# Patient Record
Sex: Female | Born: 1950 | Race: White | Hispanic: No | Smoking: Current some day smoker
Health system: Southern US, Community
[De-identification: ages and names within clinical notes are randomized; demographics above are authoritative.]

## PROBLEM LIST (undated history)

## (undated) DIAGNOSIS — M810 Age-related osteoporosis without current pathological fracture: Secondary | ICD-10-CM

## (undated) DIAGNOSIS — M949 Disorder of cartilage, unspecified: Secondary | ICD-10-CM

## (undated) DIAGNOSIS — R05 Cough: Secondary | ICD-10-CM

## (undated) DIAGNOSIS — K219 Gastro-esophageal reflux disease without esophagitis: Secondary | ICD-10-CM

## (undated) DIAGNOSIS — K589 Irritable bowel syndrome without diarrhea: Secondary | ICD-10-CM

## (undated) DIAGNOSIS — R7302 Impaired glucose tolerance (oral): Secondary | ICD-10-CM

## (undated) DIAGNOSIS — J4489 Other specified chronic obstructive pulmonary disease: Secondary | ICD-10-CM

## (undated) DIAGNOSIS — R059 Cough, unspecified: Secondary | ICD-10-CM

## (undated) DIAGNOSIS — M899 Disorder of bone, unspecified: Secondary | ICD-10-CM

## (undated) DIAGNOSIS — J40 Bronchitis, not specified as acute or chronic: Secondary | ICD-10-CM

## (undated) DIAGNOSIS — M1990 Unspecified osteoarthritis, unspecified site: Secondary | ICD-10-CM

## (undated) DIAGNOSIS — G4733 Obstructive sleep apnea (adult) (pediatric): Secondary | ICD-10-CM

## (undated) DIAGNOSIS — Z860100 Personal history of colon polyps, unspecified: Secondary | ICD-10-CM

## (undated) DIAGNOSIS — F329 Major depressive disorder, single episode, unspecified: Secondary | ICD-10-CM

## (undated) DIAGNOSIS — J189 Pneumonia, unspecified organism: Secondary | ICD-10-CM

## (undated) DIAGNOSIS — R609 Edema, unspecified: Secondary | ICD-10-CM

## (undated) DIAGNOSIS — J309 Allergic rhinitis, unspecified: Secondary | ICD-10-CM

## (undated) DIAGNOSIS — E739 Lactose intolerance, unspecified: Secondary | ICD-10-CM

## (undated) DIAGNOSIS — E785 Hyperlipidemia, unspecified: Secondary | ICD-10-CM

## (undated) DIAGNOSIS — J45909 Unspecified asthma, uncomplicated: Secondary | ICD-10-CM

## (undated) DIAGNOSIS — F3289 Other specified depressive episodes: Secondary | ICD-10-CM

## (undated) DIAGNOSIS — M8580 Other specified disorders of bone density and structure, unspecified site: Secondary | ICD-10-CM

## (undated) DIAGNOSIS — T7840XA Allergy, unspecified, initial encounter: Secondary | ICD-10-CM

## (undated) DIAGNOSIS — F17200 Nicotine dependence, unspecified, uncomplicated: Secondary | ICD-10-CM

## (undated) DIAGNOSIS — H269 Unspecified cataract: Secondary | ICD-10-CM

## (undated) DIAGNOSIS — T8859XA Other complications of anesthesia, initial encounter: Secondary | ICD-10-CM

## (undated) DIAGNOSIS — G4730 Sleep apnea, unspecified: Secondary | ICD-10-CM

## (undated) DIAGNOSIS — K760 Fatty (change of) liver, not elsewhere classified: Secondary | ICD-10-CM

## (undated) DIAGNOSIS — I10 Essential (primary) hypertension: Secondary | ICD-10-CM

## (undated) DIAGNOSIS — R0600 Dyspnea, unspecified: Secondary | ICD-10-CM

## (undated) DIAGNOSIS — F411 Generalized anxiety disorder: Secondary | ICD-10-CM

## (undated) DIAGNOSIS — J449 Chronic obstructive pulmonary disease, unspecified: Secondary | ICD-10-CM

## (undated) HISTORY — DX: Disorder of bone, unspecified: M89.9

## (undated) HISTORY — DX: Essential (primary) hypertension: I10

## (undated) HISTORY — DX: Unspecified asthma, uncomplicated: J45.909

## (undated) HISTORY — DX: Allergic rhinitis, unspecified: J30.9

## (undated) HISTORY — DX: Allergy, unspecified, initial encounter: T78.40XA

## (undated) HISTORY — DX: Impaired glucose tolerance (oral): R73.02

## (undated) HISTORY — PX: OTHER SURGICAL HISTORY: SHX169

## (undated) HISTORY — DX: Hyperlipidemia, unspecified: E78.5

## (undated) HISTORY — DX: Edema, unspecified: R60.9

## (undated) HISTORY — DX: Major depressive disorder, single episode, unspecified: F32.9

## (undated) HISTORY — DX: Other specified chronic obstructive pulmonary disease: J44.89

## (undated) HISTORY — DX: Cough, unspecified: R05.9

## (undated) HISTORY — DX: Gastro-esophageal reflux disease without esophagitis: K21.9

## (undated) HISTORY — DX: Lactose intolerance, unspecified: E73.9

## (undated) HISTORY — DX: Other specified disorders of bone density and structure, unspecified site: M85.80

## (undated) HISTORY — DX: Disorder of bone, unspecified: M94.9

## (undated) HISTORY — DX: Pneumonia, unspecified organism: J18.9

## (undated) HISTORY — DX: Fatty (change of) liver, not elsewhere classified: K76.0

## (undated) HISTORY — DX: Nicotine dependence, unspecified, uncomplicated: F17.200

## (undated) HISTORY — DX: Generalized anxiety disorder: F41.1

## (undated) HISTORY — PX: CHOLECYSTECTOMY: SHX55

## (undated) HISTORY — DX: Obstructive sleep apnea (adult) (pediatric): G47.33

## (undated) HISTORY — DX: Unspecified osteoarthritis, unspecified site: M19.90

## (undated) HISTORY — DX: Bronchitis, not specified as acute or chronic: J40

## (undated) HISTORY — DX: Other specified depressive episodes: F32.89

## (undated) HISTORY — DX: Cough: R05

## (undated) HISTORY — PX: COLONOSCOPY: SHX174

## (undated) HISTORY — DX: Chronic obstructive pulmonary disease, unspecified: J44.9

## (undated) HISTORY — PX: WISDOM TOOTH EXTRACTION: SHX21

## (undated) HISTORY — DX: Irritable bowel syndrome, unspecified: K58.9

## (undated) HISTORY — DX: Personal history of colon polyps, unspecified: Z86.0100

## (undated) HISTORY — DX: Unspecified cataract: H26.9

## (undated) HISTORY — DX: Personal history of colonic polyps: Z86.010

## (undated) HISTORY — DX: Age-related osteoporosis without current pathological fracture: M81.0

## (undated) HISTORY — DX: Sleep apnea, unspecified: G47.30

---

## 1999-04-04 ENCOUNTER — Other Ambulatory Visit: Admission: RE | Admit: 1999-04-04 | Discharge: 1999-04-04 | Payer: Self-pay | Admitting: *Deleted

## 2000-04-10 ENCOUNTER — Other Ambulatory Visit: Admission: RE | Admit: 2000-04-10 | Discharge: 2000-04-10 | Payer: Self-pay | Admitting: *Deleted

## 2004-12-10 ENCOUNTER — Ambulatory Visit: Payer: Self-pay | Admitting: Internal Medicine

## 2005-03-20 ENCOUNTER — Ambulatory Visit: Payer: Self-pay | Admitting: Internal Medicine

## 2005-04-04 ENCOUNTER — Ambulatory Visit: Payer: Self-pay | Admitting: Internal Medicine

## 2005-04-08 ENCOUNTER — Ambulatory Visit (HOSPITAL_COMMUNITY): Admission: RE | Admit: 2005-04-08 | Discharge: 2005-04-08 | Payer: Self-pay | Admitting: Internal Medicine

## 2005-04-10 ENCOUNTER — Ambulatory Visit: Payer: Self-pay | Admitting: Internal Medicine

## 2005-11-06 ENCOUNTER — Ambulatory Visit: Payer: Self-pay | Admitting: Internal Medicine

## 2007-07-23 LAB — HM COLONOSCOPY

## 2007-08-05 ENCOUNTER — Encounter: Admission: RE | Admit: 2007-08-05 | Discharge: 2007-08-05 | Payer: Self-pay | Admitting: Obstetrics and Gynecology

## 2007-08-06 ENCOUNTER — Ambulatory Visit: Payer: Self-pay | Admitting: Internal Medicine

## 2007-08-19 ENCOUNTER — Ambulatory Visit: Payer: Self-pay | Admitting: Internal Medicine

## 2007-08-19 ENCOUNTER — Encounter: Payer: Self-pay | Admitting: Internal Medicine

## 2007-08-25 ENCOUNTER — Encounter: Admission: RE | Admit: 2007-08-25 | Discharge: 2007-08-25 | Payer: Self-pay | Admitting: Obstetrics and Gynecology

## 2007-10-06 ENCOUNTER — Encounter: Payer: Self-pay | Admitting: Internal Medicine

## 2007-12-13 ENCOUNTER — Encounter: Payer: Self-pay | Admitting: Internal Medicine

## 2007-12-13 ENCOUNTER — Ambulatory Visit: Payer: Self-pay | Admitting: Internal Medicine

## 2007-12-13 DIAGNOSIS — Z87891 Personal history of nicotine dependence: Secondary | ICD-10-CM | POA: Insufficient documentation

## 2007-12-13 DIAGNOSIS — J189 Pneumonia, unspecified organism: Secondary | ICD-10-CM | POA: Insufficient documentation

## 2007-12-13 DIAGNOSIS — R059 Cough, unspecified: Secondary | ICD-10-CM | POA: Insufficient documentation

## 2007-12-13 DIAGNOSIS — R05 Cough: Secondary | ICD-10-CM

## 2007-12-13 DIAGNOSIS — J449 Chronic obstructive pulmonary disease, unspecified: Secondary | ICD-10-CM | POA: Insufficient documentation

## 2007-12-13 DIAGNOSIS — F17200 Nicotine dependence, unspecified, uncomplicated: Secondary | ICD-10-CM | POA: Insufficient documentation

## 2007-12-21 ENCOUNTER — Telehealth (INDEPENDENT_AMBULATORY_CARE_PROVIDER_SITE_OTHER): Payer: Self-pay | Admitting: *Deleted

## 2008-05-30 ENCOUNTER — Telehealth: Payer: Self-pay | Admitting: Internal Medicine

## 2008-06-16 ENCOUNTER — Ambulatory Visit: Payer: Self-pay | Admitting: Internal Medicine

## 2008-06-16 ENCOUNTER — Telehealth: Payer: Self-pay | Admitting: Internal Medicine

## 2009-11-22 ENCOUNTER — Encounter: Payer: Self-pay | Admitting: Internal Medicine

## 2009-12-12 ENCOUNTER — Ambulatory Visit: Payer: Self-pay | Admitting: Internal Medicine

## 2009-12-12 LAB — CONVERTED CEMR LAB
AST: 28 units/L (ref 0–37)
BUN: 16 mg/dL (ref 6–23)
Basophils Relative: 0.7 % (ref 0.0–3.0)
Calcium: 9.4 mg/dL (ref 8.4–10.5)
Chloride: 102 meq/L (ref 96–112)
Cholesterol: 204 mg/dL — ABNORMAL HIGH (ref 0–200)
Direct LDL: 88.8 mg/dL
Glucose, Bld: 94 mg/dL (ref 70–99)
HDL: 106.8 mg/dL (ref >39.00)
Leukocytes, UA: NEGATIVE
Lymphocytes Relative: 29.6 % (ref 12.0–46.0)
Lymphs Abs: 2 10*3/uL (ref 0.7–4.0)
MCHC: 33.8 g/dL (ref 30.0–36.0)
Neutro Abs: 3.8 10*3/uL (ref 1.4–7.7)
Neutrophils Relative %: 56.2 % (ref 43.0–77.0)
Nitrite: NEGATIVE
Sodium: 142 meq/L (ref 135–145)
TSH: 0.92 microintl units/mL (ref 0.35–5.50)
Total CHOL/HDL Ratio: 2
Total Protein, Urine: NEGATIVE mg/dL
VLDL: 7.2 mg/dL (ref 0.0–40.0)
WBC: 6.7 10*3/uL (ref 4.5–10.5)

## 2009-12-20 ENCOUNTER — Ambulatory Visit: Payer: Self-pay | Admitting: Internal Medicine

## 2009-12-20 DIAGNOSIS — I10 Essential (primary) hypertension: Secondary | ICD-10-CM | POA: Insufficient documentation

## 2009-12-20 DIAGNOSIS — M899 Disorder of bone, unspecified: Secondary | ICD-10-CM | POA: Insufficient documentation

## 2009-12-20 DIAGNOSIS — F329 Major depressive disorder, single episode, unspecified: Secondary | ICD-10-CM

## 2009-12-20 DIAGNOSIS — F32A Depression, unspecified: Secondary | ICD-10-CM | POA: Insufficient documentation

## 2009-12-20 DIAGNOSIS — R609 Edema, unspecified: Secondary | ICD-10-CM | POA: Insufficient documentation

## 2009-12-20 DIAGNOSIS — J3089 Other allergic rhinitis: Secondary | ICD-10-CM | POA: Insufficient documentation

## 2009-12-20 DIAGNOSIS — M949 Disorder of cartilage, unspecified: Secondary | ICD-10-CM

## 2009-12-20 DIAGNOSIS — K589 Irritable bowel syndrome without diarrhea: Secondary | ICD-10-CM | POA: Insufficient documentation

## 2009-12-20 DIAGNOSIS — Z860100 Personal history of colon polyps, unspecified: Secondary | ICD-10-CM | POA: Insufficient documentation

## 2009-12-20 DIAGNOSIS — K219 Gastro-esophageal reflux disease without esophagitis: Secondary | ICD-10-CM | POA: Insufficient documentation

## 2009-12-20 DIAGNOSIS — E739 Lactose intolerance, unspecified: Secondary | ICD-10-CM | POA: Insufficient documentation

## 2009-12-20 DIAGNOSIS — F411 Generalized anxiety disorder: Secondary | ICD-10-CM | POA: Insufficient documentation

## 2010-11-21 NOTE — Assessment & Plan Note (Signed)
Summary: CPX / NWS  #   Vital Signs:  Patient profile:   60 year old [name] Height:      64 inches Weight:      204.50 pounds BMI:     35.23 O2 Sat:      98 % on Room air Temp:     97.7 degrees F oral Pulse rate:   101 / minute BP sitting:   148 / 90  (left arm) Cuff size:   large  Vitals Entered ByShirlean Mylar Ewing (December 20, 2009 8:49 AM)  O2 Flow:  Room air  Preventive Care Screening  Colonoscopy:    Date:  07/23/2007    Next Due:  07/2012    Results:  Adenomatous Polyp   Pap Smear:    Date:  12/19/2009    Results:  normal   Mammogram:    Date:  11/20/2009    Results:  normal   Last Tetanus Booster:    Date:  12/21/2006    Results:  Historical   Bone Density:    Date:  04/16/2005    Results:  abnormal std dev     decliens flu shot  CC: Adult physical/RE   CC:  Adult physical/RE.  History of Present Illness: Bp has been eleveted recently , 140/98 at GYN yesterday;  c/o marked increased anxiety over work stress and ? losing job ;  denies significant depressive symptoms or suicidal ideation, has near panic but no full blown attack yet;  symptomatic most days of the wk and goes into work on sundays to prepare for the wk;  has problems with constant worry, and some sleep dysfuntion mild as well;  had OSA testing approx 5 yrs ago - neg overall;  has gained some wt  overall seen last about 2 yrs per dr plotnikov dx and tx with pna;  the very same day she found her hsband home deceased from heart disease at home;  wants to quit smoking but hard with anxiety at this point , but willing to consider chantix or other;  Pt denies CP, sob, doe, wheezing, orthopnea, pnd, worsening LE edema, palps, dizziness or syncope   Pt denies new neuro symptoms such as headache, facial or extremity weakness.  Wt up from 189 to 204 since feb 2009, mostlyk due to diet change, now eating different since husband died.     Preventive Screening-Counseling & Management  Alcohol-Tobacco     Smoking  Status: current      Drug Use:  no.    Problems Prior to Update: 1)  Osteopenia  (ICD-733.90) 2)  Glucose Intolerance  (ICD-271.3) 3)  Allergic Rhinitis  (ICD-477.9) 4)  Colonic Polyps, Hx of  (ICD-V12.72) 5)  Depression  (ICD-311) 6)  Ibs  (ICD-564.1) 7)  Gerd  (ICD-530.81) 8)  Peripheral Edema  (ICD-782.3) 9)  Preventive Health Care  (ICD-V70.0) 10)  Hypertension  (ICD-401.9) 11)  Anxiety  (ICD-300.00) 12)  Tobacco Use Disorder/smoker-smoking Cessation Discussed  (ICD-305.1) 13)  COPD  (ICD-496) 14)  Pneumonia, Organism Unspecified  (ICD-486) 15)  Cough  (ICD-786.2)  Medications Prior to Update: 1)  Promethazine-Codeine 6.25-10 Mg/57m  Syrp (Promethazine-Codeine) .... 5-10 Ml Qid As Needed Cough 2)  Albuterol Sulfate 4 Mg  Tb12 (Albuterol Sulfate) ..Marland Kitchen. 1 By Mouth Two Times A Day For Wheezing 3)  Alprazolam 0.5 Mg  Tabs (Alprazolam) ..Marland Kitchen. 1 -2 By Mouth At Bedtime Prn  Current Medications (verified): 1)  Alprazolam 0.5 Mg  Tabs (Alprazolam) ..Marland Kitchen. 1 -2 By  Mouth At Bedtime As Needed 2)  Calcium Carbonate 1500 Mg Tabs (Calcium Carbonate) .Marland Kitchen.. 1 By Mouth Once Daily 3)  Vitamin D 1000 Unit Tabs (Cholecalciferol) .Marland Kitchen.. 1 By Mouth Once Daily 4)  Glucosamine 500 Mg Caps (Glucosamine Sulfate) .Marland Kitchen.. 1 By Mouth Once Daily 5)  Aspir-Low 81 Mg Tbec (Aspirin) .Marland Kitchen.. 1 By Mouth Once Daily 6)  Sertraline Hcl 100 Mg Tabs (Sertraline Hcl) .Marland Kitchen.. 1po Once Daily 7)  Chantix Starting Month Pak 0.5 Mg X 11 & 1 Mg X 42 Tabs (Varenicline Tartrate) .... Use Asd 1 By Mouth Once Daily 8)  Chantix Continuing Month Pak 1 Mg Tabs (Varenicline Tartrate) .... Use Asd 1 By Mouth Once Daily 9)  Hydrochlorothiazide 12.5 Mg Caps (Hydrochlorothiazide) .Marland Kitchen.. 1po Once Daily  Allergies (verified): 1)  ! 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 Family History of Anxiety 2 sister with breast cancer 4 family with hypothyroidism Elevated cholesterol HTN DM  Social History: Reviewed history from 12/13/2007 and no changes required. 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         all otherwise negative per pt -  Physical Exam  General:  alert and overweight-appearing.   Head:  normocephalic and atraumatic.   Eyes:  vision grossly intact, pupils equal, and pupils round.   Ears:  R ear normal and L ear normal.   Nose:  no external deformity and no nasal discharge.   Mouth:  no gingival abnormalities and pharynx pink and moist.   Neck:  supple and no masses.   Lungs:  normal respiratory effort and normal breath sounds.   Heart:  normal rate and regular rhythm.   Abdomen:  soft, non-tender, and normal bowel sounds.   Msk:  no joint tenderness and no joint  swelling.   Extremities:  trace edema bilat, no erythema Neurologic:  cranial nerves II-XII intact and strength normal in all extremities.   Skin:  color normal and no rashes.   Psych:  not depressed appearing and moderately anxious.     Impression & Recommendations:  Problem # 1:  Preventive Health Care (ICD-V70.0)  Overall doing well, age appropriate education and counseling updated and referral for appropriate preventive services done unless declined, immunizations up to date or declined, diet counseling done if overweight, urged to quit smoking if smokes , most recent labs reviewed and current ordered if appropriate, ecg reviewed or declined (interpretation per ECG scanned in the EMR if done); information regarding Medicare Prevention requirements given if appropriate   Orders: EKG w/ Interpretation (93000)  Problem #  2:  ANXIETY (ICD-300.00)  Her updated medication list for this problem includes:    Alprazolam 0.5 Mg Tabs (Alprazolam) .Marland Kitchen... 1 -2 by mouth at bedtime as needed    Sertraline Hcl 100 Mg Tabs (Sertraline hcl) .Marland Kitchen... 1po once daily treat as above, f/u any worsening signs or symptoms   Problem # 3:  TOBACCO USE DISORDER/SMOKER-SMOKING CESSATION DISCUSSED (ICD-305.1)  Her updated medication list for this problem includes:    Chantix Starting Month Pak 0.5 Mg X 11 & 1 Mg X 42 Tabs (Varenicline tartrate) ..... Use asd 1 by mouth once daily    Chantix Continuing Month Pak 1 Mg Tabs (Varenicline tartrate) ..... Use asd 1 by mouth once daily treat as above, f/u any worsening signs or symptoms   Problem # 4:  HYPERTENSION (ICD-401.9)  Her updated medication list for this problem includes:    Hydrochlorothiazide 12.5 Mg Caps (Hydrochlorothiazide) .Marland Kitchen... 1po once daily  BP today: 148/90 Prior BP: 125/75 (12/13/2007)  Labs Reviewed: K+: 4.3 (12/12/2009) Creat: : 0.5 (12/12/2009)   Chol: 204 (12/12/2009)   HDL: 106.80 (12/12/2009)   TG: 36.0 (12/12/2009) treat as above, f/u  any worsening signs or symptoms   Problem # 5:  PERIPHERAL EDEMA (ICD-782.3)  Her updated medication list for this problem includes:    Hydrochlorothiazide 12.5 Mg Caps (Hydrochlorothiazide) .Marland Kitchen... 1po once daily trace today, ongoing for months to years - suspect due to venous insuff - treat as above, f/u any worsening signs or symptoms   Complete Medication List: 1)  Alprazolam 0.5 Mg Tabs (Alprazolam) .Marland Kitchen.. 1 -2 by mouth at bedtime as needed 2)  Calcium Carbonate 1500 Mg Tabs (Calcium carbonate) .Marland Kitchen.. 1 by mouth once daily 3)  Vitamin D 1000 Unit Tabs (Cholecalciferol) .Marland Kitchen.. 1 by mouth once daily 4)  Glucosamine 500 Mg Caps (Glucosamine sulfate) .Marland Kitchen.. 1 by mouth once daily 5)  Aspir-low 81 Mg Tbec (Aspirin) .Marland Kitchen.. 1 by mouth once daily 6)  Sertraline Hcl 100 Mg Tabs (Sertraline hcl) .Marland Kitchen.. 1po once daily 7)  Chantix Starting Month Pak 0.5 Mg X 11 & 1 Mg X 42 Tabs (Varenicline tartrate) .... Use asd 1 by mouth once daily 8)  Chantix Continuing Month Pak 1 Mg Tabs (Varenicline tartrate) .... Use asd 1 by mouth once daily 9)  Hydrochlorothiazide 12.5 Mg Caps (Hydrochlorothiazide) .Marland Kitchen.. 1po once daily  Patient Instructions: 1)  Please take all new medications as prescribed - start the sertraline at HALF pill for 3 days, then whole pill per day after that 2)  Continue all previous medications as before this visit  3)  I checked your chart - the next colonoscopy is due at 5 yrs (2013) 4)  Please schedule a follow-up appointment in 1 year or sooner if needed Prescriptions: HYDROCHLOROTHIAZIDE 12.5 MG CAPS (HYDROCHLOROTHIAZIDE) 1po once daily  #90 x 3   Entered and Authorized by:   Biagio Borg MD   Signed by:   Biagio Borg MD on 12/20/2009   Method used:   Electronically to        [facility]  [phone]* (retail)       [place], [place]  [address]       Ph: [phone] or [phone]       Fax: [phone]   RxID:   [id number] CHANTIX CONTINUING MONTH PAK 1 MG TABS  (VARENICLINE TARTRATE) use asd 1 by mouth once daily  #1pk x 1   Entered and Authorized by:  Biagio Borg MD   Signed by:   Biagio Borg MD on 12/20/2009   Method used:   Print then Give to Patient   RxID:   [id number] [place] PAK 0.5 MG X 11 & 1 MG X 42 TABS (VARENICLINE TARTRATE) use asd 1 by mouth once daily  #1pk x 0   Entered and Authorized by:   Biagio Borg MD   Signed by:   Biagio Borg MD on 12/20/2009   Method used:   Print then Give to Patient   RxID:   [id number] ALPRAZOLAM 0.5 MG  TABS (ALPRAZOLAM) 1 -2 by mouth at bedtime as needed  #60 x 2   Entered and Authorized by:   Biagio Borg MD   Signed by:   Biagio Borg MD on 12/20/2009   Method used:   Print then Give to Patient   RxID:   [id number] SERTRALINE HCL 100 MG TABS (SERTRALINE HCL) 1po once daily  #30 x 11   Entered and Authorized by:   Biagio Borg MD   Signed by:   Biagio Borg MD on 12/20/2009   Method used:   Electronically to        [facility]  [phone]* (retail)       [place], [place]  [address]       Ph: [phone] or [phone]       Fax: [phone]   RxID:   [id number]    Immunization History:  Tetanus/Td Immunization History:    Tetanus/Td:  historical (12/21/2006)

## 2010-12-26 ENCOUNTER — Other Ambulatory Visit: Payer: Self-pay | Admitting: Obstetrics and Gynecology

## 2010-12-30 ENCOUNTER — Telehealth: Payer: Self-pay | Admitting: Internal Medicine

## 2011-01-07 NOTE — Progress Notes (Signed)
Summary: elevated BP  Phone Note Call from Patient Call back at [facility] Phone [phone]   Summary of Call: Pt called stating her BP has been elevated lately 130/85, 167/102, 122/90. Pt had similar elevated reading at GYN and was advised to discuss with [facility] the possiblity of changing or adding to HCTZ. Please advise. Initial call taken by: Crissie Sickles, CMA,  December 30, 2010 1:50 PM  Follow-up for Phone Call        ok to change to lisinopril HCT  10/12.5 - 1 per day - done per emr  to return for LAB only in 2 wks:  bmet  :401.1 Follow-up by: Biagio Borg MD,  December 30, 2010 5:09 PM  Additional Follow-up for Phone Call Additional follow up Details #1::        called pt. left msg. to call back Additional Follow-up by: Robin Ewing CMA Deborra Medina),  December 31, 2010 8:39 AM    Additional Follow-up for Phone Call Additional follow up Details #2::    called pt informed of above information and scheduled pt. for labs 01/14/2011 Follow-up by: Robin Ewing CMA Deborra Medina),  December 31, 2010 8:48 AM  New/Updated Medications: LISINOPRIL-HYDROCHLOROTHIAZIDE 10-12.5 MG TABS (LISINOPRIL-HYDROCHLOROTHIAZIDE) 1 by mouth once daily Prescriptions: LISINOPRIL-HYDROCHLOROTHIAZIDE 10-12.5 MG TABS (LISINOPRIL-HYDROCHLOROTHIAZIDE) 1 by mouth once daily  #90 x 3   Entered and Authorized by:   Biagio Borg MD   Signed by:   Biagio Borg MD on 12/30/2010   Method used:   Electronically to        [facility]  [phone]* (retail)       [place], [place]  [address]       Ph: [phone] or [phone]       Fax: [phone]   RxID:   [id number] LISINOPRIL-HYDROCHLOROTHIAZIDE 10-12.5 MG TABS (LISINOPRIL-HYDROCHLOROTHIAZIDE) 1 by mouth once daily  #90 x 3   Entered and Authorized by:   Biagio Borg MD   Signed by:   Biagio Borg MD on 12/30/2010   Method used:   Print then Give to Patient   RxID:   [id number]

## 2011-01-14 ENCOUNTER — Other Ambulatory Visit (INDEPENDENT_AMBULATORY_CARE_PROVIDER_SITE_OTHER): Payer: Self-pay

## 2011-01-14 DIAGNOSIS — I10 Essential (primary) hypertension: Secondary | ICD-10-CM

## 2011-01-14 LAB — BASIC METABOLIC PANEL
CO2: 30 mEq/L (ref 19–32)
Calcium: 9.5 mg/dL (ref 8.4–10.5)
Chloride: 101 mEq/L (ref 96–112)
Potassium: 4.8 mEq/L (ref 3.5–5.1)

## 2011-03-27 ENCOUNTER — Other Ambulatory Visit (INDEPENDENT_AMBULATORY_CARE_PROVIDER_SITE_OTHER): Payer: PRIVATE HEALTH INSURANCE

## 2011-03-27 ENCOUNTER — Encounter: Payer: Self-pay | Admitting: Internal Medicine

## 2011-03-27 ENCOUNTER — Ambulatory Visit (INDEPENDENT_AMBULATORY_CARE_PROVIDER_SITE_OTHER): Payer: PRIVATE HEALTH INSURANCE | Admitting: Internal Medicine

## 2011-03-27 VITALS — BP 142/98 | HR 100 | Temp 100.0°F | Ht 64.0 in | Wt 210.0 lb

## 2011-03-27 DIAGNOSIS — Z0000 Encounter for general adult medical examination without abnormal findings: Secondary | ICD-10-CM

## 2011-03-27 DIAGNOSIS — Z0001 Encounter for general adult medical examination with abnormal findings: Secondary | ICD-10-CM | POA: Insufficient documentation

## 2011-03-27 DIAGNOSIS — R7309 Other abnormal glucose: Secondary | ICD-10-CM

## 2011-03-27 DIAGNOSIS — R7302 Impaired glucose tolerance (oral): Secondary | ICD-10-CM | POA: Insufficient documentation

## 2011-03-27 DIAGNOSIS — I10 Essential (primary) hypertension: Secondary | ICD-10-CM

## 2011-03-27 LAB — HEPATIC FUNCTION PANEL
ALT: 53 U/L — ABNORMAL HIGH (ref 0–35)
AST: 42 U/L — ABNORMAL HIGH (ref 0–37)
Albumin: 4.3 g/dL (ref 3.5–5.2)
Total Protein: 7.4 g/dL (ref 6.0–8.3)

## 2011-03-27 LAB — CBC WITH DIFFERENTIAL/PLATELET
Basophils Relative: 1 % (ref 0.0–3.0)
Eosinophils Relative: 2.2 % (ref 0.0–5.0)
HCT: 46.2 % — ABNORMAL HIGH (ref 36.0–46.0)
Hemoglobin: 16.1 g/dL — ABNORMAL HIGH (ref 12.0–15.0)
Lymphs Abs: 3 10*3/uL (ref 0.7–4.0)
MCV: 99.7 fl (ref 78.0–100.0)
Monocytes Absolute: 1 10*3/uL (ref 0.1–1.0)
Monocytes Relative: 10.8 % (ref 3.0–12.0)
Neutro Abs: 4.9 10*3/uL (ref 1.4–7.7)
Platelets: 280 10*3/uL (ref 150.0–400.0)
WBC: 9.1 10*3/uL (ref 4.5–10.5)

## 2011-03-27 LAB — BASIC METABOLIC PANEL
Calcium: 9.4 mg/dL (ref 8.4–10.5)
GFR: 119.46 mL/min (ref >60.00)
Glucose, Bld: 119 mg/dL — ABNORMAL HIGH (ref 70–99)
Potassium: 4.3 mEq/L (ref 3.5–5.1)
Sodium: 139 mEq/L (ref 135–145)

## 2011-03-27 LAB — URINALYSIS, ROUTINE W REFLEX MICROSCOPIC
Bilirubin Urine: NEGATIVE
Ketones, ur: NEGATIVE
Leukocytes, UA: NEGATIVE
Total Protein, Urine: NEGATIVE
Urobilinogen, UA: 0.2 (ref 0.0–1.0)
pH: 5.5 (ref 5.0–8.0)

## 2011-03-27 LAB — LIPID PANEL
Cholesterol: 199 mg/dL (ref 0–200)
HDL: 94.2 mg/dL (ref >39.00)
VLDL: 13 mg/dL (ref 0.0–40.0)

## 2011-03-27 LAB — HEMOGLOBIN A1C: Hgb A1c MFr Bld: 5.3 % (ref 4.6–6.5)

## 2011-03-27 MED ORDER — ALPRAZOLAM 0.5 MG PO TABS: ORAL_TABLET | ORAL | Status: DC

## 2011-03-27 MED ORDER — LOSARTAN POTASSIUM-HCTZ 100-25 MG PO TABS: 1.0000 | ORAL_TABLET | Freq: Every day | ORAL | Status: DC

## 2011-03-27 NOTE — Patient Instructions (Addendum)
Stop the HCTZ 12.5 mg per day Start the Losartan-HCT 100/25 mg - 1 per day Continue all other medications as before Please go to LAB in the Basement for the blood and/or urine tests to be done today Please call the phone number [phone] (the [place]) for results of testing in 2-3 days;  When calling, simply dial the number, and when prompted enter the MRN number above (the Medical Record Number) and the # key, then the message should start. Please return in 1 year for your yearly visit, or sooner if needed, with Lab testing done 3-5 days before

## 2011-03-27 NOTE — Progress Notes (Signed)
Subjective:    Patient ID: Crystal [name], female    DOB: 12/31/1950, 60 y.o.   MRN: [id number]  HPI Here for wellness and f/u;  Overall doing ok;  Pt denies CP, worsening SOB, DOE, wheezing, orthopnea, PND, worsening LE edema, palpitations, dizziness or syncope.  Pt denies neurological change such as new Headache, facial or extremity weakness.  Pt denies polydipsia, polyuria, or low sugar symptoms. Pt states overall good compliance with treatment and medications, good tolerability, and trying to follow lower cholesterol diet.  Pt denies worsening depressive symptoms, suicidal ideation or panic. No fever, wt loss, night sweats, loss of appetite, or other constitutional symptoms.  Pt states good ability with ADL's, low fall risk, home safety reviewed and adequate, no significant changes in hearing or vision, and occasionally active with exercise.  Has seen urology, has plan for bladder tack next mo,  And BP has been mostly on the high side, after starting then stopping the lis-hct , and going bacck to hctz 12.5 alone. Does have a BP cuff at home.  Past Medical History  Diagnosis Date  . ALLERGIC RHINITIS 12/20/2009  . ANXIETY 12/20/2009  . COLONIC POLYPS, HX OF 12/20/2009  . COPD 12/13/2007  . Cough 12/13/2007  . DEPRESSION 12/20/2009  . GERD 12/20/2009  . GLUCOSE INTOLERANCE 12/20/2009  . HYPERTENSION 12/20/2009  . IBS 12/20/2009  . OSTEOPENIA 12/20/2009  . PERIPHERAL EDEMA 12/20/2009  . Pneumonia, organism unspecified 12/13/2007  . TOBACCO USE DISORDER/SMOKER-SMOKING CESSATION DISCUSSED 12/13/2007   Past Surgical History  Procedure Date  . Cesarean section   . Cholecystectomy     reports that she has been smoking.  She does not have any smokeless tobacco history on file. She reports that she drinks alcohol. She reports that she does not use illicit drugs. family history includes Anxiety disorder in her other; Cancer in her sister; Diabetes in her other; Hyperlipidemia in her other; and Hypothyroidism in her  other. Allergies  Allergen Reactions  . Ace Inhibitors Cough  . Penicillins    Current Outpatient Prescriptions on File Prior to Visit  Medication Sig Dispense Refill  . aspirin 81 MG tablet Take 81 mg by mouth daily.        . Calcium Carbonate ([employer] CALCIUM) 1500 MG TABS Take by mouth daily.        . Cholecalciferol (VITAMIN D) 1000 UNITS capsule Take 1,000 Units by mouth daily.        . Glucosamine 500 MG CAPS Take by mouth daily.        Marland Kitchen DISCONTD: ALPRAZolam (XANAX) 0.5 MG tablet 1-2 by mouth at bedtime as needed       . DISCONTD: lisinopril-hydrochlorothiazide (PRINZIDE,ZESTORETIC) 10-12.5 MG per tablet Take 1 tablet by mouth daily.        Marland Kitchen DISCONTD: sertraline (ZOLOFT) 100 MG tablet Take 100 mg by mouth daily.        Marland Kitchen DISCONTD: varenicline (CHANTIX CONTINUING MONTH PAK) 1 MG tablet Take 1 mg by mouth daily.        Marland Kitchen DISCONTD: varenicline (CHANTIX STARTING MONTH PAK) 0.5 MG X 11 & 1 MG X 42 tablet Take by mouth daily. Take one 0.26m tablet by mouth once daily for 3 days, then increase to one 0.532mtablet twice daily for 3 days, then increase to one 38m33mablet twice daily.        Review of Systems Review of Systems  Constitutional: Negative for diaphoresis, activity change, appetite change and unexpected weight change.  HENT: Negative  for hearing loss, ear pain, facial swelling, mouth sores and neck stiffness.   Eyes: Negative for pain, redness and visual disturbance.  Respiratory: Negative for shortness of breath and wheezing.   Cardiovascular: Negative for chest pain and palpitations.  Gastrointestinal: Negative for diarrhea, blood in stool, abdominal distention and rectal pain.  Genitourinary: Negative for hematuria, flank pain and decreased urine volume.  Musculoskeletal: Negative for myalgias and joint swelling.  Skin: Negative for color change and wound.  Neurological: Negative for syncope and numbness.  Hematological: Negative for adenopathy.  Psychiatric/Behavioral:  Negative for hallucinations, self-injury, decreased concentration and agitation.       Objective:   Physical Exam BP 142/98  Pulse 100  Temp(Src) 100 F (37.8 C) (Oral)  Ht 5' 4"  (1.626 m)  Wt 210 lb (95.255 kg)  BMI 36.05 kg/m2  SpO2 96% Physical Exam  VS noted Constitutional: Pt is oriented to person, place, and time. Appears well-developed and well-nourished.  HENT:  Head: Normocephalic and atraumatic.  Right Ear: External ear normal.  Left Ear: External ear normal.  Nose: Nose normal.  Mouth/Throat: Oropharynx is clear and moist.  Eyes: Conjunctivae and EOM are normal. Pupils are equal, round, and reactive to light.  Neck: Normal range of motion. Neck supple. No JVD present. No tracheal deviation present.  Cardiovascular: Normal rate, regular rhythm, normal heart sounds and intact distal pulses.   Pulmonary/Chest: Effort normal and breath sounds normal.  Abdominal: Soft. Bowel sounds are normal. There is no tenderness.  Musculoskeletal: Normal range of motion. Exhibits trace -1+ edema bilat ankle Lymphadenopathy:  Has no cervical adenopathy.  Neurological: Pt is alert and oriented to person, place, and time. Pt has normal reflexes. No cranial nerve deficit.  Skin: Skin is warm and dry. No rash noted.  Psychiatric:  Has  normal mood and affect. Behavior is normal.         Assessment & Plan:

## 2011-03-27 NOTE — Assessment & Plan Note (Signed)
Change the hctz 12.5 to losartan hct 100/25 - 1 per day; f/u bp at home and next visit  BP Readings from Last 3 Encounters:  03/27/11 142/98  12/20/09 148/90  12/13/07 125/75

## 2011-03-27 NOTE — Assessment & Plan Note (Signed)
stable overall by hx and exam, most recent data reviewed with pt, and pt to continue medical treatment as before  Lab Results  Component Value Date   HGBA1C 5.3 03/27/2011

## 2011-03-27 NOTE — Assessment & Plan Note (Signed)

## 2011-04-24 ENCOUNTER — Ambulatory Visit (HOSPITAL_BASED_OUTPATIENT_CLINIC_OR_DEPARTMENT_OTHER)
Admission: RE | Admit: 2011-04-24 | Discharge: 2011-04-24 | Disposition: A | Discharge status: Home / Self Care | Payer: PRIVATE HEALTH INSURANCE | Source: Ambulatory Visit | Attending: Urology | Admitting: Urology

## 2011-04-24 DIAGNOSIS — N393 Stress incontinence (female) (male): Secondary | ICD-10-CM | POA: Insufficient documentation

## 2011-04-24 DIAGNOSIS — F17200 Nicotine dependence, unspecified, uncomplicated: Secondary | ICD-10-CM | POA: Insufficient documentation

## 2011-04-24 DIAGNOSIS — Z01810 Encounter for preprocedural cardiovascular examination: Secondary | ICD-10-CM | POA: Insufficient documentation

## 2011-04-24 DIAGNOSIS — Z79899 Other long term (current) drug therapy: Secondary | ICD-10-CM | POA: Insufficient documentation

## 2011-04-24 DIAGNOSIS — Z01812 Encounter for preprocedural laboratory examination: Secondary | ICD-10-CM | POA: Insufficient documentation

## 2011-04-24 LAB — BASIC METABOLIC PANEL
GFR calc Af Amer: >60 mL/min (ref >60)
GFR calc non Af Amer: >60 mL/min (ref >60)
Potassium: 3.4 mEq/L — ABNORMAL LOW (ref 3.5–5.1)
Sodium: 136 mEq/L (ref 135–145)

## 2011-04-24 LAB — CBC
HCT: 44.9 % (ref 36.0–46.0)
Hemoglobin: 15.6 g/dL — ABNORMAL HIGH (ref 12.0–15.0)
MCHC: 34.7 g/dL (ref 30.0–36.0)
MCV: 95.7 fL (ref 78.0–100.0)
RDW: 12.7 % (ref 11.5–15.5)

## 2011-04-24 LAB — TYPE AND SCREEN: Antibody Screen: NEGATIVE

## 2011-04-24 LAB — PROTIME-INR: INR: 0.94 (ref 0.00–1.49)

## 2011-04-24 LAB — APTT: aPTT: 30 seconds (ref 24–37)

## 2011-05-06 NOTE — Op Note (Signed)
  NAMEKELBIE, MORO             ACCOUNT NO.:  [id number]  MEDICAL RECORD NO.:  [id number]  LOCATION:                                 FACILITY:  PHYSICIAN:  Reece Packer, MD DATE OF BIRTH:  11-16-1950  DATE OF PROCEDURE:  04/24/2011 DATE OF DISCHARGE:                              OPERATIVE REPORT   PREOPERATIVE DIAGNOSIS:  Stress incontinence.  POSTOPERATIVE DIAGNOSIS:  Stress incontinence.  PROCEDURE:  Sling cystourethropexy [facility]) plus cystoscopy.  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 She failed medical therapy.  She consented to the above procedure. Preoperative laboratory tests were normal.  Preoperative antibiotics were given.  Extra care was taken with leg positioning to minimize risk compartment syndrome, neuropathy and DVT.  Two 1 cm incisions were made 1 fingerbreadth above the symphysis pubis, 1.5 cm lateral to the midline.  A 2 cm suburethral incision was made appropriate depth after instilling 5 mL of lidocaine-epinephrine mixture.  I dissected sharply to urethrovesical angle bilaterally.  When the bladder emptied, I passed a SPARC needle on top along the back of symphysis pubis staying lateral and turning inward on to the pulp of my index finger.  This was done bilaterally.  My box technique was utilized.  I cystoscoped the patient.  The bladder and urethra were completely normal.  There are excellent blue jets bilaterally.  There was no wiggling of bladder with wiggling of trocar.  With the bladder emptied, I attached the [place] sling and brought up through the retropubic space and tensioned over the fat part of a moderate-sized Kelly clamp.  I cut below the blue dots, irrigated the sheath, and removed the sheath.  I was very happy with the tension. There was no spring back effect.  It was in appropriate position.  All incisions were irrigated.  I closed the anterior vaginal wall with running 2-0 Vicryl followed  by 2 interrupted sutures.  She had a little bit of a narrow vagina, making visibility a little bit more difficult. The sling was cut below the skin and I closed each abdominal incision with interrupted 4-0 Vicryl followed by Dermabond.  Vaginal pack was applied.  Blood loss was less than 50 mL.  Hopefully, this operation will reach Ms. Maple Hudson treatment goal.          ______________________________ Reece Packer, MD     SAM/MEDQ  D:  04/24/2011  T:  04/24/2011  Job:  [id number]  Electronically Signed by Bjorn Loser MD on 05/06/2011 08:14:25 AM

## 2011-09-18 ENCOUNTER — Encounter: Payer: Self-pay | Admitting: Internal Medicine

## 2011-09-18 ENCOUNTER — Ambulatory Visit (INDEPENDENT_AMBULATORY_CARE_PROVIDER_SITE_OTHER): Payer: PRIVATE HEALTH INSURANCE | Admitting: Internal Medicine

## 2011-09-18 VITALS — BP 120/80 | HR 96 | Temp 98.3°F | Ht 64.0 in | Wt 206.8 lb

## 2011-09-18 DIAGNOSIS — R7309 Other abnormal glucose: Secondary | ICD-10-CM

## 2011-09-18 DIAGNOSIS — R7302 Impaired glucose tolerance (oral): Secondary | ICD-10-CM

## 2011-09-18 DIAGNOSIS — J441 Chronic obstructive pulmonary disease with (acute) exacerbation: Secondary | ICD-10-CM | POA: Insufficient documentation

## 2011-09-18 DIAGNOSIS — I10 Essential (primary) hypertension: Secondary | ICD-10-CM

## 2011-09-18 MED ORDER — LOSARTAN POTASSIUM-HCTZ 100-25 MG PO TABS: 1.0000 | ORAL_TABLET | Freq: Every day | ORAL | Status: DC

## 2011-09-18 MED ORDER — ALPRAZOLAM 0.5 MG PO TABS: ORAL_TABLET | ORAL | Status: DC

## 2011-09-18 MED ORDER — HYDROCODONE-HOMATROPINE 5-1.5 MG/5ML PO SYRP: 5.0000 mL | ORAL_SOLUTION | Freq: Four times a day (QID) | ORAL | Status: AC | PRN

## 2011-09-18 MED ORDER — LEVOFLOXACIN 250 MG PO TABS: 250.0000 mg | ORAL_TABLET | Freq: Every day | ORAL | Status: AC

## 2011-09-18 MED ORDER — PREDNISONE 10 MG PO TABS: 10.0000 mg | ORAL_TABLET | Freq: Every day | ORAL | Status: AC

## 2011-09-18 MED ORDER — METHYLPREDNISOLONE ACETATE PF 80 MG/ML IJ SUSP
120.0000 mg | Freq: Once | INTRAMUSCULAR | Status: AC
  Administered 2011-09-18: 120 mg via INTRAMUSCULAR

## 2011-09-18 NOTE — Patient Instructions (Addendum)
You had the steroid shot today Take all new medications as prescribed Continue all other medications as before Please return in June 2013 with Lab testing done 3-5 days before, or sooner if needed

## 2011-09-20 ENCOUNTER — Encounter: Payer: Self-pay | Admitting: Internal Medicine

## 2011-09-20 NOTE — Assessment & Plan Note (Signed)
stable overall by hx and exam, most recent data reviewed with pt, and pt to continue medical treatment as before  Lab Results  Component Value Date   HGBA1C 5.3 03/27/2011   To follow onset polys or cbg> 200

## 2011-09-20 NOTE — Progress Notes (Signed)
Subjective:    Patient ID: Crystal [name], female    DOB: 1951/02/26, 60 y.o.   MRN: [id number]  HPI  Here with acute onset mild to mod 2-3 days general weakness and malaise, with nonprod cough and mild to mod wheezing/sob, but Pt denies chest pain, orthopnea, PND, increased LE swelling, palpitations, dizziness or syncope.Pt denies new neurological symptoms such as new headache, or facial or extremity weakness or numbness   Pt denies polydipsia, polyuria,  Pt states overall good compliance with meds, trying to follow lower cholesterol diet, wt overall stable but little exercise however.    Pt denies fever, wt loss, night sweats, loss of appetite, or other constitutional symptoms Past Medical History  Diagnosis Date  . ALLERGIC RHINITIS 12/20/2009  . ANXIETY 12/20/2009  . COLONIC POLYPS, HX OF 12/20/2009  . COPD 12/13/2007  . Cough 12/13/2007  . DEPRESSION 12/20/2009  . GERD 12/20/2009  . GLUCOSE INTOLERANCE 12/20/2009  . HYPERTENSION 12/20/2009  . IBS 12/20/2009  . OSTEOPENIA 12/20/2009  . PERIPHERAL EDEMA 12/20/2009  . Pneumonia, organism unspecified 12/13/2007  . TOBACCO USE DISORDER/SMOKER-SMOKING CESSATION DISCUSSED 12/13/2007  . Impaired glucose tolerance 03/27/2011   Past Surgical History  Procedure Date  . Cesarean section   . Cholecystectomy     reports that she has been smoking.  She does not have any smokeless tobacco history on file. She reports that she drinks alcohol. She reports that she does not use illicit drugs. family history includes Anxiety disorder in her other; Cancer in her sister; Diabetes in her other; Hyperlipidemia in her other; and Hypothyroidism in her other. Allergies  Allergen Reactions  . Ace Inhibitors Cough  . Penicillins    Current Outpatient Prescriptions on File Prior to Visit  Medication Sig Dispense Refill  . aspirin 81 MG tablet Take 81 mg by mouth daily.        . Calcium Carbonate ([employer] CALCIUM) 1500 MG TABS Take by mouth daily.        . Cholecalciferol (VITAMIN  D) 1000 UNITS capsule Take 1,000 Units by mouth daily.        . folic acid (FOLVITE) 1 MG tablet Take 1 mg by mouth daily.        . Glucosamine 500 MG CAPS Take by mouth daily.         Review of Systems Review of Systems  Constitutional: Negative for diaphoresis and unexpected weight change.  HENT: Negative for drooling and tinnitus.   Eyes: Negative for photophobia and visual disturbance.  Respiratory: Negative for choking and stridor.   Gastrointestinal: Negative for vomiting and blood in stool.  Genitourinary: Negative for hematuria and decreased urine volume.     Objective:   Physical Exam BP 120/80  Pulse 96  Temp(Src) 98.3 F (36.8 C) (Oral)  Ht 5' 4"  (1.626 m)  Wt 206 lb 12 oz (93.781 kg)  BMI 35.49 kg/m2  SpO2 97% Physical Exam  VS noted, uncomfortable but not ill appearing Constitutional: Pt appears well-developed and well-nourished.  HENT: Head: Normocephalic.  Right Ear: External ear normal.  Left Ear: External ear normal.  Bilat tm's mild erythema.  Sinus nontender.  Pharynx mild erythema Eyes: Conjunctivae and EOM are normal. Pupils are equal, round, and reactive to light.  Neck: Normal range of motion. Neck supple.  Cardiovascular: Normal rate and regular rhythm.   Pulmonary/Chest: Effort normal and breath sounds decresed bilat with midl wheeze bilat, no rales.  Neurological: Pt is alert. No cranial nerve deficit.  Skin: Skin is  warm. No erythema.  Psychiatric: Pt behavior is normal. Thought content normal. 1+ nervous    Assessment & Plan:

## 2011-09-20 NOTE — Assessment & Plan Note (Signed)
stable overall by hx and exam, most recent data reviewed with pt, and pt to continue medical treatment as before  BP Readings from Last 3 Encounters:  09/18/11 120/80  03/27/11 142/98  12/20/09 148/90

## 2011-09-20 NOTE — Assessment & Plan Note (Signed)
Mild to mod, for depomedrol and predpack asd,  to f/u any worsening symptoms or concerns

## 2011-12-02 ENCOUNTER — Encounter: Payer: Self-pay | Admitting: Internal Medicine

## 2012-01-01 ENCOUNTER — Encounter: Payer: Self-pay | Admitting: Internal Medicine

## 2012-01-01 ENCOUNTER — Ambulatory Visit (INDEPENDENT_AMBULATORY_CARE_PROVIDER_SITE_OTHER): Payer: PRIVATE HEALTH INSURANCE | Admitting: Internal Medicine

## 2012-01-01 VITALS — BP 110/60 | HR 94 | Temp 98.1°F | Ht 63.0 in | Wt 198.0 lb

## 2012-01-01 DIAGNOSIS — J019 Acute sinusitis, unspecified: Secondary | ICD-10-CM

## 2012-01-01 DIAGNOSIS — J449 Chronic obstructive pulmonary disease, unspecified: Secondary | ICD-10-CM

## 2012-01-01 DIAGNOSIS — I10 Essential (primary) hypertension: Secondary | ICD-10-CM

## 2012-01-01 DIAGNOSIS — J4489 Other specified chronic obstructive pulmonary disease: Secondary | ICD-10-CM

## 2012-01-01 DIAGNOSIS — F411 Generalized anxiety disorder: Secondary | ICD-10-CM

## 2012-01-01 MED ORDER — HYDROCODONE-HOMATROPINE 5-1.5 MG/5ML PO SYRP: 5.0000 mL | ORAL_SOLUTION | Freq: Four times a day (QID) | ORAL | Status: AC | PRN

## 2012-01-01 MED ORDER — LEVOFLOXACIN 250 MG PO TABS: 250.0000 mg | ORAL_TABLET | Freq: Every day | ORAL | Status: AC

## 2012-01-01 NOTE — Patient Instructions (Signed)
Take all new medications as prescribed Continue all other medications as before  

## 2012-01-04 ENCOUNTER — Encounter: Payer: Self-pay | Admitting: Internal Medicine

## 2012-01-04 NOTE — Assessment & Plan Note (Signed)
stable overall by hx and exam, most recent data reviewed with pt, and pt to continue medical treatment as before  Lab Results  Component Value Date   WBC 8.8 04/24/2011   HGB 15.6* 04/24/2011   HCT 44.9 04/24/2011   PLT 264 04/24/2011   GLUCOSE 98 04/24/2011   CHOL 199 03/27/2011   TRIG 65.0 03/27/2011   HDL 94.20 03/27/2011   LDLDIRECT 88.8 12/12/2009   LDLCALC 92 03/27/2011   ALT 53* 03/27/2011   AST 42* 03/27/2011   NA 136 04/24/2011   K 3.4* 04/24/2011   CL 99 04/24/2011   CREATININE 0.54 04/24/2011   BUN 18 04/24/2011   CO2 27 04/24/2011   TSH 1.08 03/27/2011   INR 0.94 04/24/2011   HGBA1C 5.3 03/27/2011

## 2012-01-04 NOTE — Assessment & Plan Note (Signed)
Mild to mod, for antibx course,  to f/u any worsening symptoms or concerns 

## 2012-01-04 NOTE — Assessment & Plan Note (Signed)
stable overall by hx and exam, most recent data reviewed with pt, and pt to continue medical treatment as before  BP Readings from Last 3 Encounters:  01/01/12 110/60  09/18/11 120/80  03/27/11 142/98

## 2012-01-04 NOTE — Assessment & Plan Note (Signed)
stable overall by hx and exam, most recent data reviewed with pt, and pt to continue medical treatment as before SpO2 Readings from Last 3 Encounters:  01/01/12 98%  09/18/11 97%  03/27/11 96%

## 2012-01-04 NOTE — Progress Notes (Signed)
Subjective:    Patient ID: Crystal [name], female    DOB: 08-11-1951, 61 y.o.   MRN: [id number]  HPI  Here with acute onset mild to mod 2-3 days ST, HA, general weakness and malaise, with prod cough greenish sputum, but Pt denies chest pain, increased sob or doe, wheezing, orthopnea, PND, increased LE swelling, palpitations, dizziness or syncope.  Pt denies new neurological symptoms such as new headache, or facial or extremity weakness or numbness   Pt denies polydipsia, polyuria, Pt states overall good compliance with meds, trying to follow lower cholesterol diet, wt overall stable.  Denies worsening depressive symptoms, suicidal ideation, or panic, though has ongoing anxiety, not increased recently.  Past Medical History  Diagnosis Date  . ALLERGIC RHINITIS 12/20/2009  . ANXIETY 12/20/2009  . COLONIC POLYPS, HX OF 12/20/2009  . COPD 12/13/2007  . Cough 12/13/2007  . DEPRESSION 12/20/2009  . GERD 12/20/2009  . GLUCOSE INTOLERANCE 12/20/2009  . HYPERTENSION 12/20/2009  . IBS 12/20/2009  . OSTEOPENIA 12/20/2009  . PERIPHERAL EDEMA 12/20/2009  . Pneumonia, organism unspecified 12/13/2007  . TOBACCO USE DISORDER/SMOKER-SMOKING CESSATION DISCUSSED 12/13/2007  . Impaired glucose tolerance 03/27/2011   Past Surgical History  Procedure Date  . Cesarean section   . Cholecystectomy     reports that she has been smoking.  She does not have any smokeless tobacco history on file. She reports that she drinks alcohol. She reports that she does not use illicit drugs. family history includes Anxiety disorder in her other; Cancer in her sister; Diabetes in her other; Hyperlipidemia in her other; and Hypothyroidism in her other. Allergies  Allergen Reactions  . Ace Inhibitors Cough  . Penicillins    Current Outpatient Prescriptions on File Prior to Visit  Medication Sig Dispense Refill  . ALPRAZolam (XANAX) 0.5 MG tablet 1-2 by mouth at bedtime as needed  60 tablet  2  . aspirin 81 MG tablet Take 81 mg by mouth daily.         . Calcium Carbonate ([employer]S CALCIUM) 1500 MG TABS Take by mouth daily.        . Cholecalciferol (VITAMIN D) 1000 UNITS capsule Take 1,000 Units by mouth daily.        . folic acid (FOLVITE) 1 MG tablet Take 1 mg by mouth daily.        . Glucosamine 500 MG CAPS Take by mouth daily.        Marland Kitchen losartan-hydrochlorothiazide (HYZAAR) 100-25 MG per tablet Take 1 tablet by mouth daily.  90 tablet  3   Review of Systems Review of Systems  Constitutional: Negative for diaphoresis and unexpected weight change.  HENT: Negative for drooling and tinnitus.   Eyes: Negative for photophobia and visual disturbance.  Respiratory: Negative for choking and stridor.   Gastrointestinal: Negative for vomiting and blood in stool.  Genitourinary: Negative for hematuria and decreased urine volume.  Musculoskeletal: Negative for gait problem.        Objective:   Physical Exam BP 110/60  Pulse 94  Temp(Src) 98.1 F (36.7 C) (Oral)  Ht 5' 3"  (1.6 m)  Wt 198 lb (89.812 kg)  BMI 35.07 kg/m2  SpO2 98% Physical Exam  VS noted, mild ill Constitutional: Pt appears well-developed and well-nourished.  HENT: Head: Normocephalic.  Right Ear: External ear normal.  Left Ear: External ear normal.  Bilat tm's mild erythema.  Sinus tender bilat.  Pharynx mild erythema Eyes: Conjunctivae and EOM are normal. Pupils are equal, round, and reactive to light.  Neck: Normal range of motion. Neck supple.  Cardiovascular: Normal rate and regular rhythm.   Pulmonary/Chest: Effort normal and breath sounds normal.  - no rales or wheezing Neurological: Pt is alert. No cranial nerve deficit.  Skin: Skin is warm. No erythema.  Psychiatric: Pt behavior is normal. Thought content normal.     Assessment & Plan:

## 2012-04-07 ENCOUNTER — Telehealth: Payer: Self-pay

## 2012-04-07 DIAGNOSIS — Z0000 Encounter for general adult medical examination without abnormal findings: Secondary | ICD-10-CM

## 2012-04-07 NOTE — Telephone Encounter (Signed)
Put order in for physical labs. 

## 2012-04-19 ENCOUNTER — Other Ambulatory Visit: Payer: Self-pay | Admitting: Internal Medicine

## 2012-05-19 ENCOUNTER — Telehealth: Payer: Self-pay | Admitting: *Deleted

## 2012-05-19 NOTE — Telephone Encounter (Signed)
Confirmed 07/15/12 genetic appt w/ pt.  Laroy Apple at referring to make aware.  Took paperwork to Santiago Glad.

## 2012-05-27 ENCOUNTER — Other Ambulatory Visit (INDEPENDENT_AMBULATORY_CARE_PROVIDER_SITE_OTHER): Payer: PRIVATE HEALTH INSURANCE

## 2012-05-27 DIAGNOSIS — Z0000 Encounter for general adult medical examination without abnormal findings: Secondary | ICD-10-CM

## 2012-05-27 LAB — URINALYSIS, ROUTINE W REFLEX MICROSCOPIC
Bilirubin Urine: NEGATIVE
Hgb urine dipstick: NEGATIVE
Ketones, ur: NEGATIVE
Nitrite: NEGATIVE
Total Protein, Urine: NEGATIVE
Urine Glucose: NEGATIVE

## 2012-05-27 LAB — CBC WITH DIFFERENTIAL/PLATELET
Basophils Absolute: 0.1 10*3/uL (ref 0.0–0.1)
Basophils Relative: 0.7 % (ref 0.0–3.0)
Hemoglobin: 16.6 g/dL — ABNORMAL HIGH (ref 12.0–15.0)
Lymphocytes Relative: 36.6 % (ref 12.0–46.0)
Monocytes Relative: 10 % (ref 3.0–12.0)
Neutro Abs: 3.8 10*3/uL (ref 1.4–7.7)
RBC: 4.89 Mil/uL (ref 3.87–5.11)
WBC: 7.5 10*3/uL (ref 4.5–10.5)

## 2012-05-27 LAB — BASIC METABOLIC PANEL
CO2: 31 mEq/L (ref 19–32)
Chloride: 100 mEq/L (ref 96–112)
Creatinine, Ser: 0.6 mg/dL (ref 0.4–1.2)

## 2012-05-27 LAB — LIPID PANEL
HDL: 102.5 mg/dL (ref >39.00)
Total CHOL/HDL Ratio: 2
VLDL: 7.6 mg/dL (ref 0.0–40.0)

## 2012-05-27 LAB — LDL CHOLESTEROL, DIRECT: Direct LDL: 85.6 mg/dL

## 2012-05-27 LAB — HEPATIC FUNCTION PANEL
ALT: 29 U/L (ref 0–35)
Bilirubin, Direct: 0.1 mg/dL (ref 0.0–0.3)
Total Protein: 7.3 g/dL (ref 6.0–8.3)

## 2012-06-03 ENCOUNTER — Encounter: Payer: Self-pay | Admitting: Internal Medicine

## 2012-06-03 ENCOUNTER — Ambulatory Visit (INDEPENDENT_AMBULATORY_CARE_PROVIDER_SITE_OTHER): Payer: PRIVATE HEALTH INSURANCE | Admitting: Internal Medicine

## 2012-06-03 VITALS — BP 118/78 | HR 103 | Temp 98.5°F | Ht 63.5 in | Wt 179.8 lb

## 2012-06-03 DIAGNOSIS — Z0000 Encounter for general adult medical examination without abnormal findings: Secondary | ICD-10-CM

## 2012-06-03 DIAGNOSIS — M25561 Pain in right knee: Secondary | ICD-10-CM

## 2012-06-03 DIAGNOSIS — M25569 Pain in unspecified knee: Secondary | ICD-10-CM

## 2012-06-03 DIAGNOSIS — Z860100 Personal history of colon polyps, unspecified: Secondary | ICD-10-CM

## 2012-06-03 DIAGNOSIS — Z800 Family history of malignant neoplasm of digestive organs: Secondary | ICD-10-CM

## 2012-06-03 DIAGNOSIS — M25562 Pain in left knee: Secondary | ICD-10-CM | POA: Insufficient documentation

## 2012-06-03 MED ORDER — LOSARTAN POTASSIUM-HCTZ 100-25 MG PO TABS: 1.0000 | ORAL_TABLET | Freq: Every day | ORAL | Status: DC

## 2012-06-03 MED ORDER — ALPRAZOLAM 0.5 MG PO TABS: ORAL_TABLET | ORAL | Status: DC

## 2012-06-03 NOTE — Patient Instructions (Addendum)
Please consider the shingles shot You will be contacted regarding the referral for: colonoscopy You will be contacted regarding the referral for: orthopedic Thanks for the Bone D[facility] information Your medications were refilled today Please stop smoking Please return in 1 year for your yearly visit, or sooner if needed, with Lab testing done 3-5 days before

## 2012-06-03 NOTE — Assessment & Plan Note (Signed)

## 2012-06-03 NOTE — Assessment & Plan Note (Signed)
Mild to mod, for ortho referrals,  to f/u any worsening symptoms or concerns

## 2012-06-03 NOTE — Assessment & Plan Note (Signed)
Per pt, for colonoscopy

## 2012-06-03 NOTE — Progress Notes (Signed)
Subjective:    Patient ID: Crystal [name], female    DOB: 1951-02-24, 61 y.o.   MRN: [id number]  HPI  Here for wellness and f/u;  Overall doing ok;  Pt denies CP, worsening SOB, DOE, wheezing, orthopnea, PND, worsening LE edema, palpitations, dizziness or syncope.  Pt denies neurological change such as new Headache, facial or extremity weakness.  Pt denies polydipsia, polyuria, or low sugar symptoms. Pt states overall good compliance with treatment and medications, good tolerability, and trying to follow lower cholesterol diet.  Pt denies worsening depressive symptoms, suicidal ideation or panic. No fever, night sweats, loss of appetite, or other constitutional symptoms.  Pt states good ability with ADL's, low fall risk, home safety reviewed and adequate, no significant changes in hearing or vision, and occasionally active with exercise.  Recently found that father died with colon cancer, as well as bladder cancer.  Denies worsening depressive symptoms, suicidal ideation, or panic, though has ongoing anxiety.  Needs med refills.  Has lost wt from 202 to current 279 intentionally.  Still smoking, hard to quit.  Recent DXA per GYN with osteopenia - t-score   -2.0,  Has hx of vit d deficiency as well.  Also with recent several months bilat anterior knee pain, intermittent , without swelling, falls or giveaways, asks for ortho referral.  Past Medical History  Diagnosis Date  . ALLERGIC RHINITIS 12/20/2009  . ANXIETY 12/20/2009  . COLONIC POLYPS, HX OF 12/20/2009  . COPD 12/13/2007  . Cough 12/13/2007  . DEPRESSION 12/20/2009  . GERD 12/20/2009  . GLUCOSE INTOLERANCE 12/20/2009  . HYPERTENSION 12/20/2009  . IBS 12/20/2009  . OSTEOPENIA 12/20/2009  . PERIPHERAL EDEMA 12/20/2009  . Pneumonia, organism unspecified 12/13/2007  . TOBACCO USE DISORDER/SMOKER-SMOKING CESSATION DISCUSSED 12/13/2007  . Impaired glucose tolerance 03/27/2011   Past Surgical History  Procedure Date  . Cesarean section   . Cholecystectomy     reports that she has been smoking.  She does not have any smokeless tobacco history on file. She reports that she drinks alcohol. She reports that she does not use illicit drugs. family history includes Anxiety disorder in her other; Cancer in her sister; Diabetes in her other; Hyperlipidemia in her other; and Hypothyroidism in her other. Allergies  Allergen Reactions  . Ace Inhibitors Cough  . Penicillins    Current Outpatient Prescriptions on File Prior to Visit  Medication Sig Dispense Refill  . aspirin 81 MG tablet Take 81 mg by mouth daily.        . Calcium Carbonate (CVS CALCIUM) 1500 MG TABS Take by mouth daily.        . Cholecalciferol (VITAMIN D) 1000 UNITS capsule Take 1,000 Units by mouth daily.        . folic acid (FOLVITE) 1 MG tablet Take 1 mg by mouth daily.        . Glucosamine 500 MG CAPS Take by mouth daily.        Marland Kitchen DISCONTD: losartan-hydrochlorothiazide (HYZAAR) 100-25 MG per tablet Take 1 tablet by mouth daily.  90 tablet  3  . DISCONTD: losartan-hydrochlorothiazide (HYZAAR) 100-25 MG per tablet TAKE 1 TABLET BY MOUTH EVERY DAY  90 tablet  3   Review of Systems Review of Systems  Constitutional: Negative for diaphoresis, activity change, appetite change and unexpected weight change.  HENT: Negative for hearing loss, ear pain, facial swelling, mouth sores and neck stiffness.   Eyes: Negative for pain, redness and visual disturbance.  Respiratory: Negative for shortness of breath  and wheezing.   Cardiovascular: Negative for chest pain and palpitations.  Gastrointestinal: Negative for diarrhea, blood in stool, abdominal distention and rectal pain.  Genitourinary: Negative for hematuria, flank pain and decreased urine volume.  Musculoskeletal: Negative for myalgias and joint swelling.  Skin: Negative for color change and wound.  Neurological: Negative for syncope and numbness.  Hematological: Negative for adenopathy.  Psychiatric/Behavioral: Negative for hallucinations,  self-injury, decreased concentration and agitation.      Objective:   Physical Exam BP 118/78  Pulse 103  Temp 98.5 F (36.9 C) (Oral)  Ht 5' 3.5" (1.613 m)  Wt 179 lb 12 oz (81.534 kg)  BMI 31.34 kg/m2  SpO2 97% Physical Exam  VS noted Constitutional: Pt is oriented to person, place, and time. Appears well-developed and well-nourished.  HENT:  Head: Normocephalic and atraumatic.  Right Ear: External ear normal.  Left Ear: External ear normal.  Nose: Nose normal.  Mouth/Throat: Oropharynx is clear and moist.  Eyes: Conjunctivae and EOM are normal. Pupils are equal, round, and reactive to light.  Neck: Normal range of motion. Neck supple. No JVD present. No tracheal deviation present.  Cardiovascular: Normal rate, regular rhythm, normal heart sounds and intact distal pulses.   Pulmonary/Chest: Effort normal and breath sounds normal.  Abdominal: Soft. Bowel sounds are normal. There is no tenderness.  Musculoskeletal: Normal range of motion. Exhibits no edema.  Lymphadenopathy:  Has no cervical adenopathy.  Bilat knee without effusion, NT, FROM Neurological: Pt is alert and oriented to person, place, and time. Pt has normal reflexes. No cranial nerve deficit.  Skin: Skin is warm and dry. No rash noted.  Psychiatric:  Has  normal mood and affect. Behavior is normal. 1+ nervous    Assessment & Plan:

## 2012-07-12 ENCOUNTER — Telehealth: Payer: Self-pay | Admitting: *Deleted

## 2012-07-12 NOTE — Telephone Encounter (Signed)
Patient called stating that she was not able to make her appt 9/26 and requested for me to cancel and she would call back when she got [place] in the country.  Cancelled 07/15/12 genetic appt as requested.

## 2012-07-13 ENCOUNTER — Telehealth: Payer: Self-pay

## 2012-07-13 NOTE — Telephone Encounter (Signed)
Ok to continue for now if she is not having significant dizziness/lightheaded, weakness or tendency to fall; as this helps with BP

## 2012-07-13 NOTE — Telephone Encounter (Signed)
Patient informed. 

## 2012-07-13 NOTE — Telephone Encounter (Signed)
Pt called stating that she was advised at CPE that her Hgb was elevated and one of the possible causes was dehydration. Pt's current BP medication contains a diuretic and pt is unsure if this could be the cause of any dehydration. Pt is requesting MD advisement - should she use BP medication without diuretic and then have labs to determine if this is possible?

## 2012-07-15 ENCOUNTER — Encounter: Payer: PRIVATE HEALTH INSURANCE | Admitting: Genetic Counselor

## 2012-07-15 ENCOUNTER — Other Ambulatory Visit: Payer: PRIVATE HEALTH INSURANCE | Admitting: Lab

## 2012-07-19 ENCOUNTER — Telehealth: Payer: Self-pay | Admitting: Internal Medicine

## 2012-07-19 NOTE — Telephone Encounter (Signed)
Left a message for patient to call me. 

## 2012-07-20 NOTE — Telephone Encounter (Signed)
Left a message for patient to call me. 

## 2012-07-21 NOTE — Telephone Encounter (Signed)
Left a message for patient to call me. 

## 2012-07-23 NOTE — Telephone Encounter (Signed)
Unable to reach patient and she has not returned my calls.

## 2012-08-09 ENCOUNTER — Encounter: Payer: Self-pay | Admitting: *Deleted

## 2012-08-18 ENCOUNTER — Encounter: Payer: Self-pay | Admitting: Internal Medicine

## 2012-09-10 ENCOUNTER — Encounter: Payer: Self-pay | Admitting: Internal Medicine

## 2012-10-14 ENCOUNTER — Other Ambulatory Visit: Payer: Self-pay | Admitting: Internal Medicine

## 2012-10-14 NOTE — Telephone Encounter (Signed)
Ok to Rf in PCP absence?

## 2012-10-14 NOTE — Telephone Encounter (Signed)
[facility] to refill Crystal [name]

## 2012-10-29 ENCOUNTER — Ambulatory Visit (AMBULATORY_SURGERY_CENTER): Payer: PRIVATE HEALTH INSURANCE | Admitting: *Deleted

## 2012-10-29 VITALS — Ht 63.0 in | Wt 178.6 lb

## 2012-10-29 DIAGNOSIS — Z860100 Personal history of colon polyps, unspecified: Secondary | ICD-10-CM

## 2012-10-29 DIAGNOSIS — Z800 Family history of malignant neoplasm of digestive organs: Secondary | ICD-10-CM

## 2012-10-29 DIAGNOSIS — Z1211 Encounter for screening for malignant neoplasm of colon: Secondary | ICD-10-CM

## 2012-10-29 MED ORDER — PEG-KCL-NACL-NASULF-NA ASC-C 100 G PO SOLR: ORAL | Status: DC

## 2012-10-29 NOTE — Progress Notes (Signed)
No allergy to eggs or soy products

## 2012-11-12 ENCOUNTER — Encounter: Payer: PRIVATE HEALTH INSURANCE | Admitting: Internal Medicine

## 2012-12-22 ENCOUNTER — Encounter: Payer: Self-pay | Admitting: Internal Medicine

## 2012-12-22 ENCOUNTER — Ambulatory Visit (AMBULATORY_SURGERY_CENTER): Payer: BC Managed Care – PPO | Admitting: Internal Medicine

## 2012-12-22 VITALS — BP 143/84 | HR 86 | Temp 99.0°F | Resp 21 | Ht 63.0 in | Wt 178.0 lb

## 2012-12-22 DIAGNOSIS — D126 Benign neoplasm of colon, unspecified: Secondary | ICD-10-CM

## 2012-12-22 DIAGNOSIS — Z86010 Personal history of colonic polyps: Secondary | ICD-10-CM

## 2012-12-22 DIAGNOSIS — Z800 Family history of malignant neoplasm of digestive organs: Secondary | ICD-10-CM

## 2012-12-22 DIAGNOSIS — Z1211 Encounter for screening for malignant neoplasm of colon: Secondary | ICD-10-CM

## 2012-12-22 MED ORDER — SODIUM CHLORIDE 0.9 % IV SOLN: 500.0000 mL | INTRAVENOUS | Status: DC

## 2012-12-22 NOTE — Progress Notes (Signed)
PACU RN given report, vss, bbs=clear

## 2012-12-22 NOTE — Op Note (Signed)
[place]  [address] [place], [address]   COLONOSCOPY PROCEDURE REPORT  PATIENT: Crystal, [name]  MR#: [id number] BIRTHDATE: [birth year] , 63  yrs. old GENDER: Female ENDOSCOPIST: Lafayette Dragon, MD REFERRED BY:  recall colon PROCEDURE DATE:  12/22/2012 PROCEDURE:   Colonoscopy with cold biopsy polypectomy and Colonoscopy with snare polypectomy ASA CLASS:   Class I INDICATIONS:High risk patient with personal history of colonic polyps, Patient's immediate family history of colon cancer, and hyperpl polyp 1995, no polyps 2003, adenom.  polyp 2008, new F hx of colon cancer father. MEDICATIONS: MAC sedation, administered by CRNA and Propofol (Diprivan) 580 mg IV  DESCRIPTION OF PROCEDURE:   After the risks and benefits and of the procedure were explained, informed consent was obtained.  A digital rectal exam revealed no abnormalities of the rectum.    The LB [id number]  endoscope was introduced through the anus and advanced to the cecum, which was identified by both the appendix and ileocecal valve .  The quality of the prep was good, using MoviPrep .  The instrument was then slowly withdrawn as the colon was fully examined.     COLON FINDINGS: Four smooth sessile polyps ranging between 3-55m in size were found throughout the entire examined colon.  A polypectomy was performed with cold forceps, with a cold snare and using snare cautery.  The resection was complete and the polyp tissue was completely retrieved.   There was mild diverticulosis noted in the sigmoid colon with associated tortuosity, muscular hypertrophy and angulation.   Small internal hemorrhoids were found.     Retroflexed views revealed no abnormalities.     The scope was then withdrawn from the patient and the procedure completed.  COMPLICATIONS: There were no complications. ENDOSCOPIC IMPRESSION: 1.   Four sessile polyps ranging between 3-712min size were found throughout  the entire examined colon; polypectomy was performed with cold forceps, with a cold snare and using snare cautery 2.   There was mild diverticulosis noted in the sigmoid colon 3.   Small internal hemorrhoids  RECOMMENDATIONS: Await pathology results   REPEAT EXAM: In 3 year(s)  for Colonoscopy.  cc:  _______________________________ eSigned: Lafayette DragonMD 12/22/2012 9:10 AM     PATIENT NAME:  Crystal [name], NicoleR#: [id number]

## 2012-12-22 NOTE — Progress Notes (Signed)
Patient did not experience any of the following events: a burn prior to discharge; a fall within the facility; wrong site/side/patient/procedure/implant event; or a hospital transfer or hospital admission upon discharge from the facility. (G8907) Patient did not have preoperative order for IV antibiotic SSI prophylaxis. (G8918)  

## 2012-12-22 NOTE — Progress Notes (Signed)
Called to room to assist during endoscopic procedure.  Patient ID and intended procedure confirmed with present staff. Received instructions for my participation in the procedure from the performing physician. ewm 

## 2012-12-22 NOTE — Patient Instructions (Addendum)
YOU HAD AN ENDOSCOPIC PROCEDURE TODAY AT [facility]: Refer to the procedure report that was given to you for any specific questions about what was found during the examination.  If the procedure report does not answer your questions, please call your gastroenterologist to clarify.  If you requested that your care partner not be given the details of your procedure findings, then the procedure report has been included in a sealed envelope for you to review at your convenience later.  YOU SHOULD EXPECT: Some feelings of bloating in the abdomen. Passage of more gas than usual.  Walking can help get rid of the air that was put into your GI tract during the procedure and reduce the bloating. If you had a lower endoscopy (such as a colonoscopy or flexible sigmoidoscopy) you may notice spotting of blood in your stool or on the toilet paper. If you underwent a bowel prep for your procedure, then you may not have a normal bowel movement for a few days.  DIET: Your first meal following the procedure should be a light meal and then it is ok to progress to your normal diet.  A half-sandwich or bowl of soup is an example of a good first meal.  Heavy or fried foods are harder to digest and may make you feel nauseous or bloated.  Likewise meals heavy in dairy and vegetables can cause extra gas to form and this can also increase the bloating.  Drink plenty of fluids but you should avoid alcoholic beverages for 24 hours.  ACTIVITY: Your care partner should take you home directly after the procedure.  You should plan to take it easy, moving slowly for the rest of the day.  You can resume normal activity the day after the procedure however you should NOT DRIVE or use heavy machinery for 24 hours (because of the sedation medicines used during the test).    SYMPTOMS TO REPORT IMMEDIATELY: A gastroenterologist can be reached at any hour.  During normal business hours, 8:30 AM to 5:00 PM Monday through Friday,  call [phone].  After hours and on weekends, please call the GI answering service at [phone] who will take a message and have the physician on call contact you.   Following lower endoscopy (colonoscopy or flexible sigmoidoscopy):  Excessive amounts of blood in the stool  Significant tenderness or worsening of abdominal pains  Swelling of the abdomen that is new, acute  Fever of 100F or higher  FOLLOW UP: If any biopsies were taken you will be contacted by phone or by letter within the next 1-3 weeks.  Call your gastroenterologist if you have not heard about the biopsies in 3 weeks.  Our staff will call the home number listed on your records the next business day following your procedure to check on you and address any questions or concerns that you may have at that time regarding the information given to you following your procedure. This is a courtesy call and so if there is no answer at the home number and we have not heard from you through the emergency physician on call, we will assume that you have returned to your regular daily activities without incident.  SIGNATURES/CONFIDENTIALITY: You and/or your care partner have signed paperwork which will be entered into your electronic medical record.  These signatures attest to the fact that that the information above on your After Visit Summary has been reviewed and is understood.  Full responsibility of the confidentiality of this  discharge information lies with you and/or your care-partner.  Diverticulosis-handout given  Hemorrhoids-handout given  Polyps-handout given  High Fiber Diet-handout given  Wait pathology  Repeat colonoscopy in 3 years

## 2012-12-23 ENCOUNTER — Telehealth: Payer: Self-pay | Admitting: *Deleted

## 2012-12-23 NOTE — Telephone Encounter (Signed)
Name identifier, left message, follow-up

## 2012-12-27 ENCOUNTER — Telehealth: Payer: Self-pay | Admitting: Internal Medicine

## 2012-12-27 ENCOUNTER — Encounter: Payer: Self-pay | Admitting: Internal Medicine

## 2012-12-27 MED ORDER — LOSARTAN POTASSIUM-HCTZ 50-12.5 MG PO TABS: 1.0000 | ORAL_TABLET | Freq: Every day | ORAL | Status: DC

## 2012-12-27 NOTE — Telephone Encounter (Signed)
Patient informed of MD instructions and will call back at convenience once aware of her class schedule.

## 2012-12-27 NOTE — Telephone Encounter (Signed)
Patient Information:  Caller Name: Crystal [name]  Phone: [phone]  Patient: Crystal [name], Crystal [name]  Gender: Female  DOB: 11-05-1950  Age: 62 Years  PCP: Cathlean Cower (Adults only)  Office Follow Up:  Does the office need to follow up with this patient?: No  Instructions For The Office: N/A  RN Note:  RN research outcome of initial triage done on patient this morning noting that Dr. Jenny Reichmann had responded --reducing the Losartan-HCTZ and [facility] [address].  He also gave instructions to continue to monitor BP at home and to schedule an OV for 2-3 weeks.  This information was given to patient who demonstrated her understand.  She will call back for appt. when RN offered to transfer her to scheduler.  Caller demonstrated understanding of information give.  Symptoms  Reason For Call & Symptoms: Had spoken to a nurse this morning who was going to try to work her in via message through Epic to office since no appointments were available.  Patient has had some space open on her schedule so she is trying to see if she can come in,  Reviewed Health History In EMR: N/A  Reviewed Medications In EMR: N/A  Reviewed Allergies In EMR: N/A  Reviewed Surgeries / Procedures: N/A  Date of Onset of Symptoms: Unknown  Guideline(s) Used:  No Protocol Available - Information Only  Disposition Per Guideline:   Home Care  Reason For Disposition Reached:   Information only question and nurse able to answer  Advice Given:  Call Back If:  You become worse.

## 2012-12-27 NOTE — Telephone Encounter (Signed)
Patient Information:  Caller Name: [place]  Phone: [phone]  Patient: Crystal [name], Crystal [name]  Gender: Female  DOB: 1950-11-15  Age: 62 Years  PCP: Cathlean Cower (Adults only)  Office Follow Up:  Does the office need to follow up with this patient?: Yes  Instructions For The Office: Next available appontment at 13:15; caller refused due to hair appointment this afternoon.  She states she would be able to come after 14:15- she will be approx 5 min away at that time.  Please follow up with her regarding this.  Thank you.   Symptoms  Reason For Call & Symptoms: Wt. loss 45 Lbs over 2  years with lifestyle changes.  Has been on BP meds and advised to report any dizzy spells.  Asks if Hyzaar dose can be reduced/ cut in half and she monitor BP.  BP this am @ home   134/81 BP reported, Pulse 77.  Relates she did not take Hyzaar on 12/26/12 due to dizziness on3/8/14.  Relates heart racing when she feels dizzy; denies current symptoms.  Reviewed Health History In EMR: Yes  Reviewed Medications In EMR: Yes  Reviewed Allergies In EMR: Yes  Reviewed Surgeries / Procedures: Yes  Date of Onset of Symptoms: 12/08/2012  Guideline(s) Used:  Dizziness  Disposition Per Guideline:   Go to ED Now (or to Office with PCP Approval)  Reason For Disposition Reached:   Extra heart beats OR irregular heart beating (i.e., "palpitations")  Advice Given:  N/A

## 2012-12-27 NOTE — Telephone Encounter (Signed)
Ok to reduce the hyzaar - done erx  Should have OV in 2-3 wks, cont to monitor BP at home and any symptoms

## 2013-01-18 ENCOUNTER — Ambulatory Visit: Payer: BC Managed Care – PPO | Admitting: Internal Medicine

## 2013-01-19 ENCOUNTER — Ambulatory Visit (INDEPENDENT_AMBULATORY_CARE_PROVIDER_SITE_OTHER): Payer: BC Managed Care – PPO | Admitting: Internal Medicine

## 2013-01-19 ENCOUNTER — Other Ambulatory Visit (INDEPENDENT_AMBULATORY_CARE_PROVIDER_SITE_OTHER): Payer: BC Managed Care – PPO

## 2013-01-19 ENCOUNTER — Ambulatory Visit: Payer: BC Managed Care – PPO | Admitting: Internal Medicine

## 2013-01-19 ENCOUNTER — Encounter: Payer: Self-pay | Admitting: Internal Medicine

## 2013-01-19 VITALS — BP 128/80 | HR 99 | Temp 97.6°F | Ht 63.5 in | Wt 173.0 lb

## 2013-01-19 DIAGNOSIS — I10 Essential (primary) hypertension: Secondary | ICD-10-CM

## 2013-01-19 DIAGNOSIS — R7309 Other abnormal glucose: Secondary | ICD-10-CM

## 2013-01-19 DIAGNOSIS — R7302 Impaired glucose tolerance (oral): Secondary | ICD-10-CM

## 2013-01-19 DIAGNOSIS — IMO0001 Reserved for inherently not codable concepts without codable children: Secondary | ICD-10-CM

## 2013-01-19 DIAGNOSIS — J309 Allergic rhinitis, unspecified: Secondary | ICD-10-CM

## 2013-01-19 LAB — LIPID PANEL
HDL: 107.6 mg/dL (ref >39.00)
Total CHOL/HDL Ratio: 2
Triglycerides: 47 mg/dL (ref 0.0–149.0)

## 2013-01-19 LAB — CBC WITH DIFFERENTIAL/PLATELET
Basophils Relative: 0.6 % (ref 0.0–3.0)
HCT: 47.5 % — ABNORMAL HIGH (ref 36.0–46.0)
Hemoglobin: 16.1 g/dL — ABNORMAL HIGH (ref 12.0–15.0)
Lymphocytes Relative: 31.5 % (ref 12.0–46.0)
Monocytes Relative: 10.1 % (ref 3.0–12.0)
Neutro Abs: 4.2 10*3/uL (ref 1.4–7.7)
RBC: 4.83 Mil/uL (ref 3.87–5.11)

## 2013-01-19 LAB — BASIC METABOLIC PANEL
CO2: 31 mEq/L (ref 19–32)
Calcium: 9.7 mg/dL (ref 8.4–10.5)
GFR: 88.44 mL/min (ref >60.00)
Potassium: 4.8 mEq/L (ref 3.5–5.1)
Sodium: 137 mEq/L (ref 135–145)

## 2013-01-19 NOTE — Patient Instructions (Addendum)
Please continue all other medications as before, and refills have been done if requested. Please go to the LAB in the Basement (turn left off the elevator) for the tests to be done today You will be contacted by phone if any changes need to be made immediately.  Otherwise, you will receive a letter about your results with an explanation, but please check with MyChart first. Thank you for enrolling in [place]. Please follow the instructions below to securely access your online medical record. MyChart allows you to send messages to your doctor, view your test results, renew your prescriptions, schedule appointments, and more. To Log into My Chart online, please go by [employer] or [employer] to [email or web address], or download the MyChart App from the [employer].  Your Username is: [email or web address] (pass  [place]) Please send a practice Message on Mychart later today. Please return in 6 months, or sooner if needed, with Lab testing done 3-5 days before

## 2013-01-19 NOTE — Assessment & Plan Note (Signed)
stable overall by history and exam, recent data reviewed with pt, and pt to continue medical treatment as before,  to f/u any worsening symptoms or concerns BP Readings from Last 3 Encounters:  01/19/13 128/80  12/22/12 143/84  06/03/12 118/78

## 2013-01-19 NOTE — Assessment & Plan Note (Signed)
stable overall by history and exam, recent data reviewed with pt, and pt to continue medical treatment as before,  to f/u any worsening symptoms or concerns Lab Results  Component Value Date   HGBA1C 5.3 03/27/2011

## 2013-01-19 NOTE — Progress Notes (Signed)
Subjective:    Patient ID: Crystal [name], female    DOB: 08/16/1951, 62 y.o.   MRN: [id number]  HPI  Here to f/u; overall doing ok,  Pt denies chest pain, increased sob or doe, wheezing, orthopnea, PND, increased LE swelling, palpitations, dizziness or syncope.  Pt denies polydipsia, polyuria, or low sugar symptoms such as weakness or confusion improved with po intake.  Pt denies new neurological symptoms such as new headache, or facial or extremity weakness or numbness.   Pt states overall good compliance with meds, has been trying to follow lower cholesterol, diabetic diet, with wt overall stable,  but little exercise however.  Does have mild bilat ear pressure right > left with worsening allergy symptoms in the past week, wihtout hearing loss or vertigo.  No fever, cough, ST or signficant sinus symptoms.  Using claritin, does not want nasal spray Past Medical History  Diagnosis Date  . ALLERGIC RHINITIS   . ANXIETY   . COLONIC POLYPS, HX OF   . COPD   . Cough   . DEPRESSION   . GERD   . GLUCOSE INTOLERANCE   . HYPERTENSION   . IBS   . OSTEOPENIA   . PERIPHERAL EDEMA   . Pneumonia, organism unspecified   . TOBACCO USE DISORDER/SMOKER-SMOKING CESSATION DISCUSSED   . Impaired glucose tolerance   . Arthritis   . Cataract   . Osteopenia   . Bronchitis    Past Surgical History  Procedure Laterality Date  . Cesarean section    . Cholecystectomy    . Birthmark removed      from back as a child  . Bladder mesh      2012  . Wisdom tooth extraction      reports that she has been smoking.  She has never used smokeless tobacco. She reports that she drinks about 7.0 ounces of alcohol per week. She reports that she does not use illicit drugs. family history includes Anxiety disorder in an unspecified family member; Breast cancer in her sister; Colon cancer in her father and paternal grandfather; Diabetes in an unspecified family member; Hyperlipidemia in an unspecified family member; and  Hypothyroidism in an unspecified family member.  There is no history of Esophageal cancer, and Stomach cancer, and Rectal cancer, . Allergies  Allergen Reactions  . Ace Inhibitors Cough  . Penicillins Rash   Current Outpatient Prescriptions on File Prior to Visit  Medication Sig Dispense Refill  . ALPRAZolam (XANAX) 0.5 MG tablet TAKE 1 TO 2 TABLETS BY MOUTH AT BEDTIME AS NEEDED  60 tablet  2  . aspirin 81 MG tablet Take 81 mg by mouth daily.        . Calcium Carbonate ([facility] CALCIUM) 1500 MG TABS Take by mouth daily.        . Cholecalciferol (VITAMIN D) 1000 UNITS capsule Take 1,000 Units by mouth daily.        . folic acid (FOLVITE) 1 MG tablet Take 1 mg by mouth daily.        Marland Kitchen loratadine (CLARITIN) 10 MG tablet Take 10 mg by mouth daily.      Marland Kitchen losartan-hydrochlorothiazide (HYZAAR) 50-12.5 MG per tablet Take 1 tablet by mouth daily.  90 tablet  3   No current facility-administered medications on file prior to visit.   Review of Systems  Constitutional: Negative for unexpected weight change, or unusual diaphoresis  HENT: Negative for tinnitus.   Eyes: Negative for photophobia and visual disturbance.  Respiratory: Negative  for choking and stridor.   Gastrointestinal: Negative for vomiting and blood in stool.  Genitourinary: Negative for hematuria and decreased urine volume.  Musculoskeletal: Negative for acute joint swelling Skin: Negative for color change and wound.  Neurological: Negative for tremors and numbness other than noted  Psychiatric/Behavioral: Negative for decreased concentration or  hyperactivity.  '    Objective:   Physical Exam BP 128/80  Pulse 99  Temp(Src) 97.6 F (36.4 C) (Oral)  Ht 5' 3.5" (1.613 m)  Wt 173 lb (78.472 kg)  BMI 30.16 kg/m2  SpO2 97% VS noted,  Constitutional: Pt appears well-developed and well-nourished.  HENT: Head: NCAT.  Right Ear: External ear normal.  Left Ear: External ear normal.  Eyes: Conjunctivae and EOM are normal. Pupils are  equal, round, and reactive to light.  Neck: Normal range of motion. Neck supple.  Bilat tm's with mild erythema.  Max sinus areas non tender.  Pharynx with mild erythema, no exudate Cardiovascular: Normal rate and regular rhythm.   Pulmonary/Chest: Effort normal and breath sounds normal.  Abd:  Soft, NT, non-distended, + BS Neurological: Pt is alert. Not confused  Skin: Skin is warm. No erythema.  Psychiatric: Pt behavior is normal. Thought content normal.     Assessment & Plan:

## 2013-01-19 NOTE — Assessment & Plan Note (Signed)
Ok to cont the claritin, declines flonase, ok for mucinex otc prn as well

## 2013-01-24 ENCOUNTER — Other Ambulatory Visit: Payer: Self-pay

## 2013-01-24 MED ORDER — LOSARTAN POTASSIUM-HCTZ 50-12.5 MG PO TABS: 1.0000 | ORAL_TABLET | Freq: Every day | ORAL | Status: DC

## 2013-04-13 ENCOUNTER — Other Ambulatory Visit: Payer: Self-pay | Admitting: Internal Medicine

## 2013-04-13 NOTE — Telephone Encounter (Signed)
Rx faxed to [facility].

## 2013-04-25 ENCOUNTER — Encounter: Payer: Self-pay | Admitting: Internal Medicine

## 2013-04-25 ENCOUNTER — Ambulatory Visit (INDEPENDENT_AMBULATORY_CARE_PROVIDER_SITE_OTHER): Payer: BC Managed Care – PPO | Admitting: Internal Medicine

## 2013-04-25 VITALS — BP 130/82 | HR 86 | Temp 98.4°F | Ht 63.5 in | Wt 180.0 lb

## 2013-04-25 DIAGNOSIS — H6690 Otitis media, unspecified, unspecified ear: Secondary | ICD-10-CM

## 2013-04-25 DIAGNOSIS — J309 Allergic rhinitis, unspecified: Secondary | ICD-10-CM

## 2013-04-25 DIAGNOSIS — I10 Essential (primary) hypertension: Secondary | ICD-10-CM

## 2013-04-25 DIAGNOSIS — H6692 Otitis media, unspecified, left ear: Secondary | ICD-10-CM

## 2013-04-25 MED ORDER — LEVOFLOXACIN 250 MG PO TABS: 250.0000 mg | ORAL_TABLET | Freq: Every day | ORAL | Status: DC

## 2013-04-25 MED ORDER — FLUTICASONE PROPIONATE 50 MCG/ACT NA SUSP: 2.0000 | Freq: Every day | NASAL | Status: DC

## 2013-04-25 NOTE — Assessment & Plan Note (Signed)
Mild to mod, for antibx course,  to f/u any worsening symptoms or concerns 

## 2013-04-25 NOTE — Assessment & Plan Note (Signed)
stable overall by history and exam, recent data reviewed with pt, and pt to continue medical treatment as before,  to f/u any worsening symptoms or concerns BP Readings from Last 3 Encounters:  04/25/13 130/82  01/19/13 128/80  12/22/12 143/84

## 2013-04-25 NOTE — Patient Instructions (Signed)
Please take all new medication as prescribed - the antibiotic, and the flonase nasal spary Please continue all other medications as before, and refills have been done if requested. You can also take Mucinex (or it's generic off brand) for congestion, and tylenol as needed for pain.  Please remember to sign up for My Chart if you have not done so, as this will be important to you in the future with finding out test results, communicating by private email, and scheduling acute appointments online when needed.

## 2013-04-25 NOTE — Assessment & Plan Note (Signed)
Mild to mod, for flonase asd course,  to f/u any worsening symptoms or concerns

## 2013-04-25 NOTE — Progress Notes (Signed)
Subjective:    Patient ID: Crystal [name], female    DOB: [birth year], 62 y.o.   MRN: [id number]  HPI   Here with 2-3 days acute onset fever, left ear pain, pressure, headache, general weakness and malaise, with mild ST and cough, but pt denies chest pain, wheezing, increased sob or doe, orthopnea, PND, increased LE swelling, palpitations, dizziness or syncope. Pt denies new neurological symptoms such as new headache, or facial or extremity weakness or numbness, no vertigo, tinnitus or hearing loss.   Pt denies polydipsia, polyuria  Does have several wks ongoing nasal allergy symptoms with clearish congestion, itch and sneezing, without fever, pain, ST, cough, swelling or wheezing Past Medical History  Diagnosis Date  . ALLERGIC RHINITIS   . ANXIETY   . COLONIC POLYPS, HX OF   . COPD   . Cough   . DEPRESSION   . GERD   . GLUCOSE INTOLERANCE   . HYPERTENSION   . IBS   . OSTEOPENIA   . PERIPHERAL EDEMA   . Pneumonia, organism unspecified   . TOBACCO USE DISORDER/SMOKER-SMOKING CESSATION DISCUSSED   . Impaired glucose tolerance   . Arthritis   . Cataract   . Osteopenia   . Bronchitis    Past Surgical History  Procedure Laterality Date  . Cesarean section    . Cholecystectomy    . Birthmark removed      from back as a child  . Bladder mesh      2012  . Wisdom tooth extraction      reports that she has been smoking.  She has never used smokeless tobacco. She reports that she drinks about 7.0 ounces of alcohol per week. She reports that she does not use illicit drugs. family history includes Anxiety disorder in an unspecified family member; Breast cancer in her sister; Colon cancer in her father and paternal grandfather; Diabetes in an unspecified family member; Hyperlipidemia in an unspecified family member; and Hypothyroidism in an unspecified family member.  There is no history of Esophageal cancer, and Stomach cancer, and Rectal cancer, . Allergies  Allergen Reactions  . Ace  Inhibitors Cough  . Penicillins Rash   . Current Outpatient Prescriptions on File Prior to Visit  Medication Sig Dispense Refill  . ALPRAZolam (XANAX) 0.5 MG tablet TAKE 1 TO TABLETS BY MOUTH AT BEDTIME AS NEEDED  60 tablet  1  . aspirin 81 MG tablet Take 81 mg by mouth daily.        . Calcium Carbonate ([facility] CALCIUM) 1500 MG TABS Take by mouth daily.        . Cholecalciferol (VITAMIN D) 1000 UNITS capsule Take 1,000 Units by mouth daily.        . folic acid (FOLVITE) 1 MG tablet Take 1 mg by mouth daily.        Marland Kitchen loratadine (CLARITIN) 10 MG tablet Take 10 mg by mouth daily.      Marland Kitchen losartan-hydrochlorothiazide (HYZAAR) 50-12.5 MG per tablet Take 1 tablet by mouth daily.  90 tablet  3   No current facility-administered medications on file prior to visit.    Review of Systems  Constitutional: Negative for unexpected weight change, or unusual diaphoresis  HENT: Negative for tinnitus.   Eyes: Negative for photophobia and visual disturbance.  Respiratory: Negative for choking and stridor.   Gastrointestinal: Negative for vomiting and blood in stool.  Genitourinary: Negative for hematuria and decreased urine volume.  Musculoskeletal: Negative for acute joint swelling Skin: Negative for color  change and wound.  Neurological: Negative for tremors and numbness other than noted  Psychiatric/Behavioral: Negative for decreased concentration or  hyperactivity.       Objective:   Physical Exam BP 130/82  Pulse 86  Temp(Src) 98.4 F (36.9 C) (Oral)  Ht 5' 3.5" (1.613 m)  Wt 180 lb (81.647 kg)  BMI 31.38 kg/m2  SpO2 97% VS noted,  Constitutional: Pt appears well-developed and well-nourished.  HENT: Head: NCAT.  Right Ear: External ear normal.  Left Ear: External ear normal.  Left tm's with severe erythema, right mild only.  Max sinus areas non tender.  Pharynx with mild erythema, no exudate Eyes: Conjunctivae and EOM are normal. Pupils are equal, round, and reactive to light.  Neck:  Normal range of motion. Neck supple.  Cardiovascular: Normal rate and regular rhythm.   Pulmonary/Chest: Effort normal and breath sounds normal.  - no rales or wheezing Neurological: Pt is alert. Not confused  Skin: Skin is warm. No erythema.  Psychiatric: Pt behavior is normal. Thought content normal.     Assessment & Plan:

## 2013-05-04 ENCOUNTER — Telehealth: Payer: Self-pay | Admitting: *Deleted

## 2013-05-04 MED ORDER — AZITHROMYCIN 250 MG PO TABS: ORAL_TABLET | ORAL | Status: DC

## 2013-05-04 NOTE — Telephone Encounter (Signed)
The patient has been taking mucinex the entire time she has been sick.  She states this happened about 20 yrs ago and she had to take a second round of antibiotics to completely clear it up

## 2013-05-04 NOTE — Telephone Encounter (Signed)
Ok done erx 

## 2013-05-04 NOTE — Telephone Encounter (Signed)
If no fever, I would try Mucinex OTC and/or low dose sudafed if tolerable, as this can help clear up the remaining congestion

## 2013-05-04 NOTE — Addendum Note (Signed)
Addended by: Biagio Borg on: 05/04/2013 05:15 PM   Modules accepted: Orders, Medications

## 2013-05-04 NOTE — Telephone Encounter (Signed)
Called left msg. To call back

## 2013-05-04 NOTE — Telephone Encounter (Signed)
Pt called states she has completed her regiment of Antibiotics but her ear infection hasn't completely cleared up.  Does she need more antibiotics.  Please advise.

## 2013-05-05 NOTE — Telephone Encounter (Signed)
Called the patient informed rx sent in.

## 2013-07-01 ENCOUNTER — Other Ambulatory Visit (INDEPENDENT_AMBULATORY_CARE_PROVIDER_SITE_OTHER): Payer: BC Managed Care – PPO

## 2013-07-01 DIAGNOSIS — IMO0001 Reserved for inherently not codable concepts without codable children: Secondary | ICD-10-CM

## 2013-07-01 LAB — URINALYSIS, ROUTINE W REFLEX MICROSCOPIC
Bilirubin Urine: NEGATIVE
Ketones, ur: NEGATIVE
Nitrite: NEGATIVE
Total Protein, Urine: NEGATIVE
pH: 6 (ref 5.0–8.0)

## 2013-07-01 LAB — BASIC METABOLIC PANEL
BUN: 13 mg/dL (ref 6–23)
Creatinine, Ser: 0.6 mg/dL (ref 0.4–1.2)
GFR: 103.27 mL/min (ref >60.00)
Potassium: 4.8 mEq/L (ref 3.5–5.1)

## 2013-07-01 LAB — LIPID PANEL
Cholesterol: 212 mg/dL — ABNORMAL HIGH (ref 0–200)
HDL: 112.4 mg/dL (ref >39.00)
Total CHOL/HDL Ratio: 2
VLDL: 7.4 mg/dL (ref 0.0–40.0)

## 2013-07-01 LAB — CBC WITH DIFFERENTIAL/PLATELET
Basophils Relative: 0.8 % (ref 0.0–3.0)
Eosinophils Relative: 3.2 % (ref 0.0–5.0)
HCT: 46.7 % — ABNORMAL HIGH (ref 36.0–46.0)
Lymphs Abs: 2.8 10*3/uL (ref 0.7–4.0)
MCV: 98.2 fl (ref 78.0–100.0)
Monocytes Absolute: 0.9 10*3/uL (ref 0.1–1.0)
Neutro Abs: 3.7 10*3/uL (ref 1.4–7.7)
RBC: 4.76 Mil/uL (ref 3.87–5.11)
WBC: 7.8 10*3/uL (ref 4.5–10.5)

## 2013-07-01 LAB — HEPATIC FUNCTION PANEL: Total Bilirubin: 0.8 mg/dL (ref 0.3–1.2)

## 2013-07-07 ENCOUNTER — Ambulatory Visit (INDEPENDENT_AMBULATORY_CARE_PROVIDER_SITE_OTHER): Payer: BC Managed Care – PPO | Admitting: Internal Medicine

## 2013-07-07 ENCOUNTER — Encounter: Payer: Self-pay | Admitting: Internal Medicine

## 2013-07-07 VITALS — BP 110/80 | HR 109 | Temp 97.0°F | Ht 63.0 in | Wt 175.8 lb

## 2013-07-07 DIAGNOSIS — F17200 Nicotine dependence, unspecified, uncomplicated: Secondary | ICD-10-CM

## 2013-07-07 DIAGNOSIS — Z0000 Encounter for general adult medical examination without abnormal findings: Secondary | ICD-10-CM

## 2013-07-07 MED ORDER — ALPRAZOLAM 0.5 MG PO TABS: ORAL_TABLET | ORAL | Status: DC

## 2013-07-07 MED ORDER — CYCLOBENZAPRINE HCL 5 MG PO TABS: 5.0000 mg | ORAL_TABLET | Freq: Three times a day (TID) | ORAL | Status: DC | PRN

## 2013-07-07 NOTE — Progress Notes (Signed)
Subjective:    Patient ID: Crystal [name], female    DOB: 06/06/1951, 62 y.o.   MRN: [id number]  HPI    /Here for wellness and f/u;  Overall doing ok;  Pt denies CP, worsening SOB, DOE, wheezing, orthopnea, PND, worsening LE edema, palpitations, dizziness or syncope.  Pt denies neurological change such as new headache, facial or extremity weakness.  Pt denies polydipsia, polyuria, or low sugar symptoms. Pt states overall good compliance with treatment and medications, good tolerability, and has been trying to follow lower cholesterol diet.  Pt denies worsening depressive symptoms, suicidal ideation or panic. No fever, night sweats, wt loss, loss of appetite, or other constitutional symptoms.  Pt states good ability with ADL's, has low fall risk, home safety reviewed and adequate, no other significant changes in hearing or vision, and only occasionally active with exercise.  Has seen Dr Leigh Aurora for knees but not recently, has severe right patellar cartilage loss, ongoing pain, hard to be more active.  Widowed, works at home.  Lost overall about 50 lbs in past 3 yrs. Stoill smoking, but sleep apnea testing last about 7 yrs ago neg.  Started new job, does not want eval now.  Past Medical History  Diagnosis Date  . ALLERGIC RHINITIS   . ANXIETY   . COLONIC POLYPS, HX OF   . COPD   . Cough   . DEPRESSION   . GERD   . GLUCOSE INTOLERANCE   . HYPERTENSION   . IBS   . OSTEOPENIA   . PERIPHERAL EDEMA   . Pneumonia, organism unspecified   . TOBACCO USE DISORDER/SMOKER-SMOKING CESSATION DISCUSSED   . Impaired glucose tolerance   . Arthritis   . Cataract   . Osteopenia   . Bronchitis    Past Surgical History  Procedure Laterality Date  . Cesarean section    . Cholecystectomy    . Birthmark removed      from back as a child  . Bladder mesh      2012  . Wisdom tooth extraction      reports that she has been smoking.  She has never used smokeless tobacco. She reports that she drinks  about 7.0 ounces of alcohol per week. She reports that she does not use illicit drugs. family history includes Anxiety disorder in an other family member; Breast cancer in her sister; Colon cancer in her father and paternal grandfather; Diabetes in an other family member; Hyperlipidemia in an other family member; Hypothyroidism in an other family member. There is no history of Esophageal cancer, Stomach cancer, or Rectal cancer. Allergies  Allergen Reactions  . Ace Inhibitors Cough  . Penicillins Rash   Current Outpatient Prescriptions on File Prior to Visit  Medication Sig Dispense Refill  . ALPRAZolam (XANAX) 0.5 MG tablet TAKE 1 TO TABLETS BY MOUTH AT BEDTIME AS NEEDED  60 tablet  1  . aspirin 81 MG tablet Take 81 mg by mouth daily.        . Calcium Carbonate ([facility] CALCIUM) 1500 MG TABS Take by mouth daily.        . Cholecalciferol (VITAMIN D) 1000 UNITS capsule Take 1,000 Units by mouth daily.        . fluticasone (FLONASE) 50 MCG/ACT nasal spray Place 2 sprays into the nose daily.  16 g  2  . folic acid (FOLVITE) 1 MG tablet Take 1 mg by mouth daily.        Marland Kitchen loratadine (CLARITIN) 10 MG tablet Take  10 mg by mouth daily.      Marland Kitchen losartan-hydrochlorothiazide (HYZAAR) 50-12.5 MG per tablet Take 1 tablet by mouth daily.  90 tablet  3   No current facility-administered medications on file prior to visit.    Review of Systems Constitutional: Negative for diaphoresis, activity change, appetite change or unexpected weight change.  HENT: Negative for hearing loss, ear pain, facial swelling, mouth sores and neck stiffness.   Eyes: Negative for pain, redness and visual disturbance.  Respiratory: Negative for shortness of breath and wheezing.   Cardiovascular: Negative for chest pain and palpitations.  Gastrointestinal: Negative for diarrhea, blood in stool, abdominal distention or other pain Genitourinary: Negative for hematuria, flank pain or change in urine volume.  Musculoskeletal: Negative  for myalgias and joint swelling.  Skin: Negative for color change and wound.  Neurological: Negative for syncope and numbness. other than noted Hematological: Negative for adenopathy.  Psychiatric/Behavioral: Negative for hallucinations, self-injury, decreased concentration and agitation.      Objective:   Physical Exam BP 110/80  Pulse 109  Temp(Src) 97 F (36.1 C) (Oral)  Ht 5' 3"  (1.6 m)  Wt 175 lb 12 oz (79.72 kg)  BMI 31.14 kg/m2  SpO2 94% VS noted,  Constitutional: Pt is oriented to person, place, and time. Appears well-developed and well-nourished.  Head: Normocephalic and atraumatic.  Right Ear: External ear normal.  Left Ear: External ear normal.  Nose: Nose normal.  Mouth/Throat: Oropharynx is clear and moist.  Eyes: Conjunctivae and EOM are normal. Pupils are equal, round, and reactive to light.  Neck: Normal range of motion. Neck supple. No JVD present. No tracheal deviation present.  Cardiovascular: Normal rate, regular rhythm, normal heart sounds and intact distal pulses.   Pulmonary/Chest: Effort normal and breath sounds normal.  Abdominal: Soft. Bowel sounds are normal. There is no tenderness. No HSM  Musculoskeletal: Normal range of motion. Exhibits no edema.  Lymphadenopathy:  Has no cervical adenopathy.  Neurological: Pt is alert and oriented to person, place, and time. Pt has normal reflexes. No cranial nerve deficit.  Skin: Skin is warm and dry. No rash noted.  Psychiatric:  Has  normal mood and affect. Behavior is normal.     Assessment & Plan:

## 2013-07-07 NOTE — Assessment & Plan Note (Signed)
Done today.

## 2013-07-07 NOTE — Patient Instructions (Signed)
No new treatment needed today Please continue all other medications as before, and refills have been done if requested - the flexeril and the xanax Please have the pharmacy call with any other refills you may need. Please return if you change your mind about the flu and shingles shots Please stop smoking Please continue your efforts at being more active, low cholesterol diet, and weight control. You are otherwise up to date with prevention measures today. Your EKG was OK today You are given the lab results today  Please remember to sign up for My Chart if you have not done so, as this will be important to you in the future with finding out test results, communicating by private email, and scheduling acute appointments online when needed.  Please return in 1 year for your yearly visit, or sooner if needed, with Lab testing done 3-5 days before

## 2013-07-07 NOTE — Assessment & Plan Note (Signed)

## 2013-08-25 ENCOUNTER — Other Ambulatory Visit: Payer: Self-pay

## 2013-09-22 ENCOUNTER — Ambulatory Visit (INDEPENDENT_AMBULATORY_CARE_PROVIDER_SITE_OTHER): Payer: BC Managed Care – PPO | Admitting: Nurse Practitioner

## 2013-09-22 ENCOUNTER — Encounter: Payer: Self-pay | Admitting: Nurse Practitioner

## 2013-09-22 VITALS — BP 124/70 | HR 92 | Temp 98.2°F | Ht 63.5 in | Wt 173.0 lb

## 2013-09-22 DIAGNOSIS — R059 Cough, unspecified: Secondary | ICD-10-CM

## 2013-09-22 DIAGNOSIS — R05 Cough: Secondary | ICD-10-CM

## 2013-09-22 MED ORDER — BENZONATATE 100 MG PO CAPS: ORAL_CAPSULE | ORAL | Status: DC

## 2013-09-22 MED ORDER — GUAIFENESIN-CODEINE 300-10 MG/5ML PO LIQD: ORAL | Status: DC

## 2013-09-22 NOTE — Progress Notes (Signed)
   Subjective:    Patient ID: Crystal [name], female    DOB: [birth year], 62 y.o.   MRN: [id number]  Cough This is a new problem. The current episode started yesterday. The problem has been gradually worsening. The cough is productive of sputum. Associated symptoms include headaches and nasal congestion. Pertinent negatives include no chest pain, chills, ear congestion, ear pain, eye redness, fever, sore throat, shortness of breath, sweats or wheezing. Exacerbated by: deep inspiration, talking. Treatments tried: mucinex. Her past medical history is significant for COPD, environmental allergies and pneumonia.      Review of Systems  Constitutional: Negative for fever, chills, activity change, appetite change and fatigue.  HENT: Positive for congestion and voice change (hoarse). Negative for ear pain and sore throat.   Eyes: Negative for redness.  Respiratory: Positive for cough. Negative for shortness of breath and wheezing.   Cardiovascular: Negative for chest pain.  Gastrointestinal: Positive for nausea, vomiting and diarrhea. Negative for abdominal pain.       All GI symptoms occurred for 24 hrs 5 da. All symptoms resolved.  Musculoskeletal: Negative for back pain.  Skin: Negative for color change.  Allergic/Immunologic: Positive for environmental allergies.  Neurological: Positive for headaches.  Hematological: Negative for adenopathy.       Objective:   Physical Exam  Vitals reviewed. Constitutional: She is oriented to person, place, and time. She appears well-developed and well-nourished. No distress.  HENT:  Head: Normocephalic and atraumatic.  Mouth/Throat: Oropharynx is clear and moist. No oropharyngeal exudate.  Eyes: Conjunctivae are normal. Right eye exhibits no discharge. Left eye exhibits no discharge.  Neck: No thyromegaly present.  Cardiovascular: Normal rate and regular rhythm.   No murmur heard. Pulmonary/Chest: Effort normal and breath sounds normal. No  respiratory distress. She has no wheezes.  Frequent coughing during exam.  Lymphadenopathy:    She has no cervical adenopathy.  Neurological: She is alert and oriented to person, place, and time.  Skin: Skin is warm and dry.  Psychiatric: She has a normal mood and affect. Her behavior is normal. Thought content normal.          Assessment & Plan:  1. Cough Likely viral bronchitis. Hx COPD, not using inhalers.  - benzonatate (TESSALON) 100 MG capsule; Take 1-2 capsules po tid prn cough.  Dispense: 60 capsule; Refill: 0 - Guaifenesin-Codeine 300-10 MG/5ML LIQD; Take 1 tsp po qhs prn cough  Dispense: 1 Bottle; Refill: 0 See pt instructions.

## 2013-09-22 NOTE — Progress Notes (Signed)
Pre-visit discussion using our clinic review tool. No additional management support is needed unless otherwise documented below in the visit note.  

## 2013-09-22 NOTE — Patient Instructions (Signed)
You likely have a viral bronchitis. The treatment is symptom management: use cough syrup at night for rest, capsules during day to decrease coughing. Warm tea & honey are helpful for cough. Rest. Sip fluids. If you develop fever, fatigue, or chest pain with inspiration please call us for re-evaluation. Feel better!  Bronchitis Bronchitis is the body's way of reacting to injury and/or infection (inflammation) of the bronchi. Bronchi are the air tubes that extend from the windpipe into the lungs. If the inflammation becomes severe, it may cause shortness of breath. CAUSES  Inflammation may be caused by:  A virus.  Germs (bacteria).  Dust.  Allergens.  Pollutants and many other irritants. The cells lining the bronchial tree are covered with tiny hairs (cilia). These constantly beat upward, away from the lungs, toward the mouth. This keeps the lungs free of pollutants. When these cells become too irritated and are unable to do their job, mucus begins to develop. This causes the characteristic cough of bronchitis. The cough clears the lungs when the cilia are unable to do their job. Without either of these protective mechanisms, the mucus would settle in the lungs. Then you would develop pneumonia. Smoking is a common cause of bronchitis and can contribute to pneumonia. Stopping this habit is the single most important thing you can do to help yourself. TREATMENT   Your caregiver may prescribe an antibiotic if the cough is caused by bacteria. Also, medicines that open up your airways make it easier to breathe. Your caregiver may also recommend or prescribe an expectorant. It will loosen the mucus to be coughed up. Only take over-the-counter or prescription medicines for pain, discomfort, or fever as directed by your caregiver.  Removing whatever causes the problem (smoking, for example) is critical to preventing the problem from getting worse.  Cough suppressants may be prescribed for relief of  cough symptoms.  Inhaled medicines may be prescribed to help with symptoms now and to help prevent problems from returning.  For those with recurrent (chronic) bronchitis, there may be a need for steroid medicines. SEEK IMMEDIATE MEDICAL CARE IF:   During treatment, you develop more pus-like mucus (purulent sputum).  You have a fever.  You become progressively more ill.  You have increased difficulty breathing, wheezing, or shortness of breath. It is necessary to seek immediate medical care if you are elderly or sick from any other disease. MAKE SURE YOU:   Understand these instructions.  Will watch your condition.  Will get help right away if you are not doing well or get worse. Document Released: 10/06/2005 Document Revised: 06/08/2013 Document Reviewed: 05/31/2013 [facility] Patient Information 2014 [place].

## 2013-11-17 ENCOUNTER — Emergency Department (HOSPITAL_COMMUNITY)
Admission: EM | Admit: 2013-11-17 | Discharge: 2013-11-17 | Disposition: A | Discharge status: Home / Self Care | Payer: BC Managed Care – PPO | Attending: Emergency Medicine | Admitting: Emergency Medicine

## 2013-11-17 ENCOUNTER — Encounter (HOSPITAL_COMMUNITY): Payer: Self-pay | Admitting: Emergency Medicine

## 2013-11-17 DIAGNOSIS — Z79899 Other long term (current) drug therapy: Secondary | ICD-10-CM | POA: Insufficient documentation

## 2013-11-17 DIAGNOSIS — F411 Generalized anxiety disorder: Secondary | ICD-10-CM | POA: Insufficient documentation

## 2013-11-17 DIAGNOSIS — M129 Arthropathy, unspecified: Secondary | ICD-10-CM | POA: Insufficient documentation

## 2013-11-17 DIAGNOSIS — Z8669 Personal history of other diseases of the nervous system and sense organs: Secondary | ICD-10-CM | POA: Insufficient documentation

## 2013-11-17 DIAGNOSIS — M949 Disorder of cartilage, unspecified: Secondary | ICD-10-CM

## 2013-11-17 DIAGNOSIS — F3289 Other specified depressive episodes: Secondary | ICD-10-CM | POA: Insufficient documentation

## 2013-11-17 DIAGNOSIS — Z880 Allergy status to penicillin: Secondary | ICD-10-CM | POA: Insufficient documentation

## 2013-11-17 DIAGNOSIS — Z860100 Personal history of colon polyps, unspecified: Secondary | ICD-10-CM | POA: Insufficient documentation

## 2013-11-17 DIAGNOSIS — Z8701 Personal history of pneumonia (recurrent): Secondary | ICD-10-CM | POA: Insufficient documentation

## 2013-11-17 DIAGNOSIS — J449 Chronic obstructive pulmonary disease, unspecified: Secondary | ICD-10-CM | POA: Insufficient documentation

## 2013-11-17 DIAGNOSIS — J4489 Other specified chronic obstructive pulmonary disease: Secondary | ICD-10-CM | POA: Insufficient documentation

## 2013-11-17 DIAGNOSIS — F329 Major depressive disorder, single episode, unspecified: Secondary | ICD-10-CM | POA: Insufficient documentation

## 2013-11-17 DIAGNOSIS — R040 Epistaxis: Secondary | ICD-10-CM

## 2013-11-17 DIAGNOSIS — M899 Disorder of bone, unspecified: Secondary | ICD-10-CM | POA: Insufficient documentation

## 2013-11-17 DIAGNOSIS — K219 Gastro-esophageal reflux disease without esophagitis: Secondary | ICD-10-CM | POA: Insufficient documentation

## 2013-11-17 DIAGNOSIS — F17200 Nicotine dependence, unspecified, uncomplicated: Secondary | ICD-10-CM | POA: Insufficient documentation

## 2013-11-17 DIAGNOSIS — I10 Essential (primary) hypertension: Secondary | ICD-10-CM | POA: Insufficient documentation

## 2013-11-17 NOTE — ED Notes (Signed)
Pt states she went out on the porch to have a cigarette and her nose started bleeding  Pt states this was around 2100  Bleeding had eased but restarted in triage

## 2013-11-17 NOTE — Discharge Instructions (Signed)
Please read and follow all provided instructions.  Your diagnoses today include:  1. Epistaxis     Tests performed today include:  Vital signs. See below for your results today.   Medications prescribed:   None  Home care instructions:  Read the educational materials provided and follow any instructions contained in this packet.  If your nosebleed happens again: Pinch your nose and hold for 15 minutes without letting go.  If this does not stop the bleeding, pinch and hold for another 15 minutes.  If it continues to bleed after this, please come to the Emergency Department or see your family doctor.   Follow-up instructions: Please follow-up with your primary care provider in the next 3 days for further evaluation of your symptoms. If you do not have a primary care doctor -- see below for referral information.   Return instructions:   Please return to the Emergency Department if you experience worsening symptoms.   Please return if you have any other emergent concerns.  Additional Information:  Your vital signs today were: BP 137/71   Pulse 91   Temp(Src) 97.5 F (36.4 C) (Oral)   Resp 18   Ht 5' 3"  (1.6 m)   Wt 173 lb (78.472 kg)   BMI 30.65 kg/m2   SpO2 97% If your blood pressure (BP) was elevated above 135/85 this visit, please have this repeated by your doctor within one month. --------------

## 2013-11-17 NOTE — ED Notes (Signed)
Pt is c/o headache and some dizziness

## 2013-11-17 NOTE — ED Provider Notes (Signed)
CSN: [id number]     Arrival date & time 11/17/13  2126 History   First MD Initiated Contact with Patient 11/17/13 2128     Chief Complaint  Patient presents with  . Epistaxis  . Hypertension   (Consider location/radiation/quality/duration/timing/severity/associated sxs/prior Treatment) HPI Comments: Patient with history of hypertension presents with complaint of nose bleed. Patient states that her nose began bleeding out of the left nostril very briskly at approximately 9 PM while she was smoking a cigarette. Patient applied pressure at home but the bleeding did not stop. Patient's son arrived and patient was transported to the hospital. Upon arrival the bleeding slowed and stopped. No other treatments prior to arrival. Patient does not have a history of nosebleeds. She is on aspirin as needed (none today) but no other blood thinners. She denies blood in her stool, blood in urine, easy bruising, other symptoms of bleeding. No recent upper respiratory tract infection symptoms. Patient states that she checked her blood pressure at home and was elevated. The onset of this condition was acute. The course is resolved. Aggravating factors: none. Alleviating factors: none.    The history is provided by the patient.    Past Medical History  Diagnosis Date  . ALLERGIC RHINITIS   . ANXIETY   . COLONIC POLYPS, HX OF   . COPD   . Cough   . DEPRESSION   . GERD   . GLUCOSE INTOLERANCE   . HYPERTENSION   . IBS   . OSTEOPENIA   . PERIPHERAL EDEMA   . Pneumonia, organism unspecified   . TOBACCO USE DISORDER/SMOKER-SMOKING CESSATION DISCUSSED   . Impaired glucose tolerance   . Arthritis   . Cataract   . Osteopenia   . Bronchitis    Past Surgical History  Procedure Laterality Date  . Cesarean section    . Cholecystectomy    . Birthmark removed      from back as a child  . Bladder mesh      2012  . Wisdom tooth extraction     Family History  Problem Relation Age of Onset  . Breast cancer  Sister     x 2  . Anxiety disorder    . Hypothyroidism    . Hyperlipidemia    . Diabetes    . Colon cancer Father   . Colon cancer Paternal Grandfather   . Esophageal cancer Neg Hx   . Stomach cancer Neg Hx   . Rectal cancer Neg Hx    History  Substance Use Topics  . Smoking status: Current Every Day Smoker -- 0.50 packs/day for .5 years  . Smokeless tobacco: Never Used  . Alcohol Use: 7.0 oz/week    14 drink(s) per week     Comment: couple drinks every night   OB History   Grav Para Term Preterm Abortions TAB SAB Ect Mult Living                 Review of Systems  Constitutional: Negative for fever and fatigue.  HENT: Positive for nosebleeds. Negative for congestion, ear pain, rhinorrhea, sinus pressure and sore throat.   Respiratory: Negative for cough.   Neurological: Negative for headaches.    Allergies  Ace inhibitors and Penicillins  Home Medications   Current Outpatient Rx  Name  Route  Sig  Dispense  Refill  . ALPRAZolam (XANAX) 0.5 MG tablet      TAKE 1 TO TABLETS BY MOUTH AT BEDTIME AS NEEDED   60 tablet  2   . aspirin 325 MG tablet   Oral   Take 325 mg by mouth every 4 (four) hours as needed for mild pain.         . Calcium Carbonate ([employer]S CALCIUM) 1500 MG TABS   Oral   Take by mouth daily.           . Cholecalciferol (VITAMIN D) 1000 UNITS capsule   Oral   Take 1,000 Units by mouth daily.           . cyclobenzaprine (FLEXERIL) 5 MG tablet   Oral   Take 1 tablet (5 mg total) by mouth 3 (three) times daily as needed for muscle spasms.   30 tablet   2   . folic acid (FOLVITE) 1 MG tablet   Oral   Take 1 mg by mouth daily.           Marland Kitchen loratadine (CLARITIN) 10 MG tablet   Oral   Take 10 mg by mouth daily as needed.          Marland Kitchen losartan-hydrochlorothiazide (HYZAAR) 50-12.5 MG per tablet   Oral   Take 1 tablet by mouth daily.   90 tablet   3    BP 137/71  Pulse 91  Temp(Src) 97.5 F (36.4 C) (Oral)  Resp 18  Ht 5' 3"  (1.6  m)  Wt 173 lb (78.472 kg)  BMI 30.65 kg/m2  SpO2 97% Physical Exam  Nursing note and vitals reviewed. Constitutional: She appears well-developed and well-nourished.  HENT:  Head: Normocephalic and atraumatic.  Nose: Epistaxis (No active bleeding, clots noted bilateral nares, source of bleeding appears to be left septum) is observed.  Eyes: Conjunctivae are normal.  Neck: Normal range of motion. Neck supple.  Pulmonary/Chest: No respiratory distress.  Neurological: She is alert.  Skin: Skin is warm and dry.  Psychiatric: She has a normal mood and affect.    ED Course  Procedures (including critical care time) Labs Review Labs Reviewed - No data to display Imaging Review No results found.  EKG Interpretation   None      Patient seen and examined.   Vital signs reviewed and are as follows: Filed Vitals:   11/17/13 2232  BP: 137/71  Pulse: 91  Temp: 97.5 F (36.4 C)  Resp: 18   Patient counseled on nosebleeds and measures to take if bleeding returns. Patient verbalizes understanding and agrees with plan.    MDM   1. Epistaxis    Nosebleed, resolved. No other symptoms on bleeding. DO not suspect significant blood loss.    Carlisle Cater, PA-C 11/17/13 2307

## 2013-11-18 NOTE — ED Provider Notes (Signed)
Medical screening examination/treatment/procedure(s) were performed by non-physician practitioner and as supervising physician I was immediately available for consultation/collaboration.  EKG Interpretation   None         Orpah Greek, MD 11/18/13 [phone]

## 2013-11-25 ENCOUNTER — Other Ambulatory Visit: Payer: Self-pay | Admitting: Internal Medicine

## 2013-11-25 NOTE — Telephone Encounter (Signed)
Faxed hardcopy to [place]

## 2013-11-25 NOTE — Telephone Encounter (Signed)
Done hardcopy to [facility]  

## 2013-12-19 LAB — HM MAMMOGRAPHY

## 2014-03-03 ENCOUNTER — Other Ambulatory Visit: Payer: Self-pay

## 2014-03-03 MED ORDER — LOSARTAN POTASSIUM-HCTZ 50-12.5 MG PO TABS: 1.0000 | ORAL_TABLET | Freq: Every day | ORAL | Status: DC

## 2014-04-25 ENCOUNTER — Encounter: Payer: Self-pay | Admitting: Internal Medicine

## 2014-04-25 ENCOUNTER — Telehealth: Payer: Self-pay | Admitting: Internal Medicine

## 2014-04-25 ENCOUNTER — Ambulatory Visit (INDEPENDENT_AMBULATORY_CARE_PROVIDER_SITE_OTHER): Payer: Managed Care, Other (non HMO) | Admitting: Internal Medicine

## 2014-04-25 VITALS — BP 118/90 | HR 94 | Temp 98.4°F | Wt 170.8 lb

## 2014-04-25 DIAGNOSIS — H6982 Other specified disorders of Eustachian tube, left ear: Secondary | ICD-10-CM

## 2014-04-25 DIAGNOSIS — H6980 Other specified disorders of Eustachian tube, unspecified ear: Secondary | ICD-10-CM

## 2014-04-25 DIAGNOSIS — H6590 Unspecified nonsuppurative otitis media, unspecified ear: Secondary | ICD-10-CM

## 2014-04-25 MED ORDER — NEOMYCIN-POLYMYXIN-HC 3.5-10000-1 OT SOLN: 3.0000 [drp] | Freq: Four times a day (QID) | OTIC | Status: DC

## 2014-04-25 NOTE — Progress Notes (Signed)
Pre visit review using our clinic review tool, if applicable. No additional management support is needed unless otherwise documented below in the visit note. 

## 2014-04-25 NOTE — Progress Notes (Signed)
   Subjective:    Patient ID: Crystal [name], female    DOB: December 03, 1950, 64 y.o.   MRN: [id number]  HPI   Her symptoms began over a week ago as pressure in the left ear. There was no specific trigger such as flying, swimming or scuba diving .  She's had some associated sore throat as well as minor itchy, watery eyes and sneezing.  She has pain in the left maxillary teeth.  This has been associated decreased hearing with ear pressure but no tinnitus.  She describes a dry cough related to smoking a pack per day. She's been using Flonase and Claritin with only partial response.  She describes recurrent eustachian tube dysfunction since college.    Review of Systems  She specifically denies frontal headache, facial pain, nasal purulence, otic discharge, purulent sputum, shortness of breath, wheezing  She also has no fever, chills, or sweats     Objective:   Physical Exam  Positive  findings include ptosis of the right eye.  Left tympanic membrane is dull with thin layer of wax on it  surface.  Breath sounds are decreased generally.  General appearance:adequately nourished; no acute distress or increased work of breathing is present.  No  lymphadenopathy about the head, neck, or axilla noted.   Eyes: No conjunctival inflammation or lid edema is present. There is no scleral icterus.  Ears:  External ear exam shows no significant lesions or deformities. Tympanic membranes are intact bilaterally without bulging, retraction, inflammation or discharge.  Nose:  External nasal examination shows no deformity or inflammation. Nasal mucosa are pink and moist without lesions or exudates. No septal dislocation or deviation.No obstruction to airflow.   Oral exam: Dental hygiene is good; lips and gums are healthy appearing.There is no oropharyngeal erythema or exudate noted.   Neck:  No deformities, thyromegaly, masses, or tenderness noted.   Supple with full range of motion without pain.    Heart:  Normal rate and regular rhythm. S1 and S2 normal without gallop, murmur, click, rub or other extra sounds.   Lungs:Chest clear to auscultation; no wheezes, rhonchi,rales ,or rubs present.No increased work of breathing.    Extremities:  No cyanosis, edema, or clubbing  noted    Skin: Warm & dry        Assessment & Plan:  #1 Eustachian tube dysfunction #2 otitis media, non suppurative See orders & AVS

## 2014-04-25 NOTE — Telephone Encounter (Signed)
Relevant patient education assigned to patient using Emmi. ° °

## 2014-04-25 NOTE — Patient Instructions (Addendum)
Go to [facility] for eustachian tube dysfunction. Drink thin  fluids liberally through the day and chew sugarless gum . Do the Valsalva maneuver several times a day to "pop" ears open. Flonase 1 spray in each nostril twice a day as needed. Use the "crossover" technique as discussed. Use a Neti pot daily- 2x /day  as needed for sinus congestion   Plain Mucinex (NOT D) for thick secretions ;force NON dairy fluids .   Nasal cleansing in the shower as discussed with lather of mild shampoo.After 10 seconds wash off lather while  exhaling through nostrils. Make sure that all residual soap is removed to prevent irritation. Plain Allegra (NOT D )  160 daily , Loratidine 10 mg , OR Zyrtec 10 mg @ bedtime  as needed for itchy eyes & sneezing.   Fill the  prescription for antibiotic drops  it there is not dramatic improvement in the next 72 hours.

## 2014-04-27 ENCOUNTER — Other Ambulatory Visit: Payer: Self-pay | Admitting: Internal Medicine

## 2014-04-27 NOTE — Telephone Encounter (Signed)
Done hardcopy to [facility]  

## 2014-04-27 NOTE — Telephone Encounter (Signed)
Faxed hardcopy to [place]

## 2014-07-03 ENCOUNTER — Telehealth: Payer: Self-pay | Admitting: Internal Medicine

## 2014-07-03 DIAGNOSIS — Z0000 Encounter for general adult medical examination without abnormal findings: Secondary | ICD-10-CM

## 2014-07-03 NOTE — Telephone Encounter (Signed)
Called pt to verify insurance pt has [employer] inform pt will enter cpx labs...Crystal [name]

## 2014-07-03 NOTE — Telephone Encounter (Signed)
Patient scheduled CPE for 10/10/14. She would like to have her labs done prior to her visit. Can you place orders please?

## 2014-09-04 ENCOUNTER — Other Ambulatory Visit: Payer: Self-pay | Admitting: Internal Medicine

## 2014-10-04 ENCOUNTER — Other Ambulatory Visit (INDEPENDENT_AMBULATORY_CARE_PROVIDER_SITE_OTHER): Payer: BC Managed Care – PPO

## 2014-10-04 DIAGNOSIS — Z0000 Encounter for general adult medical examination without abnormal findings: Secondary | ICD-10-CM

## 2014-10-04 LAB — CBC WITH DIFFERENTIAL/PLATELET
BASOS PCT: 0.6 % (ref 0.0–3.0)
Basophils Absolute: 0 10*3/uL (ref 0.0–0.1)
EOS PCT: 2.4 % (ref 0.0–5.0)
Eosinophils Absolute: 0.2 10*3/uL (ref 0.0–0.7)
HEMATOCRIT: 45.7 % (ref 36.0–46.0)
HEMOGLOBIN: 15.1 g/dL — AB (ref 12.0–15.0)
Lymphocytes Relative: 41.4 % (ref 12.0–46.0)
Lymphs Abs: 3.2 10*3/uL (ref 0.7–4.0)
MCHC: 33 g/dL (ref 30.0–36.0)
MCV: 98.9 fl (ref 78.0–100.0)
Monocytes Absolute: 0.9 10*3/uL (ref 0.1–1.0)
Monocytes Relative: 11.5 % (ref 3.0–12.0)
NEUTROS PCT: 44.1 % (ref 43.0–77.0)
Neutro Abs: 3.4 10*3/uL (ref 1.4–7.7)
Platelets: 325 10*3/uL (ref 150.0–400.0)
RBC: 4.62 Mil/uL (ref 3.87–5.11)
RDW: 13.6 % (ref 11.5–15.5)
WBC: 7.7 10*3/uL (ref 4.0–10.5)

## 2014-10-04 LAB — BASIC METABOLIC PANEL
BUN: 17 mg/dL (ref 6–23)
CHLORIDE: 105 meq/L (ref 96–112)
CO2: 27 meq/L (ref 19–32)
Calcium: 9.2 mg/dL (ref 8.4–10.5)
Creatinine, Ser: 0.7 mg/dL (ref 0.4–1.2)
GFR: 87.96 mL/min (ref >60.00)
Glucose, Bld: 89 mg/dL (ref 70–99)
POTASSIUM: 4.4 meq/L (ref 3.5–5.1)
SODIUM: 138 meq/L (ref 135–145)

## 2014-10-04 LAB — URINALYSIS, ROUTINE W REFLEX MICROSCOPIC
Bilirubin Urine: NEGATIVE
HGB URINE DIPSTICK: NEGATIVE
KETONES UR: NEGATIVE
Leukocytes, UA: NEGATIVE
Nitrite: NEGATIVE
Specific Gravity, Urine: 1.015 (ref 1.000–1.030)
Total Protein, Urine: NEGATIVE
UROBILINOGEN UA: 0.2 (ref 0.0–1.0)
Urine Glucose: NEGATIVE
pH: 7.5 (ref 5.0–8.0)

## 2014-10-04 LAB — LIPID PANEL
CHOL/HDL RATIO: 2
Cholesterol: 207 mg/dL — ABNORMAL HIGH (ref 0–200)
HDL: 83.1 mg/dL (ref >39.00)
LDL Cholesterol: 116 mg/dL — ABNORMAL HIGH (ref 0–99)
NONHDL: 123.9
Triglycerides: 40 mg/dL (ref 0.0–149.0)
VLDL: 8 mg/dL (ref 0.0–40.0)

## 2014-10-04 LAB — HEPATIC FUNCTION PANEL
ALK PHOS: 69 U/L (ref 39–117)
ALT: 38 U/L — AB (ref 0–35)
AST: 38 U/L — ABNORMAL HIGH (ref 0–37)
Albumin: 3.9 g/dL (ref 3.5–5.2)
BILIRUBIN DIRECT: 0.1 mg/dL (ref 0.0–0.3)
TOTAL PROTEIN: 6.8 g/dL (ref 6.0–8.3)
Total Bilirubin: 0.7 mg/dL (ref 0.2–1.2)

## 2014-10-04 LAB — TSH: TSH: 1.28 u[IU]/mL (ref 0.35–4.50)

## 2014-10-10 ENCOUNTER — Encounter: Payer: Self-pay | Admitting: Internal Medicine

## 2014-10-10 ENCOUNTER — Ambulatory Visit (INDEPENDENT_AMBULATORY_CARE_PROVIDER_SITE_OTHER): Payer: BC Managed Care – PPO | Admitting: Internal Medicine

## 2014-10-10 VITALS — BP 130/80 | HR 98 | Temp 98.5°F | Ht 63.0 in | Wt 175.0 lb

## 2014-10-10 DIAGNOSIS — F411 Generalized anxiety disorder: Secondary | ICD-10-CM

## 2014-10-10 DIAGNOSIS — Z0000 Encounter for general adult medical examination without abnormal findings: Secondary | ICD-10-CM

## 2014-10-10 MED ORDER — ALPRAZOLAM 0.5 MG PO TABS: ORAL_TABLET | ORAL | Status: DC

## 2014-10-10 MED ORDER — CYCLOBENZAPRINE HCL 5 MG PO TABS: 5.0000 mg | ORAL_TABLET | Freq: Three times a day (TID) | ORAL | Status: DC

## 2014-10-10 NOTE — Addendum Note (Signed)
Addended by: Biagio Borg on: 10/10/2014 10:36 AM   Modules accepted: Miquel Dunn

## 2014-10-10 NOTE — Progress Notes (Signed)
Pre visit review using our clinic review tool, if applicable. No additional management support is needed unless otherwise documented below in the visit note. 

## 2014-10-10 NOTE — Assessment & Plan Note (Signed)
stable overall by history and exam, and pt to continue medical treatment as before,  to f/u any worsening symptoms or concerns 

## 2014-10-10 NOTE — Assessment & Plan Note (Signed)

## 2014-10-10 NOTE — Progress Notes (Signed)
Subjective:    Patient ID: Crystal [name], female    DOB: 1951-10-08, 63 y.o.   MRN: [id number]  HPI  Here for wellness and f/u;  Overall doing ok;  Pt denies CP, worsening SOB, DOE, wheezing, orthopnea, PND, worsening LE edema, palpitations, dizziness or syncope.  Pt denies neurological change such as new headache, facial or extremity weakness.  Pt denies polydipsia, polyuria, or low sugar symptoms. Pt states overall good compliance with treatment and medications, good tolerability, and has been trying to follow lower cholesterol diet.  Pt denies worsening depressive symptoms, suicidal ideation or panic. No fever, night sweats, wt loss, loss of appetite, or other constitutional symptoms.  Pt states good ability with ADL's, has low fall risk, home safety reviewed and adequate, no other significant changes in hearing or vision, and only occasionally active with exercise.  Decliens flu shot.  Still smoking. Now total 60 pk yr hx tobacco - declines LDCT scrrening this yr Past Medical History  Diagnosis Date  . ALLERGIC RHINITIS   . ANXIETY   . COLONIC POLYPS, HX OF   . COPD   . Cough   . DEPRESSION   . GERD   . GLUCOSE INTOLERANCE   . HYPERTENSION   . IBS   . OSTEOPENIA   . PERIPHERAL EDEMA   . Pneumonia, organism unspecified   . TOBACCO USE DISORDER/SMOKER-SMOKING CESSATION DISCUSSED   . Impaired glucose tolerance   . Arthritis   . Cataract   . Osteopenia   . Bronchitis    Past Surgical History  Procedure Laterality Date  . Cesarean section    . Cholecystectomy    . Birthmark removed      from back as a child  . Bladder mesh      2012  . Wisdom tooth extraction      reports that she has been smoking.  She has never used smokeless tobacco. She reports that she drinks about 7.0 oz of alcohol per week. She reports that she does not use illicit drugs. family history includes Anxiety disorder in an other family member; Breast cancer in her sister; Colon cancer in her father and  paternal grandfather; Diabetes in an other family member; Hyperlipidemia in an other family member; Hypothyroidism in an other family member. There is no history of Esophageal cancer, Stomach cancer, or Rectal cancer. Allergies  Allergen Reactions  . Ace Inhibitors Cough  . Penicillins Rash   Current Outpatient Prescriptions on File Prior to Visit  Medication Sig Dispense Refill  . ALPRAZolam (XANAX) 0.5 MG tablet TAKE 1 OR 2 TABLETS BY MOUTH AT BEDTIME AS NEEDED 60 tablet 2  . aspirin 325 MG tablet Take 325 mg by mouth every 4 (four) hours as needed for mild pain.    . Calcium Carbonate (CVS CALCIUM) 1500 MG TABS Take by mouth daily.      . Cholecalciferol (VITAMIN D) 1000 UNITS capsule Take 1,000 Units by mouth daily.      . folic acid (FOLVITE) 1 MG tablet Take 1 mg by mouth daily.      Marland Kitchen loratadine (CLARITIN) 10 MG tablet Take 10 mg by mouth daily as needed.     Marland Kitchen losartan-hydrochlorothiazide (HYZAAR) 50-12.5 MG per tablet Take 1 tablet by mouth daily. 90 tablet 3   No current facility-administered medications on file prior to visit.    Review of Systems Constitutional: Negative for increased diaphoresis, other activity, appetite or other siginficant weight change  HENT: Negative for worsening hearing loss,  ear pain, facial swelling, mouth sores and neck stiffness.   Eyes: Negative for other worsening pain, redness or visual disturbance.  Respiratory: Negative for shortness of breath and wheezing.   Cardiovascular: Negative for chest pain and palpitations.  Gastrointestinal: Negative for diarrhea, blood in stool, abdominal distention or other pain Genitourinary: Negative for hematuria, flank pain or change in urine volume.  Musculoskeletal: Negative for myalgias or other joint complaints.  Skin: Negative for color change and wound.  Neurological: Negative for syncope and numbness. other than noted Hematological: Negative for adenopathy. or other swelling Psychiatric/Behavioral:  Negative for hallucinations, self-injury, decreased concentration or other worsening agitation.      Objective:   Physical Exam Blood pressure 130/80, pulse 98, temperature 98.5 F (36.9 C), temperature source Oral, height 5' 3"  (1.6 m), weight 175 lb (79.379 kg), SpO2 98 %. VS noted,  Constitutional: Pt is oriented to person, place, and time. Appears well-developed and well-nourished.  Head: Normocephalic and atraumatic.  Right Ear: External ear normal.  Left Ear: External ear normal.  Nose: Nose normal.  Mouth/Throat: Oropharynx is clear and moist.  Eyes: Conjunctivae and EOM are normal. Pupils are equal, round, and reactive to light.  Neck: Normal range of motion. Neck supple. No JVD present. No tracheal deviation present.  Cardiovascular: Normal rate, regular rhythm, normal heart sounds and intact distal pulses.   Pulmonary/Chest: Effort normal and breath sounds without rales or wheezing  Abdominal: Soft. Bowel sounds are normal. NT. No HSM  Musculoskeletal: Normal range of motion. Exhibits no edema.  Lymphadenopathy:  Has no cervical adenopathy.  Neurological: Pt is alert and oriented to person, place, and time. Pt has normal reflexes. No cranial nerve deficit. Motor grossly intact Skin: Skin is warm and dry. No rash noted.  Psychiatric:  Has mild nervous mood and affect. Behavior is normal.     Assessment & Plan:

## 2014-10-10 NOTE — Patient Instructions (Addendum)
Please continue all other medications as before, and refills have been done if requested - the xanax  Please have the pharmacy call with any other refills you may need.  Please continue your efforts at being more active, low cholesterol diet, and weight control.  You are otherwise up to date with prevention measures today.  Please keep your appointments with your specialists as you may have planned  Please remind [place] next year as you said for the screening CT scan  Please return in 1 year for your yearly visit, or sooner if needed

## 2014-12-26 ENCOUNTER — Telehealth: Payer: Self-pay | Admitting: Internal Medicine

## 2014-12-26 DIAGNOSIS — M810 Age-related osteoporosis without current pathological fracture: Secondary | ICD-10-CM

## 2014-12-26 NOTE — Telephone Encounter (Signed)
Patient would like a referral for a Bone Density test.

## 2014-12-26 NOTE — Telephone Encounter (Signed)
Ok ,this has been ordered  Schedulers to assist with appt

## 2014-12-27 NOTE — Telephone Encounter (Signed)
Gave msg to schedulers will contact pt to set up bone density...Crystal [name]

## 2014-12-28 ENCOUNTER — Telehealth: Payer: Self-pay | Admitting: Internal Medicine

## 2014-12-28 NOTE — Telephone Encounter (Signed)
Patient needs referral for DEXA to be sent to [facility].  Patient has an appointment 01/09/15 at 10:30.

## 2014-12-28 NOTE — Telephone Encounter (Signed)
Left patient voicemail to call back to schedule bone density per Dr. Gwynn Burly request.

## 2014-12-29 NOTE — Telephone Encounter (Signed)
Called patient back to ask where would she like for me to send the referral to. Only information I have is [facility]?

## 2014-12-29 NOTE — Telephone Encounter (Signed)
Done hardcopy to [facility]  

## 2015-01-04 ENCOUNTER — Telehealth: Payer: Self-pay

## 2015-01-04 NOTE — Telephone Encounter (Signed)
Sent mychart message

## 2015-03-22 ENCOUNTER — Other Ambulatory Visit: Payer: Self-pay | Admitting: Internal Medicine

## 2015-03-22 NOTE — Telephone Encounter (Signed)
MD out of office pls advise on refill.../lmb 

## 2015-03-22 NOTE — Telephone Encounter (Signed)
OK X1 

## 2015-03-22 NOTE — Telephone Encounter (Signed)
Rx faxed back to [facility].../lmb

## 2015-04-01 ENCOUNTER — Other Ambulatory Visit: Payer: Self-pay | Admitting: Internal Medicine

## 2015-04-30 ENCOUNTER — Telehealth: Payer: Self-pay | Admitting: Internal Medicine

## 2015-04-30 MED ORDER — LOSARTAN POTASSIUM-HCTZ 50-12.5 MG PO TABS: 1.0000 | ORAL_TABLET | Freq: Every day | ORAL | Status: DC

## 2015-04-30 NOTE — Telephone Encounter (Signed)
Pt came by to update her new insurance coverage to [employer].  Pt now wants all her refills to be sent to [facility] and is requesting 90 day refills on them.  Currently  Pt is in need of refills on her Losartan & Xanax.

## 2015-04-30 NOTE — Telephone Encounter (Signed)
OK to fill this prescription with additional refills x0 OV w/Dr Jenny Reichmann q 6 mo Thank you!

## 2015-04-30 NOTE — Telephone Encounter (Signed)
Please advise on Xanax refill in PCP's absence, thanks

## 2015-05-01 MED ORDER — ALPRAZOLAM 0.5 MG PO TABS: 0.5000 mg | ORAL_TABLET | Freq: Every evening | ORAL | Status: DC | PRN

## 2015-05-01 NOTE — Telephone Encounter (Signed)
Rx faxed to pharmacy  

## 2015-06-28 ENCOUNTER — Other Ambulatory Visit: Payer: Self-pay

## 2015-06-28 MED ORDER — ALPRAZOLAM 0.5 MG PO TABS: 0.5000 mg | ORAL_TABLET | Freq: Every evening | ORAL | Status: DC | PRN

## 2015-06-28 NOTE — Telephone Encounter (Signed)
Done hardcopy to Dahlia  

## 2015-06-28 NOTE — Telephone Encounter (Signed)
Rx faxed to pharmacy  

## 2015-07-12 ENCOUNTER — Telehealth: Payer: Self-pay | Admitting: Internal Medicine

## 2015-07-12 NOTE — Telephone Encounter (Signed)
Pt has an appt on 10/4 with Dr Tamala Julian , she has [employer].  She will need a ins referral

## 2015-07-19 NOTE — Telephone Encounter (Signed)
Mcarthur Rossetti Josem Kaufmann #[id number] valid 07/24/2015 - 01/20/2016 for 6 visits

## 2015-07-24 ENCOUNTER — Other Ambulatory Visit (INDEPENDENT_AMBULATORY_CARE_PROVIDER_SITE_OTHER): Payer: Commercial Managed Care - HMO

## 2015-07-24 ENCOUNTER — Ambulatory Visit (INDEPENDENT_AMBULATORY_CARE_PROVIDER_SITE_OTHER): Payer: Commercial Managed Care - HMO | Admitting: Family Medicine

## 2015-07-24 ENCOUNTER — Encounter: Payer: Self-pay | Admitting: Family Medicine

## 2015-07-24 ENCOUNTER — Telehealth: Payer: Self-pay | Admitting: Internal Medicine

## 2015-07-24 VITALS — BP 128/84 | HR 93 | Ht 63.0 in | Wt 185.0 lb

## 2015-07-24 DIAGNOSIS — M13162 Monoarthritis, not elsewhere classified, left knee: Secondary | ICD-10-CM

## 2015-07-24 DIAGNOSIS — M25562 Pain in left knee: Secondary | ICD-10-CM

## 2015-07-24 DIAGNOSIS — M1712 Unilateral primary osteoarthritis, left knee: Secondary | ICD-10-CM | POA: Insufficient documentation

## 2015-07-24 DIAGNOSIS — Z0000 Encounter for general adult medical examination without abnormal findings: Secondary | ICD-10-CM

## 2015-07-24 NOTE — Assessment & Plan Note (Addendum)
Patient does have severe patellofemoral narrowing noted with mild to moderate osteophytic changes of the other compartments. We discussed icing, topical anti-inflammatory's, home exercises and patient did work with Product/process development scientist. Patient will try the conservative therapy and see me again in 4 weeks. If worsening symptoms we'll consider steroid injection. Patient may also be a candidate for custom bracing. We will discuss at follow-up. Uric acid can also be within the differential and may need further labs

## 2015-07-24 NOTE — Patient Instructions (Signed)
Good to see you.  Ice 20 minutes 2 times daily. Usually after activity and before bed. Exercises 3 times a week.  Take tylenol 650 mg three times a day is the best evidence based medicine we have for arthritis.  Glucosamine sulfate 1519m a day is a supplement that has been shown to help moderate to severe arthritis. Vitamin D 2000 IU daily Fish oil 2 grams daily.  Tumeric 5092mtwice daily.  pennsaid pinkie amount topically 2 times daily as needed.  Cortisone injections are an option if these interventions do not seem to make a difference or need more relief.  If cortisone injections do not help, there are different types of shots that may help but they take longer to take effect.  We can discuss this at follow up.  It's important that you continue to stay active. Controlling your weight is important.  Consider physical therapy to strengthen muscles around the joint that hurts to take pressure off of the joint itself. Shoe inserts with good arch support may be helpful.  [facility] orthotics at [facility] could help.  Water aerobics and cycling with low resistance are the best two types of exercise for arthritis. Come back and see me in 4 weeks.

## 2015-07-24 NOTE — Progress Notes (Signed)
Pre visit review using our clinic review tool, if applicable. No additional management support is needed unless otherwise documented below in the visit note. 

## 2015-07-24 NOTE — Progress Notes (Signed)
[name] [address], [place] [address] Phone: [phone] Subjective:    I'm seeing this patient by the request  of:  Cathlean Cower, MD   CC: left knee pain  [id number] Crystal [name] is a 64 y.o. female coming in with complaint of left knee pain.  Patient is had this pain for proximal a 6 month. Started Ranger injury. Seem to hurt more on the anterior aspect of the knee and then seemed to localize more to the posterior aspect of the knee. Worse after doing significant amount of activity. Patient has changed her daily activity secondary to pain. Mild radiation down the posterior calf. Rates the severity of pain a 7 out of 10. States over the course last week it is significantly improved and today she is only at 2 out of 10 pain. Denies any locking but sometimes feel like she had some mild instability.  Past Medical History  Diagnosis Date  . ALLERGIC RHINITIS   . ANXIETY   . COLONIC POLYPS, HX OF   . COPD   . Cough   . DEPRESSION   . GERD   . GLUCOSE INTOLERANCE   . HYPERTENSION   . IBS   . OSTEOPENIA   . PERIPHERAL EDEMA   . Pneumonia, organism unspecified   . TOBACCO USE DISORDER/SMOKER-SMOKING CESSATION DISCUSSED   . Impaired glucose tolerance   . Arthritis   . Cataract   . Osteopenia   . Bronchitis    Past Surgical History  Procedure Laterality Date  . Cesarean section    . Cholecystectomy    . Birthmark removed      from back as a child  . Bladder mesh      2012  . Wisdom tooth extraction     Social History  Substance Use Topics  . Smoking status: Current Every Day Smoker -- 0.50 packs/day for .5 years  . Smokeless tobacco: Never Used  . Alcohol Use: 7.0 oz/week    14 drink(s) per week     Comment: couple drinks every night   Allergies  Allergen Reactions  . Ace Inhibitors Cough  . Penicillins Rash   Family History  Problem Relation Age of Onset  . Breast cancer Sister     x 2  . Anxiety disorder    .  Hypothyroidism    . Hyperlipidemia    . Diabetes    . Colon cancer Father   . Colon cancer Paternal Grandfather   . Esophageal cancer Neg Hx   . Stomach cancer Neg Hx   . Rectal cancer Neg Hx       Past medical history, social, surgical and family history all reviewed in electronic medical record.   Review of Systems: No headache, visual changes, nausea, vomiting, diarrhea, constipation, dizziness, abdominal pain, skin rash, fevers, chills, night sweats, weight loss, swollen lymph nodes, body aches, joint swelling, muscle aches, chest pain, shortness of breath, mood changes.   Objective Blood pressure 128/84, pulse 93, height 5' 3"  (1.6 m), weight 185 lb (83.915 kg), SpO2 99 %.  General: No apparent distress alert and oriented x3 mood and affect normal, dressed appropriately.  HEENT: Pupils equal, extraocular movements intact  Respiratory: Patient's speak in full sentences and does not appear short of breath  Cardiovascular: No lower extremity edema, non tender, no erythema  Skin: Warm dry intact with no signs of infection or rash on extremities or on axial skeleton.  Abdomen: Soft nontender  Neuro:  Cranial nerves II through XII are intact, neurovascularly intact in all extremities with 2+ DTRs and 2+ pulses.  Lymph: No lymphadenopathy of posterior or anterior cervical chain or axillae bilaterally.  Gait normal with good balance and coordination.  MSK:  Non tender with full range of motion and good stability and symmetric strength and tone of shoulders, elbows, wrist, hip, and ankles bilaterally. Moderate osteoarthritic changes of multiple joints. Knee: left Mild varus deformity of the knees bilaterally Mild tenderness to palpation over the superior lateral patellofemoral jointlateral tracking of the knee noted ROM full in flexion and extension and lower leg rotation. Ligaments with solid consistent endpoints including ACL, PCL, LCL, MCL. Negative Mcmurray's, Apley's, and Thessalonian  tests. Non painful patellar compression. Patellar glide severe crepitus Patellar and quadriceps tendons unremarkable. Hamstring and quadriceps strength is normal.  Contralateral knee also has arthritic changes noted  MSK US performed of: left knee This study was ordered, performed, and interpreted by Charlann Boxer D.O.  Knee: All structures visualized. Mild degenerative changes of the meniscus but no true acute tearsevere narrowing of the patellofemoral joint laterally Patellar Tendon unremarkable on long and transverse views without effusion. No abnormality of prepatellar bursa. LCL and MCL unremarkable on long and transverse views. No abnormality of origin of medial or lateral head of the gastrocnemius.questionable remnant of Baker's cyst on the posterior medial aspect of the knee difficult to assess.  IMPRESSION: severe patellofemoral arthritis with mild to moderate medial and lateral compartment arthritis and questionable uric acid deposits   Procedurenote 97110; 15 minutes spent for Therapeutic exercises as stated in above notes.  This included exercises focusing on stretching, strengthening, with significant focus on eccentric aspects.patient given flexion and extension exercises focusing on vastus medialis oblique exercises as well as hip abductor strengthening.   Proper technique shown and discussed handout in great detail with ATC.  All questions were discussed and answered.      Impression and Recommendations:     This case required medical decision making of moderate complexity.

## 2015-07-24 NOTE — Telephone Encounter (Signed)
Patient has cpe on 12/6.  She is requesting fasting labs to be entered so she can come a week ahead of cpe.

## 2015-07-24 NOTE — Telephone Encounter (Signed)
Labs ordered, pt advised via VM

## 2015-08-21 ENCOUNTER — Ambulatory Visit: Payer: Commercial Managed Care - HMO | Admitting: Family Medicine

## 2015-09-03 ENCOUNTER — Ambulatory Visit (INDEPENDENT_AMBULATORY_CARE_PROVIDER_SITE_OTHER): Payer: Commercial Managed Care - HMO | Admitting: Family Medicine

## 2015-09-03 ENCOUNTER — Encounter: Payer: Self-pay | Admitting: Family Medicine

## 2015-09-03 VITALS — BP 126/78 | HR 105 | Ht 63.0 in | Wt 184.0 lb

## 2015-09-03 DIAGNOSIS — M25562 Pain in left knee: Secondary | ICD-10-CM | POA: Diagnosis not present

## 2015-09-03 DIAGNOSIS — M1710 Unilateral primary osteoarthritis, unspecified knee: Secondary | ICD-10-CM | POA: Insufficient documentation

## 2015-09-03 DIAGNOSIS — M13162 Monoarthritis, not elsewhere classified, left knee: Secondary | ICD-10-CM | POA: Diagnosis not present

## 2015-09-03 DIAGNOSIS — M13169 Monoarthritis, not elsewhere classified, unspecified knee: Secondary | ICD-10-CM

## 2015-09-03 DIAGNOSIS — M25561 Pain in right knee: Secondary | ICD-10-CM | POA: Diagnosis not present

## 2015-09-03 DIAGNOSIS — M1712 Unilateral primary osteoarthritis, left knee: Secondary | ICD-10-CM

## 2015-09-03 NOTE — Patient Instructions (Signed)
Great to see you Crystal [name] is your friend.  We injected knees today.  We will get you into physical therapy they will call you  Continue the same exercises and stay active See me again after first of the year and if still in pain we will do orthovisc.  Happy holidays!

## 2015-09-03 NOTE — Progress Notes (Signed)
[name] [address] [place], [place] [address] Phone: [phone] Subjective:    I'm seeing this patient by the request  of:  Cathlean Cower, MD   CC: left knee pain follow up  [id number] Crystal [name] is a 64 y.o. female coming in with complaint of left knee pain.  Patient was found to have severe patellofemoral arthritis. Patient elected try conservative therapy including topical anti-inflammatories, icing, home exercises. Patient states  She has made approximately 30-40% improvement. Still having pain in both knees. States that it seems to be more bilateral now. Worse with going up and down steps. It is affecting some of her daily activities. Patient would like some more aggressive therapy if possible.  Past Medical History  Diagnosis Date  . ALLERGIC RHINITIS   . ANXIETY   . COLONIC POLYPS, HX OF   . COPD   . Cough   . DEPRESSION   . GERD   . GLUCOSE INTOLERANCE   . HYPERTENSION   . IBS   . OSTEOPENIA   . PERIPHERAL EDEMA   . Pneumonia, organism unspecified   . TOBACCO USE DISORDER/SMOKER-SMOKING CESSATION DISCUSSED   . Impaired glucose tolerance   . Arthritis   . Cataract   . Osteopenia   . Bronchitis    Past Surgical History  Procedure Laterality Date  . Cesarean section    . Cholecystectomy    . Birthmark removed      from back as a child  . Bladder mesh      2012  . Wisdom tooth extraction     Social History  Substance Use Topics  . Smoking status: Current Every Day Smoker -- 0.50 packs/day for .5 years  . Smokeless tobacco: Never Used  . Alcohol Use: 7.0 oz/week    14 drink(s) per week     Comment: couple drinks every night   Allergies  Allergen Reactions  . Ace Inhibitors Cough  . Penicillins Rash   Family History  Problem Relation Age of Onset  . Breast cancer Sister     x 2  . Anxiety disorder    . Hypothyroidism    . Hyperlipidemia    . Diabetes    . Colon cancer Father   . Colon cancer Paternal  Grandfather   . Esophageal cancer Neg Hx   . Stomach cancer Neg Hx   . Rectal cancer Neg Hx       Past medical history, social, surgical and family history all reviewed in electronic medical record.   Review of Systems: No headache, visual changes, nausea, vomiting, diarrhea, constipation, dizziness, abdominal pain, skin rash, fevers, chills, night sweats, weight loss, swollen lymph nodes, body aches, joint swelling, muscle aches, chest pain, shortness of breath, mood changes.   Objective Blood pressure 126/78, pulse 105, height 5' 3"  (1.6 m), weight 184 lb (83.462 kg), SpO2 97 %.  General: No apparent distress alert and oriented x3 mood and affect normal, dressed appropriately.  HEENT: Pupils equal, extraocular movements intact  Respiratory: Patient's speak in full sentences and does not appear short of breath  Cardiovascular: No lower extremity edema, non tender, no erythema  Skin: Warm dry intact with no signs of infection or rash on extremities or on axial skeleton.  Abdomen: Soft nontender  Neuro: Cranial nerves II through XII are intact, neurovascularly intact in all extremities with 2+ DTRs and 2+ pulses.  Lymph: No lymphadenopathy of posterior or anterior cervical chain or axillae bilaterally.  Gait  normal with good balance and coordination.  MSK:  Non tender with full range of motion and good stability and symmetric strength and tone of shoulders, elbows, wrist, hip, and ankles bilaterally. Moderate osteoarthritic changes of multiple joints. Knee: left Mild varus deformity of the knees bilaterally Mild tenderness to palpation over the superior lateral patellofemoral jointlateral tracking of the knee noted ROM full in flexion and extension and lower leg rotation. Ligaments with solid consistent endpoints including ACL, PCL, LCL, MCL. Negative Mcmurray's, Apley's, and Thessalonian tests. Non painful patellar compression. Patellar glide severe crepitus Patellar and quadriceps  tendons unremarkable. Hamstring and quadriceps strength is normal.  Contralateral knee also has arthritic changes noted    After informed written and verbal consent, patient was seated on exam table. Right knee was prepped with alcohol swab and utilizing anterolateral approach, patient's right knee space was injected with 4:1  marcaine 0.5%: Kenalog 91m/dL. Patient tolerated the procedure well without immediate complications.  After informed written and verbal consent, patient was seated on exam table. Left knee was prepped with alcohol swab and utilizing anterolateral approach, patient's left knee space was injected with 4:1  marcaine 0.5%: Kenalog 475mdL. Patient tolerated the procedure well without immediate complications.     Impression and Recommendations:     This case required medical decision making of moderate complexity.

## 2015-09-03 NOTE — Assessment & Plan Note (Signed)
Patient given injections today. Tolerated the procedure well. We discussed icing regimen and home exercises. Patient will start with formal physical therapy. Patient would consider viscous supplementation but would like to wait till the first of a year. Patient will continue with the other conservative therapy measure. We'll see patient back again in 6 weeks.

## 2015-09-03 NOTE — Progress Notes (Signed)
Pre visit review using our clinic review tool, if applicable. No additional management support is needed unless otherwise documented below in the visit note. 

## 2015-09-24 DIAGNOSIS — F419 Anxiety disorder, unspecified: Secondary | ICD-10-CM | POA: Diagnosis not present

## 2015-09-24 DIAGNOSIS — I10 Essential (primary) hypertension: Secondary | ICD-10-CM | POA: Diagnosis not present

## 2015-09-24 DIAGNOSIS — Z6832 Body mass index (BMI) 32.0-32.9, adult: Secondary | ICD-10-CM | POA: Diagnosis not present

## 2015-09-24 DIAGNOSIS — E669 Obesity, unspecified: Secondary | ICD-10-CM | POA: Diagnosis not present

## 2015-09-24 DIAGNOSIS — Z0000 Encounter for general adult medical examination without abnormal findings: Secondary | ICD-10-CM | POA: Diagnosis not present

## 2015-09-25 ENCOUNTER — Encounter: Payer: Commercial Managed Care - HMO | Admitting: Internal Medicine

## 2015-10-03 ENCOUNTER — Encounter: Payer: BC Managed Care – PPO | Admitting: Internal Medicine

## 2015-10-05 DIAGNOSIS — Z01419 Encounter for gynecological examination (general) (routine) without abnormal findings: Secondary | ICD-10-CM | POA: Diagnosis not present

## 2015-10-11 ENCOUNTER — Other Ambulatory Visit (INDEPENDENT_AMBULATORY_CARE_PROVIDER_SITE_OTHER): Payer: Commercial Managed Care - HMO

## 2015-10-11 DIAGNOSIS — Z0000 Encounter for general adult medical examination without abnormal findings: Secondary | ICD-10-CM

## 2015-10-11 LAB — URINALYSIS, ROUTINE W REFLEX MICROSCOPIC
Ketones, ur: NEGATIVE
Nitrite: NEGATIVE
Specific Gravity, Urine: 1.02 (ref 1.000–1.030)
Urine Glucose: NEGATIVE
Urobilinogen, UA: 0.2 (ref 0.0–1.0)
pH: 6 (ref 5.0–8.0)

## 2015-10-11 LAB — BASIC METABOLIC PANEL
BUN: 17 mg/dL (ref 6–23)
CALCIUM: 9.9 mg/dL (ref 8.4–10.5)
CO2: 31 meq/L (ref 19–32)
Chloride: 98 mEq/L (ref 96–112)
Creatinine, Ser: 0.69 mg/dL (ref 0.40–1.20)
GFR: 90.62 mL/min (ref >60.00)
GLUCOSE: 104 mg/dL — AB (ref 70–99)
POTASSIUM: 4.7 meq/L (ref 3.5–5.1)
Sodium: 138 mEq/L (ref 135–145)

## 2015-10-11 LAB — CBC WITH DIFFERENTIAL/PLATELET
Basophils Absolute: 0.1 10*3/uL (ref 0.0–0.1)
Basophils Relative: 0.6 % (ref 0.0–3.0)
Eosinophils Absolute: 0.1 10*3/uL (ref 0.0–0.7)
Eosinophils Relative: 1.4 % (ref 0.0–5.0)
HCT: 46.4 % — ABNORMAL HIGH (ref 36.0–46.0)
Hemoglobin: 15.6 g/dL — ABNORMAL HIGH (ref 12.0–15.0)
Lymphocytes Relative: 34.7 % (ref 12.0–46.0)
Lymphs Abs: 2.7 10*3/uL (ref 0.7–4.0)
MCHC: 33.7 g/dL (ref 30.0–36.0)
MCV: 98.9 fl (ref 78.0–100.0)
Monocytes Absolute: 0.8 10*3/uL (ref 0.1–1.0)
Monocytes Relative: 10.8 % (ref 3.0–12.0)
Neutro Abs: 4.1 10*3/uL (ref 1.4–7.7)
Neutrophils Relative %: 52.5 % (ref 43.0–77.0)
Platelets: 298 10*3/uL (ref 150.0–400.0)
RBC: 4.69 Mil/uL (ref 3.87–5.11)
RDW: 13.9 % (ref 11.5–15.5)
WBC: 7.7 10*3/uL (ref 4.0–10.5)

## 2015-10-11 LAB — HEPATIC FUNCTION PANEL
ALT: 44 U/L — AB (ref 0–35)
AST: 36 U/L (ref 0–37)
Albumin: 4 g/dL (ref 3.5–5.2)
Alkaline Phosphatase: 86 U/L (ref 39–117)
BILIRUBIN DIRECT: 0.1 mg/dL (ref 0.0–0.3)
BILIRUBIN TOTAL: 0.7 mg/dL (ref 0.2–1.2)
Total Protein: 7 g/dL (ref 6.0–8.3)

## 2015-10-11 LAB — LIPID PANEL
Cholesterol: 208 mg/dL — ABNORMAL HIGH (ref 0–200)
HDL: 105.3 mg/dL (ref >39.00)
LDL Cholesterol: 94 mg/dL (ref 0–99)
NonHDL: 102.53
Total CHOL/HDL Ratio: 2
Triglycerides: 43 mg/dL (ref 0.0–149.0)
VLDL: 8.6 mg/dL (ref 0.0–40.0)

## 2015-10-11 LAB — TSH: TSH: 1.42 u[IU]/mL (ref 0.35–4.50)

## 2015-10-18 ENCOUNTER — Other Ambulatory Visit: Payer: Commercial Managed Care - HMO

## 2015-10-18 ENCOUNTER — Encounter: Payer: Self-pay | Admitting: Internal Medicine

## 2015-10-18 ENCOUNTER — Ambulatory Visit (INDEPENDENT_AMBULATORY_CARE_PROVIDER_SITE_OTHER): Payer: Commercial Managed Care - HMO | Admitting: Internal Medicine

## 2015-10-18 VITALS — BP 124/80 | HR 94 | Temp 98.1°F | Ht 63.0 in | Wt 183.0 lb

## 2015-10-18 DIAGNOSIS — Z0000 Encounter for general adult medical examination without abnormal findings: Secondary | ICD-10-CM

## 2015-10-18 DIAGNOSIS — H538 Other visual disturbances: Secondary | ICD-10-CM

## 2015-10-18 DIAGNOSIS — I10 Essential (primary) hypertension: Secondary | ICD-10-CM

## 2015-10-18 DIAGNOSIS — L989 Disorder of the skin and subcutaneous tissue, unspecified: Secondary | ICD-10-CM

## 2015-10-18 DIAGNOSIS — M25561 Pain in right knee: Secondary | ICD-10-CM | POA: Diagnosis not present

## 2015-10-18 DIAGNOSIS — F411 Generalized anxiety disorder: Secondary | ICD-10-CM

## 2015-10-18 DIAGNOSIS — M25562 Pain in left knee: Secondary | ICD-10-CM

## 2015-10-18 DIAGNOSIS — Z86010 Personal history of colonic polyps: Secondary | ICD-10-CM

## 2015-10-18 MED ORDER — LOSARTAN POTASSIUM-HCTZ 50-12.5 MG PO TABS: 1.0000 | ORAL_TABLET | Freq: Every day | ORAL | Status: DC

## 2015-10-18 MED ORDER — ALPRAZOLAM 0.5 MG PO TABS: 0.5000 mg | ORAL_TABLET | Freq: Every evening | ORAL | Status: DC | PRN

## 2015-10-18 NOTE — Assessment & Plan Note (Signed)
stable overall by history and exam, recent data reviewed with pt, and pt to continue medical treatment as before,  to f/u any worsening symptoms or concerns, for med refill

## 2015-10-18 NOTE — Assessment & Plan Note (Signed)
[facility] for PT eval - for referral

## 2015-10-18 NOTE — Assessment & Plan Note (Signed)
For f/u colonsocpy mar 2017 with GI -

## 2015-10-18 NOTE — Patient Instructions (Addendum)
Your EKG was OK today  Please continue all other medications as before, and refills have been done if requested.  Please have the pharmacy call with any other refills you may need.  Please continue your efforts at being more active, low cholesterol diet, and weight control.  You are otherwise up to date with prevention measures today.  Please keep your appointments with your specialists as you may have planned  You will be contacted regarding the referral for: colonoscopy, dermatology, Gastroenterology, and outpatient Physical therapy  Please go to the LAB in the Basement (turn left off the elevator) for the tests to be done today - the Hep C testing only  You will be contacted by phone if any changes need to be made immediately.  Otherwise, you will receive a letter about your results with an explanation, but please check with MyChart first.  Please remember to sign up for MyChart if you have not done so, as this will be important to you in the future with finding out test results, communicating by private email, and scheduling acute appointments online when needed.  Please return in 6 months, or sooner if needed

## 2015-10-18 NOTE — Assessment & Plan Note (Signed)
stable overall by history and exam, recent data reviewed with pt, and pt to continue medical treatment as before,  to f/u any worsening symptoms or concerns BP Readings from Last 3 Encounters:  10/18/15 124/80  09/03/15 126/78  07/24/15 128/84

## 2015-10-18 NOTE — Progress Notes (Signed)
Pre visit review using our clinic review tool, if applicable. No additional management support is needed unless otherwise documented below in the visit note. 

## 2015-10-18 NOTE — Addendum Note (Signed)
Addended by: Lyman Bishop on: 10/18/2015 11:52 AM   Modules accepted: Orders

## 2015-10-18 NOTE — Assessment & Plan Note (Signed)

## 2015-10-18 NOTE — Progress Notes (Signed)
Subjective:    Patient ID: Crystal [name], female    DOB: 12-Sep-1951, 64 y.o.   MRN: [id number]  HPI  Here for wellness and f/u;  Overall doing ok;  Pt denies Chest pain, worsening SOB, DOE, wheezing, orthopnea, PND, worsening LE edema, palpitations, dizziness or syncope.  Pt denies neurological change such as new headache, facial or extremity weakness.  Pt denies polydipsia, polyuria, or low sugar symptoms. Pt states overall good compliance with treatment and medications, good tolerability, and has been trying to follow appropriate diet.  Pt denies worsening depressive symptoms, suicidal ideation or panic. No fever, night sweats, wt loss, loss of appetite, or other constitutional symptoms.  Pt states good ability with ADL's, has low fall risk, home safety reviewed and adequate, no other significant changes in hearing or vision, and only occasionally active with exercise.  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 Not working, retired.  To f/u with GI/Dr Armbruster for colonsocpy 2017.  Has had bilat cortisone per Dr Tamala Julian, plan is for orthovisc shots but needs to see PT at least once prior - referral not done however.  Needs referral to derm for mole check. Past Medical History  Diagnosis Date  . ALLERGIC RHINITIS   . ANXIETY   . COLONIC POLYPS, HX OF   . COPD   . Cough   . DEPRESSION   . GERD   . GLUCOSE INTOLERANCE   . HYPERTENSION   . IBS   . OSTEOPENIA   . PERIPHERAL EDEMA   . Pneumonia, organism unspecified   . TOBACCO USE DISORDER/SMOKER-SMOKING CESSATION DISCUSSED   . Impaired glucose tolerance   . Arthritis   . Cataract   . Osteopenia   . Bronchitis    Past Surgical History  Procedure Laterality Date  . Cesarean section    . Cholecystectomy    . Birthmark removed      from back as a child  . Bladder mesh      2012  . Wisdom tooth extraction      reports that she has been smoking.  She has never used smokeless tobacco. She reports that she drinks about 7.0 oz of alcohol per week. She reports that she does not use illicit drugs. family history includes Breast cancer in her sister; Colon cancer in her father and paternal grandfather. There is no history of Esophageal cancer, Stomach cancer, or Rectal cancer. Allergies  Allergen Reactions  . Ace Inhibitors Cough  . Penicillins Rash   Current Outpatient Prescriptions on File Prior to Visit  Medication Sig Dispense Refill  . ALPRAZolam (XANAX) 0.5 MG tablet Take 1-2 tablets (0.5-1 mg total) by mouth at bedtime as needed. 60 tablet 2  . aspirin 325 MG tablet Take 325 mg by mouth every 4 (four) hours as needed for mild pain.    . Calcium Carbonate ([facility] CALCIUM) 1500 MG TABS Take by mouth daily.      . Cholecalciferol (VITAMIN D) 1000 UNITS capsule Take 1,000 Units by mouth daily.      . cyclobenzaprine (FLEXERIL) 5 MG tablet Take 1 tablet (5 mg total) by mouth 3 (three) times daily. 30 tablet 11  . folic acid (FOLVITE) 1 MG tablet Take 1 mg by mouth daily.      Marland Kitchen loratadine (CLARITIN) 10 MG tablet Take 10 mg by mouth daily as needed.  No current facility-administered medications on file prior to visit.   Review of Systems Constitutional: Negative for increased diaphoresis, other activity, appetite or siginficant weight change other than noted HENT: Negative for worsening hearing loss, ear pain, facial swelling, mouth sores and neck stiffness.   Eyes: Negative for other worsening pain, redness or visual disturbance.  Respiratory: Negative for shortness of breath and wheezing  Cardiovascular: Negative for chest pain and palpitations.  Gastrointestinal: Negative for diarrhea, blood in stool, abdominal distention or other pain Genitourinary: Negative for hematuria, flank pain or change in urine volume.  Musculoskeletal: Negative for myalgias or other joint complaints.  Skin: Negative for  color change and wound or drainage.  Neurological: Negative for syncope and numbness. other than noted Hematological: Negative for adenopathy. or other swelling Psychiatric/Behavioral: Negative for hallucinations, SI, self-injury, decreased concentration or other worsening agitation.      Objective:   Physical Exam BP 124/80 mmHg  Pulse 94  Temp(Src) 98.1 F (36.7 C) (Oral)  Ht 5' 3"  (1.6 m)  Wt 183 lb (83.008 kg)  BMI 32.43 kg/m2  SpO2 98% VS noted,  Constitutional: Pt is oriented to person, place, and time. Appears well-developed and well-nourished, in no significant distress Head: Normocephalic and atraumatic.  Right Ear: External ear normal.  Left Ear: External ear normal.  Nose: Nose normal.  Mouth/Throat: Oropharynx is clear and moist.  Eyes: Conjunctivae and EOM are normal. Pupils are equal, round, and reactive to light.  Neck: Normal range of motion. Neck supple. No JVD present. No tracheal deviation present or significant neck LA or mass Cardiovascular: Normal rate, regular rhythm, normal heart sounds and intact distal pulses.   Pulmonary/Chest: Effort normal and breath sounds without rales or wheezing  Abdominal: Soft. Bowel sounds are normal. NT. No HSM  Musculoskeletal: Normal range of motion. Exhibits no edema.  Lymphadenopathy:  Has no cervical adenopathy.  Neurological: Pt is alert and oriented to person, place, and time. Pt has normal reflexes. No cranial nerve deficit. Motor grossly intact Skin: Skin is warm and dry. No rash noted.  Psychiatric:  Has normal mood and affect. Behavior is normal.     Assessment & Plan:

## 2015-10-19 ENCOUNTER — Encounter: Payer: Self-pay | Admitting: Gastroenterology

## 2015-10-19 LAB — HEPATITIS C ANTIBODY: HCV AB: NEGATIVE

## 2015-10-31 DIAGNOSIS — H02834 Dermatochalasis of left upper eyelid: Secondary | ICD-10-CM | POA: Diagnosis not present

## 2015-10-31 DIAGNOSIS — H02831 Dermatochalasis of right upper eyelid: Secondary | ICD-10-CM | POA: Diagnosis not present

## 2015-10-31 DIAGNOSIS — H2513 Age-related nuclear cataract, bilateral: Secondary | ICD-10-CM | POA: Diagnosis not present

## 2015-10-31 DIAGNOSIS — H02832 Dermatochalasis of right lower eyelid: Secondary | ICD-10-CM | POA: Diagnosis not present

## 2015-10-31 DIAGNOSIS — H524 Presbyopia: Secondary | ICD-10-CM | POA: Diagnosis not present

## 2015-10-31 DIAGNOSIS — H02835 Dermatochalasis of left lower eyelid: Secondary | ICD-10-CM | POA: Diagnosis not present

## 2015-11-05 ENCOUNTER — Ambulatory Visit: Payer: Commercial Managed Care - HMO

## 2015-11-22 ENCOUNTER — Ambulatory Visit: Payer: Commercial Managed Care - HMO | Attending: Internal Medicine | Admitting: Physical Therapy

## 2015-11-22 DIAGNOSIS — M25561 Pain in right knee: Secondary | ICD-10-CM | POA: Diagnosis not present

## 2015-11-22 DIAGNOSIS — M25562 Pain in left knee: Secondary | ICD-10-CM | POA: Insufficient documentation

## 2015-11-22 DIAGNOSIS — R29898 Other symptoms and signs involving the musculoskeletal system: Secondary | ICD-10-CM | POA: Diagnosis not present

## 2015-11-22 DIAGNOSIS — R262 Difficulty in walking, not elsewhere classified: Secondary | ICD-10-CM

## 2015-11-22 DIAGNOSIS — R6889 Other general symptoms and signs: Secondary | ICD-10-CM | POA: Diagnosis not present

## 2015-11-22 NOTE — Patient Instructions (Addendum)
   Copyright  VHI. All rights reserved.  HIP: Flexion / KNEE: Extension, Straight Leg Raise   Raise leg, keeping knee straight. Perform slowly. _10__ reps per set, _3__ sets per day, _7__ days per week may add 2 pounds if complete with no fatigue of pain  Copyright  VHI. All rights reserved.  Heel Slide .  http://gt2.exer.us/372   Copyright  VHI. All rights reserved.     Raise leg until knee is straight. _10__ reps per set, _3_ sets per day, __7_ days per week May add theraband for increased tension or a weight up to 15 pounds as you progress Copyright  VHI. All rights reserved.  Short [employer] a large can or rolled towel under leg. Straighten knee and leg. Hold __3-5__ seconds. Repeat with other leg. Repeat _10 x3___ times. Do ____ sessions per day.  http://gt2.exer.us/365   Copyright  VHI. All rights reserved.  Quad Set   Slowly tighten muscles on thigh of straight leg while counting out loud to _5___.  Add a towel under knee.  Repeat with other leg. Repeat _15 repetions x 3set___ times. Do _1___ sessions per day.  http://gt2.exer.us/361   Copyright  VHI. All rights reserved.   Voncille Lo, PT 11/22/2015 10:53 AM Phone: [phone] Fax: [phone]

## 2015-11-22 NOTE — Therapy (Signed)
[place], [place], [address] Phone: [phone]   Fax:  [phone]  Physical Therapy Evaluation  Patient Details  Name: Crystal [name] MRN: [id number] Date of Birth: 07-14-1951 Referring Provider: Cathlean Cower MD  Encounter Date: 11/22/2015      PT End of Session - 11/22/15 1417    Visit Number 1   Number of Visits 16   Date for PT Re-Evaluation 01/17/16   Authorization Type [employer]   Authorization Time Period 01/17/16   PT Start Time 1017   PT Stop Time 1100   PT Time Calculation (min) 43 min   Activity Tolerance Patient tolerated treatment well   Behavior During Therapy [facility] for tasks assessed/performed      Past Medical History  Diagnosis Date  . ALLERGIC RHINITIS   . ANXIETY   . COLONIC POLYPS, HX OF   . COPD   . Cough   . DEPRESSION   . GERD   . GLUCOSE INTOLERANCE   . HYPERTENSION   . IBS   . OSTEOPENIA   . PERIPHERAL EDEMA   . Pneumonia, organism unspecified   . TOBACCO USE DISORDER/SMOKER-SMOKING CESSATION DISCUSSED   . Impaired glucose tolerance   . Arthritis   . Cataract   . Osteopenia   . Bronchitis     Past Surgical History  Procedure Laterality Date  . Cesarean section    . Cholecystectomy    . Birthmark removed      from back as a child  . Bladder mesh      2012  . Wisdom tooth extraction      There were no vitals filed for this visit.  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  . Dr. Tamala Julian gave me in November cortisone injection in bil knee.    Pertinent History no knee surgery    How long can you sit comfortably? unlimited but if I sit for too long my hip locks  up   How long can you stand comfortably? 20 min   How long can you walk comfortably? 45 min   Diagnostic tests x rays   Currently in Pain? Yes   Pain Score 10-Worst pain ever  at rest 1/10   Pain Location Knee   Pain Orientation Left   Pain Descriptors / Indicators Spasm;Throbbing   Pain Type Chronic pain   Pain Onset More than a month ago   Pain Frequency Intermittent   Aggravating Factors  getting in and out of car, chair, from floor, steps   Pain Relieving Factors medication aspirin and flexeril heat / ice   Multiple Pain Sites Yes   Pain Score 5  1/10 at rest   Pain Location Knee   Pain Orientation Right   Pain Descriptors / Indicators Aching;Burning;Tightness   Pain Onset More than a month ago   Pain Frequency Intermittent   Aggravating Factors  steps and getting on floor            [facility] PT Assessment - 11/22/15 1026    Assessment   Medical Diagnosis Bil Patellofemoral knee pain   Referring Provider Cathlean Cower MD  Onset Date/Surgical Date 05/08/15   Hand Dominance Right   Prior Therapy None   Precautions   Precautions None   Restrictions   Weight Bearing Restrictions No   Balance Screen   Has the patient fallen in the past 6 months No   Has the patient had a decrease in activity level because of a fear of falling?  No   Is the patient reluctant to leave their home because of a fear of falling?  No   Home Ecologist residence   [place] to enter   Entrance Stairs-Number of Steps 2   Entrance Stairs-Rails None   Home Layout One level   Prior Function   Level of Independence Independent   Cognition   Overall Cognitive Status Within Functional Limits for tasks assessed   Observation/Other Assessments   Focus on Therapeutic Outcomes (FOTO)  Intake 42%, liimitation 58% predicted 48%   Posture/Postural Control   Posture/Postural Control Postural limitations   Postural Limitations Rounded  Shoulders;Forward head;Anterior pelvic tilt   Posture Comments wt shift to right and on balls of feet bil   AROM   Right Hip Flexion 125   Left Hip Flexion 122   Right Knee Extension 0   Right Knee Flexion 135   Left Knee Extension 0   Left Knee Flexion 142   Strength   Right Hip Flexion 4+/5   Right Hip ABduction 4/5   Left Hip Flexion 4/5   Left Hip ABduction 4-/5   Right Knee Flexion 4/5   Right Knee Extension 4/5   Left Knee Flexion 4/5   Left Knee Extension 4-/5   Palpation   Patella mobility bilateral knee crepitus   Ambulation/Gait   Ambulation/Gait Yes   Ambulation/Gait Assistance 7: Independent   Ambulation Distance (Feet) 100 Feet   Gait Pattern Decreased stance time - left         There EX   Level 1  Knee exericises SLR 2 x 10 bil, LAQ 1 x 10 bil with red t band Quad set with towel roll, bil 20 x  SAQ 1 x 10 with towel built up under knees bil right and left                  PT Education - 11/22/15 1400    Education provided Yes   Education Details POC Explanation of findings  Level 1 knee exericisies   Person(s) Educated Patient   Methods Explanation;Demonstration;Tactile cues;Verbal cues;Handout   Comprehension Returned demonstration;Verbalized understanding          PT Short Term Goals - 11/22/15 1411    PT SHORT TERM GOAL #1   Title "Independent with initial HEP   Time 4   Period Weeks   Status New   PT SHORT TERM GOAL #2   Title "Report pain decrease at rest from   10/10 to 5  /10.   Time 4   Period Weeks   Status New   PT SHORT TERM GOAL #3   Title Pt will be able to sit to stand pain 4/10 or less to get in and out of car   Time 4   Period Weeks   Status New           PT Long Term Goals - 11/22/15 1413    PT LONG TERM GOAL #1   Title "Pt will be independent with advanced HEP.    Time 8  Period Weeks   Status New   PT LONG TERM GOAL #2   Title "Pain will decrease to 1/10 with all functional activities  including negotiating steps   Time 8   Period Weeks   Status New   PT LONG TERM GOAL #3   Title "Pt will tolerate standing and walking for 2 hours without increased pain in order to return to shopping/walking    Time 8   Period Weeks   Status New   PT LONG TERM GOAL #4   Title "FOTO will improve from 58%    to   40%  indicating improved functional mobility .    Time 8   Period Weeks   Status New   PT LONG TERM GOAL #5   Title "Pt will demonstrate functional squat with symmetrical movement and no exacerbation of pain   Baseline unable to squat ,but wt shift to right in stance   Time 8   Period Weeks   Status New               Plan - 2015/11/27 1048    Clinical Impression Statement Pt is a  65 yo female who c/o if increased bilateral  knee pain for years with audible crepitus in bil knees.   Pt received bil cortisone injections form Dr. Charlann Boxer in november 2016.  pt comes to PT today to maximize strength and functional mobility to avoid surgery for knees. Pt has deficits in neuro muscular control and strength that affect gait, transfers and stairs.    Pt is greatly limited by pain in left knee 10/10 at worst and right 5/10 at worst.   . Pt would benefit from skilled PT for 2 times a week for 8 weeks ( may be decreased to 1 times a week after HEP independent to address above impariments and functional limitations and return to pain-free PLOF   Pt will benefit from skilled therapeutic intervention in order to improve on the following deficits Abnormal gait;Pain;Postural dysfunction;Improper body mechanics;Decreased strength;Difficulty walking;Decreased mobility;Decreased activity tolerance   Rehab Potential Good   PT Frequency 2x / week  may need to do one time a week due to care issue for mother   PT Duration 8 weeks   PT Treatment/Interventions Taping;Dry needling;Cryotherapy;Electrical Stimulation;Iontophoresis 82m/ml Dexamethasone;Moist Heat;ADLs/Self Care Home  Management;Neuromuscular re-education;Patient/family education;Manual techniques;Passive range of motion   PT Next Visit Plan 4 way SLR and core/ clams , wall slide, Pt preparing for Orthovisc injections and needs to maximize knee and core strength, Please add toHEP, Pt care for mother in home and needs to do strengthening at home and is concerned about finanaces   PT Home Exercise Plan LEvel 1 knee with red t band   Consulted and Agree with Plan of Care Patient          G-Codes - [id number]    Functional Assessment Tool Used FOTO   Functional Limitation Mobility: Walking and moving around   Mobility: Walking and Moving Around Current Status [phone] At least 40 percent but less than 60 percent impaired, limited or restricted   Mobility: Walking and Moving Around Goal Status [phone] At least 20 percent but less than 40 percent impaired, limited or restricted       Problem List Patient Active Problem List   Diagnosis Date Noted  . Patellofemoral arthritis 09/03/2015  . Patellofemoral arthritis of left knee 07/24/2015  . Bilateral anterior knee pain 06/03/2012  . Family history of colon cancer 06/03/2012  .  Impaired glucose tolerance 03/27/2011  . Preventative health care 03/27/2011  . Anxiety state 12/20/2009  . DEPRESSION 12/20/2009  . Essential hypertension 12/20/2009  . ALLERGIC RHINITIS 12/20/2009  . GERD 12/20/2009  . IBS 12/20/2009  . OSTEOPENIA 12/20/2009  . PERIPHERAL EDEMA 12/20/2009  . COLONIC POLYPS, HX OF 12/20/2009  . TOBACCO USE DISORDER/SMOKER-SMOKING CESSATION DISCUSSED 12/13/2007  . COPD 12/13/2007   Voncille Lo, PT 11/22/2015 2:28 PM Phone: [phone] Fax: [phone]  By signing I understand that I am ordering/authorizing the use of Iontophoresis using 4 mg/mL of dexamethasone as a component of this plan of care. [place], [place], [address] Phone: [phone]   Fax:   [phone]  Name: Crystal [name] MRN: [id number] Date of Birth: October 14, 1951

## 2015-11-27 ENCOUNTER — Ambulatory Visit: Payer: Commercial Managed Care - HMO | Admitting: Physical Therapy

## 2015-11-27 DIAGNOSIS — M25562 Pain in left knee: Secondary | ICD-10-CM | POA: Diagnosis not present

## 2015-11-27 DIAGNOSIS — R262 Difficulty in walking, not elsewhere classified: Secondary | ICD-10-CM

## 2015-11-27 DIAGNOSIS — R29898 Other symptoms and signs involving the musculoskeletal system: Secondary | ICD-10-CM

## 2015-11-27 DIAGNOSIS — R6889 Other general symptoms and signs: Secondary | ICD-10-CM | POA: Diagnosis not present

## 2015-11-27 DIAGNOSIS — M25561 Pain in right knee: Secondary | ICD-10-CM

## 2015-11-27 NOTE — Therapy (Signed)
[place], [place], [address] Phone: [phone]   Fax:  [phone]  Physical Therapy Treatment  Patient Details  Name: Crystal [name] MRN: [id number] Date of Birth: 1951-08-19 Referring Provider: Cathlean Cower MD  Encounter Date: 11/27/2015      PT End of Session - 11/27/15 0949    Visit Number 2   Number of Visits 16   Date for PT Re-Evaluation --   PT Start Time 2585   PT Stop Time 0940   PT Time Calculation (min) 53 min   Activity Tolerance Patient tolerated treatment well   Behavior During Therapy [facility] for tasks assessed/performed      Past Medical History  Diagnosis Date  . ALLERGIC RHINITIS   . ANXIETY   . COLONIC POLYPS, HX OF   . COPD   . Cough   . DEPRESSION   . GERD   . GLUCOSE INTOLERANCE   . HYPERTENSION   . IBS   . OSTEOPENIA   . PERIPHERAL EDEMA   . Pneumonia, organism unspecified   . TOBACCO USE DISORDER/SMOKER-SMOKING CESSATION DISCUSSED   . Impaired glucose tolerance   . Arthritis   . Cataract   . Osteopenia   . Bronchitis     Past Surgical History  Procedure Laterality Date  . Cesarean section    . Cholecystectomy    . Birthmark removed      from back as a child  . Bladder mesh      2012  . Wisdom tooth extraction      There were no vitals filed for this visit.  Visit Diagnosis:  Bilateral knee pain  Decreased strength involving knee joint  Activity intolerance  Difficulty walking down step      Subjective Assessment - 11/27/15 0855    Subjective Charlie horse in ankle on her fooT ,,  Somrtimrd tskres turns.     Currently in Pain? Yes   Pain Score --  NOT bad.  Did not give a clear answer after sever al attempts.     Pain Location Knee   Pain Orientation Left;Right   Pain Descriptors / Indicators Aching;Spasm;Throbbing   Aggravating Factors   1st step going up steps.  Getting off floor   Pain Relieving Factors cortizone                          [facility] Adult PT Treatment/Exercise - 11/27/15 0001    Ambulation/Gait   Ambulation/Gait Yes   Gait Comments Stop walking barefoot,  stop pounding feet with walking,  tried "Yoga" walking  but it started LT hip pain with IT band insertion, better with stretching    Knee/Hip Exercises: Stretches   Passive Hamstring Stretch 3 reps;30 seconds  strap each   ITB Stretch Left;1 rep;20 seconds  to ease a cramp   Gastroc Stretch 3 reps;30 seconds   Soleus Stretch 3 reps;30 seconds  on step.   Knee/Hip Exercises: Supine   Quad Sets 10 reps;Both   Straight Leg Raises 10 reps;1 set  breathing, cues                  PT Short Term Goals - 11/27/15 0955    PT SHORT TERM GOAL #1   Title "Independent with initial HEP   Baseline minor cues   Time 4   Period Weeks   Status On-going   PT SHORT TERM GOAL #2   Title "Report pain  decrease at rest from   10/10 to 5  /10.   Time 4   Period Weeks   Status On-going   PT SHORT TERM GOAL #3   Title Pt will be able to sit to stand pain 4/10 or less to get in and out of car   Time 4   Period Weeks   Status On-going           PT Long Term Goals - 11/22/15 1413    PT LONG TERM GOAL #1   Title "Pt will be independent with advanced HEP.    Time 8   Period Weeks   Status New   PT LONG TERM GOAL #2   Title "Pain will decrease to 1/10 with all functional activities including negotiating steps   Time 8   Period Weeks   Status New   PT LONG TERM GOAL #3   Title "Pt will tolerate standing and walking for 2 hours without increased pain in order to return to shopping/walking    Time 8   Period Weeks   Status New   PT LONG TERM GOAL #4   Title "FOTO will improve from 58%    to   40%  indicating improved functional mobility .    Time 8   Period Weeks   Status New   PT LONG TERM GOAL #5   Title "Pt will demonstrate functional squat with symmetrical movement and no exacerbation of pain   Baseline  unable to squat ,but wt shift to right in stance   Time 8   Period Weeks   Status New               Plan - 11/27/15 0950    Clinical Impression Statement Cramping intermittantly with exercises.  No increased pain post session,  I feel tired.  Patient did her home exercises 3 X sonce last visit.   PT Next Visit Plan 4 way SLR and core/ clams , wall slide, Pt preparing for Orthovisc injections and needs to maximize knee and core strength, Please add toHEP, Pt care for mother in home and needs to do strengthening at home and is concerned about finanaces   Issue handouts for stretches too.     PT Home Exercise Plan Gastroc. soleus, IT band stretch.     Consulted and Agree with Plan of Care Patient        Problem List Patient Active Problem List   Diagnosis Date Noted  . Patellofemoral arthritis 09/03/2015  . Patellofemoral arthritis of left knee 07/24/2015  . Bilateral anterior knee pain 06/03/2012  . Family history of colon cancer 06/03/2012  . Impaired glucose tolerance 03/27/2011  . Preventative health care 03/27/2011  . Anxiety state 12/20/2009  . DEPRESSION 12/20/2009  . Essential hypertension 12/20/2009  . ALLERGIC RHINITIS 12/20/2009  . GERD 12/20/2009  . IBS 12/20/2009  . OSTEOPENIA 12/20/2009  . PERIPHERAL EDEMA 12/20/2009  . COLONIC POLYPS, HX OF 12/20/2009  . TOBACCO USE DISORDER/SMOKER-SMOKING CESSATION DISCUSSED 12/13/2007  . COPD 12/13/2007    [facility] 11/27/2015, 9:58 AM  [facility] [address] [place], [place], [address] Phone: [phone]   Fax:  [phone]  Name: CALLAHAN PEDDIE MRN: [id number] Date of Birth: 1951/03/18    Melvenia Needles, PTA 11/27/2015 9:58 AM Phone: [phone] Fax: [phone]

## 2015-11-27 NOTE — Patient Instructions (Signed)
Walk softly Avoid walking barefoot.

## 2015-12-04 ENCOUNTER — Ambulatory Visit: Payer: Commercial Managed Care - HMO | Admitting: Physical Therapy

## 2015-12-11 ENCOUNTER — Ambulatory Visit: Payer: Commercial Managed Care - HMO | Admitting: Physical Therapy

## 2015-12-11 DIAGNOSIS — R6889 Other general symptoms and signs: Secondary | ICD-10-CM

## 2015-12-11 DIAGNOSIS — R29898 Other symptoms and signs involving the musculoskeletal system: Secondary | ICD-10-CM | POA: Diagnosis not present

## 2015-12-11 DIAGNOSIS — R262 Difficulty in walking, not elsewhere classified: Secondary | ICD-10-CM

## 2015-12-11 DIAGNOSIS — M25562 Pain in left knee: Principal | ICD-10-CM

## 2015-12-11 DIAGNOSIS — M25561 Pain in right knee: Secondary | ICD-10-CM | POA: Diagnosis not present

## 2015-12-11 NOTE — Therapy (Addendum)
[place], [place], [address] Phone: [phone]   Fax:  [phone]  Physical Therapy Treatment/Discharge Note  Patient Details  Name: NISHITA ISAACKS MRN: [id number] Date of Birth: 11-17-1950 Referring Provider: Cathlean Cower MD  Encounter Date: 12/11/2015      PT End of Session - 12/11/15 1828    Visit Number 3   Number of Visits 16   Date for PT Re-Evaluation 01/17/16   PT Start Time 6948   PT Stop Time 1100   PT Time Calculation (min) 45 min   Activity Tolerance Patient tolerated treatment well   Behavior During Therapy [facility] for tasks assessed/performed      Past Medical History  Diagnosis Date  . ALLERGIC RHINITIS   . ANXIETY   . COLONIC POLYPS, HX OF   . COPD   . Cough   . DEPRESSION   . GERD   . GLUCOSE INTOLERANCE   . HYPERTENSION   . IBS   . OSTEOPENIA   . PERIPHERAL EDEMA   . Pneumonia, organism unspecified   . TOBACCO USE DISORDER/SMOKER-SMOKING CESSATION DISCUSSED   . Impaired glucose tolerance   . Arthritis   . Cataract   . Osteopenia   . Bronchitis     Past Surgical History  Procedure Laterality Date  . Cesarean section    . Cholecystectomy    . Birthmark removed      from back as a child  . Bladder mesh      2012  . Wisdom tooth extraction      There were no vitals filed for this visit.  Visit Diagnosis:  Bilateral knee pain  Decreased strength involving knee joint  Activity intolerance  Difficulty walking down step      Subjective Assessment - 12/11/15 1022    Currently in Pain? No/denies   Multiple Pain Sites No  Back pain 2 to 6/10.  Stiff better                          [facility] Adult PT Treatment/Exercise - 12/11/15 1029    Transfers   Comments able to demo good floor transfer   Self-Care   Self-Care --  ADL handout reviewed, lift practice, demo modifications many   Knee/Hip Exercises: Stretches   ITB Stretch Limitations  handout issued for home   Gastroc Stretch --  handout, patient did not need review   Soleus Stretch --  handout, patient did not need review   Knee/Hip Exercises: Standing   Heel Raises --  handout issued did not need to review   Side Lunges Limitations 3 sets 2 steps, minisquat side step. avoid pain cues given   Forward Step Up Both;1 set;5 reps;Hand Hold: 2;Step Height: 4"   Wall Squat Limitations added to home ball may be helpful   Other Standing Knee Exercises mini squat avoid grinding and pain    Knee/Hip Exercises: Supine   Bridges Limitations 10 X both   Other Supine Knee/Hip Exercises strengthening exercises other than above   Knee/Hip Exercises: Sidelying   Clams 5  red band   Knee/Hip Exercises: Prone   Straight Leg Raises Limitations 5X from knees and elbows                PT Education - 12/11/15 1828    Education provided Yes   Education Details ADL, hip, knee stretches and strengthening   Person(s) Educated Patient   Methods Explanation;Demonstration;Tactile  cues;Verbal cues;Handout   Comprehension Verbalized understanding;Returned demonstration          PT Short Term Goals - 12/11/15 1832    PT SHORT TERM GOAL #1   Title "Independent with initial HEP   Baseline able to do   Time 4   Period Weeks   Status Achieved   PT SHORT TERM GOAL #2   Title "Report pain decrease at rest from   10/10 to 5  /10.   Baseline Minor pain today   Time 4   Period Weeks   Status Partially Met   PT SHORT TERM GOAL #3   Time 4   Period Weeks   Status On-going           PT Long Term Goals - 11/22/15 1413    PT LONG TERM GOAL #1   Title "Pt will be independent with advanced HEP.    Time 8   Period Weeks   Status New   PT LONG TERM GOAL #2   Title "Pain will decrease to 1/10 with all functional activities including negotiating steps   Time 8   Period Weeks   Status New   PT LONG TERM GOAL #3   Title "Pt will tolerate standing and walking for 2 hours  without increased pain in order to return to shopping/walking    Time 8   Period Weeks   Status New   PT LONG TERM GOAL #4   Title "FOTO will improve from 58%    to   40%  indicating improved functional mobility .    Time 8   Period Weeks   Status New   PT LONG TERM GOAL #5   Title "Pt will demonstrate functional squat with symmetrical movement and no exacerbation of pain   Baseline unable to squat ,but wt shift to right in stance   Time 8   Period Weeks   Status New               Plan - 12/11/15 1829    Clinical Impression Statement Education today for ADL and home exercises  for knees and hip.  Pain intermittantly flared during session but eased with change of technique.  A lot of info given today.  She may well have a few questions next visit.  Progress toward home exercise goals.     PT Next Visit Plan review ADL info and new exercises.   PT Home Exercise Plan Hip, knee strength and stretch   Consulted and Agree with Plan of Care Patient        Problem List Patient Active Problem List   Diagnosis Date Noted  . Patellofemoral arthritis 09/03/2015  . Patellofemoral arthritis of left knee 07/24/2015  . Bilateral anterior knee pain 06/03/2012  . Family history of colon cancer 06/03/2012  . Impaired glucose tolerance 03/27/2011  . Preventative health care 03/27/2011  . Anxiety state 12/20/2009  . DEPRESSION 12/20/2009  . Essential hypertension 12/20/2009  . ALLERGIC RHINITIS 12/20/2009  . GERD 12/20/2009  . IBS 12/20/2009  . OSTEOPENIA 12/20/2009  . PERIPHERAL EDEMA 12/20/2009  . COLONIC POLYPS, HX OF 12/20/2009  . TOBACCO USE DISORDER/SMOKER-SMOKING CESSATION DISCUSSED 12/13/2007  . COPD 12/13/2007    [facility] 12/11/2015, 6:33 PM  [facility] [address] [place], [place], [address] Phone: [phone]   Fax:  [phone]  Name: SHARMANE DAME MRN: [id number] Date of Birth: 01/14/1951    Melvenia Needles, PTA 12/11/2015 6:33 PM Phone:  [phone] Fax: [phone]  PHYSICAL THERAPY DISCHARGE SUMMARY  Visits from Start of Care: 3  Current functional level related to goals / functional outcomes: unknown   Remaining deficits: unknown   Education / Equipment: Initial HEP Plan:                                                    Patient goals were not met. Patient is being discharged due to not returning since the last visit.  ?????      Voncille Lo, PT [facility] Adult  10/02/16 4:51 PM Phone: [phone] Fax: [phone]

## 2015-12-11 NOTE — Patient Instructions (Addendum)
Sleeping on Back  Place pillow under knees. A pillow with cervical support and a roll around waist are also helpful. Copyright  VHI. All rights reserved.  Sleeping on Side Place pillow between knees. Use cervical support under neck and a roll around waist as needed. Copyright  VHI. All rights reserved.   Sleeping on Stomach   If this is the only desirable sleeping position, place pillow under lower legs, and under stomach or chest as needed.  Posture - Sitting   Sit upright, head facing forward. Try using a roll to support lower back. Keep shoulders relaxed, and avoid rounded back. Keep hips level with knees. Avoid crossing legs for long periods. Stand to Sit / Sit to Stand   To sit: Bend knees to lower self onto front edge of chair, then scoot back on seat. To stand: Reverse sequence by placing one foot forward, and scoot to front of seat. Use rocking motion to stand up.   Work Height and Reach  Ideal work height is no more than 2 to 4 inches below elbow level when standing, and at elbow level when sitting. Reaching should be limited to arm's length, with elbows slightly bent.  Bending  Bend at hips and knees, not back. Keep feet shoulder-width apart.    Posture - Standing   Good posture is important. Avoid slouching and forward head thrust. Maintain curve in low back and align ears over shoul- ders, hips over ankles.  Alternating Positions   Alternate tasks and change positions frequently to reduce fatigue and muscle tension. Take rest breaks. Computer Work   Position work to Programmer, multimedia. Use proper work and seat height. Keep shoulders back and down, wrists straight, and elbows at right angles. Use chair that provides full back support. Add footrest and lumbar roll as needed.  Getting Into / Out of Car  Lower self onto seat, scoot back, then bring in one leg at a time. Reverse sequence to get out.  Dressing  Lie on back to pull socks or slacks over feet, or sit  and bend leg while keeping back straight.    Housework - Sink  Place one foot on ledge of cabinet under sink when standing at sink for prolonged periods.   Pushing / Pulling  Pushing is preferable to pulling. Keep back in proper alignment, and use leg muscles to do the work.  Deep Squat   Squat and lift with both arms held against upper trunk. Tighten stomach muscles without holding breath. Use smooth movements to avoid jerking.  Avoid Twisting   Avoid twisting or bending back. Pivot around using foot movements, and bend at knees if needed when reaching for articles.  Carrying Luggage   Distribute weight evenly on both sides. Use a cart whenever possible. Do not twist trunk. Move body as a unit.   Lifting Principles .Maintain proper posture and head alignment. .Slide object as close as possible before lifting. .Move obstacles out of the way. .Test before lifting; ask for help if too heavy. .Tighten stomach muscles without holding breath. .Use smooth movements; do not jerk. .Use legs to do the work, and pivot with feet. .Distribute the work load symmetrically and close to the center of trunk. .Push instead of pull whenever possible.   Ask For Help   Ask for help and delegate to others when possible. Coordinate your movements when lifting together, and maintain the low back curve.  Log Roll   Lying on back, bend left knee and place left  arm across chest. Roll all in one movement to the right. Reverse to roll to the left. Always move as one unit. Housework - Sweeping  Use long-handled equipment to avoid stooping.   Housework - Wiping  Position yourself as close as possible to reach work surface. Avoid straining your back.  Laundry - Unloading Wash   To unload small items at bottom of washer, lift leg opposite to arm being used to reach.  [place] close to area to be raked. Use arm movements to do the work. Keep back straight and avoid  twisting.     Cart  When reaching into cart with one arm, lift opposite leg to keep back straight.   Getting Into / Out of Bed  Lower self to lie down on one side by raising legs and lowering head at the same time. Use arms to assist moving without twisting. Bend both knees to roll onto back if desired. To sit up, start from lying on side, and use same move-ments in reverse. Housework - Vacuuming  Hold the vacuum with arm held at side. Step back and forth to move it, keeping head up. Avoid twisting.   Laundry - IT consultant so that bending and twisting can be avoided.   Laundry - Unloading Dryer  Squat down to reach into clothes dryer or use a reacher.  Gardening - Weeding / Probation officer or Kneel. Knee pads may be helpful.                     From exercise drawer:IT, Hamstring, gastroc, soleus, stretching  3 X 25-30 seconds daily Knee strength standing3 to 10 X , 3 - 4 X a week JOSPT hip strengthening 5 to 10 X 3-4 X a week

## 2015-12-18 ENCOUNTER — Ambulatory Visit (AMBULATORY_SURGERY_CENTER): Payer: Self-pay | Admitting: *Deleted

## 2015-12-18 VITALS — Ht 63.5 in | Wt 186.0 lb

## 2015-12-18 DIAGNOSIS — Z800 Family history of malignant neoplasm of digestive organs: Secondary | ICD-10-CM

## 2015-12-18 DIAGNOSIS — Z86010 Personal history of colonic polyps: Secondary | ICD-10-CM

## 2015-12-18 MED ORDER — NA SULFATE-K SULFATE-MG SULF 17.5-3.13-1.6 GM/177ML PO SOLN: ORAL | Status: DC

## 2015-12-18 NOTE — Progress Notes (Signed)
No egg or soy allergy  No anesthesia or intubation problems per pt  No diet medications taken

## 2016-01-01 ENCOUNTER — Ambulatory Visit (AMBULATORY_SURGERY_CENTER): Payer: Commercial Managed Care - HMO | Admitting: Gastroenterology

## 2016-01-01 ENCOUNTER — Encounter: Payer: Self-pay | Admitting: Gastroenterology

## 2016-01-01 VITALS — BP 119/68 | HR 67 | Temp 99.5°F | Resp 9 | Ht 63.5 in | Wt 186.0 lb

## 2016-01-01 DIAGNOSIS — D123 Benign neoplasm of transverse colon: Secondary | ICD-10-CM | POA: Diagnosis not present

## 2016-01-01 DIAGNOSIS — Z86010 Personal history of colonic polyps: Secondary | ICD-10-CM | POA: Diagnosis present

## 2016-01-01 DIAGNOSIS — Z800 Family history of malignant neoplasm of digestive organs: Secondary | ICD-10-CM | POA: Diagnosis not present

## 2016-01-01 DIAGNOSIS — K635 Polyp of colon: Secondary | ICD-10-CM | POA: Diagnosis not present

## 2016-01-01 DIAGNOSIS — D122 Benign neoplasm of ascending colon: Secondary | ICD-10-CM | POA: Diagnosis not present

## 2016-01-01 DIAGNOSIS — D124 Benign neoplasm of descending colon: Secondary | ICD-10-CM

## 2016-01-01 DIAGNOSIS — I10 Essential (primary) hypertension: Secondary | ICD-10-CM | POA: Diagnosis not present

## 2016-01-01 DIAGNOSIS — D125 Benign neoplasm of sigmoid colon: Secondary | ICD-10-CM

## 2016-01-01 MED ORDER — SODIUM CHLORIDE 0.9 % IV SOLN: 500.0000 mL | INTRAVENOUS | Status: DC

## 2016-01-01 NOTE — Patient Instructions (Signed)
Discharge instructions given. Handouts on polyps,diverticulosis and hemorrhoids. Hold aspirin and all NSAIDS for 2 weeks. Resume previous medications. YOU HAD AN ENDOSCOPIC PROCEDURE TODAY AT [facility]:   Refer to the procedure report that was given to you for any specific questions about what was found during the examination.  If the procedure report does not answer your questions, please call your gastroenterologist to clarify.  If you requested that your care partner not be given the details of your procedure findings, then the procedure report has been included in a sealed envelope for you to review at your convenience later.  YOU SHOULD EXPECT: Some feelings of bloating in the abdomen. Passage of more gas than usual.  Walking can help get rid of the air that was put into your GI tract during the procedure and reduce the bloating. If you had a lower endoscopy (such as a colonoscopy or flexible sigmoidoscopy) you may notice spotting of blood in your stool or on the toilet paper. If you underwent a bowel prep for your procedure, you may not have a normal bowel movement for a few days.  Please Note:  You might notice some irritation and congestion in your nose or some drainage.  This is from the oxygen used during your procedure.  There is no need for concern and it should clear up in a day or so.  SYMPTOMS TO REPORT IMMEDIATELY:   Following lower endoscopy (colonoscopy or flexible sigmoidoscopy):  Excessive amounts of blood in the stool  Significant tenderness or worsening of abdominal pains  Swelling of the abdomen that is new, acute  Fever of 100F or higher   For urgent or emergent issues, a gastroenterologist can be reached at any hour by calling [phone].   DIET: Your first meal following the procedure should be a small meal and then it is ok to progress to your normal diet. Heavy or fried foods are harder to digest and may make you feel nauseous or bloated.   Likewise, meals heavy in dairy and vegetables can increase bloating.  Drink plenty of fluids but you should avoid alcoholic beverages for 24 hours.  ACTIVITY:  You should plan to take it easy for the rest of today and you should NOT DRIVE or use heavy machinery until tomorrow (because of the sedation medicines used during the test).    FOLLOW UP: Our staff will call the number listed on your records the next business day following your procedure to check on you and address any questions or concerns that you may have regarding the information given to you following your procedure. If we do not reach you, we will leave a message.  However, if you are feeling well and you are not experiencing any problems, there is no need to return our call.  We will assume that you have returned to your regular daily activities without incident.  If any biopsies were taken you will be contacted by phone or by letter within the next 1-3 weeks.  Please call us at [phone] if you have not heard about the biopsies in 3 weeks.    SIGNATURES/CONFIDENTIALITY: You and/or your care partner have signed paperwork which will be entered into your electronic medical record.  These signatures attest to the fact that that the information above on your After Visit Summary has been reviewed and is understood.  Full responsibility of the confidentiality of this discharge information lies with you and/or your care-partner.

## 2016-01-01 NOTE — Op Note (Signed)
[facility] Patient Name: Crystal [name] Procedure Date: 01/01/2016 9:23 AM MRN: [id number] Endoscopist: Remo Lipps P. Havery Moros , MD Age: 65 Referring MD:  Date of Birth: 25-Jul-1951 Gender: Female Procedure:                Colonoscopy Indications:              Surveillance: Personal history of adenomatous                            polyps on last colonoscopy 3 years ago Medicines:                Monitored Anesthesia Care Procedure:                Pre-Anesthesia Assessment:                           - Prior to the procedure, a History and Physical                            was performed, and patient medications and                            allergies were reviewed. The patient's tolerance of                            previous anesthesia was also reviewed. The risks                            and benefits of the procedure and the sedation                            options and risks were discussed with the patient.                            All questions were answered, and informed consent                            was obtained. Prior Anticoagulants: The patient has                            taken aspirin, last dose was 3 days prior to                            procedure. ASA Grade Assessment: II - A patient                            with mild systemic disease. After reviewing the                            risks and benefits, the patient was deemed in                            satisfactory condition to undergo the procedure.  After obtaining informed consent, the colonoscope                            was passed under direct vision. Throughout the                            procedure, the patient's blood pressure, pulse, and                            oxygen saturations were monitored continuously. The                            Model PCF-H190L [id number]) scope was introduced                            through the anus and advanced to the the  cecum,                            identified by appendiceal orifice and ileocecal                            valve. The quality of the bowel preparation was                            adequate. The ileocecal valve, appendiceal orifice,                            and rectum were photographed. Scope In: 9:26:22 AM Scope Out: 10:03:38 AM Scope Withdrawal Time: 0 hours 21 minutes 32 seconds  Total Procedure Duration: 0 hours 37 minutes 16 seconds  Findings:      The perianal and digital rectal examinations were normal.      A single medium-sized angiodysplastic lesion was found in the ascending       colon.      Two sessile polyps were found in the ascending colon. The polyps were 4       to 6 mm in size. These polyps were removed with a cold snare. Resection       and retrieval were complete.      Two sessile polyps were found in the hepatic flexure. The polyps were 5       to 10 mm in size. These polyps were removed with a cold snare. Resection       and retrieval were complete.      A 10 mm polyp was found in the transverse colon. The polyp was sessile.       The polyp was removed with a hot snare. Resection and retrieval were       complete.      Four sessile polyps were found in the transverse colon. The polyps were       4 to 12 mm in size. These polyps were removed with a cold snare.       Resection and retrieval were complete.      A 4 mm polyp was found in the descending colon. The polyp was sessile.       The polyp was removed with a cold snare. Resection and retrieval were  complete.      A 5 mm polyp was found in the sigmoid colon. The polyp was sessile. The       polyp was removed with a cold snare. Resection and retrieval were       complete.      A 10 mm polyp was found in the sigmoid colon. The polyp was       semi-pedunculated. The polyp was removed with a hot snare. Resection and       retrieval were complete.      Multiple medium-mouthed diverticula were found in  the sigmoid colon and       descending colon.      Non-bleeding internal hemorrhoids were found during retroflexion. The       hemorrhoids were small.      The exam was otherwise without abnormality. Complications:            No immediate complications. Estimated blood loss:                            Minimal. Estimated Blood Loss:     Estimated blood loss was minimal. Impression:               - A single colonic angiodysplastic lesion.                           - Two 4 to 6 mm polyps in the ascending colon,                            removed with a cold snare. Resected and retrieved.                           - Two 5 to 10 mm polyps at the hepatic flexure,                            removed with a cold snare. Resected and retrieved.                           - One 10 mm polyp in the transverse colon, removed                            with a hot snare. Resected and retrieved.                           - Four 4 to 12 mm polyps in the transverse colon,                            removed with a cold snare. Resected and retrieved.                           - One 4 mm polyp in the descending colon, removed                            with a cold snare. Resected and retrieved.                           -  One 5 mm polyp in the sigmoid colon, removed with                            a cold snare. Resected and retrieved.                           - One 10 mm polyp in the sigmoid colon, removed                            with a hot snare. Resected and retrieved.                           - Diverticulosis in the sigmoid colon and in the                            descending colon.                           - Non-bleeding internal hemorrhoids.                           - The examination was otherwise normal. Recommendation:           - Patient has a contact number available for                            emergencies. The signs and symptoms of potential                            delayed complications  were discussed with the                            patient. Return to normal activities tomorrow.                            Written discharge instructions were provided to the                            patient.                           - Resume previous diet.                           - Continue present medications.                           - No aspirin, ibuprofen, naproxen, or other                            non-steroidal anti-inflammatory drugs for 2 weeks                            after polyp removal.                           -  Await pathology results.                           - Repeat colonoscopy for surveillance based on                            pathology results. Procedure Code(s):        --- Professional ---                           [phone], Colonoscopy, flexible; with removal of                            tumor(s), polyp(s), or other lesion(s) by snare                            technique CPT copyright 2016 American Medical Association. All rights reserved. Remo Lipps P. Havery Moros, MD Crystal [name]. Crystal Civello, MD 01/01/2016 10:10:59 AM This report has been signed electronically. Number of Addenda: 0

## 2016-01-01 NOTE — Progress Notes (Signed)
Called to room to assist during endoscopic procedure.  Patient ID and intended procedure confirmed with present staff. Received instructions for my participation in the procedure from the performing physician.  

## 2016-01-01 NOTE — Progress Notes (Signed)
Report to PACU, RN, vss, BBS= Clear.  

## 2016-01-02 ENCOUNTER — Telehealth: Payer: Self-pay | Admitting: Emergency Medicine

## 2016-01-02 NOTE — Telephone Encounter (Signed)
  Follow up Call-  Call back number 01/01/2016  Post procedure Call Back phone  # [phone]  Permission to leave phone message Yes     Patient questions:  Do you have a fever, pain , or abdominal swelling? No. Pain Score  0 *  Have you tolerated food without any problems? Yes.    Have you been able to return to your normal activities? Yes.    Do you have any questions about your discharge instructions: Diet   No. Medications  No. Follow up visit  No.  Do you have questions or concerns about your Care? No.  Actions: * If pain score is 4 or above: No action needed, pain <4.

## 2016-01-07 ENCOUNTER — Telehealth: Payer: Self-pay | Admitting: Family Medicine

## 2016-01-07 DIAGNOSIS — M25561 Pain in right knee: Secondary | ICD-10-CM

## 2016-01-07 NOTE — Telephone Encounter (Signed)
Patient states she has fallen at her mothers dentist office this morning really hard on right knee.  She is requesting to be worked in.  She is also requesting x ray.  Patient will also need [facility] referral before she is seen.

## 2016-01-08 ENCOUNTER — Telehealth: Payer: Self-pay | Admitting: Internal Medicine

## 2016-01-08 ENCOUNTER — Other Ambulatory Visit: Payer: Self-pay | Admitting: Internal Medicine

## 2016-01-08 DIAGNOSIS — Z86010 Personal history of colonic polyps: Secondary | ICD-10-CM

## 2016-01-08 NOTE — Telephone Encounter (Signed)
Spoke to pt, entered order for her to come in & have x-ray done. Entered referral for insurance & scheduled pt w/ Dr. Tamala Julian on 3.23.17.

## 2016-01-08 NOTE — Telephone Encounter (Signed)
Pt called in and would like a referral to the cancer center for genetic concealing

## 2016-01-08 NOTE — Telephone Encounter (Signed)
Needs genetic testing referral to be changed from pediatric to [facility].

## 2016-01-08 NOTE — Telephone Encounter (Signed)
Please advise, can we do this

## 2016-01-08 NOTE — Telephone Encounter (Signed)
This has been ordered, thanks

## 2016-01-08 NOTE — Telephone Encounter (Signed)
Done, thanks

## 2016-01-09 ENCOUNTER — Ambulatory Visit (INDEPENDENT_AMBULATORY_CARE_PROVIDER_SITE_OTHER)
Admission: RE | Admit: 2016-01-09 | Discharge: 2016-01-09 | Disposition: A | Discharge status: Home / Self Care | Payer: Commercial Managed Care - HMO | Source: Ambulatory Visit | Attending: Family Medicine | Admitting: Family Medicine

## 2016-01-09 DIAGNOSIS — M25561 Pain in right knee: Secondary | ICD-10-CM

## 2016-01-09 DIAGNOSIS — S8991XA Unspecified injury of right lower leg, initial encounter: Secondary | ICD-10-CM | POA: Diagnosis not present

## 2016-01-10 ENCOUNTER — Ambulatory Visit: Payer: Commercial Managed Care - HMO

## 2016-01-10 ENCOUNTER — Encounter: Payer: Self-pay | Admitting: Family Medicine

## 2016-01-10 ENCOUNTER — Ambulatory Visit (INDEPENDENT_AMBULATORY_CARE_PROVIDER_SITE_OTHER): Payer: Commercial Managed Care - HMO | Admitting: Family Medicine

## 2016-01-10 VITALS — BP 124/84 | HR 91 | Ht 63.5 in | Wt 186.0 lb

## 2016-01-10 DIAGNOSIS — M13169 Monoarthritis, not elsewhere classified, unspecified knee: Secondary | ICD-10-CM | POA: Diagnosis not present

## 2016-01-10 NOTE — Progress Notes (Signed)
Pre visit review using our clinic review tool, if applicable. No additional management support is needed unless otherwise documented below in the visit note. 

## 2016-01-10 NOTE — Progress Notes (Signed)
[name] [address], [place] [address] Phone: [phone] Subjective:    I'm seeing this patient by the request  of:  Cathlean Cower, MD   CC: right knee pain SEG:[id number] Crystal [name] is a 65 y.o. female coming in with complaint of right knee pain. Patient unfortunately fell on Monday at a dentist office. Had significant bruising and swelling immediately. Had difficulty ambulating for 24 hours. Patient states over the course of the last 72 hours she has been able to start walking more regularly. States that the swelling is improving. Bruising is dissipating. Has known osteoarthritic changes of patellofemoral arthritis. Patient has done some over-the-counter medications with good resolution of pain. States that she is walking fairly normal at this time. Initially did have 7 out of 10 pain. Patient did have x-rays that were in apparently visualized by me that shows severe patellofemoral arthritis but no significant acute bony normality.     Past Medical History  Diagnosis Date  . ALLERGIC RHINITIS   . COLONIC POLYPS, HX OF   . Cough   . DEPRESSION   . GERD   . GLUCOSE INTOLERANCE   . HYPERTENSION   . IBS   . OSTEOPENIA   . PERIPHERAL EDEMA   . Pneumonia, organism unspecified   . TOBACCO USE DISORDER/SMOKER-SMOKING CESSATION DISCUSSED   . Impaired glucose tolerance   . Arthritis   . Osteopenia   . Bronchitis   . Allergy   . ANXIETY     no medations needed  . Cataract     no surgery  . COPD     pt unsure of this dx   Past Surgical History  Procedure Laterality Date  . Cesarean section      x2  . Cholecystectomy    . Birthmark removed      from back as a child  . Bladder mesh      2012  . Wisdom tooth extraction    . Colonoscopy     Social History   Social History  . Marital Status: Widowed    Spouse Name: N/A  . Number of Children: N/A  . Years of Education: N/A   Occupational History  . 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 Smoking status: Former Smoker -- 0.50 packs/day for .5 years  . Smokeless tobacco: Never Used     Comment: stopped 12-11-15  . Alcohol Use: 7.0 oz/week    14 Standard drinks or equivalent per week     Comment: couple drinks every night  . Drug Use: No  . Sexual Activity: Not Asked   Other Topics Concern  . None   Social History Narrative   Allergies  Allergen Reactions  . Ace Inhibitors Cough  . Penicillins Rash   Family History  Problem Relation Age of Onset  . Breast cancer Sister     x 2  . Anxiety disorder    . Hypothyroidism    . Hyperlipidemia    . Diabetes    . Colon cancer Father   . Colon cancer Paternal Grandfather   . Esophageal cancer Neg Hx   . Stomach cancer Neg Hx   . Rectal cancer Neg Hx     Past medical history, social, surgical and family history all reviewed in electronic medical record.  No pertanent information unless stated regarding to the chief complaint.   Review of Systems: No headache, visual  changes, nausea, vomiting, diarrhea, constipation, dizziness, abdominal pain, skin rash, fevers, chills, night sweats, weight loss, swollen lymph nodes, body aches, joint swelling, muscle aches, chest pain, shortness of breath, mood changes.   Objective Blood pressure 124/84, pulse 91, height 5' 3.5" (1.613 m), weight 186 lb (84.369 kg), SpO2 94 %.  General: No apparent distress alert and oriented x3 mood and affect normal, dressed appropriately.  HEENT: Pupils equal, extraocular movements intact  Respiratory: Patient's speak in full sentences and does not appear short of breath  Cardiovascular: No lower extremity edema, non tender, no erythema  Skin: Warm dry intact with no signs of infection or rash on extremities or on axial skeleton.  Abdomen: Soft nontender  Neuro: Cranial nerves II through XII are intact, neurovascularly intact in all extremities with 2+ DTRs and 2+ pulses.    Lymph: No lymphadenopathy of posterior or anterior cervical chain or axillae bilaterally.  Gait normal with good balance and coordination.  MSK:  Non tender with full range of motion and good stability and symmetric strength and tone of shoulders, elbows, wrist, hip, and ankles bilaterally. Arthritic changes of most joints Knee:right Effusion noted with resolving bruising Tender over the patellofemoral joint with mild crepitus noted ROM full in flexion and extension and lower leg rotation. Ligaments with solid consistent endpoints including ACL, PCL, LCL, MCL. Negative Mcmurray's, Apley's, and Thessalonian tests. painful patellar compression. Patellar and quadriceps tendons unremarkable. Hamstring and quadriceps strength is normal.  Contralateral knee has crepitus of the patellofemoral joint as well.     Impression and Recommendations:     This case required medical decision making of moderate complexity.      Note: This dictation was prepared with Dragon dictation along with smaller phrase technology. Any transcriptional errors that result from this process are unintentional.

## 2016-01-10 NOTE — Assessment & Plan Note (Signed)
Patient is a patellofemoral arthritis. We discussed with patient about different treatment options. Patient did have an effusion today after this fall and I do think that this is an exacerbation. Patient did not want aspiration done today. I would like to follow-up again in 10 days to make sure she is healing appropriately. We discussed also viscous supplementation with the amount of arthritis that she has. Patient will consider this in 10 days. Patient and will come back and see me again  in 10 days as well as discussed. Spent  25 minutes with patient face-to-face and had greater than 50% of counseling including as described above in assessment and plan.

## 2016-01-10 NOTE — Patient Instructions (Signed)
Good to see you  Ice is your friend Compression sleeve as well Arnica lotion Lets see you again in 2 weeks to make sure fluid resolves.  At that time we can start orthovisc if you want.

## 2016-01-11 DIAGNOSIS — Z1231 Encounter for screening mammogram for malignant neoplasm of breast: Secondary | ICD-10-CM | POA: Diagnosis not present

## 2016-01-11 DIAGNOSIS — Z803 Family history of malignant neoplasm of breast: Secondary | ICD-10-CM | POA: Diagnosis not present

## 2016-01-15 DIAGNOSIS — N6001 Solitary cyst of right breast: Secondary | ICD-10-CM | POA: Diagnosis not present

## 2016-01-15 DIAGNOSIS — Z803 Family history of malignant neoplasm of breast: Secondary | ICD-10-CM | POA: Diagnosis not present

## 2016-01-15 DIAGNOSIS — N6489 Other specified disorders of breast: Secondary | ICD-10-CM | POA: Diagnosis not present

## 2016-01-17 ENCOUNTER — Encounter: Payer: Self-pay | Admitting: Genetic Counselor

## 2016-01-17 ENCOUNTER — Telehealth: Payer: Self-pay | Admitting: Genetic Counselor

## 2016-01-17 NOTE — Telephone Encounter (Signed)
verifed address and insurance, requested auth from PCP, in basket the referral office regarding date, time, and need for autho., mailed out new pt packet, completed intake with pt.

## 2016-01-23 DIAGNOSIS — Z1283 Encounter for screening for malignant neoplasm of skin: Secondary | ICD-10-CM | POA: Diagnosis not present

## 2016-01-23 DIAGNOSIS — B351 Tinea unguium: Secondary | ICD-10-CM | POA: Diagnosis not present

## 2016-01-23 DIAGNOSIS — D225 Melanocytic nevi of trunk: Secondary | ICD-10-CM | POA: Diagnosis not present

## 2016-01-24 ENCOUNTER — Ambulatory Visit (INDEPENDENT_AMBULATORY_CARE_PROVIDER_SITE_OTHER): Payer: Commercial Managed Care - HMO | Admitting: Family Medicine

## 2016-01-24 ENCOUNTER — Encounter: Payer: Self-pay | Admitting: Family Medicine

## 2016-01-24 VITALS — BP 132/82 | HR 91 | Ht 63.5 in | Wt 187.0 lb

## 2016-01-24 DIAGNOSIS — M949 Disorder of cartilage, unspecified: Secondary | ICD-10-CM | POA: Diagnosis not present

## 2016-01-24 DIAGNOSIS — M899 Disorder of bone, unspecified: Secondary | ICD-10-CM

## 2016-01-24 DIAGNOSIS — M13169 Monoarthritis, not elsewhere classified, unspecified knee: Secondary | ICD-10-CM | POA: Diagnosis not present

## 2016-01-24 NOTE — Progress Notes (Signed)
Pre visit review using our clinic review tool, if applicable. No additional management support is needed unless otherwise documented below in the visit note. 

## 2016-01-24 NOTE — Assessment & Plan Note (Signed)
Patient given bilateral injections today. Patient did have some mild effusion again noted today. Patient did respond well to the injection so. We discussed icing regimen and home exercises. Discussed which activities to do an which was potentially avoid. Patient wants to avoid any type of bracing. Does have topical anti-inflammatories. Patient will come back and see me again in 3 weeks. At that time if worsening symptoms she could be a candidate for viscous supple mentation.

## 2016-01-24 NOTE — Assessment & Plan Note (Signed)
Discussed with patient about continuing vitamin D supplementation.

## 2016-01-24 NOTE — Progress Notes (Signed)
[name] [place], [place] [address] Phone: [phone] Subjective:    I'm seeing this patient by the request  of:  Cathlean Cower, MD   CC: right knee pain f/u  [id number] Crystal [name] is a 65 y.o. female coming in with complaint of right knee pain.patient did have a fall previously and did have a contusion of the knee. Patient has been doing conservative therapy and states that she is near back to her baseline. Unfortunately both of her knees are starting to hurt again. Did have steroid injections back in October. Did respond fairly well for quite some time. Patient is wondering if she should do viscous supplementation. 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 Patient denies any significant instability at this time. States though that there is significant grinding when she flexes the knees especially going up stairs.     Past Medical History  Diagnosis Date  . ALLERGIC RHINITIS   . COLONIC POLYPS, HX OF   . Cough   . DEPRESSION   . GERD   . GLUCOSE INTOLERANCE   . HYPERTENSION   . IBS   . OSTEOPENIA   . PERIPHERAL EDEMA   . Pneumonia, organism unspecified   . TOBACCO USE DISORDER/SMOKER-SMOKING CESSATION DISCUSSED   . Impaired glucose tolerance   . Arthritis   . Osteopenia   . Bronchitis   . Allergy   . ANXIETY     no medations needed  . Cataract     no surgery  . COPD     pt unsure of this dx   Past Surgical History  Procedure Laterality Date  . Cesarean section      x2  . Cholecystectomy    . Birthmark removed      from back as a child  . Bladder mesh      2012  . Wisdom tooth extraction    . Colonoscopy     Social History   Social History  . Marital Status: Widowed    Spouse Name: N/A  . Number of Children: N/A  . Years of Education: N/A   Occupational History  . 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 Smoking status:  Former Smoker -- 0.50 packs/day for .5 years  . Smokeless tobacco: Never Used     Comment: stopped 12-11-15  . Alcohol Use: 7.0 oz/week    14 Standard drinks or equivalent per week     Comment: couple drinks every night  . Drug Use: No  . Sexual Activity: Not Asked   Other Topics Concern  . None   Social History Narrative   Allergies  Allergen Reactions  . Ace Inhibitors Cough  . Penicillins Rash   Family History  Problem Relation Age of Onset  . Breast cancer Sister     x 2  . Anxiety disorder    . Hypothyroidism    . Hyperlipidemia    . Diabetes    . Colon cancer Father   . Colon cancer Paternal Grandfather   . Esophageal cancer Neg Hx   . Stomach cancer Neg Hx   . Rectal cancer Neg Hx     Past medical history, social, surgical and family history all reviewed in electronic medical record.  No pertanent information unless stated regarding to the chief complaint.   Review of Systems: No headache, visual changes, nausea, vomiting, diarrhea, constipation, dizziness, abdominal pain, skin  rash, fevers, chills, night sweats, weight loss, swollen lymph nodes, body aches, joint swelling, muscle aches, chest pain, shortness of breath, mood changes.   Objective Blood pressure 132/82, pulse 91, height 5' 3.5" (1.613 m), weight 187 lb (84.823 kg), SpO2 98 %.  General: No apparent distress alert and oriented x3 mood and affect normal, dressed appropriately.  HEENT: Pupils equal, extraocular movements intact  Respiratory: Patient's speak in full sentences and does not appear short of breath  Cardiovascular: No lower extremity edema, non tender, no erythema  Skin: Warm dry intact with no signs of infection or rash on extremities or on axial skeleton.  Abdomen: Soft nontender  Neuro: Cranial nerves II through XII are intact, neurovascularly intact in all extremities with 2+ DTRs and 2+ pulses.  Lymph: No lymphadenopathy of posterior or anterior cervical chain or axillae bilaterally.    Gait normal with good balance and coordination.  MSK:  Non tender with full range of motion and good stability and symmetric strength and tone of shoulders, elbows, wrist, hip, and ankles bilaterally. Arthritic changes of most joints Knee:bilateral mild effusion notedbut no bruising Tender over the patellofemoral joint with mild crepitus noted ROM full in flexion and extension and lower leg rotation. Ligaments with solid consistent endpoints including ACL, PCL, LCL, MCL. Negative Mcmurray's, Apley's, and Thessalonian tests. painful patellar compression. Patellar and quadriceps tendons unremarkable. Hamstring and quadriceps strength is normal.   After informed written and verbal consent, patient was seated on exam table. Right knee was prepped with alcohol swab and utilizing anterolateral approach, patient's right knee space was injected with 4:1  marcaine 0.5%: Kenalog 1m/dL. Patient tolerated the procedure well without immediate complications.  After informed written and verbal consent, patient was seated on exam table. Left knee was prepped with alcohol swab and utilizing anterolateral approach, patient's left knee space was injected with 4:1  marcaine 0.5%: Kenalog 431mdL. Patient tolerated the procedure well without immediate complications.     Impression and Recommendations:     This case required medical decision making of moderate complexity.      Note: This dictation was prepared with Dragon dictation along with smaller phrase technology. Any transcriptional errors that result from this process are unintentional.

## 2016-01-24 NOTE — Patient Instructions (Signed)
Good to see you Ice is your friend We injected both knees again today  We can start the synvisc as well if we need to within the next 3 months.  Keep doing everything else you are doing.  Make an appointment at least in 3 weeks and then if doing well move it back another 3 weeks and etc.

## 2016-02-15 ENCOUNTER — Ambulatory Visit (INDEPENDENT_AMBULATORY_CARE_PROVIDER_SITE_OTHER): Payer: Commercial Managed Care - HMO | Admitting: Family Medicine

## 2016-02-15 ENCOUNTER — Encounter: Payer: Self-pay | Admitting: Family Medicine

## 2016-02-15 VITALS — BP 122/78 | HR 92 | Ht 63.5 in | Wt 188.0 lb

## 2016-02-15 DIAGNOSIS — M13169 Monoarthritis, not elsewhere classified, unspecified knee: Secondary | ICD-10-CM

## 2016-02-15 NOTE — Patient Instructions (Signed)
Good to see you  Ice still is good Consider the brace First injections will help but will be slow.  Keep up with the exercises [employer] orthotics "total support" online would be great Try [employer].  See you soon!

## 2016-02-15 NOTE — Progress Notes (Signed)
Pre visit review using our clinic review tool, if applicable. No additional management support is needed unless otherwise documented below in the visit note. 

## 2016-02-16 ENCOUNTER — Encounter: Payer: Self-pay | Admitting: Family Medicine

## 2016-02-16 NOTE — Assessment & Plan Note (Signed)
Patient failed all other conservative therapy and we did start patient on viscous supplementation. First injection in a series of 3's given today. Patient will come back next week. Continue conservative therapy. We discussed possible side effects. Discussed potential of bracing which patient declined. Declined repeating formal physical therapy at this time. Follow-up in one week for second in a series of 4 injections.  Spent  25 minutes with patient face-to-face and had greater than 50% of counseling including as described above in assessment and plan.

## 2016-02-16 NOTE — Progress Notes (Signed)
[name] [place], [place] [address] Phone: [phone] Subjective:    I'm seeing this patient by the request  of:  Cathlean Cower, MD   CC: right knee pain f/u  [id number] Crystal [name] is a 65 y.o. female coming in with complaint of right knee pain.patient did have a fall previously and did have a contusion of the knee. Found to have significant arthritic changes. Patient did have injections with corticosteroids last month. States that she is feeling 25% better. Still not great. Still gives her difficulty with daily activities.     Past Medical History  Diagnosis Date  . ALLERGIC RHINITIS   . COLONIC POLYPS, HX OF   . Cough   . DEPRESSION   . GERD   . GLUCOSE INTOLERANCE   . HYPERTENSION   . IBS   . OSTEOPENIA   . PERIPHERAL EDEMA   . Pneumonia, organism unspecified   . TOBACCO USE DISORDER/SMOKER-SMOKING CESSATION DISCUSSED   . Impaired glucose tolerance   . Arthritis   . Osteopenia   . Bronchitis   . Allergy   . ANXIETY     no medations needed  . Cataract     no surgery  . COPD     pt unsure of this dx   Past Surgical History  Procedure Laterality Date  . Cesarean section      x2  . Cholecystectomy    . Birthmark removed      from back as a child  . Bladder mesh      2012  . Wisdom tooth extraction    . Colonoscopy     Social History   Social History  . Marital Status: Widowed    Spouse Name: N/A  . Number of Children: N/A  . Years of Education: N/A   Occupational History  . 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 Smoking status: Former Smoker -- 0.50 packs/day for .5 years  . Smokeless tobacco: Never Used     Comment: stopped 12-11-15  . Alcohol Use: 7.0 oz/week    14 Standard drinks or equivalent per week     Comment: couple drinks every night  . Drug Use: No  . Sexual Activity: Not Asked   Other Topics Concern  . None   Social  History Narrative   Allergies  Allergen Reactions  . Ace Inhibitors Cough  . Penicillins Rash   Family History  Problem Relation Age of Onset  . Breast cancer Sister     x 2  . Anxiety disorder    . Hypothyroidism    . Hyperlipidemia    . Diabetes    . Colon cancer Father   . Colon cancer Paternal Grandfather   . Esophageal cancer Neg Hx   . Stomach cancer Neg Hx   . Rectal cancer Neg Hx     Past medical history, social, surgical and family history all reviewed in electronic medical record.  No pertanent information unless stated regarding to the chief complaint.   Review of Systems: No headache, visual changes, nausea, vomiting, diarrhea, constipation, dizziness, abdominal pain, skin rash, fevers, chills, night sweats, weight loss, swollen lymph nodes, body aches, joint swelling, muscle aches, chest pain, shortness of breath, mood changes.   Objective Blood pressure 122/78, pulse 92, height 5' 3.5" (1.613 m), weight 188 lb (85.276 kg), SpO2 97 %.  General: No apparent distress alert and oriented  x3 mood and affect normal, dressed appropriately.  HEENT: Pupils equal, extraocular movements intact  Respiratory: Patient's speak in full sentences and does not appear short of breath  Cardiovascular: No lower extremity edema, non tender, no erythema  Skin: Warm dry intact with no signs of infection or rash on extremities or on axial skeleton.  Abdomen: Soft nontender  Neuro: Cranial nerves II through XII are intact, neurovascularly intact in all extremities with 2+ DTRs and 2+ pulses.  Lymph: No lymphadenopathy of posterior or anterior cervical chain or axillae bilaterally.  Gait normal with good balance and coordination.  MSK:  Non tender with full range of motion and good stability and symmetric strength and tone of shoulders, elbows, wrist, hip, and ankles bilaterally. Arthritic changes of most joints Knee:bilateral mild effusion notedbut no bruising Tender over the  patellofemoral joint with mild crepitus noted ROM full in flexion and extension and lower leg rotation. Ligaments with solid consistent endpoints including ACL, PCL, LCL, MCL. Negative Mcmurray's, Apley's, and Thessalonian tests. painful patellar compression. Patellar and quadriceps tendons unremarkable. Hamstring and quadriceps strength is normal.  Minimal change from previous exam  After informed written and verbal consent, patient was seated on exam table. Right knee was prepped with alcohol swab and utilizing anterolateral approach, patient's right knee space was injected with 16 mg/2.5 mL of Synvisc (sodium hyaluronate) in a prefilled syringe was injected easily into the knee through a 22-gauge needle.. Patient tolerated the procedure well without immediate complications.  After informed written and verbal consent, patient was seated on exam table. Left knee was prepped with alcohol swab and utilizing anterolateral approach, patient's left knee space was injected with16 mg/2.5 mL of Synvisc (sodium hyaluronate) in a prefilled syringe was injected easily into the knee through a 22-gauge needle.. Patient tolerated the procedure well without immediate complications.     Impression and Recommendations:     This case required medical decision making of moderate complexity.      Note: This dictation was prepared with Dragon dictation along with smaller phrase technology. Any transcriptional errors that result from this process are unintentional.

## 2016-02-18 ENCOUNTER — Encounter: Payer: Self-pay | Admitting: Internal Medicine

## 2016-02-21 ENCOUNTER — Ambulatory Visit (HOSPITAL_BASED_OUTPATIENT_CLINIC_OR_DEPARTMENT_OTHER): Payer: Commercial Managed Care - HMO | Admitting: Genetic Counselor

## 2016-02-21 ENCOUNTER — Other Ambulatory Visit: Payer: Commercial Managed Care - HMO

## 2016-02-21 DIAGNOSIS — Z808 Family history of malignant neoplasm of other organs or systems: Secondary | ICD-10-CM

## 2016-02-21 DIAGNOSIS — D126 Benign neoplasm of colon, unspecified: Secondary | ICD-10-CM | POA: Diagnosis not present

## 2016-02-21 DIAGNOSIS — Z803 Family history of malignant neoplasm of breast: Secondary | ICD-10-CM | POA: Diagnosis not present

## 2016-02-21 DIAGNOSIS — Z86010 Personal history of colonic polyps: Secondary | ICD-10-CM

## 2016-02-21 DIAGNOSIS — Z800 Family history of malignant neoplasm of digestive organs: Secondary | ICD-10-CM

## 2016-02-21 DIAGNOSIS — Z840 Family history of diseases of the skin and subcutaneous tissue: Secondary | ICD-10-CM

## 2016-02-21 DIAGNOSIS — Z8052 Family history of malignant neoplasm of bladder: Secondary | ICD-10-CM

## 2016-02-22 ENCOUNTER — Encounter: Payer: Self-pay | Admitting: Family Medicine

## 2016-02-22 ENCOUNTER — Encounter: Payer: Self-pay | Admitting: Genetic Counselor

## 2016-02-22 ENCOUNTER — Ambulatory Visit (INDEPENDENT_AMBULATORY_CARE_PROVIDER_SITE_OTHER): Payer: Commercial Managed Care - HMO | Admitting: Family Medicine

## 2016-02-22 VITALS — BP 126/80 | HR 74 | Wt 189.0 lb

## 2016-02-22 DIAGNOSIS — M13169 Monoarthritis, not elsewhere classified, unspecified knee: Secondary | ICD-10-CM

## 2016-02-22 DIAGNOSIS — M170 Bilateral primary osteoarthritis of knee: Secondary | ICD-10-CM | POA: Diagnosis not present

## 2016-02-22 DIAGNOSIS — Z803 Family history of malignant neoplasm of breast: Secondary | ICD-10-CM | POA: Insufficient documentation

## 2016-02-22 DIAGNOSIS — D126 Benign neoplasm of colon, unspecified: Secondary | ICD-10-CM | POA: Insufficient documentation

## 2016-02-22 DIAGNOSIS — M1712 Unilateral primary osteoarthritis, left knee: Secondary | ICD-10-CM

## 2016-02-22 NOTE — Progress Notes (Signed)
REFERRING PROVIDER: Biagio Borg, MD [place], [place] [address]  PRIMARY PROVIDER:  Cathlean Cower, MD  PRIMARY REASON FOR VISIT:  1. COLONIC POLYPS, HX OF   2. Family history of colon cancer   3. Colon adenomas   4. Family history of breast cancer in sister   39. Family history of bladder cancer   6. Family history of melanoma   7. Family history of nonmelanoma skin cancer   8. Family history of thyroid cancer      HISTORY OF PRESENT ILLNESS:   [name], a 65 y.o. female, was seen for a [facility] cancer genetics consultation at the request of Dr. Jenny Reichmann due to a personal history 14+ multiple adenomatous colon polyps and family history of colon, breast, bladder, and other cancers.  Crystal [name] presents to clinic today to discuss the possibility of a hereditary predisposition to cancer, genetic testing, and to further clarify her future cancer risks, as well as potential cancer risks for family members.   In March 2017, at the age of 70, Crystal [name] was diagnosed with 12 polyps of the sigmoid, transverse, ascending, hepatic flexure, or descending areas of the colon. Of the tissue fragments obtained, greater than 85% was adenomatous.  At least three adenomatous polyps were found on colonoscopies prior to 2017.  Crystal [name] is a 65 y.o. female with no personal history of cancer.    HORMONAL RISK FACTORS:  Menarche was at age 62.  First live birth at age 20.  OCP use for approximately- history of birth control pills and IUD; has not used since 1979 Ovaries intact: yes.  Hysterectomy: no.  Menopausal status: postmenopausal.  HRT use: 0 years. Colonoscopy: yes; history of multiple adenomas and hyperplastic polyps. Mammogram within the last year: yes. Number of breast biopsies: 0. Up to date with pelvic exams:  yes. Any excessive radiation exposure in the past:  no  Past Medical History  Diagnosis Date  . ALLERGIC RHINITIS   . COLONIC POLYPS, HX OF   . Cough   .  DEPRESSION   . GERD   . GLUCOSE INTOLERANCE   . HYPERTENSION   . IBS   . OSTEOPENIA   . PERIPHERAL EDEMA   . Pneumonia, organism unspecified   . TOBACCO USE DISORDER/SMOKER-SMOKING CESSATION DISCUSSED   . Impaired glucose tolerance   . Arthritis   . Osteopenia   . Bronchitis   . Allergy   . ANXIETY     no medations needed  . Cataract     no surgery  . COPD     pt unsure of this dx    Past Surgical History  Procedure Laterality Date  . Cesarean section      x2  . Cholecystectomy    . Birthmark removed      from back as a child  . Bladder mesh      2012  . Wisdom tooth extraction    . Colonoscopy      Social History   Social History  . Marital Status: Widowed    Spouse Name: N/A  . Number of Children: N/A  . Years of Education: N/A   Occupational History  . 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 Smoking status: Former Smoker -- 1.50 packs/day for 35 years    Types: Cigarettes    Quit date: 12/13/2015  . Smokeless tobacco: Never Used     Comment:  stopped 12-13-15  . Alcohol Use: 7.0 oz/week    14 Standard drinks or equivalent per week     Comment: couple drinks every night  . Drug Use: No  . Sexual Activity: Not Asked   Other Topics Concern  . None   Social History Narrative     FAMILY HISTORY:  We obtained a detailed, 4-generation family history.  Significant diagnoses are listed below: Family History  Problem Relation Age of Onset  . Breast cancer Sister 40    +lump and tamoxifen; eventually had bilateral mastectomies  . Anxiety disorder    . Hypothyroidism    . Hyperlipidemia    . Diabetes    . Colon cancer Father     dx. >38  . Bladder Cancer Father     dx. 59-79; not a smoker  . Skin cancer Father     basal cell carcinoma  . Colon cancer Paternal Grandfather     unspecified age  . Melanoma Paternal Grandfather     unspecified age  . Esophageal cancer Neg Hx   . Stomach cancer  Neg Hx   . Rectal cancer Neg Hx   . Skin cancer Mother     NOS; dx. 62s  . Other Mother     hx of hysterectomy in her early 29s for fibroid cysts  . Heart attack Maternal Uncle 67  . Heart attack Maternal Grandmother 62  . Thyroid cancer Paternal Grandmother 88  . Breast cancer Sister     dx. early 6s; s/p BL mastectomies  . Cervical cancer Sister     dx. late 54s  . Skin cancer Brother     NOS    Crystal [name] has one son who is 58 and one daughter who is 43.  Her son has one 42-year old son and another son on the way.  Crystal [name] has two full sisters (currently 31 and 39), both of whom have had breast cancer--one at 51 and the other in her early 8s and both have since had bilateral mastectomies.  Her latter sister also has a history of cervical cancer in her late 29s.  Crystal [name] also has two full brothers, ages 38 and 11.  Her oldest brother has a history of an unspecified type of skin cancer.  Crystal [name] mother is currently 22 and has a history of skin cancer, diagnosed in her 70s.  Her mother also has a history of a hysterectomy in her early 80s for fibroid cysts.  Crystal [name] father passed away at 78.  He had a history of bladder cancer at 78-79 and colon cancer diagnosed afterward.  He also had a history of basal cell carcinoma skin cancer.    Crystal [name] father had one full brother who died shortly after birth of an unspecified cause.  His mother died of thyroid cancer at 18.  His father died when Crystal [name] was [age].  He had a history of melanoma and colon cancers diagnosed at unspecified ages.  Crystal [name] reports no known cancer history for more distant paternal relatives.    Crystal [name] mother had one full sister and two full brothers.  Her sister died of pneumonia in infancy.  Her father died of a heart attack at 16.  She has no information for her other uncle or for her maternal first cousins.  Her maternal grandmother died of a heart attack at 74.  Her grandfather died  of "natural causes" in his sleep; she reports that he worked  at a [employer].  She has no further information for any of her maternal great aunts/uncles or great grandparents.  She reports no known previous family history of genetic testing for hereditary cancer.  Patient's maternal ancestors are of Caucasian/[place] descent, and paternal ancestors are of Caucasian, possibly [place] descent. There is no reported Ashkenazi Jewish ancestry. There is no known consanguinity.  GENETIC COUNSELING ASSESSMENT: JARELY JUNCAJ is a 65 y.o. female with a personal history of multiple adenomatous colon polyps and family history of colon and breast cancers which is somewhat suggestive of a hereditary polyposis/colon cancer syndrome and/or hereditary breast cancer syndrome and predisposition to cancer. We, therefore, discussed and recommended the following at today's visit.   DISCUSSION: We reviewed the characteristics, features and inheritance patterns of hereditary cancer syndromes, particularly those caused by mutations within the APC, MUTYH, Lynch syndrome, and BRCA1/2, agenes. We also discussed genetic testing, including the appropriate family members to test, the process of testing, insurance coverage and turn-around-time for results. We discussed the implications of a negative, positive and/or variant of uncertain significant result. We recommended Ms. Uballe pursue genetic testing for the 32-gene Comprehensive Cancer Panel with MSH2 Exons 1-7 Inversion Analysis through [facility].  The Comprehensive Cancer Panel offered by [facility] includes sequencing and/or deletion duplication testing of the following 32 genes: APC, ATM, AXIN2, BARD1, BMPR1A, BRCA1, BRCA2, BRIP1, CDH1, CDK4, CDKN2A, CHEK2, EPCAM, FANCC, MLH1, MSH2, MSH6, MUTYH, NBN, PALB2, PMS2, POLD1, POLE, PTEN, RAD51C, RAD51D, SCG5/GREM1, SMAD4, STK11, TP53, VHL, and XRCC2.    Based on Ms. Ziehm's personal and family history of cancer, she meets  medical criteria for genetic testing. Despite that she meets criteria, she may still have an out of pocket cost. We discussed that if her out of pocket cost for testing is over $100, the laboratory will call and confirm whether she wants to proceed with testing.  If the out of pocket cost of testing is less than $100 she will be billed by the genetic testing laboratory.   Based on the patient's personal and family history, a statistical model (IBIS/Tyrer-Cuzick)  and literature data were used to estimate her risk of developing breast cancer. These estimate her lifetime risk of developing breast cancer to be approximately 19.1%. This estimation does not take into account any genetic testing results.  The patient's lifetime breast cancer risk is a preliminary estimate based on available information using one of several models endorsed by the [place] (ACS). The ACS recommends consideration of breast MRI screening as an adjunct to mammography for patients at high risk (defined as 20% or greater lifetime risk). A more detailed breast cancer risk assessment can be considered, if clinically indicated.   PLAN: After considering the risks, benefits, and limitations, Ms. Villegas  provided informed consent to pursue genetic testing and the blood sample was sent to [facility] for analysis of the 32-gene Comprehensive Cancer Panel with MSH2 Exons 1-7 Inversion Analysis. Results should be available within approximately 2-3 weeks' time, at which point they will be disclosed by telephone to Ms. Debenedetto, as will any additional recommendations warranted by these results. Ms. Devins will receive a summary of her genetic counseling visit and a copy of her results once available. This information will also be available in Epic. We encouraged Ms. Gafford to remain in contact with cancer genetics annually so that we can continuously update the family history and inform her of any changes in cancer genetics  and testing that may be of benefit for  her family. Ms. Ramires questions were answered to her satisfaction today. Our contact information was provided should additional questions or concerns arise.  Thank you for the referral and allowing [place] to share in the care of your patient.   Jeanine Luz, MS, [facility] Certified Genetic Counselor [email or web address] Phone: [phone]  The patient was seen for a total of 60 minutes in face-to-face genetic counseling.  This patient was discussed with Drs. Magrinat, Lindi Adie and/or Burr Medico who agrees with the above.    _______________________________________________________________________ For Office Staff:  Number of people involved in session: 1 Was an Intern/ student involved with case: no

## 2016-02-22 NOTE — Assessment & Plan Note (Signed)
Patient given bilateral injections again. Patient will return in one week for third in a series of 3 injections.

## 2016-02-22 NOTE — Patient Instructions (Signed)
Verbal instructions given

## 2016-02-22 NOTE — Progress Notes (Signed)
[name] [address] [place], [place] [address] Phone: [phone] Subjective:    I'm seeing this patient by the request  of:  Cathlean Cower, MD   CC: right knee pain f/u  [id number] NYX KEADY is a 65 y.o. female coming in with complaint of right knee pain.patient did have a fall previously and did have a contusion of the knee. Found to have significant arthritic changes. Patient started on viscous supplementation. Here for second in a series of 3 injections. Overall is feeling relatively well. States that 50% improvement. States that it feels that her knees move more easily.     Past Medical History  Diagnosis Date  . ALLERGIC RHINITIS   . COLONIC POLYPS, HX OF   . Cough   . DEPRESSION   . GERD   . GLUCOSE INTOLERANCE   . HYPERTENSION   . IBS   . OSTEOPENIA   . PERIPHERAL EDEMA   . Pneumonia, organism unspecified   . TOBACCO USE DISORDER/SMOKER-SMOKING CESSATION DISCUSSED   . Impaired glucose tolerance   . Arthritis   . Osteopenia   . Bronchitis   . Allergy   . ANXIETY     no medations needed  . Cataract     no surgery  . COPD     pt unsure of this dx   Past Surgical History  Procedure Laterality Date  . Cesarean section      x2  . Cholecystectomy    . Birthmark removed      from back as a child  . Bladder mesh      2012  . Wisdom tooth extraction    . Colonoscopy     Social History   Social History  . Marital Status: Widowed    Spouse Name: N/A  . Number of Children: N/A  . Years of Education: N/A   Occupational History  . 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 Smoking status: Former Smoker -- 0.50 packs/day for .5 years  . Smokeless tobacco: Never Used     Comment: stopped 12-11-15  . Alcohol Use: 7.0 oz/week    14 Standard drinks or equivalent per week     Comment: couple drinks every night  . Drug Use: No  . Sexual Activity: Not Asked   Other  Topics Concern  . None   Social History Narrative   Allergies  Allergen Reactions  . Ace Inhibitors Cough  . Penicillins Rash   Family History  Problem Relation Age of Onset  . Breast cancer Sister     x 2  . Anxiety disorder    . Hypothyroidism    . Hyperlipidemia    . Diabetes    . Colon cancer Father   . Colon cancer Paternal Grandfather   . Esophageal cancer Neg Hx   . Stomach cancer Neg Hx   . Rectal cancer Neg Hx     Past medical history, social, surgical and family history all reviewed in electronic medical record.  No pertanent information unless stated regarding to the chief complaint.   Review of Systems: No headache, visual changes, nausea, vomiting, diarrhea, constipation, dizziness, abdominal pain, skin rash, fevers, chills, night sweats, weight loss, swollen lymph nodes, body aches, joint swelling, muscle aches, chest pain, shortness of breath, mood changes.   Objective Blood pressure 126/80, pulse 74, weight 189 lb (85.73 kg).  General: No apparent distress alert and oriented  x3 mood and affect normal, dressed appropriately.  HEENT: Pupils equal, extraocular movements intact  Respiratory: Patient's speak in full sentences and does not appear short of breath  Cardiovascular: No lower extremity edema, non tender, no erythema  Skin: Warm dry intact with no signs of infection or rash on extremities or on axial skeleton.  Abdomen: Soft nontender  Neuro: Cranial nerves II through XII are intact, neurovascularly intact in all extremities with 2+ DTRs and 2+ pulses.  Lymph: No lymphadenopathy of posterior or anterior cervical chain or axillae bilaterally.  Gait normal with good balance and coordination.  MSK:  Non tender with full range of motion and good stability and symmetric strength and tone of shoulders, elbows, wrist, hip, and ankles bilaterally. Arthritic changes of most joints Knee:bilateral mild effusion notedbut no bruising Tender over the patellofemoral  joint with mild crepitus notedLess than previous exam ROM full in flexion and extension and lower leg rotation. Ligaments with solid consistent endpoints including ACL, PCL, LCL, MCL. Negative Mcmurray's, Apley's, and Thessalonian tests. painful patellar compression. Patellar and quadriceps tendons unremarkable. Hamstring and quadriceps strength is normal.  Mild improvement from previous exam  After informed written and verbal consent, patient was seated on exam table. Right knee was prepped with alcohol swab and utilizing anterolateral approach, patient's right knee space was injected with 16 mg/2.5 mL of Synvisc (sodium hyaluronate) in a prefilled syringe was injected easily into the knee through a 22-gauge needle.. Patient tolerated the procedure well without immediate complications.  After informed written and verbal consent, patient was seated on exam table. Left knee was prepped with alcohol swab and utilizing anterolateral approach, patient's left knee space was injected with16 mg/2.5 mL of Synvisc (sodium hyaluronate) in a prefilled syringe was injected easily into the knee through a 22-gauge needle.. Patient tolerated the procedure well without immediate complications.     Impression and Recommendations:     This case required medical decision making of moderate complexity.      Note: This dictation was prepared with Dragon dictation along with smaller phrase technology. Any transcriptional errors that result from this process are unintentional.

## 2016-02-27 DIAGNOSIS — M1712 Unilateral primary osteoarthritis, left knee: Secondary | ICD-10-CM | POA: Insufficient documentation

## 2016-02-27 NOTE — Assessment & Plan Note (Signed)
PF and medial.  As above same plan and same tx.

## 2016-02-28 ENCOUNTER — Ambulatory Visit (INDEPENDENT_AMBULATORY_CARE_PROVIDER_SITE_OTHER): Payer: Commercial Managed Care - HMO | Admitting: Family Medicine

## 2016-02-28 ENCOUNTER — Encounter: Payer: Self-pay | Admitting: Family Medicine

## 2016-02-28 VITALS — BP 126/78 | HR 74 | Wt 191.0 lb

## 2016-02-28 DIAGNOSIS — M170 Bilateral primary osteoarthritis of knee: Secondary | ICD-10-CM | POA: Diagnosis not present

## 2016-02-28 DIAGNOSIS — M13169 Monoarthritis, not elsewhere classified, unspecified knee: Secondary | ICD-10-CM

## 2016-02-28 NOTE — Progress Notes (Signed)
[name] [place], [place] [address] Phone: [phone] Subjective:    I'm seeing this patient by the request  of:  Cathlean Cower, MD   CC: right knee pain f/u  [id number] Crystal [name] is a 65 y.o. female coming in with complaint of right knee pain.patient did have a fall previously and did have a contusion of the knee. Found to have significant arthritic changesOf the knees bilaterally. Patient started on viscous supplementation. Here for Third in a series of 3 injections. Patient at the last injection was feeling approximately 50% better. Patient states today that now she is feeling     Past Medical History  Diagnosis Date  . ALLERGIC RHINITIS   . COLONIC POLYPS, HX OF   . Cough   . DEPRESSION   . GERD   . GLUCOSE INTOLERANCE   . HYPERTENSION   . IBS   . OSTEOPENIA   . PERIPHERAL EDEMA   . Pneumonia, organism unspecified   . TOBACCO USE DISORDER/SMOKER-SMOKING CESSATION DISCUSSED   . Impaired glucose tolerance   . Arthritis   . Osteopenia   . Bronchitis   . Allergy   . ANXIETY     no medations needed  . Cataract     no surgery  . COPD     pt unsure of this dx   Past Surgical History  Procedure Laterality Date  . Cesarean section      x2  . Cholecystectomy    . Birthmark removed      from back as a child  . Bladder mesh      2012  . Wisdom tooth extraction    . Colonoscopy     Social History   Social History  . Marital Status: Widowed    Spouse Name: N/A  . Number of Children: N/A  . Years of Education: N/A   Occupational History  . 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 Smoking status: Former Smoker -- 1.50 packs/day for 35 years    Types: Cigarettes    Quit date: 12/13/2015  . Smokeless tobacco: Never Used     Comment: stopped 12-13-15  . Alcohol Use: 7.0 oz/week    14 Standard drinks or equivalent per week     Comment: couple drinks  every night  . Drug Use: No  . Sexual Activity: Not on file   Other Topics Concern  . Not on file   Social History Narrative   Allergies  Allergen Reactions  . Ace Inhibitors Cough  . Penicillins Rash   Family History  Problem Relation Age of Onset  . Breast cancer Sister 40    +lump and tamoxifen; eventually had bilateral mastectomies  . Anxiety disorder    . Hypothyroidism    . Hyperlipidemia    . Diabetes    . Colon cancer Father     dx. >86  . Bladder Cancer Father     dx. 49-79; not a smoker  . Skin cancer Father     basal cell carcinoma  . Colon cancer Paternal Grandfather     unspecified age  . Melanoma Paternal Grandfather     unspecified age  . Esophageal cancer Neg Hx   . Stomach cancer Neg Hx   . Rectal cancer Neg Hx   . Skin cancer Mother     NOS; dx. 74s  . Other Mother     hx  of hysterectomy in her early 8s for fibroid cysts  . Heart attack Maternal Uncle 67  . Heart attack Maternal Grandmother 62  . Thyroid cancer Paternal Grandmother 88  . Breast cancer Sister     dx. early 29s; s/p BL mastectomies  . Cervical cancer Sister     dx. late 21s  . Skin cancer Brother     NOS    Past medical history, social, surgical and family history all reviewed in electronic medical record.  No pertanent information unless stated regarding to the chief complaint.   Review of Systems: No headache, visual changes, nausea, vomiting, diarrhea, constipation, dizziness, abdominal pain, skin rash, fevers, chills, night sweats, weight loss, swollen lymph nodes, body aches, joint swelling, muscle aches, chest pain, shortness of breath, mood changes.   Objective There were no vitals taken for this visit.  General: No apparent distress alert and oriented x3 mood and affect normal, dressed appropriately.  HEENT: Pupils equal, extraocular movements intact  Respiratory: Patient's speak in full sentences and does not appear short of breath  Cardiovascular: No lower  extremity edema, non tender, no erythema  Skin: Warm dry intact with no signs of infection or rash on extremities or on axial skeleton.  Abdomen: Soft nontender  Neuro: Cranial nerves II through XII are intact, neurovascularly intact in all extremities with 2+ DTRs and 2+ pulses.  Lymph: No lymphadenopathy of posterior or anterior cervical chain or axillae bilaterally.  Gait normal with good balance and coordination.  MSK:  Non tender with full range of motion and good stability and symmetric strength and tone of shoulders, elbows, wrist, hip, and ankles bilaterally. Arthritic changes of most joints Knee:bilateral mild effusion notedbut no bruising Tender over the patellofemoral joint with mild crepitus notedLess than previous exam ROM full in flexion and extension and lower leg rotation. Ligaments with solid consistent endpoints including ACL, PCL, LCL, MCL. Negative Mcmurray's, Apley's, and Thessalonian tests. painful patellar compression. Patellar and quadriceps tendons unremarkable. Hamstring and quadriceps strength is normal.  Integument mild improvement of the knees bilaterally  After informed written and verbal consent, patient was seated on exam table. Right knee was prepped with alcohol swab and utilizing anterolateral approach, patient's right knee space was injected with 16 mg/2.5 mL of Synvisc (sodium hyaluronate) in a prefilled syringe was injected easily into the knee through a 22-gauge needle.. Patient tolerated the procedure well without immediate complications.  After informed written and verbal consent, patient was seated on exam table. Left knee was prepped with alcohol swab and utilizing anterolateral approach, patient's left knee space was injected with16 mg/2.5 mL of Synvisc (sodium hyaluronate) in a prefilled syringe was injected easily into the knee through a 22-gauge needle.. Patient tolerated the procedure well without immediate complications.     Impression and  Recommendations:     This case required medical decision making of moderate complexity.      Note: This dictation was prepared with Dragon dictation along with smaller phrase technology. Any transcriptional errors that result from this process are unintentional.

## 2016-02-28 NOTE — Assessment & Plan Note (Signed)
Patient is finish the Synvisc series today. Patient can follow-up again in 4 weeks. Continue conservative therapy otherwise.

## 2016-02-28 NOTE — Patient Instructions (Signed)
You have made it! 3rd and final injection today  If this helps we can repeat it in 6 months but hopefully it will last a lot longer.  Ice still when you need it.  As long as you do well have a great summer and see me when you need me.

## 2016-03-14 ENCOUNTER — Telehealth: Payer: Self-pay | Admitting: Genetic Counselor

## 2016-03-14 NOTE — Telephone Encounter (Signed)
Discussed with Crystal [name] that her genetic test results were negative for pathogenic mutations within any of 32 genes on the Comprehensive Cancer Panel (w/ MSH2 exons 1-7 inversion analysis) through [facility], that would increase her genetic risks for colon polyps, colon cancer, breast cancer, and other related cancers.  Additionally, no uncertain changes were found.  Discussed that we cannot totally rule out a genetic cause, since we cannot be sure that are testing is the best that it can be right now, but also reviewed that most cancers/most colon polyps are not due to a genetic cause.  She should continue to follow her doctors recommendations for colonoscopy screening and other cancer screening.  Encouraged Crystal [name] to call and update the family history with [place] in the future and to check back about updated testing options as well, if interested.  Discussed that other family members could be tested, as there may be a genetic mutation in the family that she just did not inherit.  Her sisters have both had breast cancer, so they are likely eligible for genetic testing, especially Crystal [name] who was diagnosed at age 23.  Discussed that they can use the [email or web address] website to locate a Dietitian in their area.  Discussed that Crystal [name]'s daughter and nieces should likely be getting mammograms now, due to the early-onset breast cancer in the family.  Crystal [name] is happy to receive this news.  She would like a copy of this result emailed to her, and I am happy to do that.  She is welcome to call with any additional questions.

## 2016-03-18 ENCOUNTER — Ambulatory Visit: Payer: Self-pay | Admitting: Genetic Counselor

## 2016-03-18 DIAGNOSIS — Z800 Family history of malignant neoplasm of digestive organs: Secondary | ICD-10-CM

## 2016-03-18 DIAGNOSIS — D126 Benign neoplasm of colon, unspecified: Secondary | ICD-10-CM

## 2016-03-18 DIAGNOSIS — Z803 Family history of malignant neoplasm of breast: Secondary | ICD-10-CM

## 2016-03-18 DIAGNOSIS — Z860100 Personal history of colon polyps, unspecified: Secondary | ICD-10-CM

## 2016-03-18 DIAGNOSIS — Z1379 Encounter for other screening for genetic and chromosomal anomalies: Secondary | ICD-10-CM

## 2016-03-18 DIAGNOSIS — Z809 Family history of malignant neoplasm, unspecified: Secondary | ICD-10-CM

## 2016-03-18 NOTE — Progress Notes (Signed)
GENETIC TEST RESULT  HPI: 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 Please refer to our prior cancer genetics clinic note from Feb 21, 2016 for more information regarding [name]'s medical, social and family histories, and our assessment and recommendations, at the time. [name] recent genetic test results were disclosed to her, as were recommendations warranted by these results. These results and recommendations are discussed in more detail below.  GENETIC TEST RESULTS: At the time of [name]'s visit opn 02/21/16, we recommended she pursue genetic testing of the 32-gene Comprehensive Cancer Panel with MSH2 Exons 1-7 inversion analysis through [facility].  The Comprehensive Cancer Panel offered by [facility] includes sequencing and/or deletion duplication testing of the following 32 genes: APC, ATM, AXIN2, BARD1, BMPR1A, BRCA1, BRCA2, BRIP1, CDH1, CDK4, CDKN2A, CHEK2, EPCAM, FANCC, MLH1, MSH2, MSH6, MUTYH, NBN, PALB2, PMS2, POLD1, POLE, PTEN, RAD51C, RAD51D, SCG5/GREM1, SMAD4, STK11, TP53, VHL, and XRCC2.  Those results are now back, the report date for which is Mar 12, 2016.  Genetic testing was normal, and did not reveal a deleterious mutation in these genes.  Additionally, no variants of uncertain significance (VUSes) were found.  The test report will be scanned into EPIC and will be located under the Results Review tab in the Pathology>Molecular Pathology section.   We discussed with [name] that since the current genetic testing is not perfect, it is possible there may be a gene mutation in one of these genes that current testing cannot detect, but that chance is small. We also discussed, that it is possible that another gene that has not yet been discovered, or that  we have not yet tested, is responsible for the cancer diagnoses in the family, and it is, therefore, important to remain in touch with cancer genetics in the future so that we can continue to offer [name] the most up to date genetic testing.   CANCER SCREENING RECOMMENDATIONS: We still do not have an explanation for the personal history of colon adenomas or for the family history of cancer.  However, this normal result may be reassuring and indicate that [name] does not likely have an increased risk of cancer due to a mutation in one of these genes.  Additionally, many of the colon cancers were diagnosed at later ages in life.  We, therefore, recommended  [name] continue to follow the cancer screening guidelines provided by her primary healthcare providers.  Further testing of relatives who have had cancer would be helpful to further elucidation of [name]'s breast cancer risks.  RECOMMENDATIONS FOR FAMILY MEMBERS: Women in this family might be at some increased risk of developing cancer, over the general population risk, simply due to the family history of cancer. We recommended women in this family have a yearly mammogram beginning at age 56, or 27 years younger than the earliest onset of cancer, an an annual clinical breast exam, and perform monthly breast self-exams. Women in this family should also have a gynecological exam as recommended by their primary provider. All family members should have a colonoscopy by age 22.  Based on [name]'s family history, we recommended her sisters, who were diagnosed with breast cancer at ages 63 and early 40s, have genetic counseling and testing. [name] will let us know if we can be of any  assistance in coordinating genetic counseling and/or testing for these family members.   FOLLOW-UP: Lastly, we discussed with [name] that cancer genetics is a rapidly advancing field and it is possible that new genetic tests will be appropriate for  her and/or her family members in the future. We encouraged her to remain in contact with cancer genetics on an annual basis so we can update her personal and family histories and let her know of advances in cancer genetics that may benefit this family.   Our contact number was provided. [name] questions were answered to her satisfaction, and she knows she is welcome to call us at anytime with additional questions or concerns.   Jeanine Luz, MS, [employer] Certified Genetic Counselor [email or web address] Phone: [phone]

## 2016-03-19 ENCOUNTER — Ambulatory Visit (INDEPENDENT_AMBULATORY_CARE_PROVIDER_SITE_OTHER): Payer: Commercial Managed Care - HMO | Admitting: Internal Medicine

## 2016-03-19 ENCOUNTER — Encounter: Payer: Self-pay | Admitting: Internal Medicine

## 2016-03-19 ENCOUNTER — Other Ambulatory Visit (INDEPENDENT_AMBULATORY_CARE_PROVIDER_SITE_OTHER): Payer: Commercial Managed Care - HMO

## 2016-03-19 VITALS — BP 136/84 | HR 101 | Temp 98.2°F | Resp 20 | Wt 191.0 lb

## 2016-03-19 DIAGNOSIS — J309 Allergic rhinitis, unspecified: Secondary | ICD-10-CM

## 2016-03-19 DIAGNOSIS — R7302 Impaired glucose tolerance (oral): Secondary | ICD-10-CM | POA: Diagnosis not present

## 2016-03-19 DIAGNOSIS — F329 Major depressive disorder, single episode, unspecified: Secondary | ICD-10-CM

## 2016-03-19 DIAGNOSIS — J449 Chronic obstructive pulmonary disease, unspecified: Secondary | ICD-10-CM

## 2016-03-19 DIAGNOSIS — R6889 Other general symptoms and signs: Secondary | ICD-10-CM

## 2016-03-19 DIAGNOSIS — I10 Essential (primary) hypertension: Secondary | ICD-10-CM

## 2016-03-19 DIAGNOSIS — Z0001 Encounter for general adult medical examination with abnormal findings: Secondary | ICD-10-CM | POA: Diagnosis not present

## 2016-03-19 DIAGNOSIS — F32A Depression, unspecified: Secondary | ICD-10-CM

## 2016-03-19 LAB — HEMOGLOBIN A1C: HEMOGLOBIN A1C: 5.2 % (ref 4.6–6.5)

## 2016-03-19 LAB — URINALYSIS, ROUTINE W REFLEX MICROSCOPIC
BILIRUBIN URINE: NEGATIVE
Hgb urine dipstick: NEGATIVE
Ketones, ur: NEGATIVE
Leukocytes, UA: NEGATIVE
NITRITE: NEGATIVE
PH: 6 (ref 5.0–8.0)
RBC / HPF: NONE SEEN (ref 0–?)
SPECIFIC GRAVITY, URINE: 1.02 (ref 1.000–1.030)
Urine Glucose: NEGATIVE
Urobilinogen, UA: 0.2 (ref 0.0–1.0)

## 2016-03-19 LAB — CBC WITH DIFFERENTIAL/PLATELET
BASOS ABS: 0 10*3/uL (ref 0.0–0.1)
Basophils Relative: 0.7 % (ref 0.0–3.0)
EOS PCT: 1.5 % (ref 0.0–5.0)
Eosinophils Absolute: 0.1 10*3/uL (ref 0.0–0.7)
HCT: 44.2 % (ref 36.0–46.0)
HEMOGLOBIN: 15.2 g/dL — AB (ref 12.0–15.0)
LYMPHS ABS: 2.4 10*3/uL (ref 0.7–4.0)
Lymphocytes Relative: 33.6 % (ref 12.0–46.0)
MCHC: 34.3 g/dL (ref 30.0–36.0)
MCV: 98.1 fl (ref 78.0–100.0)
MONO ABS: 0.9 10*3/uL (ref 0.1–1.0)
MONOS PCT: 12.7 % — AB (ref 3.0–12.0)
Neutro Abs: 3.7 10*3/uL (ref 1.4–7.7)
Neutrophils Relative %: 51.5 % (ref 43.0–77.0)
Platelets: 304 10*3/uL (ref 150.0–400.0)
RBC: 4.51 Mil/uL (ref 3.87–5.11)
RDW: 14.2 % (ref 11.5–15.5)
WBC: 7.2 10*3/uL (ref 4.0–10.5)

## 2016-03-19 LAB — BASIC METABOLIC PANEL
BUN: 18 mg/dL (ref 6–23)
CALCIUM: 9.9 mg/dL (ref 8.4–10.5)
CO2: 29 meq/L (ref 19–32)
Chloride: 100 mEq/L (ref 96–112)
Creatinine, Ser: 0.73 mg/dL (ref 0.40–1.20)
GFR: 84.8 mL/min (ref >60.00)
GLUCOSE: 98 mg/dL (ref 70–99)
Potassium: 4.7 mEq/L (ref 3.5–5.1)
Sodium: 136 mEq/L (ref 135–145)

## 2016-03-19 LAB — LIPID PANEL
CHOLESTEROL: 254 mg/dL — AB (ref 0–200)
HDL: 129.6 mg/dL (ref >39.00)
LDL CALC: 113 mg/dL — AB (ref 0–99)
NonHDL: 124.09
TRIGLYCERIDES: 53 mg/dL (ref 0.0–149.0)
Total CHOL/HDL Ratio: 2
VLDL: 10.6 mg/dL (ref 0.0–40.0)

## 2016-03-19 LAB — HEPATIC FUNCTION PANEL
ALBUMIN: 4.5 g/dL (ref 3.5–5.2)
ALT: 79 U/L — ABNORMAL HIGH (ref 0–35)
AST: 68 U/L — ABNORMAL HIGH (ref 0–37)
Alkaline Phosphatase: 79 U/L (ref 39–117)
Bilirubin, Direct: 0.1 mg/dL (ref 0.0–0.3)
TOTAL PROTEIN: 7.4 g/dL (ref 6.0–8.3)
Total Bilirubin: 0.7 mg/dL (ref 0.2–1.2)

## 2016-03-19 LAB — TSH: TSH: 0.96 u[IU]/mL (ref 0.35–4.50)

## 2016-03-19 MED ORDER — CITALOPRAM HYDROBROMIDE 10 MG PO TABS: 10.0000 mg | ORAL_TABLET | Freq: Every day | ORAL | Status: DC

## 2016-03-19 MED ORDER — ALPRAZOLAM 0.5 MG PO TABS: 0.5000 mg | ORAL_TABLET | Freq: Every evening | ORAL | Status: DC | PRN

## 2016-03-19 NOTE — Progress Notes (Signed)
Pre visit review using our clinic review tool, if applicable. No additional management support is needed unless otherwise documented below in the visit note. 

## 2016-03-19 NOTE — Assessment & Plan Note (Addendum)
Mild worsening, no SI or HIl, for add celexa 10 qd,  to f/u any worsening symptoms or concerns, declines referral counseling  In addition to the time spent performing CPE, I spent an additional 25 minutes face to face,in which greater than 50% of this time was spent in counseling and coordination of care for patient's acute illness as documented.

## 2016-03-19 NOTE — Assessment & Plan Note (Signed)

## 2016-03-19 NOTE — Patient Instructions (Addendum)
You had the Prevnar 13 pneumonia shot today  Please take all new medication as prescribed - the citalopram 10 mg per day  You can also take Mucinex (or it's generic off brand) for congestion, and tylenol as needed for pain.   Please continue all other medications as before, and refills have been done if requested - the xanax  Please have the pharmacy call with any other refills you may need.  Please continue your efforts at being more active, low cholesterol diet, and weight control.  You are otherwise up to date with prevention measures today.  Please keep your appointments with your specialists as you may have planned  Please go to the LAB in the Basement (turn left off the elevator) for the tests to be done today  You will be contacted by phone if any changes need to be made immediately.  Otherwise, you will receive a letter about your results with an explanation, but please check with MyChart first.  Please remember to sign up for MyChart if you have not done so, as this will be important to you in the future with finding out test results, communicating by private email, and scheduling acute appointments online when needed.  Please return in 1 year for your yearly visit, or sooner if needed, with Lab testing done 3-5 days before

## 2016-03-19 NOTE — Progress Notes (Signed)
Subjective:    Patient ID: Crystal [name], female    DOB: 1951/08/12, 65 y.o.   MRN: [id number]  HPI  Here for wellness and f/u;  Overall doing ok;  Pt denies Chest pain, worsening SOB, DOE, wheezing, orthopnea, PND, worsening LE edema, palpitations, dizziness or syncope.  Pt denies neurological change such as new headache, facial or extremity weakness.  Pt denies polydipsia, polyuria, or low sugar symptoms. Pt states overall good compliance with treatment and medications, good tolerability, and has been trying to follow appropriate diet.  Pt is primary caretaker of 66yo monther, husbnad is manic depressive and she is stuck/trapped in the house taking care of them, excapes by watching lots of movies, but has no suicidal ideation or panic. No fever, night sweats, wt loss, loss of appetite, or other constitutional symptoms.  Pt states good ability with ADL's, has low fall risk, home safety reviewed and adequate, no other significant changes in hearing or vision, and only occasionally active with exercise.   Also c/o worsening depression, mild more nervous recently, has less energy and feeling blah since quit smoking, gained 8 lbs since last visit Throat seems dry all the time as well, with some congestion she cant move.  Marland Kitchen  Has much less cough overall, thinks has some post nasal gtt - Does have several wks ongoing nasal allergy symptoms with clearish congestion, itch and sneezing, without fever, pain, ST, cough, swelling or wheezing.  Pt denies polydipsia, polyuria, or low sugar symptoms such as weakness or confusion improved with po intake.  Pt states overall good compliance with meds, trying to follow lower cholesterol diet. Pt denies chest pain, increased sob or doe, wheezing, orthopnea, PND, increased LE swelling, palpitations, dizziness or syncope. Wt Readings from Last 3 Encounters:  03/19/16 191 lb (86.637 kg)  02/28/16 191 lb (86.637 kg)  02/22/16 189 lb (85.73 kg)   Past Medical History    Diagnosis Date  . ALLERGIC RHINITIS   . COLONIC POLYPS, HX OF   . Cough   . DEPRESSION   . GERD   . GLUCOSE INTOLERANCE   . HYPERTENSION   . IBS   . OSTEOPENIA   . PERIPHERAL EDEMA   . Pneumonia, organism unspecified   . TOBACCO USE DISORDER/SMOKER-SMOKING CESSATION DISCUSSED   . Impaired glucose tolerance   . Arthritis   . Osteopenia   . Bronchitis   . Allergy   . ANXIETY     no medations needed  . Cataract     no surgery  . COPD     pt unsure of this dx   Past Surgical History  Procedure Laterality Date  . Cesarean section      x2  . Cholecystectomy    . Birthmark removed      from back as a child  . Bladder mesh      2012  . Wisdom tooth extraction    . Colonoscopy      reports that she quit smoking about 3 months ago. Her smoking use included Cigarettes. She has a 52.5 pack-year smoking history. She has never used smokeless tobacco. She reports that she drinks about 7.0 oz of alcohol per week. She reports that she does not use illicit drugs. family history includes Bladder Cancer in her father; Breast cancer in her sister; Breast cancer (age of onset: 30) in her sister; Cervical cancer in her sister; Colon cancer in her father and paternal grandfather; Heart attack (age of onset: 54) in her maternal  grandmother; Heart attack (age of onset: 44) in her maternal uncle; Melanoma in her paternal grandfather; Other in her mother; Skin cancer in her brother, father, and mother; Thyroid cancer (age of onset: 75) in her paternal grandmother. There is no history of Esophageal cancer, Stomach cancer, or Rectal cancer. Allergies  Allergen Reactions  . Ace Inhibitors Cough  . Penicillins Rash   Current Outpatient Prescriptions on File Prior to Visit  Medication Sig Dispense Refill  . aspirin 325 MG tablet Take 325 mg by mouth every 4 (four) hours as needed for mild pain.    . Cholecalciferol (VITAMIN D) 1000 UNITS capsule Take 1,000 Units by mouth daily.      .  cyclobenzaprine (FLEXERIL) 5 MG tablet Take 1 tablet (5 mg total) by mouth 3 (three) times daily. 30 tablet 11  . Fexofenadine HCl (MUCINEX ALLERGY PO) Take 600 mg by mouth as needed.    . Flaxseed, Linseed, (FLAXSEED OIL PO) Take 1,400 mg by mouth daily.    . folic acid (FOLVITE) 1 MG tablet Take 1 mg by mouth daily.      . Glucosamine-Chondroit-Vit C-Mn (GLUCOSAMINE 1500 COMPLEX PO) Take by mouth daily.    Marland Kitchen loratadine (CLARITIN) 10 MG tablet Take 10 mg by mouth daily as needed.     Marland Kitchen losartan-hydrochlorothiazide (HYZAAR) 50-12.5 MG tablet Take 1 tablet by mouth daily. 90 tablet 3  . Turmeric 500 MG CAPS Take by mouth daily.     No current facility-administered medications on file prior to visit.   Review of Systems Constitutional: Negative for increased diaphoresis, or other activity, appetite or siginficant weight change other than noted HENT: Negative for worsening hearing loss, ear pain, facial swelling, mouth sores and neck stiffness.   Eyes: Negative for other worsening pain, redness or visual disturbance.  Respiratory: Negative for choking or stridor Cardiovascular: Negative for other chest pain and palpitations.  Gastrointestinal: Negative for worsening diarrhea, blood in stool, or abdominal distention Genitourinary: Negative for hematuria, flank pain or change in urine volume.  Musculoskeletal: Negative for myalgias or other joint complaints.  Skin: Negative for other color change and wound or drainage.  Neurological: Negative for syncope and numbness. other than noted Hematological: Negative for adenopathy. or other swelling Psychiatric/Behavioral: Negative for hallucinations, SI, self-injury, decreased concentration or other worsening agitation.      Objective:   Physical Exam BP 136/84 mmHg  Pulse 101  Temp(Src) 98.2 F (36.8 C) (Oral)  Resp 20  Wt 191 lb (86.637 kg)  SpO2 98% VS noted, not ill appaering Constitutional: Pt is oriented to person, place, and time.  Appears well-developed and well-nourished, in no significant distress Head: Normocephalic and atraumatic  Eyes: Conjunctivae and EOM are normal. Pupils are equal, round, and reactive to light Right Ear: External ear normal.  Left Ear: External ear normal Nose: Nose normal.  Mouth/Throat: Oropharynx is clear and moist  Bilat tm's with mild erythema.  Max sinus areas non tender.  Pharynx with mild erythema, no exudate Neck: Normal range of motion. Neck supple. No JVD present. No tracheal deviation present or significant neck LA or mass Cardiovascular: Normal rate, regular rhythm, normal heart sounds and intact distal pulses.   Pulmonary/Chest: Effort normal and breath sounds decresaed without rales or wheezing  Abdominal: Soft. Bowel sounds are normal. NT. No HSM  Musculoskeletal: Normal range of motion. Exhibits no edema Lymphadenopathy: Has no cervical adenopathy.  Neurological: Pt is alert and oriented to person, place, and time. Pt has normal reflexes. No cranial  nerve deficit. Motor grossly intact Skin: Skin is warm and dry. No rash noted or new ulcers Psychiatric:  Has nervous, mild depressed mood and affect. Behavior is normal.     Assessment & Plan:

## 2016-03-19 NOTE — Assessment & Plan Note (Signed)
For re-start flonase, claritin prn,  to f/u any worsening symptoms or concerns

## 2016-03-19 NOTE — Assessment & Plan Note (Signed)
stable overall by history and exam, recent data reviewed with pt, and pt to continue medical treatment as before,  to f/u any worsening symptoms or concerns BP Readings from Last 3 Encounters:  03/19/16 136/84  02/28/16 126/78  02/22/16 126/80

## 2016-03-19 NOTE — Assessment & Plan Note (Signed)
stable overall by history and exam, recent data reviewed with pt, and pt to continue medical treatment as before,  to f/u any worsening symptoms or concerns Lab Results  Component Value Date   HGBA1C 5.2 03/19/2016

## 2016-03-19 NOTE — Assessment & Plan Note (Signed)
With mild symptoms of cough and congestion chronic, for mucinex otc bid prn,  to f/u any worsening symptoms or concerns

## 2016-07-15 ENCOUNTER — Encounter: Payer: Self-pay | Admitting: Internal Medicine

## 2016-07-15 DIAGNOSIS — N63 Unspecified lump in breast: Secondary | ICD-10-CM | POA: Diagnosis not present

## 2016-09-29 ENCOUNTER — Other Ambulatory Visit: Payer: Self-pay | Admitting: Internal Medicine

## 2016-09-30 NOTE — Telephone Encounter (Signed)
Done hardcopy to Corinne  

## 2016-09-30 NOTE — Telephone Encounter (Signed)
faxed

## 2016-10-20 HISTORY — PX: COLONOSCOPY: SHX174

## 2016-11-24 ENCOUNTER — Encounter: Payer: Self-pay | Admitting: Gastroenterology

## 2016-12-05 ENCOUNTER — Telehealth: Payer: Self-pay | Admitting: Internal Medicine

## 2016-12-05 DIAGNOSIS — H11822 Conjunctivochalasis, left eye: Secondary | ICD-10-CM | POA: Diagnosis not present

## 2016-12-05 DIAGNOSIS — H31091 Other chorioretinal scars, right eye: Secondary | ICD-10-CM | POA: Diagnosis not present

## 2016-12-05 DIAGNOSIS — H52223 Regular astigmatism, bilateral: Secondary | ICD-10-CM | POA: Diagnosis not present

## 2016-12-05 DIAGNOSIS — H02401 Unspecified ptosis of right eyelid: Secondary | ICD-10-CM | POA: Diagnosis not present

## 2016-12-05 DIAGNOSIS — H2513 Age-related nuclear cataract, bilateral: Secondary | ICD-10-CM | POA: Diagnosis not present

## 2016-12-05 DIAGNOSIS — H5203 Hypermetropia, bilateral: Secondary | ICD-10-CM | POA: Diagnosis not present

## 2016-12-05 DIAGNOSIS — H524 Presbyopia: Secondary | ICD-10-CM | POA: Diagnosis not present

## 2016-12-05 NOTE — Telephone Encounter (Signed)
Made an appointment

## 2016-12-12 ENCOUNTER — Encounter: Payer: Self-pay | Admitting: Internal Medicine

## 2016-12-12 ENCOUNTER — Ambulatory Visit (INDEPENDENT_AMBULATORY_CARE_PROVIDER_SITE_OTHER): Payer: Medicare HMO | Admitting: Internal Medicine

## 2016-12-12 VITALS — BP 116/72 | HR 96 | Temp 97.6°F | Ht 63.0 in | Wt 195.0 lb

## 2016-12-12 DIAGNOSIS — N644 Mastodynia: Secondary | ICD-10-CM | POA: Insufficient documentation

## 2016-12-12 DIAGNOSIS — J449 Chronic obstructive pulmonary disease, unspecified: Secondary | ICD-10-CM

## 2016-12-12 DIAGNOSIS — D126 Benign neoplasm of colon, unspecified: Secondary | ICD-10-CM | POA: Diagnosis not present

## 2016-12-12 DIAGNOSIS — R1310 Dysphagia, unspecified: Secondary | ICD-10-CM | POA: Diagnosis not present

## 2016-12-12 NOTE — Assessment & Plan Note (Signed)
stable overall by history and exam, recent data reviewed with pt, and pt to continue medical treatment as before,  to f/u any worsening symptoms or concerns @LASTSAO2(3)@  

## 2016-12-12 NOTE — Assessment & Plan Note (Signed)
Cant r/o malignancy though hopefully recurrence of prior issue improved with dilation/egd years ago, for Gi referral, likely needs EGD with colonoscopy

## 2016-12-12 NOTE — Assessment & Plan Note (Signed)
Due for f/u colonoscopy - will refer GI as liekly needs EGD as well

## 2016-12-12 NOTE — Patient Instructions (Addendum)
Please continue all other medications as before, and refills have been done if requested.  Please have the pharmacy call with any other refills you may need.  Please keep your appointments with your specialists as you may have planned  You will be contacted regarding the referral for: mammogram, and Dr Armbruster/GI  Please return in 3 months, or sooner if needed, with Lab testing done 3-5 days before

## 2016-12-12 NOTE — Assessment & Plan Note (Signed)
With known right breast abnormality, for f/u diag mammo with tomo,  to f/u any worsening symptoms or concerns

## 2016-12-12 NOTE — Progress Notes (Signed)
Subjective:    Patient ID: Crystal [name], female    DOB: 11-02-1950, 66 y.o.   MRN: [id number]  HPI  Here to f/u, is due soon for colonoscopy f/u, but also with c/o 9 months worsening dysphagia to most solids, sometimes even chokes on liquids.  No vomiting, aspiration, lower appetite or wt loss Wt Readings from Last 3 Encounters:  12/12/16 195 lb (88.5 kg)  03/19/16 191 lb (86.6 kg)  02/28/16 191 lb (86.6 kg)  Mother under hospice care, with increased stress.  Also mentions due for f/u Diag mammogram with tomo for known right breast  Cystic mass with discomfort.  Symptoms persist, needs order. Pt denies chest pain, increased sob or doe, wheezing, orthopnea, PND, increased LE swelling, palpitations, dizziness or syncope. Denies worsening depressive symptoms, suicidal ideation, or panic Past Medical History:  Diagnosis Date  . ALLERGIC RHINITIS   . Allergy   . ANXIETY    no medations needed  . Arthritis   . Bronchitis   . Cataract    no surgery  . COLONIC POLYPS, HX OF   . COPD    pt unsure of this dx  . Cough   . DEPRESSION   . GERD   . GLUCOSE INTOLERANCE   . HYPERTENSION   . IBS   . Impaired glucose tolerance   . OSTEOPENIA   . Osteopenia   . PERIPHERAL EDEMA   . Pneumonia, organism unspecified(486)   . TOBACCO USE DISORDER/SMOKER-SMOKING CESSATION DISCUSSED    Past Surgical History:  Procedure Laterality Date  . birthmark removed     from back as a child  . bladder mesh     2012  . CESAREAN SECTION     x2  . CHOLECYSTECTOMY    . COLONOSCOPY    . WISDOM TOOTH EXTRACTION      reports that she quit smoking about a year ago. Her smoking use included Cigarettes. She has a 52.50 pack-year smoking history. She has never used smokeless tobacco. She reports that she drinks about 7.0 oz of alcohol per week . She reports that she does not use drugs. family history includes Bladder Cancer in her father; Breast cancer in her sister; Breast cancer (age of onset: 93) in her  sister; Cervical cancer in her sister; Colon cancer in her father and paternal grandfather; Heart attack (age of onset: 60) in her maternal grandmother; Heart attack (age of onset: 42) in her maternal uncle; Melanoma in her paternal grandfather; Other in her mother; Skin cancer in her brother, father, and mother; Thyroid cancer (age of onset: 67) in her paternal grandmother. Allergies  Allergen Reactions  . Ace Inhibitors Cough  . Penicillins Rash   Current Outpatient Prescriptions on File Prior to Visit  Medication Sig Dispense Refill  . ALPRAZolam (XANAX) 0.5 MG tablet TAKE 1 TO 2 TABLETS BY MOUTH AT BEDTIME AS NEEDED 60 tablet 5  . aspirin 325 MG tablet Take 325 mg by mouth every 4 (four) hours as needed for mild pain.    . Cholecalciferol (VITAMIN D) 1000 UNITS capsule Take 1,000 Units by mouth daily.      . citalopram (CELEXA) 10 MG tablet Take 1 tablet (10 mg total) by mouth daily. 90 tablet 3  . Fexofenadine HCl (MUCINEX ALLERGY PO) Take 600 mg by mouth as needed.    . Flaxseed, Linseed, (FLAXSEED OIL PO) Take 1,400 mg by mouth daily.    . folic acid (FOLVITE) 1 MG tablet Take 1 mg by mouth  daily.      . Glucosamine-Chondroit-Vit C-Mn (GLUCOSAMINE 1500 COMPLEX PO) Take by mouth daily.    Marland Kitchen loratadine (CLARITIN) 10 MG tablet Take 10 mg by mouth daily as needed.     Marland Kitchen losartan-hydrochlorothiazide (HYZAAR) 50-12.5 MG tablet Take 1 tablet by mouth daily. 90 tablet 3  . Turmeric 500 MG CAPS Take by mouth daily.     No current facility-administered medications on file prior to visit.    Review of Systems  Constitutional: Negative for unusual diaphoresis or night sweats HENT: Negative for ear swelling or discharge Eyes: Negative for worsening visual haziness  Respiratory: Negative for choking and stridor.   Gastrointestinal: Negative for distension or worsening eructation Genitourinary: Negative for retention or change in urine volume.  Musculoskeletal: Negative for other MSK pain or  swelling Skin: Negative for color change and worsening wound Neurological: Negative for tremors and numbness other than noted  Psychiatric/Behavioral: Negative for decreased concentration or agitation other than above   All other system neg per pt    Objective:   Physical Exam BP 116/72   Pulse 96   Temp 97.6 F (36.4 C)   Ht 5' 3"  (1.6 m)   Wt 195 lb (88.5 kg)   SpO2 98%   BMI 34.54 kg/m  VS noted,  Constitutional: Pt appears in no apparent distress HENT: Head: NCAT.  Right Ear: External ear normal.  Left Ear: External ear normal.  Eyes: . Pupils are equal, round, and reactive to light. Conjunctivae and EOM are normal Neck: Normal range of motion. Neck supple.  Cardiovascular: Normal rate and regular rhythm.   Pulmonary/Chest: Effort normal and breath sounds without rales or wheezing.  Abd:  Soft, NT, ND, + BS Neurological: Pt is alert. Not confused , motor grossly intact Skin: Skin is warm. No rash, no LE edema Psychiatric: Pt behavior is normal. No agitation. not depressed affect No other new exam findings    Assessment & Plan:

## 2016-12-18 ENCOUNTER — Other Ambulatory Visit: Payer: Self-pay | Admitting: Internal Medicine

## 2017-01-15 ENCOUNTER — Other Ambulatory Visit (INDEPENDENT_AMBULATORY_CARE_PROVIDER_SITE_OTHER): Payer: Medicare HMO

## 2017-01-15 ENCOUNTER — Ambulatory Visit (INDEPENDENT_AMBULATORY_CARE_PROVIDER_SITE_OTHER): Payer: Medicare HMO | Admitting: Gastroenterology

## 2017-01-15 ENCOUNTER — Encounter: Payer: Self-pay | Admitting: Gastroenterology

## 2017-01-15 VITALS — BP 116/76 | HR 104 | Ht 63.0 in | Wt 193.1 lb

## 2017-01-15 DIAGNOSIS — R945 Abnormal results of liver function studies: Secondary | ICD-10-CM

## 2017-01-15 DIAGNOSIS — D126 Benign neoplasm of colon, unspecified: Secondary | ICD-10-CM

## 2017-01-15 DIAGNOSIS — R7989 Other specified abnormal findings of blood chemistry: Secondary | ICD-10-CM | POA: Diagnosis not present

## 2017-01-15 DIAGNOSIS — R740 Nonspecific elevation of levels of transaminase and lactic acid dehydrogenase [LDH]: Secondary | ICD-10-CM

## 2017-01-15 DIAGNOSIS — Z86010 Personal history of colonic polyps: Secondary | ICD-10-CM | POA: Diagnosis not present

## 2017-01-15 DIAGNOSIS — K219 Gastro-esophageal reflux disease without esophagitis: Secondary | ICD-10-CM

## 2017-01-15 DIAGNOSIS — R1310 Dysphagia, unspecified: Secondary | ICD-10-CM | POA: Diagnosis not present

## 2017-01-15 DIAGNOSIS — R7401 Elevation of levels of liver transaminase levels: Secondary | ICD-10-CM

## 2017-01-15 LAB — HEPATITIS B SURFACE ANTIGEN: HEP B S AG: NEGATIVE

## 2017-01-15 LAB — HEPATITIS B SURFACE ANTIBODY,QUALITATIVE: HEP B S AB: NEGATIVE

## 2017-01-15 LAB — IBC PANEL
Iron: 118 ug/dL (ref 42–145)
Saturation Ratios: 27.2 % (ref 20.0–50.0)
Transferrin: 310 mg/dL (ref 212.0–360.0)

## 2017-01-15 LAB — FERRITIN: FERRITIN: 154.7 ng/mL (ref 10.0–291.0)

## 2017-01-15 LAB — HEPATITIS A ANTIBODY, TOTAL: HEP A TOTAL AB: BORDERLINE — AB

## 2017-01-15 MED ORDER — RANITIDINE HCL 150 MG PO TABS: 150.0000 mg | ORAL_TABLET | Freq: Two times a day (BID) | ORAL | 3 refills | Status: DC

## 2017-01-15 MED ORDER — NA SULFATE-K SULFATE-MG SULF 17.5-3.13-1.6 GM/177ML PO SOLN: 1.0000 | Freq: Once | ORAL | 0 refills | Status: AC

## 2017-01-15 NOTE — Patient Instructions (Signed)
If you are age 67 or older, your body mass index should be between 23-30. Your Body mass index is 34.21 kg/m. If this is out of the aforementioned range listed, please consider follow up with your Primary Care Provider.  If you are age 78 or younger, your body mass index should be between 19-25. Your Body mass index is 34.21 kg/m. If this is out of the aformentioned range listed, please consider follow up with your Primary Care Provider.   We have sent the following medications to your pharmacy for you to pick up at your convenience:  Congress physician has requested that you go to the basement for lab work before leaving today.  You have been scheduled for an abdominal ultrasound at [facility] (1st floor of hospital) on Wednesday, April 4th at 11:00am. Please arrive 15 minutes prior to your appointment for registration. Make certain not to have anything to eat or drink after midnight the night prior to your appointment. Should you need to reschedule your appointment, please contact radiology at [phone]. This test typically takes about 30 minutes to perform.  You have been scheduled for an endoscopy and colonoscopy. Please follow the written instructions given to you at your visit today. Please pick up your prep supplies at the pharmacy within the next 1-3 days. If you use inhalers (even only as needed), please bring them with you on the day of your procedure. Your physician has requested that you go to www.startemmi.com and enter the access code given to you at your visit today. This web site gives a general overview about your procedure. However, you should still follow specific instructions given to you by our office regarding your preparation for the procedure.  Thank you.

## 2017-01-15 NOTE — Progress Notes (Signed)
HPI :  66 y/o female with past medical history as outlined below, here for follow-up visit for dysphagia and colon polyps.  She denies any bowel problems currently. No blood in the stools. No abdominal pains. She reports having some swallowing difficulty recently. She has a history of heartburn. She reports dysphagia sometimes to liquids, some solids takes a long time to go down and feels sensitive when she eats. She has some occasional heartburn which is relieved with Rolaids. Not taking anything else for heartburn. No FH of esophageal cancer. No prior upper endoscopy.   Father had colon cancer and bladder cancer, colon cancer diagnosed in his late 73s.   She drinks around 1/2 gallon of vodka per week, previously higher volumes. In review of labs she's had a persistently elevated ALT for several years. She denies a family history of liver disease.  Colonoscopy 01/01/16 - 12 polyps removed, around 10 adenomas. Multiple adenomas on removed colonoscopies. Recommended genetic counseling evaluation. She was seen by genetic counselor and testing was normal.   Past Medical History:  Diagnosis Date  . ALLERGIC RHINITIS   . Allergy   . ANXIETY    no medations needed  . Arthritis   . Bronchitis   . Cataract    no surgery  . COLONIC POLYPS, HX OF   . COPD    pt unsure of this dx  . Cough   . DEPRESSION   . GERD   . GLUCOSE INTOLERANCE   . HYPERTENSION   . IBS   . Impaired glucose tolerance   . OSTEOPENIA   . Osteopenia   . PERIPHERAL EDEMA   . Pneumonia, organism unspecified(486)   . TOBACCO USE DISORDER/SMOKER-SMOKING CESSATION DISCUSSED      Past Surgical History:  Procedure Laterality Date  . birthmark removed     from back as a child  . bladder mesh     2012  . CESAREAN SECTION     x2  . CHOLECYSTECTOMY    . COLONOSCOPY    . WISDOM TOOTH EXTRACTION     Family History  Problem Relation Age of Onset  . Breast cancer Sister 40    +lump and tamoxifen; eventually had  bilateral mastectomies  . Colon cancer Father     dx. >74  . Bladder Cancer Father     dx. 61-79; not a smoker  . Skin cancer Father     basal cell carcinoma  . Colon cancer Paternal Grandfather     unspecified age  . Melanoma Paternal Grandfather     unspecified age  . Skin cancer Mother     NOS; dx. 82s  . Other Mother     hx of hysterectomy in her early 68s for fibroid cysts  . Heart attack Maternal Uncle 67  . Heart attack Maternal Grandmother 62  . Thyroid cancer Paternal Grandmother 88  . Breast cancer Sister     dx. early 56s; s/p BL mastectomies  . Cervical cancer Sister     dx. late 3s  . Skin cancer Brother     NOS  . Anxiety disorder    . Hypothyroidism    . Hyperlipidemia    . Diabetes    . Esophageal cancer Neg Hx   . Stomach cancer Neg Hx   . Rectal cancer Neg Hx    Social History  Substance Use Topics  . Smoking status: Former Smoker    Packs/day: 1.50    Years: 35.00    Types: Cigarettes  Quit date: 12/13/2015  . Smokeless tobacco: Never Used     Comment: stopped 12-13-15  . Alcohol use 7.0 oz/week    14 Standard drinks or equivalent per week     Comment: couple drinks every night   Current Outpatient Prescriptions  Medication Sig Dispense Refill  . ALPRAZolam (XANAX) 0.5 MG tablet TAKE 1 TO 2 TABLETS BY MOUTH AT BEDTIME AS NEEDED 60 tablet 5  . aspirin 325 MG tablet Take 325 mg by mouth as needed for mild pain.     . Cholecalciferol (VITAMIN D) 1000 UNITS capsule Take 1,000 Units by mouth daily.      . citalopram (CELEXA) 10 MG tablet Take 1 tablet (10 mg total) by mouth daily. 90 tablet 3  . Fexofenadine HCl (MUCINEX ALLERGY PO) Take 600 mg by mouth as needed.    . folic acid (FOLVITE) 1 MG tablet Take 1 mg by mouth daily.      Marland Kitchen loratadine (CLARITIN) 10 MG tablet Take 10 mg by mouth daily as needed.     Marland Kitchen losartan-hydrochlorothiazide (HYZAAR) 50-12.5 MG tablet Take 1 tablet by mouth daily. Yearly physical due in May must see MD for refills 90  tablet 0  . Turmeric 500 MG CAPS Take by mouth daily.     No current facility-administered medications for this visit.    Allergies  Allergen Reactions  . Ace Inhibitors Cough  . Penicillins Rash     Review of Systems: All systems reviewed and negative except where noted in HPI.   Lab Results  Component Value Date   WBC 7.2 03/19/2016   HGB 15.2 (H) 03/19/2016   HCT 44.2 03/19/2016   MCV 98.1 03/19/2016   PLT 304.0 03/19/2016    Lab Results  Component Value Date   ALT 79 (H) 03/19/2016   AST 68 (H) 03/19/2016   ALKPHOS 79 03/19/2016   BILITOT 0.7 03/19/2016    Lab Results  Component Value Date   CREATININE 0.73 03/19/2016   BUN 18 03/19/2016   NA 136 03/19/2016   K 4.7 03/19/2016   CL 100 03/19/2016   CO2 29 03/19/2016     Physical Exam: BP 116/76   Pulse (!) 104   Ht 5' 3"  (1.6 m)   Wt 193 lb 2 oz (87.6 kg)   BMI 34.21 kg/m  Constitutional: Pleasant,well-developed, female in no acute distress. HEENT: Normocephalic and atraumatic. Conjunctivae are normal. No scleral icterus. Neck supple.  Cardiovascular: Normal rate, regular rhythm.  Pulmonary/chest: Effort normal and breath sounds normal. No wheezing, rales or rhonchi. Abdominal: Soft, nondistended, nontender. . There are no masses palpable.  Extremities: no edema Lymphadenopathy: No cervical adenopathy noted. Neurological: Alert and oriented to person place and time. Skin: Skin is warm and dry. No rashes noted. Psychiatric: Normal mood and affect. Behavior is normal.   ASSESSMENT AND PLAN: 66 year old female here for reassessment of the following issues as outlined:  GERD / dysphagia - recommend an upper endoscopy to further evaluate her dysphagia and potentially treat with dilation pending findings. In the interim I discussed options to treat reflux for her to include H2 blockers and PPIs. Following this discussion she wished start Zantac 150 mg twice daily. I otherwise discussed risks and benefits  of endoscopy and anesthesia with her and she wished proceed.  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 Genetic testing negative for hereditary syndromes. Given the volume of adenomas on her last  colonoscopy she is due for repeat exam at this time, and she wished to proceed.   Elevated ALT - long-standing persistent elevation, she has significant alcohol use and suspect this could be related. I counseled her to minimize and abstain from alcohol use and possible. Otherwise recommend workup for other chronic liver diseases to include lab testing and ultrasound of the liver. I will relay the results of these tests once available.  [place] Cellar, MD [facility] Pager [phone]

## 2017-01-16 LAB — ANA W/REFLEX: ANA: NEGATIVE

## 2017-01-16 LAB — IGG: IGG (IMMUNOGLOBIN G), SERUM: 951 mg/dL (ref 694–1618)

## 2017-01-16 LAB — ANTI-SMOOTH MUSCLE ANTIBODY, IGG

## 2017-01-21 ENCOUNTER — Other Ambulatory Visit: Payer: Self-pay

## 2017-01-21 ENCOUNTER — Ambulatory Visit (HOSPITAL_COMMUNITY)
Admission: RE | Admit: 2017-01-21 | Discharge: 2017-01-21 | Disposition: A | Discharge status: Home / Self Care | Payer: Medicare HMO | Source: Ambulatory Visit | Attending: Gastroenterology | Admitting: Gastroenterology

## 2017-01-21 DIAGNOSIS — K760 Fatty (change of) liver, not elsewhere classified: Secondary | ICD-10-CM | POA: Insufficient documentation

## 2017-01-21 DIAGNOSIS — R7401 Elevation of levels of liver transaminase levels: Secondary | ICD-10-CM

## 2017-01-21 DIAGNOSIS — D126 Benign neoplasm of colon, unspecified: Secondary | ICD-10-CM | POA: Diagnosis not present

## 2017-01-21 DIAGNOSIS — Z9049 Acquired absence of other specified parts of digestive tract: Secondary | ICD-10-CM | POA: Insufficient documentation

## 2017-01-21 DIAGNOSIS — K219 Gastro-esophageal reflux disease without esophagitis: Secondary | ICD-10-CM | POA: Diagnosis not present

## 2017-01-21 DIAGNOSIS — R1310 Dysphagia, unspecified: Secondary | ICD-10-CM | POA: Diagnosis not present

## 2017-01-21 DIAGNOSIS — R740 Nonspecific elevation of levels of transaminase and lactic acid dehydrogenase [LDH]: Secondary | ICD-10-CM | POA: Diagnosis not present

## 2017-01-21 DIAGNOSIS — R945 Abnormal results of liver function studies: Principal | ICD-10-CM

## 2017-01-21 DIAGNOSIS — R7989 Other specified abnormal findings of blood chemistry: Secondary | ICD-10-CM

## 2017-01-23 ENCOUNTER — Ambulatory Visit (INDEPENDENT_AMBULATORY_CARE_PROVIDER_SITE_OTHER): Payer: Medicare HMO | Admitting: Gastroenterology

## 2017-01-23 DIAGNOSIS — Z23 Encounter for immunization: Secondary | ICD-10-CM | POA: Diagnosis not present

## 2017-01-23 DIAGNOSIS — K760 Fatty (change of) liver, not elsewhere classified: Secondary | ICD-10-CM

## 2017-02-23 ENCOUNTER — Ambulatory Visit (INDEPENDENT_AMBULATORY_CARE_PROVIDER_SITE_OTHER): Payer: Medicare HMO | Admitting: Gastroenterology

## 2017-02-23 DIAGNOSIS — Z23 Encounter for immunization: Secondary | ICD-10-CM

## 2017-03-05 ENCOUNTER — Encounter: Payer: Self-pay | Admitting: Gastroenterology

## 2017-03-09 ENCOUNTER — Other Ambulatory Visit (INDEPENDENT_AMBULATORY_CARE_PROVIDER_SITE_OTHER): Payer: Medicare HMO

## 2017-03-09 DIAGNOSIS — Z0001 Encounter for general adult medical examination with abnormal findings: Secondary | ICD-10-CM | POA: Diagnosis not present

## 2017-03-09 DIAGNOSIS — R7302 Impaired glucose tolerance (oral): Secondary | ICD-10-CM | POA: Diagnosis not present

## 2017-03-09 LAB — HEPATIC FUNCTION PANEL
ALK PHOS: 82 U/L (ref 39–117)
ALT: 19 U/L (ref 0–35)
AST: 19 U/L (ref 0–37)
Albumin: 4 g/dL (ref 3.5–5.2)
BILIRUBIN DIRECT: 0.1 mg/dL (ref 0.0–0.3)
Total Bilirubin: 0.3 mg/dL (ref 0.2–1.2)
Total Protein: 6.9 g/dL (ref 6.0–8.3)

## 2017-03-09 LAB — CBC WITH DIFFERENTIAL/PLATELET
BASOS ABS: 0.1 10*3/uL (ref 0.0–0.1)
Basophils Relative: 0.9 % (ref 0.0–3.0)
Eosinophils Absolute: 0.2 10*3/uL (ref 0.0–0.7)
Eosinophils Relative: 2.5 % (ref 0.0–5.0)
HCT: 43.9 % (ref 36.0–46.0)
Hemoglobin: 15.2 g/dL — ABNORMAL HIGH (ref 12.0–15.0)
LYMPHS ABS: 2.9 10*3/uL (ref 0.7–4.0)
Lymphocytes Relative: 37.6 % (ref 12.0–46.0)
MCHC: 34.7 g/dL (ref 30.0–36.0)
MCV: 99.1 fl (ref 78.0–100.0)
MONO ABS: 0.8 10*3/uL (ref 0.1–1.0)
MONOS PCT: 10.9 % (ref 3.0–12.0)
NEUTROS ABS: 3.7 10*3/uL (ref 1.4–7.7)
NEUTROS PCT: 48.1 % (ref 43.0–77.0)
Platelets: 302 10*3/uL (ref 150.0–400.0)
RBC: 4.43 Mil/uL (ref 3.87–5.11)
RDW: 13.1 % (ref 11.5–15.5)
WBC: 7.6 10*3/uL (ref 4.0–10.5)

## 2017-03-09 LAB — LIPID PANEL
CHOL/HDL RATIO: 3
Cholesterol: 198 mg/dL (ref 0–200)
HDL: 75.3 mg/dL (ref >39.00)
LDL Cholesterol: 112 mg/dL — ABNORMAL HIGH (ref 0–99)
NonHDL: 122.21
Triglycerides: 51 mg/dL (ref 0.0–149.0)
VLDL: 10.2 mg/dL (ref 0.0–40.0)

## 2017-03-09 LAB — BASIC METABOLIC PANEL
BUN: 20 mg/dL (ref 6–23)
CALCIUM: 9.8 mg/dL (ref 8.4–10.5)
CHLORIDE: 104 meq/L (ref 96–112)
CO2: 30 meq/L (ref 19–32)
CREATININE: 0.7 mg/dL (ref 0.40–1.20)
GFR: 88.74 mL/min (ref >60.00)
GLUCOSE: 98 mg/dL (ref 70–99)
Potassium: 4.8 mEq/L (ref 3.5–5.1)
Sodium: 140 mEq/L (ref 135–145)

## 2017-03-09 LAB — URINALYSIS, ROUTINE W REFLEX MICROSCOPIC
Bilirubin Urine: NEGATIVE
Hgb urine dipstick: NEGATIVE
Ketones, ur: NEGATIVE
Nitrite: NEGATIVE
PH: 6.5 (ref 5.0–8.0)
RBC / HPF: NONE SEEN (ref 0–?)
SPECIFIC GRAVITY, URINE: 1.01 (ref 1.000–1.030)
Total Protein, Urine: NEGATIVE
UROBILINOGEN UA: 0.2 (ref 0.0–1.0)
Urine Glucose: NEGATIVE

## 2017-03-09 LAB — HEMOGLOBIN A1C: HEMOGLOBIN A1C: 5.4 % (ref 4.6–6.5)

## 2017-03-09 LAB — TSH: TSH: 1.28 u[IU]/mL (ref 0.35–4.50)

## 2017-03-11 ENCOUNTER — Ambulatory Visit (INDEPENDENT_AMBULATORY_CARE_PROVIDER_SITE_OTHER): Payer: Medicare HMO | Admitting: Internal Medicine

## 2017-03-11 VITALS — BP 128/82 | HR 99 | Ht 63.0 in | Wt 193.0 lb

## 2017-03-11 DIAGNOSIS — E2839 Other primary ovarian failure: Secondary | ICD-10-CM

## 2017-03-11 DIAGNOSIS — Z23 Encounter for immunization: Secondary | ICD-10-CM | POA: Diagnosis not present

## 2017-03-11 DIAGNOSIS — R7302 Impaired glucose tolerance (oral): Secondary | ICD-10-CM | POA: Diagnosis not present

## 2017-03-11 DIAGNOSIS — Z0000 Encounter for general adult medical examination without abnormal findings: Secondary | ICD-10-CM

## 2017-03-11 MED ORDER — LOSARTAN POTASSIUM-HCTZ 50-12.5 MG PO TABS: 1.0000 | ORAL_TABLET | Freq: Every day | ORAL | 3 refills | Status: DC

## 2017-03-11 MED ORDER — CITALOPRAM HYDROBROMIDE 10 MG PO TABS: 10.0000 mg | ORAL_TABLET | Freq: Every day | ORAL | 3 refills | Status: DC

## 2017-03-11 MED ORDER — ALPRAZOLAM 0.5 MG PO TABS: 0.5000 mg | ORAL_TABLET | Freq: Every evening | ORAL | 5 refills | Status: DC | PRN

## 2017-03-11 NOTE — Addendum Note (Signed)
Addended by: Juliet Rude on: 03/11/2017 02:03 PM   Modules accepted: Orders

## 2017-03-11 NOTE — Assessment & Plan Note (Signed)
stable overall by history and exam, recent data reviewed with pt, and pt to continue medical treatment as before,  to f/u any worsening symptoms or concerns Lab Results  Component Value Date   HGBA1C 5.4 03/09/2017

## 2017-03-11 NOTE — Assessment & Plan Note (Signed)

## 2017-03-11 NOTE — Patient Instructions (Addendum)
You had the Pneumovax pneumonia shot today  Plesae consider the Shingrix vaccine, and call if you want this sent to your pharmacy  Please continue all other medications as before, and refills have been done if requested.  Please have the pharmacy call with any other refills you may need.  Please continue your efforts at being more active, low cholesterol diet, and weight control.  You are otherwise up to date with prevention measures today.  Please keep your appointments with your specialists as you may have planned  You will be contacted regarding the referral for: Bone Density at [facility]  Please return in 1 year for your yearly visit, or sooner if needed, with Lab testing done 3-5 days before

## 2017-03-11 NOTE — Progress Notes (Signed)
Subjective:    Patient ID: Crystal [name], female    DOB: [birth year], 66 y.o.   MRN: [id number]  HPI  Here for wellness and f/u;  Overall doing ok;  Pt denies Chest pain, worsening SOB, DOE, wheezing, orthopnea, PND, worsening LE edema, palpitations, dizziness or syncope.  Pt denies neurological change such as new headache, facial or extremity weakness.  Pt denies polydipsia, polyuria, or low sugar symptoms. Pt states overall good compliance with treatment and medications, good tolerability, and has been trying to follow appropriate diet.  Pt denies worsening depressive symptoms, suicidal ideation or panic. No fever, night sweats, wt loss, loss of appetite, or other constitutional symptoms.  Pt states good ability with ADL's, has low fall risk, home safety reviewed and adequate, no other significant changes in hearing or vision, occasionally active with exercise, really not at all, but plans to start.. Mother died at 41yo with broken hip complications, afib and DM.  Has coloonscopy and egd sched for may 31 has stopped vodka which was moderate consumption, now only occas wine. Wt Readings from Last 3 Encounters:  03/11/17 193 lb (87.5 kg)  01/15/17 193 lb 2 oz (87.6 kg)  12/12/16 195 lb (88.5 kg)   BP Readings from Last 3 Encounters:  03/11/17 128/82  01/15/17 116/76  12/12/16 116/72   Past Medical History:  Diagnosis Date  . ALLERGIC RHINITIS   . Allergy   . ANXIETY    no medations needed  . Arthritis   . Bronchitis   . Cataract    no surgery  . COLONIC POLYPS, HX OF   . COPD    pt unsure of this dx  . Cough   . DEPRESSION   . GERD   . GLUCOSE INTOLERANCE   . HYPERTENSION   . IBS   . Impaired glucose tolerance   . OSTEOPENIA   . Osteopenia   . PERIPHERAL EDEMA   . Pneumonia, organism unspecified(486)   . TOBACCO USE DISORDER/SMOKER-SMOKING CESSATION DISCUSSED    Past Surgical History:  Procedure Laterality Date  . birthmark removed     from back as a child  . bladder  mesh     2012  . CESAREAN SECTION     x2  . CHOLECYSTECTOMY    . COLONOSCOPY    . WISDOM TOOTH EXTRACTION      reports that she quit smoking about 14 months ago. Her smoking use included Cigarettes. She has a 52.50 pack-year smoking history. She has never used smokeless tobacco. She reports that she drinks about 7.0 oz of alcohol per week . She reports that she does not use drugs. family history includes Bladder Cancer in her father; Breast cancer in her sister; Breast cancer (age of onset: 39) in her sister; Cervical cancer in her sister; Colon cancer in her father and paternal grandfather; Heart attack (age of onset: 51) in her maternal grandmother; Heart attack (age of onset: 93) in her maternal uncle; Melanoma in her paternal grandfather; Other in her mother; Skin cancer in her brother, father, and mother; Thyroid cancer (age of onset: 42) in her paternal grandmother. Allergies  Allergen Reactions  . Ace Inhibitors Cough  . Penicillins Rash   Current Outpatient Prescriptions on File Prior to Visit  Medication Sig Dispense Refill  . ALPRAZolam (XANAX) 0.5 MG tablet TAKE 1 TO 2 TABLETS BY MOUTH AT BEDTIME AS NEEDED 60 tablet 5  . aspirin 325 MG tablet Take 325 mg by mouth as needed for mild pain.     Marland Kitchen  Cholecalciferol (VITAMIN D) 1000 UNITS capsule Take 1,000 Units by mouth daily.      . citalopram (CELEXA) 10 MG tablet Take 1 tablet (10 mg total) by mouth daily. 90 tablet 3  . Fexofenadine HCl (MUCINEX ALLERGY PO) Take 600 mg by mouth as needed.    . folic acid (FOLVITE) 1 MG tablet Take 1 mg by mouth daily.      Marland Kitchen loratadine (CLARITIN) 10 MG tablet Take 10 mg by mouth daily as needed.     Marland Kitchen losartan-hydrochlorothiazide (HYZAAR) 50-12.5 MG tablet Take 1 tablet by mouth daily. Yearly physical due in May must see MD for refills 90 tablet 0  . ranitidine (ZANTAC) 150 MG tablet Take 1 tablet (150 mg total) by mouth 2 (two) times daily. 60 tablet 3  . Turmeric 500 MG CAPS Take by mouth daily.      No current facility-administered medications on file prior to visit.    Review of Systems Constitutional: Negative for other unusual diaphoresis, sweats, appetite or weight changes HENT: Negative for other worsening hearing loss, ear pain, facial swelling, mouth sores or neck stiffness.   Eyes: Negative for other worsening pain, redness or other visual disturbance.  Respiratory: Negative for other stridor or swelling Cardiovascular: Negative for other palpitations or other chest pain  Gastrointestinal: Negative for worsening diarrhea or loose stools, blood in stool, distention or other pain Genitourinary: Negative for hematuria, flank pain or other change in urine volume.  Musculoskeletal: Negative for myalgias or other joint swelling.  Skin: Negative for other color change, or other wound or worsening drainage.  Neurological: Negative for other syncope or numbness. Hematological: Negative for other adenopathy or swelling Psychiatric/Behavioral: Negative for hallucinations, other worsening agitation, SI, self-injury, or new decreased concentration All other system neg per pt    Objective:   Physical Exam BP 128/82   Pulse 99   Ht 5' 3"  (1.6 m)   Wt 193 lb (87.5 kg)   SpO2 100%   BMI 34.19 kg/m  VS noted, obese  Constitutional: Pt is oriented to person, place, and time. Appears well-developed and well-nourished, in no significant distress and comfortable Head: Normocephalic and atraumatic  Eyes: Conjunctivae and EOM are normal. Pupils are equal, round, and reactive to light Right Ear: External ear normal without discharge Left Ear: External ear normal without discharge Nose: Nose without discharge or deformity Mouth/Throat: Oropharynx is without other ulcerations and moist  Neck: Normal range of motion. Neck supple. No JVD present. No tracheal deviation present or significant neck LA or mass Cardiovascular: Normal rate, regular rhythm, normal heart sounds and intact distal  pulses.   Pulmonary/Chest: WOB normal and breath sounds without rales or wheezing  Abdominal: Soft. Bowel sounds are normal. NT. No HSM  Musculoskeletal: Normal range of motion. Exhibits no edema Lymphadenopathy: Has no other cervical adenopathy.  Neurological: Pt is alert and oriented to person, place, and time. Pt has normal reflexes. No cranial nerve deficit. Motor grossly intact, Gait intact Skin: Skin is warm and dry. No rash noted or new ulcerations Psychiatric:  Has mild nervous mood and affect. Behavior is normal without agitation No other exam findings  Lab Results  Component Value Date   WBC 7.6 03/09/2017   HGB 15.2 (H) 03/09/2017   HCT 43.9 03/09/2017   PLT 302.0 03/09/2017   GLUCOSE 98 03/09/2017   CHOL 198 03/09/2017   TRIG 51.0 03/09/2017   HDL 75.30 03/09/2017   LDLDIRECT 82.2 07/01/2013   LDLCALC 112 (H) 03/09/2017  ALT 19 03/09/2017   AST 19 03/09/2017   NA 140 03/09/2017   K 4.8 03/09/2017   CL 104 03/09/2017   CREATININE 0.70 03/09/2017   BUN 20 03/09/2017   CO2 30 03/09/2017   TSH 1.28 03/09/2017   INR 0.94 04/24/2011   HGBA1C 5.4 03/09/2017       Assessment & Plan:

## 2017-03-13 ENCOUNTER — Telehealth: Payer: Self-pay | Admitting: Internal Medicine

## 2017-03-13 MED ORDER — DOXYCYCLINE HYCLATE 100 MG PO TABS: 100.0000 mg | ORAL_TABLET | Freq: Two times a day (BID) | ORAL | 0 refills | Status: DC

## 2017-03-13 NOTE — Telephone Encounter (Signed)
Unfortunately, this may represent the start of infection at the injection site  OK for doxy course - I will sent erx

## 2017-03-13 NOTE — Telephone Encounter (Signed)
Spoke with pt she states that it is not at the injection site but 3-4in. Below the site. She states that it is possibly a allergic reaction. I told pt that I would follow up with PCP since I've gather this additional information and get back with her once I receive a response.

## 2017-03-13 NOTE — Telephone Encounter (Signed)
Pt ws given a Prevnar 13 shot on Wednesday. Her upper right arm now has a red spot and is swollNE and hot to touch and now has another spot broken out on her arm. Would like to know if this is normal. She took benadryl yesterday and it did nto help.

## 2017-03-13 NOTE — Telephone Encounter (Signed)
Pt was informed and expressed understanding. She states that she will see how the afternoon and evening goes and if not any improvement she will come to the Sat Clinic.

## 2017-03-13 NOTE — Telephone Encounter (Signed)
I think I would still take the antibiotic since we are not able to see it; ok to also take benadryl 50 mg everyt 6 hrs to see if this helps   Please consider Sat clinic in AM if pt feels she needs to be seen

## 2017-03-17 ENCOUNTER — Telehealth: Payer: Self-pay | Admitting: Gastroenterology

## 2017-03-17 ENCOUNTER — Other Ambulatory Visit: Payer: Self-pay

## 2017-03-17 NOTE — Telephone Encounter (Signed)
Spoke to patient, she has since Sunday been having vomiting, diarrhea and fever. She states that about 2 weeks ago family was at the beach and the children had this virus, same symptoms, since then it has been affecting the adults. She is just now able to keep a little water down, she just took some imodium and so far that has stay in. Suggested she get some Gatorade to help with dehydration, sip it slowly. Patient is concerned that she is supposed to start her bowel prep tomorrow and will not be able to tolerate it. I told her that I will cancel her endocolon and reschedule it, advised her it will not be until July.

## 2017-03-18 NOTE — Telephone Encounter (Signed)
Sorry to hear this, thanks for rescheduling her.  Sounds like this may be viral gastroenteritis - agree with pushing fluids and immodium. If she is not feeling better or getting worse let me know, would send stool study (GI pathogen panel). Thanks

## 2017-03-19 ENCOUNTER — Encounter: Payer: Medicare HMO | Admitting: Gastroenterology

## 2017-04-16 ENCOUNTER — Encounter: Payer: Self-pay | Admitting: Gastroenterology

## 2017-04-16 ENCOUNTER — Ambulatory Visit (AMBULATORY_SURGERY_CENTER): Payer: Medicare HMO | Admitting: Gastroenterology

## 2017-04-16 VITALS — BP 118/76 | HR 78 | Temp 98.9°F | Resp 11 | Ht 63.0 in | Wt 193.0 lb

## 2017-04-16 DIAGNOSIS — Z86010 Personal history of colonic polyps: Secondary | ICD-10-CM

## 2017-04-16 DIAGNOSIS — R1310 Dysphagia, unspecified: Secondary | ICD-10-CM

## 2017-04-16 DIAGNOSIS — K3189 Other diseases of stomach and duodenum: Secondary | ICD-10-CM | POA: Diagnosis not present

## 2017-04-16 DIAGNOSIS — D125 Benign neoplasm of sigmoid colon: Secondary | ICD-10-CM

## 2017-04-16 DIAGNOSIS — D123 Benign neoplasm of transverse colon: Secondary | ICD-10-CM | POA: Diagnosis not present

## 2017-04-16 DIAGNOSIS — D120 Benign neoplasm of cecum: Secondary | ICD-10-CM | POA: Diagnosis not present

## 2017-04-16 DIAGNOSIS — K319 Disease of stomach and duodenum, unspecified: Secondary | ICD-10-CM | POA: Diagnosis not present

## 2017-04-16 DIAGNOSIS — D122 Benign neoplasm of ascending colon: Secondary | ICD-10-CM

## 2017-04-16 DIAGNOSIS — S36400A Unspecified injury of duodenum, initial encounter: Secondary | ICD-10-CM | POA: Diagnosis not present

## 2017-04-16 DIAGNOSIS — K219 Gastro-esophageal reflux disease without esophagitis: Secondary | ICD-10-CM | POA: Diagnosis not present

## 2017-04-16 DIAGNOSIS — I10 Essential (primary) hypertension: Secondary | ICD-10-CM | POA: Diagnosis not present

## 2017-04-16 DIAGNOSIS — J449 Chronic obstructive pulmonary disease, unspecified: Secondary | ICD-10-CM | POA: Diagnosis not present

## 2017-04-16 MED ORDER — SODIUM CHLORIDE 0.9 % IV SOLN: 500.0000 mL | INTRAVENOUS | Status: DC

## 2017-04-16 NOTE — Progress Notes (Signed)
Called to room to assist during endoscopic procedure.  Patient ID and intended procedure confirmed with present staff. Received instructions for my participation in the procedure from the performing physician.  

## 2017-04-16 NOTE — Progress Notes (Signed)
To recovery, report to [employer], RN, VSS

## 2017-04-16 NOTE — Patient Instructions (Signed)
Discharge instructions given. Handouts on polyps,diverticulosis and a hiatal hernia. Biopsies taken on the endoscopy. Resume previous medications. YOU HAD AN ENDOSCOPIC PROCEDURE TODAY AT [facility]:   Refer to the procedure report that was given to you for any specific questions about what was found during the examination.  If the procedure report does not answer your questions, please call your gastroenterologist to clarify.  If you requested that your care partner not be given the details of your procedure findings, then the procedure report has been included in a sealed envelope for you to review at your convenience later.  YOU SHOULD EXPECT: Some feelings of bloating in the abdomen. Passage of more gas than usual.  Walking can help get rid of the air that was put into your GI tract during the procedure and reduce the bloating. If you had a lower endoscopy (such as a colonoscopy or flexible sigmoidoscopy) you may notice spotting of blood in your stool or on the toilet paper. If you underwent a bowel prep for your procedure, you may not have a normal bowel movement for a few days.  Please Note:  You might notice some irritation and congestion in your nose or some drainage.  This is from the oxygen used during your procedure.  There is no need for concern and it should clear up in a day or so.  SYMPTOMS TO REPORT IMMEDIATELY:   Following lower endoscopy (colonoscopy or flexible sigmoidoscopy):  Excessive amounts of blood in the stool  Significant tenderness or worsening of abdominal pains  Swelling of the abdomen that is new, acute  Fever of 100F or higher   Following upper endoscopy (EGD)  Vomiting of blood or coffee ground material  New chest pain or pain under the shoulder blades  Painful or persistently difficult swallowing  New shortness of breath  Fever of 100F or higher  Black, tarry-looking stools  For urgent or emergent issues, a gastroenterologist can be  reached at any hour by calling [phone].   DIET:  We do recommend a small meal at first, but then you may proceed to your regular diet.  Drink plenty of fluids but you should avoid alcoholic beverages for 24 hours.  ACTIVITY:  You should plan to take it easy for the rest of today and you should NOT DRIVE or use heavy machinery until tomorrow (because of the sedation medicines used during the test).    FOLLOW UP: Our staff will call the number listed on your records the next business day following your procedure to check on you and address any questions or concerns that you may have regarding the information given to you following your procedure. If we do not reach you, we will leave a message.  However, if you are feeling well and you are not experiencing any problems, there is no need to return our call.  We will assume that you have returned to your regular daily activities without incident.  If any biopsies were taken you will be contacted by phone or by letter within the next 1-3 weeks.  Please call us at [phone] if you have not heard about the biopsies in 3 weeks.    SIGNATURES/CONFIDENTIALITY: You and/or your care partner have signed paperwork which will be entered into your electronic medical record.  These signatures attest to the fact that that the information above on your After Visit Summary has been reviewed and is understood.  Full responsibility of the confidentiality of this discharge information  lies with you and/or your care-partner.

## 2017-04-16 NOTE — Op Note (Signed)
[place] Patient Name: Crystal [name] Procedure Date: 04/16/2017 2:17 PM MRN: [id number] Endoscopist: Remo Lipps P. Armbruster MD, MD Age: 66 Referring MD:  Date of Birth: 1951-04-29 Gender: Female Account #: [id number] Procedure:                Upper GI endoscopy Indications:              Dysphagia, GERD - improved significantly on zantac Medicines:                Monitored Anesthesia Care Procedure:                Pre-Anesthesia Assessment:                           - Prior to the procedure, a History and Physical                            was performed, and patient medications and                            allergies were reviewed. The patient's tolerance of                            previous anesthesia was also reviewed. The risks                            and benefits of the procedure and the sedation                            options and risks were discussed with the patient.                            All questions were answered, and informed consent                            was obtained. Prior Anticoagulants: The patient has                            taken aspirin, last dose was 2 days prior to                            procedure. ASA Grade Assessment: II - A patient                            with mild systemic disease. After reviewing the                            risks and benefits, the patient was deemed in                            satisfactory condition to undergo the procedure.                           After obtaining informed consent, the endoscope was  passed under direct vision. Throughout the                            procedure, the patient's blood pressure, pulse, and                            oxygen saturations were monitored continuously. The                            Endoscope was introduced through the mouth, and                            advanced to the second part of duodenum. The upper      GI endoscopy was accomplished without difficulty.                            The patient tolerated the procedure well. Scope In: Scope Out: Findings:                 A medium-sized hiatal hernia was present.                           One moderate benign-appearing, intrinsic stenosis                            was found. This measured less than one cm (in                            length) and was traversed. Not dilated as outlined                            below.                           The exam of the esophagus was otherwise normal.                           The exam of the stomach was otherwise normal,                            although difficult to distend it and have the                            patient retain air due to coughing. Complete                            retroflexed view not obtained.                           Polypoid hypertrophied folds were found in the                            duodenal bulb and in the second portion of the  duodenum . Biopsies were taken with a cold forceps                            for histology to ensure no adenomatous change. Complications:            The patient had significant coughing during the                            procedure and anesthesia for EGD. She developed                            transient oxygen desaturation during the procedure,                            which was managed per anesthesia and resolved.                            further attempts at sedation resulted in recurrent                            transient oxygen desaturation again - at which                            point anesthesia did not think further attempts                            should be made today. The patient had significant                            secretions, coughing during the exam, thought to be                            due to bronchospasm. Complete exam and dilation of                            stricture  not performed in this light Estimated                            blood loss: Minimal. Estimated Blood Loss:     Estimated blood loss was minimal. Impression:               - Medium-sized hiatal hernia.                           - Benign-appearing esophageal stenosis.                           - Mucosal changes in the duodenum. Biopsied.                           As above, limited exam without dilation performed                            due to issues with anesthesia as outlined above,  not all pictures obtained in this light. Recommendation:           - Patient has a contact number available for                            emergencies. The signs and symptoms of potential                            delayed complications were discussed with the                            patient. Return to normal activities tomorrow.                            Written discharge instructions were provided to the                            patient.                           - Resume previous diet.                           - Continue present medications.                           - Await pathology results.                           - Will consider repeat upper endoscopy at the                            hospital for further evaluation and dilation as                            needed, will await pathology results first. Remo Lipps P. Armbruster MD, MD 04/16/2017 3:27:19 PM This report has been signed electronically.

## 2017-04-16 NOTE — Op Note (Signed)
[place] Patient Name: Crystal [name] Procedure Date: 04/16/2017 2:18 PM MRN: [id number] Endoscopist: Remo Lipps P. Armbruster MD, MD Age: 66 Referring MD:  Date of Birth: 1951/03/17 Gender: Female Account #: [id number] Procedure:                Colonoscopy Indications:              Surveillance: History of numerous (> 10) adenomas                            on last colonoscopy (1 year ago) Medicines:                Monitored Anesthesia Care Procedure:                Pre-Anesthesia Assessment:                           - Prior to the procedure, a History and Physical                            was performed, and patient medications and                            allergies were reviewed. The patient's tolerance of                            previous anesthesia was also reviewed. The risks                            and benefits of the procedure and the sedation                            options and risks were discussed with the patient.                            All questions were answered, and informed consent                            was obtained. Prior Anticoagulants: The patient has                            taken aspirin, last dose was 2 days prior to                            procedure. ASA Grade Assessment: II - A patient                            with mild systemic disease. After reviewing the                            risks and benefits, the patient was deemed in                            satisfactory condition to undergo the procedure.  After obtaining informed consent, the colonoscope                            was passed under direct vision. Throughout the                            procedure, the patient's blood pressure, pulse, and                            oxygen saturations were monitored continuously. The                            Colonoscope was introduced through the anus and                            advanced to the the  cecum, identified by                            appendiceal orifice and ileocecal valve. The                            colonoscopy was performed without difficulty. The                            patient tolerated the procedure well. The quality                            of the bowel preparation was adequate. The                            ileocecal valve, appendiceal orifice, and rectum                            were photographed. Scope In: 2:50:56 PM Scope Out: 3:12:21 PM Scope Withdrawal Time: 0 hours 15 minutes 30 seconds  Total Procedure Duration: 0 hours 21 minutes 25 seconds  Findings:                 The perianal and digital rectal examinations were                            normal.                           Many small and large-mouthed diverticula were found                            in the left colon.                           A diminutive polyp was found in the cecum. The                            polyp was sessile. The polyp was removed with a  cold snare. Resection and retrieval were complete.                           A single medium-sized angiodysplastic lesion was                            found in the cecum.                           A 4 mm polyp was found in the ascending colon. The                            polyp was sessile. The polyp was removed with a                            cold snare. Resection and retrieval were complete.                           A 4 mm polyp was found in the transverse colon. The                            polyp was sessile. The polyp was removed with a                            cold snare. Resection and retrieval were complete.                           Two sessile polyps were found in the sigmoid colon.                            The polyps were 3 to 4 mm in size. These polyps                            were removed with a cold snare. Resection and                            retrieval were complete.                            A diffuse area of melanosis was found in the entire                            colon.                           The colon was tortous with significant looping. The                            exam was otherwise without abnormality. Complications:            No immediate complications. Estimated blood loss:                            Minimal. Estimated  Blood Loss:     Estimated blood loss was minimal. Impression:               - Diverticulosis in the left colon.                           - One diminutive polyp in the cecum, removed with a                            cold snare. Resected and retrieved.                           - A single colonic angiodysplastic lesion.                           - One 4 mm polyp in the ascending colon, removed                            with a cold snare. Resected and retrieved.                           - One 4 mm polyp in the transverse colon, removed                            with a cold snare. Resected and retrieved.                           - Two 3 to 4 mm polyps in the sigmoid colon,                            removed with a cold snare. Resected and retrieved.                           - Melanosis in the colon.                           - Tortous colon                           - The examination was otherwise normal. Recommendation:           - Patient has a contact number available for                            emergencies. The signs and symptoms of potential                            delayed complications were discussed with the                            patient. Return to normal activities tomorrow.                            Written discharge instructions were provided to the  patient.                           - Resume previous diet.                           - Continue present medications.                           - Await pathology results.                           - Repeat colonoscopy is  recommended for                            surveillance. The colonoscopy date will be                            determined after pathology results from today's                            exam become available for review.                           - No ibuprofen, naproxen, or other non-steroidal                            anti-inflammatory drugs for 2 weeks after polyp                            removal. Remo Lipps P. Armbruster MD, MD 04/16/2017 3:18:35 PM This report has been signed electronically.

## 2017-04-16 NOTE — Progress Notes (Signed)
Pt coughing, desats to 60s. Scope removed. A/W supported with BVM positive pressure with O2 flush. Suctioned well with copious secretions. Sats improving. Attempt to resedate attemted with sats trending down. Egd aborted. Colonoscopy perfomed with light sedation. O2 via n/c at 6l/m. 90OPA in place

## 2017-04-17 ENCOUNTER — Telehealth: Payer: Self-pay | Admitting: *Deleted

## 2017-04-17 NOTE — Telephone Encounter (Signed)
  Follow up Call-  Call back number 04/16/2017 01/01/2016  Post procedure Call Back phone  # [phone]  Permission to leave phone message Yes Yes  Some recent data might be hidden     Patient questions:  Do you have a fever, pain , or abdominal swelling? No. Pain Score  0 *  Have you tolerated food without any problems? Yes.    Have you been able to return to your normal activities? Yes.    Do you have any questions about your discharge instructions: Diet   No. Medications  No. Follow up visit  No.  Do you have questions or concerns about your Care? No.  Actions: * If pain score is 4 or above: No action needed, pain <4.

## 2017-06-12 ENCOUNTER — Encounter: Payer: Self-pay | Admitting: Gastroenterology

## 2017-06-30 ENCOUNTER — Other Ambulatory Visit: Payer: Self-pay | Admitting: Internal Medicine

## 2017-07-07 ENCOUNTER — Other Ambulatory Visit: Payer: Self-pay

## 2017-07-07 ENCOUNTER — Telehealth: Payer: Self-pay | Admitting: Gastroenterology

## 2017-07-07 DIAGNOSIS — R7989 Other specified abnormal findings of blood chemistry: Secondary | ICD-10-CM

## 2017-07-07 DIAGNOSIS — R945 Abnormal results of liver function studies: Principal | ICD-10-CM

## 2017-07-07 NOTE — Telephone Encounter (Signed)
Patient scheduled for [place] in October, scheduled for 3rd hep B injection and follow up visit in office.

## 2017-07-17 ENCOUNTER — Encounter: Payer: Self-pay | Admitting: Internal Medicine

## 2017-07-17 DIAGNOSIS — Z1231 Encounter for screening mammogram for malignant neoplasm of breast: Secondary | ICD-10-CM | POA: Diagnosis not present

## 2017-07-17 DIAGNOSIS — M810 Age-related osteoporosis without current pathological fracture: Secondary | ICD-10-CM | POA: Diagnosis not present

## 2017-07-17 DIAGNOSIS — Z803 Family history of malignant neoplasm of breast: Secondary | ICD-10-CM | POA: Diagnosis not present

## 2017-07-17 DIAGNOSIS — M8589 Other specified disorders of bone density and structure, multiple sites: Secondary | ICD-10-CM | POA: Diagnosis not present

## 2017-07-17 LAB — HM MAMMOGRAPHY

## 2017-07-20 ENCOUNTER — Ambulatory Visit (HOSPITAL_COMMUNITY)
Admission: RE | Admit: 2017-07-20 | Discharge: 2017-07-20 | Disposition: A | Discharge status: Home / Self Care | Payer: Medicare HMO | Source: Ambulatory Visit | Attending: Gastroenterology | Admitting: Gastroenterology

## 2017-07-20 ENCOUNTER — Other Ambulatory Visit (INDEPENDENT_AMBULATORY_CARE_PROVIDER_SITE_OTHER): Payer: Medicare HMO

## 2017-07-20 DIAGNOSIS — R7989 Other specified abnormal findings of blood chemistry: Secondary | ICD-10-CM

## 2017-07-20 DIAGNOSIS — R945 Abnormal results of liver function studies: Secondary | ICD-10-CM | POA: Insufficient documentation

## 2017-07-20 DIAGNOSIS — K828 Other specified diseases of gallbladder: Secondary | ICD-10-CM | POA: Insufficient documentation

## 2017-07-20 DIAGNOSIS — Z9049 Acquired absence of other specified parts of digestive tract: Secondary | ICD-10-CM | POA: Diagnosis not present

## 2017-07-20 DIAGNOSIS — K760 Fatty (change of) liver, not elsewhere classified: Secondary | ICD-10-CM | POA: Diagnosis not present

## 2017-07-20 LAB — HEPATIC FUNCTION PANEL
ALK PHOS: 75 U/L (ref 39–117)
ALT: 10 U/L (ref 0–35)
AST: 16 U/L (ref 0–37)
Albumin: 4.1 g/dL (ref 3.5–5.2)
BILIRUBIN DIRECT: 0.1 mg/dL (ref 0.0–0.3)
BILIRUBIN TOTAL: 0.6 mg/dL (ref 0.2–1.2)
Total Protein: 7.2 g/dL (ref 6.0–8.3)

## 2017-07-24 NOTE — Progress Notes (Addendum)
Subjective:   Crystal [name] is a 66 y.o. female who presents for an Initial Medicare Annual Wellness Visit.  Review of Systems    No ROS.  Medicare Wellness Visit. Additional risk factors are reflected in the social history.     Sleep patterns: feels rested on waking, gets up 1 times nightly to void and sleeps 6-7 hours nightly.    Home Safety/Smoke Alarms: Feels safe in home. Smoke alarms in place.  Living environment; residence and Firearm Safety: 1-story house/ trailer, no firearms Lives alone, no needs for DME, good support system. Seat Belt Safety/Bike Helmet: Wears seat belt.    Objective:    There were no vitals filed for this visit. There is no height or weight on file to calculate BMI.   Current Medications (verified) Outpatient Encounter Prescriptions as of 07/27/2017  Medication Sig  . ALPRAZolam (XANAX) 0.5 MG tablet Take 1-2 tablets (0.5-1 mg total) by mouth at bedtime as needed.  Marland Kitchen aspirin 325 MG tablet Take 325 mg by mouth as needed for mild pain.   . Cholecalciferol (VITAMIN D) 1000 UNITS capsule Take 1,000 Units by mouth daily.    . citalopram (CELEXA) 10 MG tablet Take 1 tablet (10 mg total) by mouth daily.  . citalopram (CELEXA) 10 MG tablet TAKE 1 TABLET EVERY DAY  . Fexofenadine HCl (MUCINEX ALLERGY PO) Take 600 mg by mouth as needed.  . folic acid (FOLVITE) 1 MG tablet Take 1 mg by mouth daily.    Marland Kitchen loratadine (CLARITIN) 10 MG tablet Take 10 mg by mouth daily as needed.   Marland Kitchen losartan-hydrochlorothiazide (HYZAAR) 50-12.5 MG tablet Take 1 tablet by mouth daily.  . ranitidine (ZANTAC) 150 MG tablet Take 1 tablet (150 mg total) by mouth 2 (two) times daily.  . Turmeric 500 MG CAPS Take by mouth daily.   Facility-Administered Encounter Medications as of 07/27/2017  Medication  . 0.9 %  sodium chloride infusion    Allergies (verified) Ace inhibitors and Penicillins   History: Past Medical History:  Diagnosis Date  . ALLERGIC RHINITIS   . Allergy     . ANXIETY    no medations needed  . Arthritis   . Bronchitis   . Cataract    no surgery  . COLONIC POLYPS, HX OF   . COPD    pt unsure of this dx  . Cough   . DEPRESSION   . GERD   . GLUCOSE INTOLERANCE   . HYPERTENSION   . IBS   . Impaired glucose tolerance   . OSTEOPENIA   . Osteopenia   . PERIPHERAL EDEMA   . Pneumonia, organism unspecified(486)   . TOBACCO USE DISORDER/SMOKER-SMOKING CESSATION DISCUSSED    Past Surgical History:  Procedure Laterality Date  . birthmark removed     from back as a child  . bladder mesh     2012  . CESAREAN SECTION     x2  . CHOLECYSTECTOMY    . COLONOSCOPY    . WISDOM TOOTH EXTRACTION     Family History  Problem Relation Age of Onset  . Breast cancer Sister 40       +lump and tamoxifen; eventually had bilateral mastectomies  . Colon cancer Father        dx. >68  . Bladder Cancer Father        dx. 69-79; not a smoker  . Skin cancer Father        basal cell carcinoma  . Colon cancer Paternal  Grandfather        unspecified age  . Melanoma Paternal Grandfather        unspecified age  . Skin cancer Mother        NOS; dx. 38s  . Other Mother        hx of hysterectomy in her early 35s for fibroid cysts  . Heart attack Maternal Uncle 67  . Heart attack Maternal Grandmother 62  . Thyroid cancer Paternal Grandmother 88  . Breast cancer Sister        dx. early 94s; s/p BL mastectomies  . Cervical cancer Sister        dx. late 61s  . Skin cancer Brother        NOS  . Anxiety disorder Unknown   . Hypothyroidism Unknown   . Hyperlipidemia Unknown   . Diabetes Unknown   . Esophageal cancer Neg Hx   . Stomach cancer Neg Hx   . Rectal cancer Neg Hx    Social History   Occupational History  . 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 Smoking status: Former Smoker    Packs/day: 0.50    Years: 35.00    Types: Cigarettes    Quit date: 12/13/2015  . Smokeless tobacco:  Never Used     Comment: stopped 12-13-15  . Alcohol use 12.6 oz/week    14 Standard drinks or equivalent, 7 Glasses of wine per week  . Drug use: No  . Sexual activity: Not on file    Tobacco Counseling Counseling given: Not Answered   Activities of Daily Living No flowsheet data found.  Immunizations and Health Maintenance Immunization History  Administered Date(s) Administered  . Hepatitis B, adult 01/23/2017, 02/23/2017  . Pneumococcal Conjugate-13 03/05/2016  . Pneumococcal Polysaccharide-23 03/11/2017  . Td 12/21/2006  . Tdap 02/17/2013   Health Maintenance Due  Topic Date Due  . INFLUENZA VACCINE  05/20/2017    Patient Care Team: Biagio Borg, MD as PCP - General (Internal Medicine)  Indicate any recent Medical Services you may have received from other than [facility] providers in the past year (date may be approximate).     Assessment:   This is a routine wellness examination for Crystal [name].Physical assessment deferred to PCP.   Hearing/Vision screen No exam data present  Dietary issues and exercise activities discussed:   Diet (meal preparation, eat out, water intake, caffeinated beverages, dairy products, fruits and vegetables): in general, a "healthy" diet  , well balanced, eats a variety of fruits and vegetables daily, limits salt, fat/cholesterol, sugar, caffeine, drinks 6-8 glasses of water daily.    Goals    None     Depression Screen PHQ 2/9 Scores 03/11/2017 03/19/2016 10/18/2015 10/10/2014  PHQ - 2 Score 0 0 1 0  PHQ- 9 Score 1 - - -    Fall Risk Fall Risk  03/11/2017 03/19/2016 10/18/2015 10/10/2014  Falls in the past year? No No No Yes  Number falls in past yr: - - - 1  Injury with Fall? - - - Yes  Comment - - - sliipped on ice    Cognitive Function:       Ad8 score reviewed for issues:  Issues making decisions: no  Less interest in hobbies / activities: no  Repeats questions, stories (family complaining): no  Trouble using ordinary  gadgets (microwave, computer, phone):no  Forgets the month or year: no  Mismanaging finances: no  Remembering appts: no  Daily problems with thinking and/or memory: no Ad8 score is= 0  Screening Tests Health Maintenance  Topic Date Due  . INFLUENZA VACCINE  05/20/2017  . HEMOGLOBIN A1C  09/09/2017  . OPHTHALMOLOGY EXAM  10/20/2017  . FOOT EXAM  03/11/2018  . MAMMOGRAM  07/18/2019  . COLONOSCOPY  04/16/2020  . TETANUS/TDAP  02/18/2023  . DEXA SCAN  Completed  . Hepatitis C Screening  Completed  . PNA vac Low Risk Adult  Completed      Plan:    Continue doing brain stimulating activities (puzzles, reading, adult coloring books, staying active) to keep memory sharp.   Continue to eat heart healthy diet (full of fruits, vegetables, whole grains, lean protein, water--limit salt, fat, and sugar intake) and increase physical activity as tolerated.  I have personally reviewed and noted the following in the patient's chart:   . Medical and social history . Use of alcohol, tobacco or illicit drugs  . Current medications and supplements . Functional ability and status . Nutritional status . Physical activity . Advanced directives . List of other physicians . Vitals . Screenings to include cognitive, depression, and falls . Referrals and appointments  In addition, I have reviewed and discussed with patient certain preventive protocols, quality metrics, and best practice recommendations. A written personalized care plan for preventive services as well as general preventive health recommendations were provided to patient.     Michiel Cowboy, RN   07/24/2017    Medical screening examination/treatment/procedure(s) were performed by non-physician practitioner and as supervising physician I was immediately available for consultation/collaboration. I agree with above. Binnie Rail, MD

## 2017-07-24 NOTE — Progress Notes (Signed)
Pre visit review using our clinic review tool, if applicable. No additional management support is needed unless otherwise documented below in the visit note. 

## 2017-07-27 ENCOUNTER — Ambulatory Visit (INDEPENDENT_AMBULATORY_CARE_PROVIDER_SITE_OTHER): Payer: Medicare HMO | Admitting: *Deleted

## 2017-07-27 ENCOUNTER — Ambulatory Visit (INDEPENDENT_AMBULATORY_CARE_PROVIDER_SITE_OTHER): Payer: Medicare HMO | Admitting: Gastroenterology

## 2017-07-27 VITALS — BP 132/84 | HR 91 | Resp 20 | Ht 63.0 in | Wt 191.0 lb

## 2017-07-27 DIAGNOSIS — Z0000 Encounter for general adult medical examination without abnormal findings: Secondary | ICD-10-CM | POA: Diagnosis not present

## 2017-07-27 DIAGNOSIS — K760 Fatty (change of) liver, not elsewhere classified: Secondary | ICD-10-CM | POA: Diagnosis not present

## 2017-07-27 MED ORDER — HEPATITIS B VAC RECOMBINANT 5 MCG/0.5ML IJ SUSP
0.5000 mL | Freq: Once | INTRAMUSCULAR | Status: AC
  Administered 2017-07-27: 1 mL via INTRAMUSCULAR

## 2017-07-27 NOTE — Patient Instructions (Signed)
2 peds doses of Hep B 0.54m given for a total of 1 ml

## 2017-07-27 NOTE — Patient Instructions (Addendum)
Continue doing brain stimulating activities (puzzles, reading, adult coloring books, staying active) to keep memory sharp.   Continue to eat heart healthy diet (full of fruits, vegetables, whole grains, lean protein, water--limit salt, fat, and sugar intake) and increase physical activity as tolerated.   [name] , Thank you for taking time to come for your Medicare Wellness Visit. I appreciate your ongoing commitment to your health goals. Please review the following plan we discussed and let me know if I can assist you in the future.   These are the goals we discussed: Goals    None      This is a list of the screening recommended for you and due dates:  Health Maintenance  Topic Date Due  . Flu Shot  07/27/2018*  . Hemoglobin A1C  09/09/2017  . Eye exam for diabetics  10/20/2017  . Complete foot exam   03/11/2018  . Mammogram  07/18/2019  . Colon Cancer Screening  04/16/2020  . Tetanus Vaccine  02/18/2023  . DEXA scan (bone density measurement)  Completed  .  Hepatitis C: One time screening is recommended by Center for Disease Control  (CDC) for  adults born from [birth year] through 1966.   Completed  . Pneumonia vaccines  Completed  *Topic was postponed. The date shown is not the original due date.

## 2017-07-30 ENCOUNTER — Encounter: Payer: Self-pay | Admitting: Internal Medicine

## 2017-07-30 ENCOUNTER — Other Ambulatory Visit: Payer: Self-pay | Admitting: Internal Medicine

## 2017-07-30 DIAGNOSIS — M810 Age-related osteoporosis without current pathological fracture: Secondary | ICD-10-CM

## 2017-07-30 HISTORY — DX: Age-related osteoporosis without current pathological fracture: M81.0

## 2017-07-30 MED ORDER — ALENDRONATE SODIUM 70 MG PO TABS: 70.0000 mg | ORAL_TABLET | ORAL | 3 refills | Status: DC

## 2017-07-30 NOTE — Telephone Encounter (Signed)
Pt has been informed and expressed understanding.  

## 2017-07-30 NOTE — Telephone Encounter (Signed)
Received DXA report from [facility] T score is -2.8, which is c/w osteoporosis  Please start fosamax 70 mg weekly (or we could consider Prolia shots if she likes)

## 2017-08-16 ENCOUNTER — Other Ambulatory Visit: Payer: Self-pay | Admitting: Internal Medicine

## 2017-08-17 ENCOUNTER — Telehealth: Payer: Self-pay

## 2017-08-17 NOTE — Telephone Encounter (Signed)
Pt has been informed and expressed understanding.  

## 2017-08-17 NOTE — Telephone Encounter (Signed)
-----   Message from Biagio Borg, MD sent at 08/16/2017  1:18 PM EDT ----- Correction - pt is already on fosamax  Pt should continue same tx

## 2017-08-26 ENCOUNTER — Ambulatory Visit: Payer: Medicare HMO | Admitting: Gastroenterology

## 2017-09-04 ENCOUNTER — Telehealth: Payer: Self-pay | Admitting: Internal Medicine

## 2017-09-04 MED ORDER — ALENDRONATE SODIUM 70 MG PO TABS: 70.0000 mg | ORAL_TABLET | ORAL | 2 refills | Status: DC

## 2017-09-04 NOTE — Telephone Encounter (Signed)
[facility] called stating that the pt contacted them requesting a 90 day supply of alendronate (FOSAMAX) 70 MG tablet be filled through their pharmacy.

## 2017-09-04 NOTE — Telephone Encounter (Signed)
Per chart MD approved but was sent to [facility]. Resent rx to [facility]...Johny Chess

## 2017-10-06 ENCOUNTER — Telehealth: Payer: Self-pay

## 2017-10-06 NOTE — Telephone Encounter (Signed)
Yes, please , ok to start prolia, please help start

## 2017-10-06 NOTE — Telephone Encounter (Signed)
Insurance will be verified and I will call patient to discuss

## 2017-10-06 NOTE — Telephone Encounter (Signed)
Patient is not doing very well with fosamax--indigestion/reflux issues---she is requesting to start prolia---routing to dr Jenny Reichmann, are you ok with patient starting prolia?

## 2017-10-19 ENCOUNTER — Ambulatory Visit: Payer: Medicare HMO | Admitting: Gastroenterology

## 2017-10-19 ENCOUNTER — Encounter: Payer: Self-pay | Admitting: Gastroenterology

## 2017-10-19 ENCOUNTER — Other Ambulatory Visit: Payer: Self-pay | Admitting: Internal Medicine

## 2017-10-19 VITALS — BP 134/78 | HR 92 | Ht 63.0 in | Wt 190.0 lb

## 2017-10-19 DIAGNOSIS — D126 Benign neoplasm of colon, unspecified: Secondary | ICD-10-CM

## 2017-10-19 DIAGNOSIS — K219 Gastro-esophageal reflux disease without esophagitis: Secondary | ICD-10-CM | POA: Diagnosis not present

## 2017-10-19 DIAGNOSIS — K760 Fatty (change of) liver, not elsewhere classified: Secondary | ICD-10-CM

## 2017-10-19 DIAGNOSIS — R1310 Dysphagia, unspecified: Secondary | ICD-10-CM | POA: Diagnosis not present

## 2017-10-19 NOTE — Patient Instructions (Addendum)
If you are age 67 or older, your body mass index should be between 23-30. Your Body mass index is 33.66 kg/m. If this is out of the aforementioned range listed, please consider follow up with your Primary Care Provider.  If you are age 42 or younger, your body mass index should be between 19-25. Your Body mass index is 33.66 kg/m. If this is out of the aformentioned range listed, please consider follow up with your Primary Care Provider.    Thank you for entrusting me with your care and for [facility], Dr. Hopkins Cellar

## 2017-10-19 NOTE — Progress Notes (Signed)
HPI :  66 y/o female here for a follow up visit. She previously had an EGD for dysphagia and GERD done in June 2018 as below. She had a benign appearing distal esophageal stenosis and a hiatal hernia, however unfortunately due to recurrent transient oxygen desaturations, dilation was not performed and the procedure was terminated early due to this issue. She recovered without any issues. While being on Zantac and stopping routine alcohol use, her reflux was significantly improved and dysphagia not bothering her much at all. She has a history of osteoporosis and stopped her Zantac she was told to avoid antacids. She reports having mild reflux symptoms but not significantly. Her colonoscopy was otherwise remarkable for 5 small adenomas which were removed.  In regards to her elevated ALT, she has significantly cut back on her alcohol use, which normalized her liver enzymes. She previously drank 1/2 gallon of vodka per week which she stopped, now having a glass of wine periodically but not routinely. Liver ultrasound showed fatty infiltration, with common bile duct at upper limits of normal post cholecystectomy. Lab testing for other chronic liver diseases was negative.  She otherwise feels well today without any complaints. No abdominal pains or bowel habit changes.  Endoscopic history: Colonoscopy 01/01/16 - 12 polyps removed, around 10 adenomas. She was seen by genetic counselor and testing was normal.  EGD 04/16/17 - benign distal esophageal stenosis, hiatal hernia. Nodular duodenal mucosa - biopsied c/w benign burnner gland hyperplasia, patient had severe coughing and oxygen saturation with repeated attempts at anesthesia and case aborted -  Colonoscopy 04/16/17 - left sided diverticulosis, 5 small adenomas, cecal AVM - recall colonoscopy 3 years  ALT Elevation - minimze alcohol use.  [place] 07/20/17 - dilated proximal CBD to 15m - ULN in post-chole state, otherwise fatty liver Labs for chronic liver  diseases negative  Father had colon cancer and bladder cancer, colon cancer diagnosed in his late 737s   Past Medical History:  Diagnosis Date  . ALLERGIC RHINITIS   . Allergy   . ANXIETY    no medations needed  . Arthritis   . Bronchitis   . Cataract    no surgery  . COLONIC POLYPS, HX OF   . COPD    pt unsure of this dx  . Cough   . DEPRESSION   . Fatty liver   . GERD   . GLUCOSE INTOLERANCE   . HYPERTENSION   . IBS   . Impaired glucose tolerance   . OSTEOPENIA   . Osteopenia   . Osteoporosis 07/30/2017  . PERIPHERAL EDEMA   . Pneumonia, organism unspecified(486)   . TOBACCO USE DISORDER/SMOKER-SMOKING CESSATION DISCUSSED      Past Surgical History:  Procedure Laterality Date  . birthmark removed     from back as a child  . bladder mesh     2012  . CESAREAN SECTION     x2  . CHOLECYSTECTOMY    . COLONOSCOPY    . WISDOM TOOTH EXTRACTION     Family History  Problem Relation Age of Onset  . Breast cancer Sister 40       +lump and tamoxifen; eventually had bilateral mastectomies  . Colon cancer Father        dx. >>70 . Bladder Cancer Father        dx. 7106-79 not a smoker  . Skin cancer Father        basal cell carcinoma  . Colon cancer Paternal Grandfather  unspecified age  . Melanoma Paternal Grandfather        unspecified age  . Skin cancer Mother        NOS; dx. 59s  . Other Mother        hx of hysterectomy in her early 55s for fibroid cysts  . Heart attack Maternal Uncle 67  . Heart attack Maternal Grandmother 62  . Thyroid cancer Paternal Grandmother 88  . Breast cancer Sister        dx. early 99s; s/p BL mastectomies  . Cervical cancer Sister        dx. late 48s  . Skin cancer Brother        NOS  . Anxiety disorder Unknown   . Hypothyroidism Unknown   . Hyperlipidemia Unknown   . Diabetes Unknown   . Esophageal cancer Neg Hx   . Stomach cancer Neg Hx   . Rectal cancer Neg Hx    Social History   Tobacco Use  . Smoking  status: Former Smoker    Packs/day: 0.50    Years: 35.00    Pack years: 17.50    Types: Cigarettes    Last attempt to quit: 12/13/2015    Years since quitting: 1.8  . Smokeless tobacco: Never Used  . Tobacco comment: stopped 12-13-15  Substance Use Topics  . Alcohol use: Yes    Alcohol/week: 12.6 oz    Types: 7 Glasses of wine, 14 Standard drinks or equivalent per week  . Drug use: No   Current Outpatient Medications  Medication Sig Dispense Refill  . alendronate (FOSAMAX) 70 MG tablet Take 1 tablet (70 mg total) every 7 (seven) days by mouth. Take with a full glass of water on an empty stomach. 12 tablet 2  . ALPRAZolam (XANAX) 0.5 MG tablet Take 1-2 tablets (0.5-1 mg total) by mouth at bedtime as needed. 60 tablet 5  . aspirin 325 MG tablet Take 325 mg by mouth as needed for mild pain.     . Calcium Carb-Cholecalciferol (CALCIUM 600+D) 600-800 MG-UNIT TABS     . Calcium Carb-Cholecalciferol [phone] MG-UNIT CAPS Place 1 tablet onto teeth.    . Cholecalciferol (VITAMIN D PO) Take by mouth daily.    . citalopram (CELEXA) 10 MG tablet Take 1 tablet (10 mg total) by mouth daily. 90 tablet 3  . Fexofenadine HCl (MUCINEX ALLERGY PO) Take 600 mg by mouth as needed.    . folic acid (FOLVITE) 629 MCG tablet Take 400 mcg by mouth daily.    Marland Kitchen loratadine (CLARITIN) 10 MG tablet Take 10 mg by mouth daily as needed.     Marland Kitchen losartan-hydrochlorothiazide (HYZAAR) 50-12.5 MG tablet Take 1 tablet by mouth daily. 90 tablet 3  . TURMERIC PO Take 1,000 mg by mouth daily. Liquid     No current facility-administered medications for this visit.    Allergies  Allergen Reactions  . Ace Inhibitors Cough  . Penicillins Rash     Review of Systems: All systems reviewed and negative except where noted in HPI.   Lab Results  Component Value Date   WBC 7.6 03/09/2017   HGB 15.2 (H) 03/09/2017   HCT 43.9 03/09/2017   MCV 99.1 03/09/2017   PLT 302.0 03/09/2017    Lab Results  Component Value Date    CREATININE 0.70 03/09/2017   BUN 20 03/09/2017   NA 140 03/09/2017   K 4.8 03/09/2017   CL 104 03/09/2017   CO2 30 03/09/2017    Lab Results  Component Value Date   ALT 10 07/20/2017   AST 16 07/20/2017   ALKPHOS 75 07/20/2017   BILITOT 0.6 07/20/2017     Physical Exam: BP 134/78 (BP Location: Left Arm, Patient Position: Sitting, Cuff Size: Normal)   Pulse 92   Ht 5' 3"  (1.6 m) Comment: height measured without shoes  Wt 190 lb (86.2 kg)   BMI 33.66 kg/m  Constitutional: Pleasant,well-developed, female in no acute distress. HEENT: Normocephalic and atraumatic. Conjunctivae are normal. No scleral icterus. Neck supple.  Cardiovascular: Normal rate, regular rhythm.  Pulmonary/chest: Effort normal and breath sounds normal. No wheezing, rales or rhonchi. Abdominal: Soft, nondistended, nontender. . There are no masses palpable. No hepatomegaly. Extremities: no edema Lymphadenopathy: No cervical adenopathy noted. Neurological: Alert and oriented to person place and time. Skin: Skin is warm and dry. No rashes noted. Psychiatric: Normal mood and affect. Behavior is normal.   ASSESSMENT AND PLAN: 66 year old female here for reassessment of the following issues:  GERD / dysphagia - EGD findings as above, no evidence of Barrett's esophagus, she is a hiatal hernia with a benign mild distal  esophageal stenosis. Dilation not performed given recurrent transient oxygen desaturations with anesthesia. Interestingly her dysphagia has resolved since stopping alcohol and using Zantac as needed. Given her dysphagia has resolved we don't need to repeat an endoscopy at this time, she will monitor for symptoms and call me if she has an recurrence. If she does need a repeat endoscopy in the future, this should be done at the hospital with anesthesia support. Otherwise if she is having heartburn it is safe to use Zantac as needed. In regards to osteoporosis she should try to avoid PPIs, as these can  potentially increase the risk of hip fracture. She verbalized understanding and agreed with the plan. Follow-up as needed for these issues.  Fatty liver - I suspect due to alcohol use, no evidence of cirrhosis on imaging or labs, although counseled her she is at risk for this if she continues to use alcohol. She has cut back on her alcohol use significantly, recommend she continue to minimize its use. Recommend LFTs once yearly at this point.  History of colon adenomas - at least 15 adenomas on the last 2 colonoscopies, genetic testing negative for polyposis syndrome. Recall colonoscopy next due in June 2021.  Belt Cellar, MD [facility] Pager [phone]

## 2017-10-21 NOTE — Telephone Encounter (Signed)
Done erx 

## 2017-11-03 ENCOUNTER — Telehealth: Payer: Self-pay

## 2017-11-03 NOTE — Telephone Encounter (Signed)
Talked with patient about starting prolia injections, patient needs to start per dr john---I have verified insurance for 2019 and summary of benefits states that patient will have estimated $220 copay---patient is currently calling [employer] to see if she qualifies for patient assistance funding---if yes, she will bring letter verifying her program acceptance to the [place] office and get first prolia injection----when patient calls back, ok to schedule first prolia at patient's earliest convenience---can talk with Francessca Friis,RN at [place] office if any further questions

## 2017-11-23 DIAGNOSIS — Z01419 Encounter for gynecological examination (general) (routine) without abnormal findings: Secondary | ICD-10-CM | POA: Diagnosis not present

## 2017-12-11 ENCOUNTER — Telehealth: Payer: Self-pay

## 2017-12-11 ENCOUNTER — Ambulatory Visit: Payer: Medicare HMO

## 2017-12-11 MED ORDER — DENOSUMAB 60 MG/ML ~~LOC~~ SOLN: 60.0000 mg | Freq: Once | SUBCUTANEOUS | 1 refills | Status: AC

## 2017-12-16 DIAGNOSIS — H02401 Unspecified ptosis of right eyelid: Secondary | ICD-10-CM | POA: Diagnosis not present

## 2017-12-16 DIAGNOSIS — H52223 Regular astigmatism, bilateral: Secondary | ICD-10-CM | POA: Diagnosis not present

## 2017-12-16 DIAGNOSIS — H31091 Other chorioretinal scars, right eye: Secondary | ICD-10-CM | POA: Diagnosis not present

## 2017-12-16 DIAGNOSIS — H5203 Hypermetropia, bilateral: Secondary | ICD-10-CM | POA: Diagnosis not present

## 2017-12-16 DIAGNOSIS — H524 Presbyopia: Secondary | ICD-10-CM | POA: Diagnosis not present

## 2017-12-16 DIAGNOSIS — H11822 Conjunctivochalasis, left eye: Secondary | ICD-10-CM | POA: Diagnosis not present

## 2017-12-16 DIAGNOSIS — H2513 Age-related nuclear cataract, bilateral: Secondary | ICD-10-CM | POA: Diagnosis not present

## 2017-12-18 ENCOUNTER — Ambulatory Visit: Payer: Medicare HMO

## 2017-12-18 DIAGNOSIS — M810 Age-related osteoporosis without current pathological fracture: Secondary | ICD-10-CM | POA: Diagnosis not present

## 2017-12-18 MED ORDER — DENOSUMAB 60 MG/ML ~~LOC~~ SOLN
60.0000 mg | Freq: Once | SUBCUTANEOUS | Status: AC
  Administered 2017-12-18: 60 mg via SUBCUTANEOUS

## 2017-12-18 NOTE — Progress Notes (Signed)
Medical screening examination/treatment/procedure(s) were performed by non-physician practitioner and as supervising physician I was immediately available for consultation/collaboration. I agree with above. James John, MD   

## 2018-01-05 ENCOUNTER — Telehealth: Payer: Self-pay

## 2018-01-05 NOTE — Telephone Encounter (Signed)
Copied from [place]. Topic: Inquiry >> Jan 05, 2018  6:54 AM Crystal [name] wrote: Reason for CRM: Patient called stating that she has not received a referral for to see Dr. Tamala Julian in our office. Patient stated that Dr. Jenny Reichmann was to send a referral to Dr. Tamala Julian. Patient wants to schedule an appt with Dr. Tamala Julian asap. Patient also wants Jonelle Sidle to call her this morning. Patient states that she had been working on a grant with Miss Brogan England's help. Please call patient at [phone].        Thank You!!!  Lindsay/sports med advised of patient's request for dr smith's office to call her----and patient had further questions about how to get reimbursement from [employer]---I have explained---patient will be bringing EOB/Summary of benefits from insurance co when she gets it in the mail, we will complete [employer] form together here in our office and I will fax completed papers to [employer] for reimbursement of copay she paid our office on prolia administration day (12/18/17)

## 2018-01-15 DIAGNOSIS — J00 Acute nasopharyngitis [common cold]: Secondary | ICD-10-CM | POA: Diagnosis not present

## 2018-01-15 DIAGNOSIS — R05 Cough: Secondary | ICD-10-CM | POA: Diagnosis not present

## 2018-01-17 NOTE — Progress Notes (Signed)
[name] [address], [place] [address] Phone: [phone] Subjective:      CC: Bilateral knee pain  EUM:[id number]  Crystal [name] is a 67 y.o. female coming in with complaint of bilateral knee pain. Sleeps with pillow under and between knees. Does water aerobics. Back of left knee hurts with flutter kicks. History of cyist. Right foot lateral 3 toes goes numb. Left hip pain. Tightness runs up and down lateral legs. Walking for 15 mins her legs get tight (walks 30 mins a day). She states she stretches and rolls before she walk.s   Onset- Chronic Location- Medial Duration-patient states that it seems to be occurring more frequently. Character- Sore Aggravating factors- Stairs, flutter kick, jumping Reliving factors- Ice Therapies tried-  Severity-5 out of 10 and worsening   Patient did have x-rays done on January 09, 2016.  Independently visualized by me showing mild to moderate osteoarthritic changes but severe osteoarthritic changes of the patellofemoral compartment.  Past Medical History:  Diagnosis Date  . ALLERGIC RHINITIS   . Allergy   . ANXIETY    no medations needed  . Arthritis   . Bronchitis   . Cataract    no surgery  . COLONIC POLYPS, HX OF   . COPD    pt unsure of this dx  . Cough   . DEPRESSION   . Fatty liver   . GERD   . GLUCOSE INTOLERANCE   . HYPERTENSION   . IBS   . Impaired glucose tolerance   . OSTEOPENIA   . Osteopenia   . Osteoporosis 07/30/2017  . PERIPHERAL EDEMA   . Pneumonia, organism unspecified(486)   . TOBACCO USE DISORDER/SMOKER-SMOKING CESSATION DISCUSSED    Past Surgical History:  Procedure Laterality Date  . birthmark removed     from back as a child  . bladder mesh     2012  . CESAREAN SECTION     x2  . CHOLECYSTECTOMY    . COLONOSCOPY    . WISDOM TOOTH EXTRACTION     Social History   Socioeconomic History  . Marital status: Widowed    Spouse name: Not on file  . Number  of children: 2  . Years of education: Not on file  . Highest education level: Not on file  Occupational History  . Occupation: Occupational psychologist industries, Museum/gallery curator for [employer]  Social Needs  . Financial resource strain: Not on file  . Food insecurity:    Worry: Not on file    Inability: Not on file  . Transportation needs:    Medical: Not on file    Non-medical: Not on file  Tobacco Use  . Smoking status: Former Smoker    Packs/day: 0.50    Years: 35.00    Pack years: 17.50    Types: Cigarettes    Last attempt to quit: 12/13/2015    Years since quitting: 2.1  . Smokeless tobacco: Never Used  . Tobacco comment: stopped 12-13-15  Substance and Sexual Activity  . Alcohol use: Yes    Alcohol/week: 12.6 oz    Types: 7 Glasses of wine, 14 Standard drinks or equivalent per week  . Drug use: No  . Sexual activity: Not on file  Lifestyle  . Physical activity:    Days per week: Not on file    Minutes per session: Not on file  . Stress: Not on file  Relationships  . Social connections:    Talks on  phone: Not on file    Gets together: Not on file    Attends religious service: Not on file    Active member of club or organization: Not on file    Attends meetings of clubs or organizations: Not on file    Relationship status: Not on file  Other Topics Concern  . Not on file  Social History Narrative  . Not on file   Allergies  Allergen Reactions  . Ace Inhibitors Cough  . Penicillins Rash   Family History  Problem Relation Age of Onset  . Breast cancer Sister 40       +lump and tamoxifen; eventually had bilateral mastectomies  . Colon cancer Father        dx. >61  . Bladder Cancer Father        dx. 54-79; not a smoker  . Skin cancer Father        basal cell carcinoma  . Colon cancer Paternal Grandfather        unspecified age  . Melanoma Paternal Grandfather        unspecified age  . Skin cancer Mother        NOS; dx. 26s  . Other Mother        hx of  hysterectomy in her early 16s for fibroid cysts  . Heart attack Maternal Uncle 67  . Heart attack Maternal Grandmother 62  . Thyroid cancer Paternal Grandmother 88  . Breast cancer Sister        dx. early 52s; s/p BL mastectomies  . Cervical cancer Sister        dx. late 10s  . Skin cancer Brother        NOS  . Anxiety disorder Unknown   . Hypothyroidism Unknown   . Hyperlipidemia Unknown   . Diabetes Unknown   . Esophageal cancer Neg Hx   . Stomach cancer Neg Hx   . Rectal cancer Neg Hx      Past medical history, social, surgical and family history all reviewed in electronic medical record.  No pertanent information unless stated regarding to the chief complaint.   Review of Systems:Review of systems updated and as accurate as of 01/18/18  No headache, visual changes, nausea, vomiting, diarrhea, constipation, dizziness, abdominal pain, skin rash, fevers, chills, night sweats, weight loss, swollen lymph nodes, body aches, joint swelling, chest pain, shortness of breath, mood changes.  Positive muscle aches  Objective  Blood pressure 138/82, pulse 88, height 5' 3"  (1.6 m), weight 194 lb (88 kg), SpO2 97 %. Systems examined below as of 01/18/18   General: No apparent distress alert and oriented x3 mood and affect normal, dressed appropriately.  HEENT: Pupils equal, extraocular movements intact  Respiratory: Patient's speak in full sentences and does not appear short of breath  Cardiovascular: No lower extremity edema, non tender, no erythema  Skin: Warm dry intact with no signs of infection or rash on extremities or on axial skeleton.  Abdomen: Soft nontender  Neuro: Cranial nerves II through XII are intact, neurovascularly intact in all extremities with 2+ DTRs and 2+ pulses.  Lymph: No lymphadenopathy of posterior or anterior cervical chain or axillae bilaterally.  Gait normal with good balance and coordination.  MSK:  Non tender with full range of motion and good stability and  symmetric strength and tone of shoulders, elbows, wrist, hip and ankles bilaterally.  Mild arthritic changes of multiple joints Knee: Bilateral valgus deformity noted. Large thigh to calf ratio.  Tender  to palpation over medial and PF joint line.  ROM full in flexion and extension and lower leg rotation. instability with valgus force.  painful patellar compression. Patellar glide with moderate crepitus. Patellar and quadriceps tendons unremarkable. Hamstring and quadriceps strength is normal.  After informed written and verbal consent, patient was seated on exam table. Right knee was prepped with alcohol swab and utilizing anterolateral approach, patient's right knee space was injected with 4:1  marcaine 0.5%: Kenalog 31m/dL. Patient tolerated the procedure well without immediate complications.  After informed written and verbal consent, patient was seated on exam table. Left knee was prepped with alcohol swab and utilizing anterolateral approach, patient's left knee space was injected with 4:1  marcaine 0.5%: Kenalog 470mdL. Patient tolerated the procedure well without immediate complications.     Impression and Recommendations:     This case required medical decision making of moderate complexity.      Note: This dictation was prepared with Dragon dictation along with smaller phrase technology. Any transcriptional errors that result from this process are unintentional.

## 2018-01-18 ENCOUNTER — Ambulatory Visit: Payer: Medicare HMO | Admitting: Family Medicine

## 2018-01-18 ENCOUNTER — Encounter: Payer: Self-pay | Admitting: Family Medicine

## 2018-01-18 DIAGNOSIS — M170 Bilateral primary osteoarthritis of knee: Secondary | ICD-10-CM

## 2018-01-18 NOTE — Patient Instructions (Signed)
Good to see you  Crystal [name] is your friend. Ice 20 minutes 2 times daily. Usually after activity and before bed. pennsaid pinkie amount topically 2 times daily as needed.  We will try to get the other injections approved See me again in 3-4 weeks

## 2018-01-18 NOTE — Assessment & Plan Note (Signed)
Bilateral knee arthritis.  Discussed icing regimen and home exercises.  Discussed which activities of doing which wants to avoid.  Patient is to increase activity as tolerated.  Topical anti-inflammatories given.  Follow-up again in 4 weeks.  Worsening symptoms patient could be candidate for Visco supplementation

## 2018-01-21 ENCOUNTER — Encounter: Payer: Self-pay | Admitting: Internal Medicine

## 2018-01-21 ENCOUNTER — Ambulatory Visit (INDEPENDENT_AMBULATORY_CARE_PROVIDER_SITE_OTHER): Payer: Medicare HMO | Admitting: Internal Medicine

## 2018-01-21 VITALS — BP 128/84 | HR 88 | Temp 98.3°F | Ht 63.0 in | Wt 195.0 lb

## 2018-01-21 DIAGNOSIS — R32 Unspecified urinary incontinence: Secondary | ICD-10-CM | POA: Diagnosis not present

## 2018-01-21 DIAGNOSIS — I10 Essential (primary) hypertension: Secondary | ICD-10-CM | POA: Diagnosis not present

## 2018-01-21 DIAGNOSIS — J019 Acute sinusitis, unspecified: Secondary | ICD-10-CM

## 2018-01-21 MED ORDER — LOSARTAN POTASSIUM 50 MG PO TABS
50.0000 mg | ORAL_TABLET | Freq: Every day | ORAL | 3 refills | Status: DC
Start: 2018-01-21 — End: 2018-03-08

## 2018-01-21 MED ORDER — HYDROCODONE-HOMATROPINE 5-1.5 MG/5ML PO SYRP: 5.0000 mL | ORAL_SOLUTION | Freq: Four times a day (QID) | ORAL | 0 refills | Status: AC | PRN

## 2018-01-21 MED ORDER — LEVOFLOXACIN 500 MG PO TABS: 500.0000 mg | ORAL_TABLET | Freq: Every day | ORAL | 0 refills | Status: AC

## 2018-01-21 NOTE — Progress Notes (Signed)
Subjective:    Patient ID: Crystal [name], female    DOB: 12-27-1950, 67 y.o.   MRN: [id number]  HPI   Here with 7 days acute onset fever, facial pain, pressure, headache, general weakness and malaise, and greenish d/c, with mild ST and cough, but pt denies chest pain, wheezing, increased sob or doe, orthopnea, PND, increased LE swelling, palpitations, dizziness or syncope.  Was seen at [facility] tx with afrin and tessalon but symptoms only getting worse.  Pt denies new neurological symptoms such as new headache, or facial or extremity weakness or numbness   Pt denies polydipsia, polyuria No other new history or new complaint except for mild worsening incontinence and no longer able to tolerate the diuretic she takes for HTN Past Medical History:  Diagnosis Date  . ALLERGIC RHINITIS   . Allergy   . ANXIETY    no medations needed  . Arthritis   . Bronchitis   . Cataract    no surgery  . COLONIC POLYPS, HX OF   . COPD    pt unsure of this dx  . Cough   . DEPRESSION   . Fatty liver   . GERD   . GLUCOSE INTOLERANCE   . HYPERTENSION   . IBS   . Impaired glucose tolerance   . OSTEOPENIA   . Osteopenia   . Osteoporosis 07/30/2017  . PERIPHERAL EDEMA   . Pneumonia, organism unspecified(486)   . TOBACCO USE DISORDER/SMOKER-SMOKING CESSATION DISCUSSED    Past Surgical History:  Procedure Laterality Date  . birthmark removed     from back as a child  . bladder mesh     2012  . CESAREAN SECTION     x2  . CHOLECYSTECTOMY    . COLONOSCOPY    . WISDOM TOOTH EXTRACTION      reports that she quit smoking about 2 years ago. Her smoking use included cigarettes. She has a 17.50 pack-year smoking history. She has never used smokeless tobacco. She reports that she drinks about 12.6 oz of alcohol per week. She reports that she does not use drugs. family history includes Anxiety disorder in her unknown relative; Bladder Cancer in her father; Breast cancer in her sister; Breast cancer (age of  onset: 106) in her sister; Cervical cancer in her sister; Colon cancer in her father and paternal grandfather; Diabetes in her unknown relative; Heart attack (age of onset: 74) in her maternal grandmother; Heart attack (age of onset: 72) in her maternal uncle; Hyperlipidemia in her unknown relative; Hypothyroidism in her unknown relative; Melanoma in her paternal grandfather; Other in her mother; Skin cancer in her brother, father, and mother; Thyroid cancer (age of onset: 54) in her paternal grandmother. Allergies  Allergen Reactions  . Ace Inhibitors Cough  . Penicillins Rash   Current Outpatient Medications on File Prior to Visit  Medication Sig Dispense Refill  . alendronate (FOSAMAX) 70 MG tablet Take 1 tablet (70 mg total) every 7 (seven) days by mouth. Take with a full glass of water on an empty stomach. 12 tablet 2  . ALPRAZolam (XANAX) 0.5 MG tablet TAKE 1 TO 2 TABLETS BY MOUTH AT BEDTIME AS NEEDED 60 tablet 4  . aspirin 325 MG tablet Take 325 mg by mouth as needed for mild pain.     . benzonatate (TESSALON) 100 MG capsule Take 100 mg by mouth 3 (three) times daily as needed for cough.    . Calcium Carb-Cholecalciferol (CALCIUM 600+D) 600-800 MG-UNIT TABS     .  Calcium Carb-Cholecalciferol [phone] MG-UNIT CAPS Place 1 tablet onto teeth.    . Cholecalciferol (VITAMIN D PO) Take by mouth daily.    . citalopram (CELEXA) 10 MG tablet Take 1 tablet (10 mg total) by mouth daily. 90 tablet 3  . Fexofenadine HCl (MUCINEX ALLERGY PO) Take 600 mg by mouth as needed.    . folic acid (FOLVITE) 177 MCG tablet Take 400 mcg by mouth daily.    Marland Kitchen loratadine (CLARITIN) 10 MG tablet Take 10 mg by mouth daily as needed.     . TURMERIC PO Take 1,000 mg by mouth daily. Liquid     No current facility-administered medications on file prior to visit.    Review of Systems  Constitutional: Negative for other unusual diaphoresis or sweats HENT: Negative for ear discharge or swelling Eyes: Negative for other  worsening visual disturbances Respiratory: Negative for stridor or other swelling  Gastrointestinal: Negative for worsening distension or other blood Genitourinary: Negative for retention or other urinary change Musculoskeletal: Negative for other MSK pain or swelling Skin: Negative for color change or other new lesions Neurological: Negative for worsening tremors and other numbness  Psychiatric/Behavioral: Negative for worsening agitation or other fatigue All other system neg per pt    Objective:   Physical Exam BP 128/84   Pulse 88   Temp 98.3 F (36.8 C) (Oral)   Ht 5' 3"  (1.6 m)   Wt 195 lb (88.5 kg)   SpO2 97%   BMI 34.54 kg/m  VS noted,  Constitutional: Pt appears in NAD HENT: Head: NCAT.  Right Ear: External ear normal.  Left Ear: External ear normal.  Eyes: . Pupils are equal, round, and reactive to light. Conjunctivae and EOM are normal Nose: without d/c or deformity Neck: Neck supple. Gross normal ROM Cardiovascular: Normal rate and regular rhythm.   Pulmonary/Chest: Effort normal and breath sounds without rales or wheezing.  Abd:  Soft, NT, ND, + BS, no organomegaly, no flank tender Neurological: Pt is alert. At baseline orientation, motor grossly intact Skin: Skin is warm. No rashes, other new lesions, no LE edema Psychiatric: Pt behavior is normal without agitation  No other exam findings    Assessment & Plan:

## 2018-01-21 NOTE — Patient Instructions (Addendum)
Please take all new medication as prescribed - the antibiotic, and cough medicine if needed  OK to also take the claritin and mucinex as needed  OK to change the losartan HCT to losartan 50 mg per day  Please continue to monitor your BP at home on a regular basis, with the goal being to be less than 140/90  Please continue all other medications as before, and refills have been done if requested.  Please have the pharmacy call with any other refills you may need.  Please continue your efforts at being more active, low cholesterol diet, and weight control.  Please keep your appointments with your specialists as you may have planned

## 2018-01-23 DIAGNOSIS — R32 Unspecified urinary incontinence: Secondary | ICD-10-CM | POA: Insufficient documentation

## 2018-01-23 NOTE — Assessment & Plan Note (Addendum)
stable overall by history and exam, recent data reviewed with pt, and pt to continue medical treatment as before,  to f/u any worsening symptoms or concerns except for change losartan HCT to losartan to avoid exacerbation of incontinence

## 2018-01-23 NOTE — Assessment & Plan Note (Signed)
Mild to mod, for antibx course,  to f/u any worsening symptoms or concerns 

## 2018-01-23 NOTE — Assessment & Plan Note (Signed)
Exam benign, declines ua and cx for now or urology referral for now

## 2018-02-01 ENCOUNTER — Encounter: Payer: Self-pay | Admitting: Internal Medicine

## 2018-02-01 ENCOUNTER — Ambulatory Visit (INDEPENDENT_AMBULATORY_CARE_PROVIDER_SITE_OTHER): Payer: Medicare HMO | Admitting: Internal Medicine

## 2018-02-01 VITALS — BP 124/78 | HR 82 | Temp 98.2°F | Ht 63.0 in | Wt 190.0 lb

## 2018-02-01 DIAGNOSIS — J309 Allergic rhinitis, unspecified: Secondary | ICD-10-CM

## 2018-02-01 DIAGNOSIS — H6690 Otitis media, unspecified, unspecified ear: Secondary | ICD-10-CM | POA: Diagnosis not present

## 2018-02-01 DIAGNOSIS — I10 Essential (primary) hypertension: Secondary | ICD-10-CM | POA: Diagnosis not present

## 2018-02-01 MED ORDER — DOXYCYCLINE HYCLATE 100 MG PO TABS: 100.0000 mg | ORAL_TABLET | Freq: Two times a day (BID) | ORAL | 0 refills | Status: DC

## 2018-02-01 MED ORDER — TRIAMCINOLONE ACETONIDE 55 MCG/ACT NA AERO: 2.0000 | INHALATION_SPRAY | Freq: Every day | NASAL | 12 refills | Status: DC

## 2018-02-01 NOTE — Patient Instructions (Addendum)
Please take all new medication as prescribed - the antibiotic, and nasacort (also available as OTC)  Please continue all other medications as before, and refills have been done if requested.  Please have the pharmacy call with any other refills you may need.  Please keep your appointments with your specialists as you may have planned

## 2018-02-01 NOTE — Assessment & Plan Note (Signed)
Mild to mod, for antibx course,  to f/u any worsening symptoms or concerns 

## 2018-02-01 NOTE — Assessment & Plan Note (Signed)
Mild to mod, for nasacort, claritin and mucinex prn,  to f/u any worsening symptoms or concerns

## 2018-02-01 NOTE — Assessment & Plan Note (Signed)
stable overall by history and exam, recent data reviewed with pt, and pt to continue medical treatment as before,  to f/u any worsening symptoms or concerns BP Readings from Last 3 Encounters:  02/01/18 124/78  01/21/18 128/84  01/18/18 138/82

## 2018-02-01 NOTE — Progress Notes (Signed)
Subjective:    Patient ID: Crystal [name], female    DOB: [birth year], 67 y.o.   MRN: [id number]  HPI  Here after recent visit apr 4 with sinus infection,  And right ear improved, throat and sinus resolved but left ear still has inordinate pressure .  Finished antibx yesterday.  Has some hearing loss, but worse with left now, muffled, but no vertigo but maybe a bit off balance.    Pt denies fever, wt loss, night sweats, loss of appetite, or other constitutional symptoms  Has long hx of eustachian valve dysfunction in past infections.  Pt denies chest pain, increased sob or doe, wheezing, orthopnea, PND, increased LE swelling, palpitations, dizziness or syncope.   Pt denies polydipsia, polyuria Past Medical History:  Diagnosis Date  . ALLERGIC RHINITIS   . Allergy   . ANXIETY    no medations needed  . Arthritis   . Bronchitis   . Cataract    no surgery  . COLONIC POLYPS, HX OF   . COPD    pt unsure of this dx  . Cough   . DEPRESSION   . Fatty liver   . GERD   . GLUCOSE INTOLERANCE   . HYPERTENSION   . IBS   . Impaired glucose tolerance   . OSTEOPENIA   . Osteopenia   . Osteoporosis 07/30/2017  . PERIPHERAL EDEMA   . Pneumonia, organism unspecified(486)   . TOBACCO USE DISORDER/SMOKER-SMOKING CESSATION DISCUSSED    Past Surgical History:  Procedure Laterality Date  . birthmark removed     from back as a child  . bladder mesh     2012  . CESAREAN SECTION     x2  . CHOLECYSTECTOMY    . COLONOSCOPY    . WISDOM TOOTH EXTRACTION      reports that she quit smoking about 2 years ago. Her smoking use included cigarettes. She has a 17.50 pack-year smoking history. She has never used smokeless tobacco. She reports that she drinks about 12.6 oz of alcohol per week. She reports that she does not use drugs. family history includes Anxiety disorder in her unknown relative; Bladder Cancer in her father; Breast cancer in her sister; Breast cancer (age of onset: 15) in her sister;  Cervical cancer in her sister; Colon cancer in her father and paternal grandfather; Diabetes in her unknown relative; Heart attack (age of onset: 9) in her maternal grandmother; Heart attack (age of onset: 50) in her maternal uncle; Hyperlipidemia in her unknown relative; Hypothyroidism in her unknown relative; Melanoma in her paternal grandfather; Other in her mother; Skin cancer in her brother, father, and mother; Thyroid cancer (age of onset: 72) in her paternal grandmother. Allergies  Allergen Reactions  . Ace Inhibitors Cough  . Penicillins Rash   Current Outpatient Medications on File Prior to Visit  Medication Sig Dispense Refill  . alendronate (FOSAMAX) 70 MG tablet Take 1 tablet (70 mg total) every 7 (seven) days by mouth. Take with a full glass of water on an empty stomach. 12 tablet 2  . ALPRAZolam (XANAX) 0.5 MG tablet TAKE 1 TO 2 TABLETS BY MOUTH AT BEDTIME AS NEEDED 60 tablet 4  . aspirin 325 MG tablet Take 325 mg by mouth as needed for mild pain.     . benzonatate (TESSALON) 100 MG capsule Take 100 mg by mouth 3 (three) times daily as needed for cough.    . Calcium Carb-Cholecalciferol (CALCIUM 600+D) 600-800 MG-UNIT TABS     .  Calcium Carb-Cholecalciferol [phone] MG-UNIT CAPS Place 1 tablet onto teeth.    . Cholecalciferol (VITAMIN D PO) Take by mouth daily.    . citalopram (CELEXA) 10 MG tablet Take 1 tablet (10 mg total) by mouth daily. 90 tablet 3  . Fexofenadine HCl (MUCINEX ALLERGY PO) Take 600 mg by mouth as needed.    . folic acid (FOLVITE) 761 MCG tablet Take 400 mcg by mouth daily.    Marland Kitchen loratadine (CLARITIN) 10 MG tablet Take 10 mg by mouth daily as needed.     Marland Kitchen losartan (COZAAR) 50 MG tablet Take 1 tablet (50 mg total) by mouth daily. 90 tablet 3  . TURMERIC PO Take 1,000 mg by mouth daily. Liquid     No current facility-administered medications on file prior to visit.    Review of Systems  Constitutional: Negative for other unusual diaphoresis or sweats HENT:  Negative for ear discharge or swelling Eyes: Negative for other worsening visual disturbances Respiratory: Negative for stridor or other swelling  Gastrointestinal: Negative for worsening distension or other blood Genitourinary: Negative for retention or other urinary change Musculoskeletal: Negative for other MSK pain or swelling Skin: Negative for color change or other new lesions Neurological: Negative for worsening tremors and other numbness  Psychiatric/Behavioral: Negative for worsening agitation or other fatigue \All other system neg per pt    Objective:   Physical Exam BP 124/78   Pulse 82   Temp 98.2 F (36.8 C) (Oral)   Ht 5' 3"  (1.6 m)   Wt 190 lb (86.2 kg)   SpO2 99%   BMI 33.66 kg/m  VS noted, mild ill Constitutional: Pt appears in NAD HENT: Head: NCAT.  Right Ear: External ear normal.  Left Ear: External ear normal. left TM with severe erythema, effusion Eyes: . Pupils are equal, round, and reactive to light. Conjunctivae and EOM are normal Nose: without d/c or deformity Neck: Neck supple. Gross normal ROM Cardiovascular: Normal rate and regular rhythm.   Pulmonary/Chest: Effort normal and breath sounds without rales or wheezing.  Neurological: Pt is alert. At baseline orientation, motor grossly intact Skin: Skin is warm. No rashes, other new lesions, no LE edema Psychiatric: Pt behavior is normal without agitation  No other exam findings     Assessment & Plan:

## 2018-03-05 ENCOUNTER — Other Ambulatory Visit (INDEPENDENT_AMBULATORY_CARE_PROVIDER_SITE_OTHER): Payer: Medicare HMO

## 2018-03-05 DIAGNOSIS — R7302 Impaired glucose tolerance (oral): Secondary | ICD-10-CM

## 2018-03-05 DIAGNOSIS — Z0000 Encounter for general adult medical examination without abnormal findings: Secondary | ICD-10-CM

## 2018-03-05 LAB — URINALYSIS, ROUTINE W REFLEX MICROSCOPIC
Ketones, ur: NEGATIVE
Leukocytes, UA: NEGATIVE
Nitrite: NEGATIVE
TOTAL PROTEIN, URINE-UPE24: NEGATIVE
Urine Glucose: NEGATIVE
Urobilinogen, UA: 0.2 (ref 0.0–1.0)
pH: 5.5 (ref 5.0–8.0)

## 2018-03-05 LAB — LIPID PANEL
Cholesterol: 195 mg/dL (ref 0–200)
HDL: 98 mg/dL (ref >39.00)
LDL Cholesterol: 88 mg/dL (ref 0–99)
NONHDL: 97.24
TRIGLYCERIDES: 45 mg/dL (ref 0.0–149.0)
Total CHOL/HDL Ratio: 2
VLDL: 9 mg/dL (ref 0.0–40.0)

## 2018-03-05 LAB — CBC WITH DIFFERENTIAL/PLATELET
Basophils Absolute: 0.1 10*3/uL (ref 0.0–0.1)
Basophils Relative: 0.9 % (ref 0.0–3.0)
Eosinophils Absolute: 0.3 10*3/uL (ref 0.0–0.7)
Eosinophils Relative: 4.3 % (ref 0.0–5.0)
HCT: 45 % (ref 36.0–46.0)
Hemoglobin: 15.3 g/dL — ABNORMAL HIGH (ref 12.0–15.0)
LYMPHS ABS: 2.5 10*3/uL (ref 0.7–4.0)
Lymphocytes Relative: 39 % (ref 12.0–46.0)
MCHC: 34 g/dL (ref 30.0–36.0)
MCV: 96.7 fl (ref 78.0–100.0)
MONOS PCT: 9.8 % (ref 3.0–12.0)
Monocytes Absolute: 0.6 10*3/uL (ref 0.1–1.0)
NEUTROS PCT: 46 % (ref 43.0–77.0)
Neutro Abs: 3 10*3/uL (ref 1.4–7.7)
Platelets: 249 10*3/uL (ref 150.0–400.0)
RBC: 4.66 Mil/uL (ref 3.87–5.11)
RDW: 14.7 % (ref 11.5–15.5)
WBC: 6.4 10*3/uL (ref 4.0–10.5)

## 2018-03-05 LAB — BASIC METABOLIC PANEL
BUN: 12 mg/dL (ref 6–23)
CALCIUM: 8.9 mg/dL (ref 8.4–10.5)
CO2: 28 meq/L (ref 19–32)
Chloride: 106 mEq/L (ref 96–112)
Creatinine, Ser: 0.62 mg/dL (ref 0.40–1.20)
GFR: 101.78 mL/min (ref >60.00)
GLUCOSE: 91 mg/dL (ref 70–99)
Potassium: 4.5 mEq/L (ref 3.5–5.1)
Sodium: 140 mEq/L (ref 135–145)

## 2018-03-05 LAB — HEMOGLOBIN A1C: HEMOGLOBIN A1C: 5.1 % (ref 4.6–6.5)

## 2018-03-05 LAB — HEPATIC FUNCTION PANEL
ALBUMIN: 4 g/dL (ref 3.5–5.2)
ALK PHOS: 51 U/L (ref 39–117)
ALT: 10 U/L (ref 0–35)
AST: 15 U/L (ref 0–37)
Bilirubin, Direct: 0.1 mg/dL (ref 0.0–0.3)
TOTAL PROTEIN: 7.2 g/dL (ref 6.0–8.3)
Total Bilirubin: 0.6 mg/dL (ref 0.2–1.2)

## 2018-03-05 LAB — TSH: TSH: 1.24 u[IU]/mL (ref 0.35–4.50)

## 2018-03-08 ENCOUNTER — Ambulatory Visit (INDEPENDENT_AMBULATORY_CARE_PROVIDER_SITE_OTHER): Payer: Medicare HMO | Admitting: Internal Medicine

## 2018-03-08 ENCOUNTER — Encounter: Payer: Self-pay | Admitting: Internal Medicine

## 2018-03-08 VITALS — BP 124/80 | HR 95 | Temp 98.3°F | Ht 63.0 in | Wt 193.0 lb

## 2018-03-08 DIAGNOSIS — H6522 Chronic serous otitis media, left ear: Secondary | ICD-10-CM | POA: Diagnosis not present

## 2018-03-08 DIAGNOSIS — R7302 Impaired glucose tolerance (oral): Secondary | ICD-10-CM | POA: Diagnosis not present

## 2018-03-08 DIAGNOSIS — Z0001 Encounter for general adult medical examination with abnormal findings: Secondary | ICD-10-CM | POA: Diagnosis not present

## 2018-03-08 DIAGNOSIS — H6520 Chronic serous otitis media, unspecified ear: Secondary | ICD-10-CM | POA: Insufficient documentation

## 2018-03-08 DIAGNOSIS — R0683 Snoring: Secondary | ICD-10-CM

## 2018-03-08 DIAGNOSIS — J309 Allergic rhinitis, unspecified: Secondary | ICD-10-CM | POA: Diagnosis not present

## 2018-03-08 MED ORDER — LOSARTAN POTASSIUM 50 MG PO TABS: 50.0000 mg | ORAL_TABLET | Freq: Every day | ORAL | 1 refills | Status: DC

## 2018-03-08 MED ORDER — ALPRAZOLAM 0.5 MG PO TABS: 0.5000 mg | ORAL_TABLET | Freq: Every evening | ORAL | 4 refills | Status: DC | PRN

## 2018-03-08 NOTE — Progress Notes (Signed)
Subjective:    Patient ID: Crystal [name], female    DOB: 1951/08/01, 67 y.o.   MRN: [id number]  HPI  Here for wellness and f/u;  Overall doing ok;  Pt denies Chest pain, worsening SOB, DOE, wheezing, orthopnea, PND, worsening LE edema, palpitations, dizziness or syncope.  Pt denies neurological change such as new headache, facial or extremity weakness.  Pt denies polydipsia, polyuria, or low sugar symptoms. Pt states overall good compliance with treatment and medications, good tolerability, and has been trying to follow appropriate diet.  Pt denies worsening depressive symptoms, suicidal ideation or panic. No fever, night sweats, wt loss, loss of appetite, or other constitutional symptoms.  Pt states good ability with ADL's, has low fall risk, home safety reviewed and adequate, no other significant changes in hearing or vision, and not active with regular exercise though plans to be more active soon.  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  Also, Does have several wks ongoing nasal allergy symptoms with clearish congestion, itch and sneezing, without fever, pain, ST, cough, swelling or wheezing.   Also unrelated it seems is marked snoring at night and daytime fatigue for months and years, mod now worsening per sister who is on CPAP at night for her own osa Also, c/o persistent left ear pressure without fever, but with reduced hearing.  No vertigo, ST, or sob No other interval hx or new complaint; plans to travel to [place] to stay with sister for 2 mo June 17. Past Medical History:  Diagnosis Date  . ALLERGIC RHINITIS   . Allergy   . ANXIETY    no medations needed  . Arthritis   . Bronchitis   . Cataract    no surgery  . COLONIC POLYPS, HX OF   . COPD    pt unsure of this dx  . Cough   . DEPRESSION   . Fatty liver   . GERD   . GLUCOSE INTOLERANCE   . HYPERTENSION   . IBS   . Impaired glucose tolerance   . OSTEOPENIA   . Osteopenia   .  Osteoporosis 07/30/2017  . PERIPHERAL EDEMA   . Pneumonia, organism unspecified(486)   . TOBACCO USE DISORDER/SMOKER-SMOKING CESSATION DISCUSSED    Past Surgical History:  Procedure Laterality Date  . birthmark removed     from back as a child  . bladder mesh     2012  . CESAREAN SECTION     x2  . CHOLECYSTECTOMY    . COLONOSCOPY    . WISDOM TOOTH EXTRACTION      reports that she quit smoking about 2 years ago. Her smoking use included cigarettes. She has a 17.50 pack-year smoking history. She has never used smokeless tobacco. She reports that she drinks about 12.6 oz of alcohol per week. She reports that she does not use drugs. family history includes Anxiety disorder in her unknown relative; Bladder Cancer in her father; Breast cancer in her sister; Breast cancer (age of onset: 37) in her sister; Cervical cancer in her sister; Colon cancer in her father and paternal grandfather; Diabetes in her unknown relative; Heart attack (age of onset: 85) in her maternal grandmother; Heart attack (age of onset: 1) in her maternal uncle; Hyperlipidemia in her unknown relative; Hypothyroidism in her unknown relative; Melanoma in her paternal grandfather; Other in her mother; Skin cancer in her brother, father, and mother; Thyroid cancer (age of onset: 21) in her paternal grandmother.  Allergies  Allergen Reactions  . Ace Inhibitors Cough  . Penicillins Rash   Current Outpatient Medications on File Prior to Visit  Medication Sig Dispense Refill  . alendronate (FOSAMAX) 70 MG tablet Take 1 tablet (70 mg total) every 7 (seven) days by mouth. Take with a full glass of water on an empty stomach. 12 tablet 2  . aspirin 325 MG tablet Take 325 mg by mouth as needed for mild pain.     . benzonatate (TESSALON) 100 MG capsule Take 100 mg by mouth 3 (three) times daily as needed for cough.    . Calcium Carb-Cholecalciferol (CALCIUM 600+D) 600-800 MG-UNIT TABS     . Calcium Carb-Cholecalciferol [phone] MG-UNIT  CAPS Place 1 tablet onto teeth.    . Cholecalciferol (VITAMIN D PO) Take by mouth daily.    . citalopram (CELEXA) 10 MG tablet Take 1 tablet (10 mg total) by mouth daily. 90 tablet 3  . doxycycline (VIBRA-TABS) 100 MG tablet Take 1 tablet (100 mg total) by mouth 2 (two) times daily. 20 tablet 0  . Fexofenadine HCl (MUCINEX ALLERGY PO) Take 600 mg by mouth as needed.    . folic acid (FOLVITE) 562 MCG tablet Take 400 mcg by mouth daily.    Marland Kitchen loratadine (CLARITIN) 10 MG tablet Take 10 mg by mouth daily as needed.     . triamcinolone (NASACORT) 55 MCG/ACT AERO nasal inhaler Place 2 sprays into the nose daily. 1 Inhaler 12  . TURMERIC PO Take 1,000 mg by mouth daily. Liquid     No current facility-administered medications on file prior to visit.    Review of Systems Constitutional: Negative for other unusual diaphoresis, sweats, appetite or weight changes HENT: Negative for other worsening hearing loss, ear pain, facial swelling, mouth sores or neck stiffness.   Eyes: Negative for other worsening pain, redness or other visual disturbance.  Respiratory: Negative for other stridor or swelling Cardiovascular: Negative for other palpitations or other chest pain  Gastrointestinal: Negative for worsening diarrhea or loose stools, blood in stool, distention or other pain Genitourinary: Negative for hematuria, flank pain or other change in urine volume.  Musculoskeletal: Negative for myalgias or other joint swelling.  Skin: Negative for other color change, or other wound or worsening drainage.  Neurological: Negative for other syncope or numbness. Hematological: Negative for other adenopathy or swelling Psychiatric/Behavioral: Negative for hallucinations, other worsening agitation, SI, self-injury, or new decreased concentration All other system neg per pt    Objective:   Physical Exam BP 124/80   Pulse 95   Temp 98.3 F (36.8 C) (Oral)   Ht 5' 3"  (1.6 m)   Wt 193 lb (87.5 kg)   SpO2 97%   BMI  34.19 kg/m  VS noted,  Constitutional: Pt is oriented to person, place, and time. Appears well-developed and well-nourished, in no significant distress and comfortable Head: Normocephalic and atraumatic  Eyes: Conjunctivae and EOM are normal. Pupils are equal, round, and reactive to light Bilat tm's with mild erythema.  Max sinus areas non tender.  Pharynx with mild erythema, no exudate  Right Ear: External ear normal without discharge Left Ear: External ear normal without discharge, left TM with mod erythema with effusion Nose: Nose without discharge or deformity Mouth/Throat: Oropharynx is without other ulcerations and moist  Neck: Normal range of motion. Neck supple. No JVD present. No tracheal deviation present or significant neck LA or mass Cardiovascular: Normal rate, regular rhythm, normal heart sounds and intact distal pulses.   Pulmonary/Chest:  WOB normal and breath sounds without rales or wheezing  Abdominal: Soft. Bowel sounds are normal. NT. No HSM  Musculoskeletal: Normal range of motion. Exhibits no edema Lymphadenopathy: Has no other cervical adenopathy.  Neurological: Pt is alert and oriented to person, place, and time. Pt has normal reflexes. No cranial nerve deficit. Motor grossly intact, Gait intact Skin: Skin is warm and dry. No rash noted or new ulcerations Psychiatric:  Has normal mood and affect. Behavior is normal without agitation No other exam findings Lab Results  Component Value Date   WBC 6.4 03/05/2018   HGB 15.3 (H) 03/05/2018   HCT 45.0 03/05/2018   PLT 249.0 03/05/2018   GLUCOSE 91 03/05/2018   CHOL 195 03/05/2018   TRIG 45.0 03/05/2018   HDL 98.00 03/05/2018   LDLDIRECT 82.2 07/01/2013   LDLCALC 88 03/05/2018   ALT 10 03/05/2018   AST 15 03/05/2018   NA 140 03/05/2018   K 4.5 03/05/2018   CL 106 03/05/2018   CREATININE 0.62 03/05/2018   BUN 12 03/05/2018   CO2 28 03/05/2018   TSH 1.24 03/05/2018   INR 0.94 04/24/2011   HGBA1C 5.1 03/05/2018       Assessment & Plan:

## 2018-03-08 NOTE — Assessment & Plan Note (Signed)

## 2018-03-08 NOTE — Assessment & Plan Note (Addendum)
Left, mod pain persistent, for ENT referral - ? Need ear tube  In addition to the time spent performing CPE, I spent an additional 25 minutes face to face,in which greater than 50% of this time was spent in counseling and coordination of care for patient's acute illness as documented, including the differential dx, treatment, further evaluation and other management of chronic left ear pain, snoring, hyperglycemia and allergic rhinitis

## 2018-03-08 NOTE — Assessment & Plan Note (Addendum)
For nasacort asd,  to f/u any worsening symptoms or concerns, also for allergy referral per pt reqeust

## 2018-03-08 NOTE — Assessment & Plan Note (Signed)
Lab Results  Component Value Date   HGBA1C 5.1 03/05/2018  stable overall by history and exam, recent data reviewed with pt, and pt to continue medical treatment as before,  to f/u any worsening symptoms or concerns

## 2018-03-08 NOTE — Assessment & Plan Note (Signed)
Also for pulm referra, r/o osa

## 2018-03-08 NOTE — Patient Instructions (Signed)
Please continue all other medications as before, and refills have been done if requested.  Please have the pharmacy call with any other refills you may need.  Please continue your efforts at being more active, low cholesterol diet, and weight control.  You are otherwise up to date with prevention measures today.  Please keep your appointments with your specialists as you may have planned  You will be contacted regarding the referral for: ENT asap, and later pulmonary and allergy  Your lab work was OK today  Please remember to sign up for MyChart if you have not done so, as this will be important to you in the future with finding out test results, communicating by private email, and scheduling acute appointments online when needed.  Have a good time in [place]!  Please return in 1 year for your yearly visit, or sooner if needed, with Lab testing done 3-5 days before

## 2018-03-10 ENCOUNTER — Encounter: Payer: Self-pay | Admitting: Family Medicine

## 2018-03-10 ENCOUNTER — Ambulatory Visit: Payer: Medicare HMO | Admitting: Family Medicine

## 2018-03-10 DIAGNOSIS — H6522 Chronic serous otitis media, left ear: Secondary | ICD-10-CM | POA: Diagnosis not present

## 2018-03-10 DIAGNOSIS — J301 Allergic rhinitis due to pollen: Secondary | ICD-10-CM | POA: Diagnosis not present

## 2018-03-10 DIAGNOSIS — M170 Bilateral primary osteoarthritis of knee: Secondary | ICD-10-CM

## 2018-03-10 NOTE — Patient Instructions (Signed)
Good to see you  Ice in 6 hours.  Stay active.  See me again in 1 week

## 2018-03-10 NOTE — Assessment & Plan Note (Signed)
Failed all conservative therapy.  Starting Visco supplementation.  Discussed icing regimen and home exercises.  Patient will continue topical anti-inflammatories.  Patient will follow-up in 1 week for second in a series of 4 injections.

## 2018-03-10 NOTE — Progress Notes (Signed)
[name] [address] [place], [place] [address] Phone: [phone] Subjective:       CC: Right knee pain.  PYP:[id number]  Crystal [name] is a 67 y.o. female coming in with complaint of knee pain.  Severe arthritis of the knees bilaterally.  Was given steroid injection.  Held for 2 weeks.  Failed all other conservative therapy.  Affecting daily activities and waking her up at night.     Past Medical History:  Diagnosis Date  . ALLERGIC RHINITIS   . Allergy   . ANXIETY    no medations needed  . Arthritis   . Bronchitis   . Cataract    no surgery  . COLONIC POLYPS, HX OF   . COPD    pt unsure of this dx  . Cough   . DEPRESSION   . Fatty liver   . GERD   . GLUCOSE INTOLERANCE   . HYPERTENSION   . IBS   . Impaired glucose tolerance   . OSTEOPENIA   . Osteopenia   . Osteoporosis 07/30/2017  . PERIPHERAL EDEMA   . Pneumonia, organism unspecified(486)   . TOBACCO USE DISORDER/SMOKER-SMOKING CESSATION DISCUSSED    Past Surgical History:  Procedure Laterality Date  . birthmark removed     from back as a child  . bladder mesh     2012  . CESAREAN SECTION     x2  . CHOLECYSTECTOMY    . COLONOSCOPY    . WISDOM TOOTH EXTRACTION     Social History   Socioeconomic History  . Marital status: Widowed    Spouse name: Not on file  . Number of children: 2  . Years of education: Not on file  . Highest education level: Not on file  Occupational History  . Occupation: Occupational psychologist industries, Museum/gallery curator for [employer]  Social Needs  . Financial resource strain: Not on file  . Food insecurity:    Worry: Not on file    Inability: Not on file  . Transportation needs:    Medical: Not on file    Non-medical: Not on file  Tobacco Use  . Smoking status: Former Smoker    Packs/day: 0.50    Years: 35.00    Pack years: 17.50    Types: Cigarettes    Last attempt to quit: 12/13/2015    Years since quitting: 2.2  . Smokeless  tobacco: Never Used  . Tobacco comment: stopped 12-13-15  Substance and Sexual Activity  . Alcohol use: Yes    Alcohol/week: 12.6 oz    Types: 7 Glasses of wine, 14 Standard drinks or equivalent per week  . Drug use: No  . Sexual activity: Not on file  Lifestyle  . Physical activity:    Days per week: Not on file    Minutes per session: Not on file  . Stress: Not on file  Relationships  . Social connections:    Talks on phone: Not on file    Gets together: Not on file    Attends religious service: Not on file    Active member of club or organization: Not on file    Attends meetings of clubs or organizations: Not on file    Relationship status: Not on file  Other Topics Concern  . Not on file  Social History Narrative  . Not on file   Allergies  Allergen Reactions  . Ace Inhibitors Cough  . Penicillins Rash   Family History  Problem Relation Age of Onset  . Breast cancer Sister 40       +lump and tamoxifen; eventually had bilateral mastectomies  . Colon cancer Father        dx. >30  . Bladder Cancer Father        dx. 12-79; not a smoker  . Skin cancer Father        basal cell carcinoma  . Colon cancer Paternal Grandfather        unspecified age  . Melanoma Paternal Grandfather        unspecified age  . Skin cancer Mother        NOS; dx. 15s  . Other Mother        hx of hysterectomy in her early 55s for fibroid cysts  . Heart attack Maternal Uncle 67  . Heart attack Maternal Grandmother 62  . Thyroid cancer Paternal Grandmother 88  . Breast cancer Sister        dx. early 17s; s/p BL mastectomies  . Cervical cancer Sister        dx. late 36s  . Skin cancer Brother        NOS  . Anxiety disorder Unknown   . Hypothyroidism Unknown   . Hyperlipidemia Unknown   . Diabetes Unknown   . Esophageal cancer Neg Hx   . Stomach cancer Neg Hx   . Rectal cancer Neg Hx      Past medical history, social, surgical and family history all reviewed in electronic medical  record.  No pertanent information unless stated regarding to the chief complaint.   Review of Systems:Review of systems updated and as accurate as of 03/10/18  No headache, visual changes, nausea, vomiting, diarrhea, constipation, dizziness, abdominal pain, skin rash, fevers, chills, night sweats, weight loss, swollen lymph nodes, body aches,  chest pain, shortness of breath, mood changes.  Positive muscle aches and joint swelling  Objective  Blood pressure 126/80, pulse (!) 52, height 5' 3"  (1.6 m), weight 190 lb (86.2 kg), SpO2 97 %. Systems examined below as of 03/10/18   General: No apparent distress alert and oriented x3 mood and affect normal, dressed appropriately.  HEENT: Pupils equal, extraocular movements intact  Respiratory: Patient's speak in full sentences and does not appear short of breath  Cardiovascular: No lower extremity edema, non tender, no erythema  Skin: Warm dry intact with no signs of infection or rash on extremities or on axial skeleton.  Abdomen: Soft nontender  Neuro: Cranial nerves II through XII are intact, neurovascularly intact in all extremities with 2+ DTRs and 2+ pulses.  Lymph: No lymphadenopathy of posterior or anterior cervical chain or axillae bilaterally.  Gait  mild antalgic MSK:  Non tender with full range of motion and good stability and symmetric strength and tone of shoulders, elbows, wrist, hip, and ankles bilaterally.  Arthritic changes of multiple joints Knee: Bilateral valgus deformity noted.  Abnormal thigh to calf ratio.  Tender to palpation over medial and PF joint line.  ROM full in flexion and extension and lower leg rotation. instability with valgus force.  painful patellar compression. Patellar glide with moderate crepitus. Patellar and quadriceps tendons unremarkable. Hamstring and quadriceps strength is normal.  After informed written and verbal consent, patient was seated on exam table. Right knee was prepped with alcohol swab  and utilizing anterolateral approach, patient's right knee space was injected with15 mg/2.5 mL of Orthovisc(sodium hyaluronate) in a prefilled syringe was injected easily into the knee through a 22-gauge  needle..Patient tolerated the procedure well without immediate complications.  After informed written and verbal consent, patient was seated on exam table. Left knee was prepped with alcohol swab and utilizing anterolateral approach, patient's left knee space was injected with15 mg/2.5 mL of Orthovisc(sodium hyaluronate) in a prefilled syringe was injected easily into the knee through a 22-gauge needle..Patient tolerated the procedure well without immediate complications.   Impression and Recommendations:     This case required medical decision making of moderate complexity.      Note: This dictation was prepared with Dragon dictation along with smaller phrase technology. Any transcriptional errors that result from this process are unintentional.

## 2018-03-16 NOTE — Progress Notes (Signed)
[name] [address] [place], [place] [address] Phone: [phone] Subjective:      CC: Bilateral knee pain  [id number]  YASENIA REEDY is a 67 y.o. female coming in with complaint of bilateral knee pain.  Known arthritic changes of the knees bilaterally.  Started Visco supplementation after failing all conservative therapy.  Here for second in a series of 3 injections.  Patient states improvement.       Past Medical History:  Diagnosis Date  . ALLERGIC RHINITIS   . Allergy   . ANXIETY    no medations needed  . Arthritis   . Bronchitis   . Cataract    no surgery  . COLONIC POLYPS, HX OF   . COPD    pt unsure of this dx  . Cough   . DEPRESSION   . Fatty liver   . GERD   . GLUCOSE INTOLERANCE   . HYPERTENSION   . IBS   . Impaired glucose tolerance   . OSTEOPENIA   . Osteopenia   . Osteoporosis 07/30/2017  . PERIPHERAL EDEMA   . Pneumonia, organism unspecified(486)   . TOBACCO USE DISORDER/SMOKER-SMOKING CESSATION DISCUSSED    Past Surgical History:  Procedure Laterality Date  . birthmark removed     from back as a child  . bladder mesh     2012  . CESAREAN SECTION     x2  . CHOLECYSTECTOMY    . COLONOSCOPY    . WISDOM TOOTH EXTRACTION     Social History   Socioeconomic History  . Marital status: Widowed    Spouse name: Not on file  . Number of children: 2  . Years of education: Not on file  . Highest education level: Not on file  Occupational History  . Occupation: Occupational psychologist industries, Museum/gallery curator for [employer]  Social Needs  . Financial resource strain: Not on file  . Food insecurity:    Worry: Not on file    Inability: Not on file  . Transportation needs:    Medical: Not on file    Non-medical: Not on file  Tobacco Use  . Smoking status: Former Smoker    Packs/day: 0.50    Years: 35.00    Pack years: 17.50    Types: Cigarettes    Last attempt to quit: 12/13/2015    Years since  quitting: 2.2  . Smokeless tobacco: Never Used  . Tobacco comment: stopped 12-13-15  Substance and Sexual Activity  . Alcohol use: Yes    Alcohol/week: 12.6 oz    Types: 7 Glasses of wine, 14 Standard drinks or equivalent per week  . Drug use: No  . Sexual activity: Not on file  Lifestyle  . Physical activity:    Days per week: Not on file    Minutes per session: Not on file  . Stress: Not on file  Relationships  . Social connections:    Talks on phone: Not on file    Gets together: Not on file    Attends religious service: Not on file    Active member of club or organization: Not on file    Attends meetings of clubs or organizations: Not on file    Relationship status: Not on file  Other Topics Concern  . Not on file  Social History Narrative  . Not on file   Allergies  Allergen Reactions  . Ace Inhibitors Cough  . Penicillins Rash   Family History  Problem Relation Age of Onset  . Breast cancer Sister 40       +lump and tamoxifen; eventually had bilateral mastectomies  . Colon cancer Father        dx. >36  . Bladder Cancer Father        dx. 56-79; not a smoker  . Skin cancer Father        basal cell carcinoma  . Colon cancer Paternal Grandfather        unspecified age  . Melanoma Paternal Grandfather        unspecified age  . Skin cancer Mother        NOS; dx. 46s  . Other Mother        hx of hysterectomy in her early 66s for fibroid cysts  . Heart attack Maternal Uncle 67  . Heart attack Maternal Grandmother 62  . Thyroid cancer Paternal Grandmother 88  . Breast cancer Sister        dx. early 2s; s/p BL mastectomies  . Cervical cancer Sister        dx. late 79s  . Skin cancer Brother        NOS  . Anxiety disorder Unknown   . Hypothyroidism Unknown   . Hyperlipidemia Unknown   . Diabetes Unknown   . Esophageal cancer Neg Hx   . Stomach cancer Neg Hx   . Rectal cancer Neg Hx      Past medical history, social, surgical and family history all  reviewed in electronic medical record.  No pertanent information unless stated regarding to the chief complaint.   Review of Systems:Review of systems updated and as accurate as of 03/17/18  No headache, visual changes, nausea, vomiting, diarrhea, constipation, dizziness, abdominal pain, skin rash, fevers, chills, night sweats, weight loss, swollen lymph nodes, body aches, joint swelling, muscle aches, chest pain, shortness of breath, mood changes.   Objective  Blood pressure (!) 150/90, pulse 90, height 5' 3"  (1.6 m), weight 193 lb (87.5 kg), SpO2 98 %. Systems examined below as of 03/17/18   General: No apparent distress alert and oriented x3 mood and affect normal, dressed appropriately.  HEENT: Pupils equal, extraocular movements intact  Respiratory: Patient's speak in full sentences and does not appear short of breath  Cardiovascular: No lower extremity edema, non tender, no erythema  Skin: Warm dry intact with no signs of infection or rash on extremities or on axial skeleton.  Abdomen: Soft nontender  Neuro: Cranial nerves II through XII are intact, neurovascularly intact in all extremities with 2+ DTRs and 2+ pulses.  Lymph: No lymphadenopathy of posterior or anterior cervical chain or axillae bilaterally.  Gait antalgic MSK:  Non tender with full range of motion and good stability and symmetric strength and tone of shoulders, elbows, wrist, hip, and ankles bilaterally.  Knee: Bilateral valgus deformity noted. Large thigh to calf ratio.  Tender to palpation over medial and PF joint line.  ROM full in flexion and extension and lower leg rotation. instability with valgus force.  painful patellar compression. Patellar glide with moderate crepitus. Patellar and quadriceps tendons unremarkable. Hamstring and quadriceps strength is normal.   After informed written and verbal consent, patient was seated on exam table. Right knee was prepped with alcohol swab and utilizing anterolateral  approach, patient's right knee space was injected with15 mg/2.5 mL of Orthovisc(sodium hyaluronate) in a prefilled syringe was injected easily into the knee through a 22-gauge needle..Patient tolerated the procedure well without immediate complications.  After  informed written and verbal consent, patient was seated on exam table. Left knee was prepped with alcohol swab and utilizing anterolateral approach, patient's left knee space was injected with15 mg/2.5 mL of Orthovisc(sodium hyaluronate) in a prefilled syringe was injected easily into the knee through a 22-gauge needle..Patient tolerated the procedure well without immediate complications.   Impression and Recommendations:     This case required medical decision making of moderate complexity.      Note: This dictation was prepared with Dragon dictation along with smaller phrase technology. Any transcriptional errors that result from this process are unintentional.

## 2018-03-17 ENCOUNTER — Encounter: Payer: Self-pay | Admitting: Family Medicine

## 2018-03-17 ENCOUNTER — Ambulatory Visit: Payer: Medicare HMO | Admitting: Family Medicine

## 2018-03-17 DIAGNOSIS — M170 Bilateral primary osteoarthritis of knee: Secondary | ICD-10-CM | POA: Diagnosis not present

## 2018-03-17 NOTE — Assessment & Plan Note (Signed)
Injections #2 bilateral in a series of 4 injections for Visco supplementation.  Continue conservative therapy.  Follow-up in 1 week for third in a series of 4 injections

## 2018-03-17 NOTE — Patient Instructions (Signed)
2 down and 2 to go.  See you next week!

## 2018-03-22 DIAGNOSIS — J410 Simple chronic bronchitis: Secondary | ICD-10-CM | POA: Diagnosis not present

## 2018-03-22 DIAGNOSIS — H6523 Chronic serous otitis media, bilateral: Secondary | ICD-10-CM | POA: Diagnosis not present

## 2018-03-23 NOTE — Progress Notes (Signed)
[name] [address] [place], [place] [address] Phone: [phone] Subjective:     CC: Bilateral knee pain  EVO:[id number]  Crystal [name] is a 67 y.o. female coming in with complaint of bilateral knee pain.  Severe arthritis.  Here for third in a series of 4 injections for Visco supplementation.  Patient states she is noticing improvement     Past Medical History:  Diagnosis Date  . ALLERGIC RHINITIS   . Allergy   . ANXIETY    no medations needed  . Arthritis   . Bronchitis   . Cataract    no surgery  . COLONIC POLYPS, HX OF   . COPD    pt unsure of this dx  . Cough   . DEPRESSION   . Fatty liver   . GERD   . GLUCOSE INTOLERANCE   . HYPERTENSION   . IBS   . Impaired glucose tolerance   . OSTEOPENIA   . Osteopenia   . Osteoporosis 07/30/2017  . PERIPHERAL EDEMA   . Pneumonia, organism unspecified(486)   . TOBACCO USE DISORDER/SMOKER-SMOKING CESSATION DISCUSSED    Past Surgical History:  Procedure Laterality Date  . birthmark removed     from back as a child  . bladder mesh     2012  . CESAREAN SECTION     x2  . CHOLECYSTECTOMY    . COLONOSCOPY    . WISDOM TOOTH EXTRACTION     Social History   Socioeconomic History  . Marital status: Widowed    Spouse name: Not on file  . Number of children: 2  . Years of education: Not on file  . Highest education level: Not on file  Occupational History  . Occupation: Occupational psychologist industries, Museum/gallery curator for [employer]  Social Needs  . Financial resource strain: Not on file  . Food insecurity:    Worry: Not on file    Inability: Not on file  . Transportation needs:    Medical: Not on file    Non-medical: Not on file  Tobacco Use  . Smoking status: Former Smoker    Packs/day: 0.50    Years: 35.00    Pack years: 17.50    Types: Cigarettes    Last attempt to quit: 12/13/2015    Years since quitting: 2.2  . Smokeless tobacco: Never Used  . Tobacco comment:  stopped 12-13-15  Substance and Sexual Activity  . Alcohol use: Yes    Alcohol/week: 12.6 oz    Types: 7 Glasses of wine, 14 Standard drinks or equivalent per week  . Drug use: No  . Sexual activity: Not on file  Lifestyle  . Physical activity:    Days per week: Not on file    Minutes per session: Not on file  . Stress: Not on file  Relationships  . Social connections:    Talks on phone: Not on file    Gets together: Not on file    Attends religious service: Not on file    Active member of club or organization: Not on file    Attends meetings of clubs or organizations: Not on file    Relationship status: Not on file  Other Topics Concern  . Not on file  Social History Narrative  . Not on file   Allergies  Allergen Reactions  . Ace Inhibitors Cough  . Penicillins Rash   Family History  Problem Relation Age of Onset  . Breast cancer Sister  40       +lump and tamoxifen; eventually had bilateral mastectomies  . Colon cancer Father        dx. >33  . Bladder Cancer Father        dx. 37-79; not a smoker  . Skin cancer Father        basal cell carcinoma  . Colon cancer Paternal Grandfather        unspecified age  . Melanoma Paternal Grandfather        unspecified age  . Skin cancer Mother        NOS; dx. 25s  . Other Mother        hx of hysterectomy in her early 29s for fibroid cysts  . Heart attack Maternal Uncle 67  . Heart attack Maternal Grandmother 62  . Thyroid cancer Paternal Grandmother 88  . Breast cancer Sister        dx. early 52s; s/p BL mastectomies  . Cervical cancer Sister        dx. late 86s  . Skin cancer Brother        NOS  . Anxiety disorder Unknown   . Hypothyroidism Unknown   . Hyperlipidemia Unknown   . Diabetes Unknown   . Esophageal cancer Neg Hx   . Stomach cancer Neg Hx   . Rectal cancer Neg Hx      Past medical history, social, surgical and family history all reviewed in electronic medical record.  No pertanent information unless  stated regarding to the chief complaint.   Review of Systems:Review of systems updated and as accurate as of 03/24/18  No headache, visual changes, nausea, vomiting, diarrhea, constipation, dizziness, abdominal pain, skin rash, fevers, chills, night sweats, weight loss, swollen lymph nodes, body aches, , chest pain, shortness of breath, mood changes.  Positive muscle aches mild joint swelling  Objective  Blood pressure (!) 150/86, pulse 86, height 5' 3"  (1.6 m), weight 190 lb (86.2 kg), SpO2 98 %. Systems examined below as of 03/24/18   General: No apparent distress alert and oriented x3 mood and affect normal, dressed appropriately.  Gait antalgic MSK:  Non tender with full range of motion and good stability and symmetric strength and tone of shoulders, elbows, wrist, hip, and ankles bilaterally.  Arthritic changes of multiple joints  Knee: Bilateral valgus deformity noted. Large thigh to calf ratio.  Tender to palpation over medial and PF joint line.  ROM full in flexion and extension and lower leg rotation. instability with valgus force.  painful patellar compression. Patellar glide with moderate crepitus. Patellar and quadriceps tendons unremarkable. Hamstring and quadriceps strength is normal.  After informed written and verbal consent, patient was seated on exam table. Right knee was prepped with alcohol swab and utilizing anterolateral approach, patient's right knee space was injected with15 mg/2.5 mL of Orthovisc(sodium hyaluronate) in a prefilled syringe was injected easily into the knee through a 22-gauge needle..Patient tolerated the procedure well without immediate complications.  After informed written and verbal consent, patient was seated on exam table. Left knee was prepped with alcohol swab and utilizing anterolateral approach, patient's left knee space was injected with15 mg/2.5 mL of Orthovisc(sodium hyaluronate) in a prefilled syringe was injected easily into the knee  through a 22-gauge needle..Patient tolerated the procedure well without immediate complications.    Impression and Recommendations:     This case required medical decision making of moderate complexity.      Note: This dictation was prepared with Dragon dictation along  with smaller phrase technology. Any transcriptional errors that result from this process are unintentional.

## 2018-03-24 ENCOUNTER — Encounter: Payer: Self-pay | Admitting: Family Medicine

## 2018-03-24 ENCOUNTER — Ambulatory Visit: Payer: Medicare HMO | Admitting: Family Medicine

## 2018-03-24 DIAGNOSIS — M170 Bilateral primary osteoarthritis of knee: Secondary | ICD-10-CM | POA: Diagnosis not present

## 2018-03-24 NOTE — Patient Instructions (Signed)
3 down and one to go  You know the drill  Stay active Ice is your friend See me again in 1 week for the finale!

## 2018-03-24 NOTE — Assessment & Plan Note (Signed)
Bilateral injections given.  Tolerated procedure well.  We discussed icing regimen and home exercises.  Discussed which activities of doing which was to avoid.  Patient is to increase activity slowly.  Follow-up in 1 week for fourth and final injection

## 2018-03-26 NOTE — Telephone Encounter (Signed)
error 

## 2018-03-31 ENCOUNTER — Ambulatory Visit: Payer: Medicare HMO | Admitting: Family Medicine

## 2018-03-31 ENCOUNTER — Encounter: Payer: Self-pay | Admitting: Family Medicine

## 2018-03-31 ENCOUNTER — Telehealth: Payer: Self-pay

## 2018-03-31 DIAGNOSIS — M170 Bilateral primary osteoarthritis of knee: Secondary | ICD-10-CM

## 2018-03-31 NOTE — Assessment & Plan Note (Signed)
Finished Visco supplementation today.  Continue all other conservative therapy.  We discussed icing regimen and home exercises.  We discussed which activities of doing which wants to avoid.  Follow-up again as needed

## 2018-03-31 NOTE — Patient Instructions (Signed)
Good to see you  You should do great  Get a break from me Can repeat these injection in 6 months if needed Have a good summer

## 2018-03-31 NOTE — Telephone Encounter (Signed)
Pt called back please call back  °

## 2018-03-31 NOTE — Telephone Encounter (Signed)
Left message asking patient to call back and ask for Crystal [name], need to get proof of payment for prolia from patient so that [employer] found reimbursement can be claimed

## 2018-03-31 NOTE — Progress Notes (Signed)
[name] [address] [place], [name] [address] Phone: [phone] Subjective:     CC: Knee pain  QTM:[id number]  Crystal [name] is a 67 y.o. female coming in with complaint of knee pain. She said that she is feeling great. She did have some swelling last night but that has subsided today. Patient notes that she was on her feet a lot preparing for a trip.      Past Medical History:  Diagnosis Date  . ALLERGIC RHINITIS   . Allergy   . ANXIETY    no medations needed  . Arthritis   . Bronchitis   . Cataract    no surgery  . COLONIC POLYPS, HX OF   . COPD    pt unsure of this dx  . Cough   . DEPRESSION   . Fatty liver   . GERD   . GLUCOSE INTOLERANCE   . HYPERTENSION   . IBS   . Impaired glucose tolerance   . OSTEOPENIA   . Osteopenia   . Osteoporosis 07/30/2017  . PERIPHERAL EDEMA   . Pneumonia, organism unspecified(486)   . TOBACCO USE DISORDER/SMOKER-SMOKING CESSATION DISCUSSED    Past Surgical History:  Procedure Laterality Date  . birthmark removed     from back as a child  . bladder mesh     2012  . CESAREAN SECTION     x2  . CHOLECYSTECTOMY    . COLONOSCOPY    . WISDOM TOOTH EXTRACTION     Social History   Socioeconomic History  . Marital status: Widowed    Spouse name: Not on file  . Number of children: 2  . Years of education: Not on file  . Highest education level: Not on file  Occupational History  . Occupation: Occupational psychologist industries, Museum/gallery curator for [employer]  Social Needs  . Financial resource strain: Not on file  . Food insecurity:    Worry: Not on file    Inability: Not on file  . Transportation needs:    Medical: Not on file    Non-medical: Not on file  Tobacco Use  . Smoking status: Former Smoker    Packs/day: 0.50    Years: 35.00    Pack years: 17.50    Types: Cigarettes    Last attempt to quit: 12/13/2015    Years since quitting: 2.2  . Smokeless tobacco: Never Used  .  Tobacco comment: stopped 12-13-15  Substance and Sexual Activity  . Alcohol use: Yes    Alcohol/week: 12.6 oz    Types: 7 Glasses of wine, 14 Standard drinks or equivalent per week  . Drug use: No  . Sexual activity: Not on file  Lifestyle  . Physical activity:    Days per week: Not on file    Minutes per session: Not on file  . Stress: Not on file  Relationships  . Social connections:    Talks on phone: Not on file    Gets together: Not on file    Attends religious service: Not on file    Active member of club or organization: Not on file    Attends meetings of clubs or organizations: Not on file    Relationship status: Not on file  Other Topics Concern  . Not on file  Social History Narrative  . Not on file   Allergies  Allergen Reactions  . Ace Inhibitors Cough  . Penicillins Rash   Family History  Problem  Relation Age of Onset  . Breast cancer Sister 40       +lump and tamoxifen; eventually had bilateral mastectomies  . Colon cancer Father        dx. >32  . Bladder Cancer Father        dx. 90-79; not a smoker  . Skin cancer Father        basal cell carcinoma  . Colon cancer Paternal Grandfather        unspecified age  . Melanoma Paternal Grandfather        unspecified age  . Skin cancer Mother        NOS; dx. 47s  . Other Mother        hx of hysterectomy in her early 64s for fibroid cysts  . Heart attack Maternal Uncle 67  . Heart attack Maternal Grandmother 62  . Thyroid cancer Paternal Grandmother 88  . Breast cancer Sister        dx. early 85s; s/p BL mastectomies  . Cervical cancer Sister        dx. late 57s  . Skin cancer Brother        NOS  . Anxiety disorder Unknown   . Hypothyroidism Unknown   . Hyperlipidemia Unknown   . Diabetes Unknown   . Esophageal cancer Neg Hx   . Stomach cancer Neg Hx   . Rectal cancer Neg Hx      Past medical history, social, surgical and family history all reviewed in electronic medical record.  No pertanent  information unless stated regarding to the chief complaint.   Review of Systems:Review of systems updated and as accurate as of 03/31/18  No headache, visual changes, nausea, vomiting, diarrhea, constipation, dizziness, abdominal pain, skin rash, fevers, chills, night sweats, weight loss, swollen lymph nodes, body aches, joint swelling,  chest pain, shortness of breath, mood changes.  Positive muscle aches  Objective  Blood pressure (!) 142/98, pulse 92, height 5' 3"  (1.6 m), weight 191 lb (86.6 kg), SpO2 98 %. Systems examined below as of 03/31/18   General: No apparent distress alert and oriented x3 mood and affect normal, dressed appropriately.  HEENT: Pupils equal, extraocular movements intact  Respiratory: Patient's speak in full sentences and does not appear short of breath  Cardiovascular: No lower extremity edema, non tender, no erythema  Skin: Warm dry intact with no signs of infection or rash on extremities or on axial skeleton.  Abdomen: Soft nontender  Neuro: Cranial nerves II through XII are intact, neurovascularly intact in all extremities with 2+ DTRs and 2+ pulses.  Lymph: No lymphadenopathy of posterior or anterior cervical chain or axillae bilaterally.  Gait normal with good balance and coordination.  MSK:  Non tender with full range of motion and good stability and symmetric strength and tone of shoulders, elbows, wrist, hip, and ankles bilaterally.   Knee: Bilateral valgus deformity noted. Large thigh to calf ratio.  Tender to palpation over medial and PF joint line.  ROM full in flexion and extension and lower leg rotation. instability with valgus force.  painful patellar compression. Patellar glide with moderate crepitus. Patellar and quadriceps tendons unremarkable. Hamstring and quadriceps strength is normal.  After informed written and verbal consent, patient was seated on exam table. Right knee was prepped with alcohol swab and utilizing anterolateral approach,  patient's right knee space was injected with15 mg/2.5 mL of Orthovisc(sodium hyaluronate) in a prefilled syringe was injected easily into the knee through a 22-gauge needle..Patient tolerated the  procedure well without immediate complications.  After informed written and verbal consent, patient was seated on exam table. Left knee was prepped with alcohol swab and utilizing anterolateral approach, patient's left knee space was injected with15 mg/2.5 mL of Orthovisc(sodium hyaluronate) in a prefilled syringe was injected easily into the knee through a 22-gauge needle..Patient tolerated the procedure well without immediate complications.    Impression and Recommendations:     This case required medical decision making of moderate complexity.      Note: This dictation was prepared with Dragon dictation along with smaller phrase technology. Any transcriptional errors that result from this process are unintentional.

## 2018-03-31 NOTE — Telephone Encounter (Signed)
Patient to bring copy of receipt where prolia copay was paid on day of her injection---please give receipt to tamara,RN at [facility] office, I will forward information to Mu[facility] so that patient can obtain reimbursement

## 2018-06-10 ENCOUNTER — Other Ambulatory Visit: Payer: Self-pay | Admitting: Internal Medicine

## 2018-06-24 ENCOUNTER — Ambulatory Visit: Payer: Medicare HMO | Admitting: Internal Medicine

## 2018-06-24 ENCOUNTER — Encounter: Payer: Self-pay | Admitting: Internal Medicine

## 2018-06-24 VITALS — BP 126/74 | HR 95 | Ht 63.0 in | Wt 192.2 lb

## 2018-06-24 DIAGNOSIS — F17200 Nicotine dependence, unspecified, uncomplicated: Secondary | ICD-10-CM

## 2018-06-24 DIAGNOSIS — G4733 Obstructive sleep apnea (adult) (pediatric): Secondary | ICD-10-CM | POA: Diagnosis not present

## 2018-06-24 NOTE — Patient Instructions (Signed)
Order- please schedule unattended home sleep test    Dx OSA   Please call me for results and recommendations, about 2 weeks after your sleep test. If appropriate, we may be able to start treatment before I see you next.

## 2018-06-24 NOTE — Progress Notes (Signed)
06/24/2018-67 year old female  smoker for sleep evaluation. Sleep Consult: Dr Cathlean Cower. Pt had sleep study about 12-13 years ago-was not able to complete test due to hook ups,etc. Never put on CPAP. Medical problem list includes allergic rhinitis, anxiety, COPD, HBP, GERD, Epworth score 10 Widowed. Visited daughter who told her she snores loudly.  Some coughing at night.  Sleeps with a pillow under her knees for comfort.  Tosses and turns at night.  Takes Ambien 0.5 mg, 2 cups of coffee in the morning only.  Some days aware of daytime sleepiness.  Naps help. Denies ENT surgery. Currently smoking 1 pack/day.  Occasional episode of bronchitis every 3 or 4 years but otherwise denies lung disease.  Denies heart disease. Declines flu shot. Epworth score 10  Prior to Admission medications   Medication Sig Start Date End Date Taking? Authorizing Provider  alendronate (FOSAMAX) 70 MG tablet Take 1 tablet (70 mg total) every 7 (seven) days by mouth. Take with a full glass of water on an empty stomach. 09/04/17  Yes Biagio Borg, MD  ALPRAZolam Duanne Moron) 0.5 MG tablet Take 1-2 tablets (0.5-1 mg total) by mouth at bedtime as needed. 03/08/18  Yes Biagio Borg, MD  aspirin 325 MG tablet Take 325 mg by mouth as needed for mild pain.    Yes [provider]  benzonatate (TESSALON) 100 MG capsule Take 100 mg by mouth 3 (three) times daily as needed for cough.   Yes [provider]  Calcium Carb-Cholecalciferol (CALCIUM 600+D) 600-800 MG-UNIT TABS  10/20/09  Yes [provider]  Calcium Carb-Cholecalciferol [phone] MG-UNIT CAPS Place 1 tablet onto teeth.   Yes [provider]  Cholecalciferol (VITAMIN D PO) Take by mouth daily.   Yes [provider]  citalopram (CELEXA) 10 MG tablet Take 1 tablet (10 mg total) by mouth daily. 03/11/17  Yes Biagio Borg, MD  Fexofenadine HCl [facility] ALLERGY PO) Take 600 mg by mouth as needed.   Yes [provider]  folic acid  (FOLVITE) 485 MCG tablet Take 400 mcg by mouth daily.   Yes [provider]  loratadine (CLARITIN) 10 MG tablet Take 10 mg by mouth daily as needed.    Yes [provider]  losartan (COZAAR) 50 MG tablet Take 1 tablet (50 mg total) by mouth daily. 03/08/18  Yes Biagio Borg, MD  triamcinolone (NASACORT) 55 MCG/ACT AERO nasal inhaler Place 2 sprays into the nose daily. 02/01/18  Yes Biagio Borg, MD  TURMERIC PO Take 1,000 mg by mouth daily. Liquid   Yes [provider]   Past Medical History:  Diagnosis Date  . ALLERGIC RHINITIS   . Allergy   . ANXIETY    no medations needed  . Arthritis   . Bronchitis   . Cataract    no surgery  . COLONIC POLYPS, HX OF   . COPD    pt unsure of this dx  . Cough   . DEPRESSION   . Fatty liver   . GERD   . GLUCOSE INTOLERANCE   . HYPERTENSION   . IBS   . Impaired glucose tolerance   . OSTEOPENIA   . Osteopenia   . Osteoporosis 07/30/2017  . PERIPHERAL EDEMA   . Pneumonia, organism unspecified(486)   . TOBACCO USE DISORDER/SMOKER-SMOKING CESSATION DISCUSSED    Past Surgical History:  Procedure Laterality Date  . birthmark removed     from back as a child  . bladder mesh  2012  . CESAREAN SECTION     x2  . CHOLECYSTECTOMY    . COLONOSCOPY    . WISDOM TOOTH EXTRACTION     Family History  Problem Relation Age of Onset  . Breast cancer Sister 40       +lump and tamoxifen; eventually had bilateral mastectomies  . Colon cancer Father        dx. >57  . Bladder Cancer Father        dx. 36-79; not a smoker  . Skin cancer Father        basal cell carcinoma  . Colon cancer Paternal Grandfather        unspecified age  . Melanoma Paternal Grandfather        unspecified age  . Skin cancer Mother        NOS; dx. 80s  . Other Mother        hx of hysterectomy in her early 84s for fibroid cysts  . Heart attack Maternal Uncle 67  . Heart attack Maternal Grandmother 62  . Thyroid cancer Paternal Grandmother 88   . Breast cancer Sister        dx. early 40s; s/p BL mastectomies  . Cervical cancer Sister        dx. late 54s  . Skin cancer Brother        NOS  . Anxiety disorder Unknown   . Hypothyroidism Unknown   . Hyperlipidemia Unknown   . Diabetes Unknown   . Esophageal cancer Neg Hx   . Stomach cancer Neg Hx   . Rectal cancer Neg Hx    Social History   Socioeconomic History  . Marital status: Widowed    Spouse name: Not on file  . Number of children: 2  . Years of education: Not on file  . Highest education level: Not on file  Occupational History  . Occupation: Occupational psychologist industries, Museum/gallery curator for [employer]  Social Needs  . Financial resource strain: Not on file  . Food insecurity:    Worry: Not on file    Inability: Not on file  . Transportation needs:    Medical: Not on file    Non-medical: Not on file  Tobacco Use  . Smoking status: Former Smoker    Packs/day: 0.50    Years: 35.00    Pack years: 17.50    Types: Cigarettes    Last attempt to quit: 12/13/2015    Years since quitting: 2.5  . Smokeless tobacco: Never Used  . Tobacco comment: stopped 12-13-15  Substance and Sexual Activity  . Alcohol use: Yes    Alcohol/week: 21.0 standard drinks    Types: 7 Glasses of wine, 14 Standard drinks or equivalent per week  . Drug use: No  . Sexual activity: Not on file  Lifestyle  . Physical activity:    Days per week: Not on file    Minutes per session: Not on file  . Stress: Not on file  Relationships  . Social connections:    Talks on phone: Not on file    Gets together: Not on file    Attends religious service: Not on file    Active member of club or organization: Not on file    Attends meetings of clubs or organizations: Not on file    Relationship status: Not on file  . Intimate partner violence:    Fear of current or ex partner: Not on file    Emotionally abused: Not on  file    Physically abused: Not on file    Forced sexual activity: Not on  file  Other Topics Concern  . Not on file  Social History Narrative  . Not on file   ROS-see HPI   + = positive Constitutional:    weight loss, night sweats, fevers, chills, +fatigue, lassitude. HEENT:    headaches, difficulty swallowing, tooth/dental problems, sore throat,       sneezing, itching, ear ache, nasal congestion, post nasal drip, snoring CV:    chest pain, orthopnea, PND, swelling in lower extremities, anasarca,                                  dizziness, palpitations Resp:   shortness of breath with exertion or at rest.                productive cough,   non-productive cough, coughing up of blood.              change in color of mucus.  wheezing.   Skin:    rash or lesions. GI:  No-   heartburn, indigestion, abdominal pain, nausea, vomiting, diarrhea,                 change in bowel habits, loss of appetite GU: dysuria, change in color of urine, no urgency or frequency.   flank pain. MS:   joint pain, stiffness, decreased range of motion, back pain. Neuro-     nothing unusual Psych:  change in mood or affect.  depression or anxiety.   memory loss.  OBJ- Physical Exam General- Alert, Oriented, Affect-appropriate, Distress- none acute, + overweight Skin- rash-none, lesions- none, excoriation- none Lymphadenopathy- none Head- atraumatic            Eyes- Gross vision intact, PERRLA, conjunctivae and secretions clear            Ears- Hearing, canals-normal            Nose- Clear, no-Septal dev, mucus, polyps, erosion, perforation             Throat- Mallampati II-III , mucosa clear , drainage- none, tonsils- atrophic Neck- flexible , trachea midline, no stridor , thyroid nl, carotid no bruit Chest - symmetrical excursion , unlabored           Heart/CV- RRR , no murmur , no gallop  , no rub, nl s1 s2                           - JVD- none , edema- none, stasis changes- none, varices- none           Lung- clear to P&A, wheeze- none, cough- none , dullness-none, rub- none            Chest wall-  Abd-  Br/ Gen/ Rectal- Not done, not indicated Extrem- cyanosis- none, clubbing, none, atrophy- none, strength- nl Neuro- grossly intact to observation

## 2018-06-25 ENCOUNTER — Encounter: Payer: Self-pay | Admitting: Internal Medicine

## 2018-06-25 DIAGNOSIS — G4733 Obstructive sleep apnea (adult) (pediatric): Secondary | ICD-10-CM | POA: Insufficient documentation

## 2018-06-25 HISTORY — DX: Obstructive sleep apnea (adult) (pediatric): G47.33

## 2018-06-25 NOTE — Assessment & Plan Note (Signed)
Severity is unclear.  She minimizes symptoms but is an active smoker.  Once we get her sleep disordered breathing issues clarified she will probably need a PFT.

## 2018-06-25 NOTE — Assessment & Plan Note (Signed)
Importance of cigarette smoking emphasized and encouraged.

## 2018-06-25 NOTE — Assessment & Plan Note (Signed)
She did not tolerate trying to wear monitor leads for formal sleep study years ago but we think she will do okay with a home sleep test now. Plan-schedule sleep study, anticipating CPAP would be first choice, but oral appliance might be acceptable.

## 2018-06-28 ENCOUNTER — Encounter: Payer: Self-pay | Admitting: Allergy and Immunology

## 2018-06-28 ENCOUNTER — Ambulatory Visit: Payer: Medicare HMO | Admitting: Allergy and Immunology

## 2018-06-28 VITALS — BP 124/74 | HR 90 | Temp 98.0°F | Resp 17 | Ht 63.0 in | Wt 191.4 lb

## 2018-06-28 DIAGNOSIS — J449 Chronic obstructive pulmonary disease, unspecified: Secondary | ICD-10-CM | POA: Diagnosis not present

## 2018-06-28 DIAGNOSIS — J3089 Other allergic rhinitis: Secondary | ICD-10-CM

## 2018-06-28 DIAGNOSIS — J411 Mucopurulent chronic bronchitis: Secondary | ICD-10-CM | POA: Diagnosis not present

## 2018-06-28 MED ORDER — MOMETASONE FUROATE 50 MCG/ACT NA SUSP: 2.0000 | Freq: Every day | NASAL | 5 refills | Status: DC

## 2018-06-28 MED ORDER — ALBUTEROL SULFATE HFA 108 (90 BASE) MCG/ACT IN AERS: 1.0000 | INHALATION_SPRAY | Freq: Four times a day (QID) | RESPIRATORY_TRACT | 1 refills | Status: DC | PRN

## 2018-06-28 NOTE — Patient Instructions (Addendum)
Other allergic rhinitis We were unable to perform skin tests today due to recent administration of antihistamine.   Given her aversion to the taste of Flonase, a prescription has been provided for mometasone nasal spray, 2 sprays per nostril daily as needed.  Nasal saline lavage (NeilMed) has been recommended as needed and prior to medicated nasal sprays along with instructions for proper administration.  For thick post nasal drainage, add guaifenesin [phone] mg (Mucinex)  twice daily as needed with adequate hydration as discussed.  The patient is scheduled to return in the near future for allergy skin testing after having been off of antihistamines for at least 3 days.  Further recommendations will be made at that time based upon skin test results.  Bronchitis, recurrent ([name])  A prescription has been provided for albuterol HFA, 1 to 2 inhalations every 4-6 hours if needed, and 15 minutes prior to exercise.  Subjective and objective measures of pulmonary function will be followed and the treatment plan will be adjusted accordingly.  COPD mixed type (Crystal [name])  Tobacco cessation has been discussed and encouraged.  Albuterol as needed.   Return for allergy skin testing after having been off of antihistamines for at least 3 days.

## 2018-06-28 NOTE — Progress Notes (Signed)
New Patient Note  RE: SUEANNE MANIACI MRN: [id number] DOB: [birth year] Date of Office Visit: 06/28/2018  Referring provider: Biagio Borg, MD Primary care provider: Biagio Borg, MD  Chief Complaint: Allergic Rhinitis  and Cough   History of present illness: Crystal [name] is a 68 y.o. female seen today in consultation requested by Cathlean Cower, MD.  She complains of nasal congestion, sinus pressure, ear pressure, thick postnasal drainage, rhinorrhea "like a faucet", and occasional ocular pruritus.  These symptoms are most frequent and severe during the springtime and in the fall.  She reports that she required 2 rounds of antibiotics this spring for sinus infection.  She takes loratadine daily in an attempt to control the symptoms and will take fluticasone nasal spray on occasion, if her "ears start clicking", however states that the flowery taste of this medication makes her feel nauseated.  She admits that she forgot to discontinue the loratadine in anticipation of today's skin testing and took a dose approximately 1 hour ago.  She was skin tested approximately 35 years ago and took immunotherapy injections, however she does not recall how long she was on immunotherapy. She states that every few years during the fall she develops acute bronchitis with "violent coughing", wheezing, and dyspnea.  She also states that she occasionally wakes up at night coughing and experiences dyspnea with moderate exertion/exercise, such as water aerobics.  Assessment and plan: Other allergic rhinitis We were unable to perform skin tests today due to recent administration of antihistamine.   Given her aversion to the taste of Flonase, a prescription has been provided for mometasone nasal spray, 2 sprays per nostril daily as needed.  Nasal saline lavage (NeilMed) has been recommended as needed and prior to medicated nasal sprays along with instructions for proper administration.  For thick post nasal  drainage, add guaifenesin [phone] mg (Mucinex)  twice daily as needed with adequate hydration as discussed.  The patient is scheduled to return in the near future for allergy skin testing after having been off of antihistamines for at least 3 days.  Further recommendations will be made at that time based upon skin test results.  Bronchitis, recurrent ([place])  A prescription has been provided for albuterol HFA, 1 to 2 inhalations every 4-6 hours if needed, and 15 minutes prior to exercise.  Subjective and objective measures of pulmonary function will be followed and the treatment plan will be adjusted accordingly.  COPD mixed type ([place])  Tobacco cessation has been discussed and encouraged.  Albuterol as needed.   Meds ordered this encounter  Medications  . mometasone (NASONEX) 50 MCG/ACT nasal spray    Sig: Place 2 sprays into the nose daily.    Dispense:  17 g    Refill:  5  . albuterol (PROVENTIL HFA;VENTOLIN HFA) 108 (90 Base) MCG/ACT inhaler    Sig: Inhale 1-2 puffs into the lungs every 6 (six) hours as needed for wheezing or shortness of breath.    Dispense:  1 Inhaler    Refill:  1    Diagnostics: Spirometry: Spirometry reveals an FVC of 2.41 L and an FEV1 of 1.13 L (97% predicted).  This study was performed while the patient was asymptomatic.  Please see scanned spirometry results for details. Allergy skin testing: We were unable to perform skin tests today due to recent administration of antihistamine.       Physical examination: Blood pressure 124/74, pulse 90, temperature 98 F (36.7 C), temperature source Oral, resp. rate  17, height 5' 3"  (1.6 m), weight 191 lb 6.4 oz (86.8 kg), SpO2 98 %.  General: Alert, interactive, in no acute distress. HEENT: TMs pearly gray, turbinates moderately edematous with thick discharge, post-pharynx moderately erythematous. Neck: Supple without lymphadenopathy. Lungs: Clear to auscultation without wheezing, rhonchi or  rales. CV: Normal S1, S2 without murmurs. Abdomen: Nondistended, nontender. Skin: Warm and dry, without lesions or rashes. Extremities:  No clubbing, cyanosis or edema. Neuro:   Grossly intact.  Review of systems:  Review of systems negative except as noted in HPI / PMHx or noted below: Review of Systems  Constitutional: Negative.   HENT: Negative.   Eyes: Negative.   Respiratory: Negative.   Cardiovascular: Negative.   Gastrointestinal: Negative.   Genitourinary: Negative.   Musculoskeletal: Negative.   Skin: Negative.   Neurological: Negative.   Endo/Heme/Allergies: Negative.   Psychiatric/Behavioral: Negative.     Past medical history:  Past Medical History:  Diagnosis Date  . ALLERGIC RHINITIS   . Allergy   . ANXIETY    no medations needed  . Arthritis   . Bronchitis   . Cataract    no surgery  . COLONIC POLYPS, HX OF   . COPD    pt unsure of this dx  . Cough   . DEPRESSION   . Fatty liver   . GERD   . GLUCOSE INTOLERANCE   . HYPERTENSION   . IBS   . Impaired glucose tolerance   . Obstructive sleep apnea 06/25/2018  . OSTEOPENIA   . Osteopenia   . Osteoporosis 07/30/2017  . PERIPHERAL EDEMA   . Pneumonia, organism unspecified(486)   . TOBACCO USE DISORDER/SMOKER-SMOKING CESSATION DISCUSSED     Past surgical history:  Past Surgical History:  Procedure Laterality Date  . birthmark removed     from back as a child  . bladder mesh     2012  . CESAREAN SECTION     x2  . CHOLECYSTECTOMY    . COLONOSCOPY    . WISDOM TOOTH EXTRACTION      Family history: Family History  Problem Relation Age of Onset  . Breast cancer Sister 40       +lump and tamoxifen; eventually had bilateral mastectomies  . Allergic rhinitis Sister   . Colon cancer Father        dx. >35  . Bladder Cancer Father        dx. 32-79; not a smoker  . Skin cancer Father        basal cell carcinoma  . Colon cancer Paternal Grandfather        unspecified age  . Melanoma Paternal  Grandfather        unspecified age  . Skin cancer Mother        NOS; dx. 44s  . Other Mother        hx of hysterectomy in her early 52s for fibroid cysts  . Heart attack Maternal Uncle 67  . Heart attack Maternal Grandmother 62  . Thyroid cancer Paternal Grandmother 88  . Breast cancer Sister        dx. early 97s; s/p BL mastectomies  . Cervical cancer Sister        dx. late 34s  . Skin cancer Brother        NOS  . Anxiety disorder Unknown   . Hypothyroidism Unknown   . Hyperlipidemia Unknown   . Diabetes Unknown   . Esophageal cancer Neg Hx   . Stomach cancer Neg  Hx   . Rectal cancer Neg Hx     Social history: Social History   Socioeconomic History  . Marital status: Widowed    Spouse name: Not on file  . Number of children: 2  . Years of education: Not on file  . Highest education level: Not on file  Occupational History  . Occupation: Occupational psychologist industries, Museum/gallery curator for [employer]  Social Needs  . Financial resource strain: Not on file  . Food insecurity:    Worry: Not on file    Inability: Not on file  . Transportation needs:    Medical: Not on file    Non-medical: Not on file  Tobacco Use  . Smoking status: Current Every Day Smoker    Packs/day: 1.00    Years: 35.00    Pack years: 35.00    Types: Cigarettes  . Smokeless tobacco: Never Used  Substance and Sexual Activity  . Alcohol use: Yes    Alcohol/week: 21.0 standard drinks    Types: 7 Glasses of wine, 14 Standard drinks or equivalent per week  . Drug use: No  . Sexual activity: Not on file  Lifestyle  . Physical activity:    Days per week: Not on file    Minutes per session: Not on file  . Stress: Not on file  Relationships  . Social connections:    Talks on phone: Not on file    Gets together: Not on file    Attends religious service: Not on file    Active member of club or organization: Not on file    Attends meetings of clubs or organizations: Not on file    Relationship  status: Not on file  . Intimate partner violence:    Fear of current or ex partner: Not on file    Emotionally abused: Not on file    Physically abused: Not on file    Forced sexual activity: Not on file  Other Topics Concern  . Not on file  Social History Narrative  . Not on file   Environmental History: The patient lives in a 68 year old house with carpeting in the bedroom, gas heat, and central air.  There is a dog and cat in the home which have access to the bedroom.  There is no known mold/water damage in the home.  She has been a cigarette smoker since 1980, averaging 1 pack of cigarettes per day  Allergies as of 06/28/2018      Reactions   Ace Inhibitors Cough   Penicillins Rash      Medication List        Accurate as of 06/28/18  9:42 PM. Always use your most recent med list.          albuterol 108 (90 Base) MCG/ACT inhaler Commonly known as:  PROVENTIL HFA;VENTOLIN HFA Inhale 1-2 puffs into the lungs every 6 (six) hours as needed for wheezing or shortness of breath.   ALPRAZolam 0.5 MG tablet Commonly known as:  XANAX Take 1-2 tablets (0.5-1 mg total) by mouth at bedtime as needed.   CALCIUM 600+D 600-800 MG-UNIT Tabs Generic drug:  Calcium Carb-Cholecalciferol   citalopram 10 MG tablet Commonly known as:  CELEXA Take 1 tablet (10 mg total) by mouth daily.   denosumab 60 MG/ML Sosy injection Commonly known as:  PROLIA Inject 60 mg into the skin every 6 (six) months.   fluticasone 50 MCG/ACT nasal spray Commonly known as:  FLONASE Place 2 sprays into both nostrils daily  as needed for allergies or rhinitis.   folic acid 614 MCG tablet Commonly known as:  FOLVITE Take 400 mcg by mouth daily.   loratadine 10 MG tablet Commonly known as:  CLARITIN Take 10 mg by mouth daily as needed.   losartan 50 MG tablet Commonly known as:  COZAAR Take 1 tablet (50 mg total) by mouth daily.   mometasone 50 MCG/ACT nasal spray Commonly known as:  NASONEX Place 2  sprays into the nose daily.   MUCINEX ALLERGY PO Take 600 mg by mouth as needed.   TURMERIC PO Take 1,000 mg by mouth daily. Liquid   VITAMIN D PO Take by mouth daily.       Known medication allergies: Allergies  Allergen Reactions  . Ace Inhibitors Cough  . Penicillins Rash    I appreciate the opportunity to take part in Mahealani's care. Please do not hesitate to contact me with questions.  Sincerely,   R. Edgar Frisk, MD

## 2018-06-28 NOTE — Assessment & Plan Note (Signed)
   Tobacco cessation has been discussed and encouraged.  Albuterol as needed.

## 2018-06-28 NOTE — Assessment & Plan Note (Signed)
   A prescription has been provided for albuterol HFA, 1 to 2 inhalations every 4-6 hours if needed, and 15 minutes prior to exercise.  Subjective and objective measures of pulmonary function will be followed and the treatment plan will be adjusted accordingly.

## 2018-06-28 NOTE — Assessment & Plan Note (Deleted)
   Tobacco cessation has been discussed and encouraged.  Albuterol as needed.

## 2018-06-28 NOTE — Assessment & Plan Note (Signed)
We were unable to perform skin tests today due to recent administration of antihistamine.   Given her aversion to the taste of Flonase, a prescription has been provided for mometasone nasal spray, 2 sprays per nostril daily as needed.  Nasal saline lavage (NeilMed) has been recommended as needed and prior to medicated nasal sprays along with instructions for proper administration.  For thick post nasal drainage, add guaifenesin [phone] mg (Mucinex)  twice daily as needed with adequate hydration as discussed.  The patient is scheduled to return in the near future for allergy skin testing after having been off of antihistamines for at least 3 days.  Further recommendations will be made at that time based upon skin test results.

## 2018-07-08 ENCOUNTER — Ambulatory Visit: Payer: Medicare HMO | Admitting: Family Medicine

## 2018-07-08 ENCOUNTER — Encounter: Payer: Self-pay | Admitting: Family Medicine

## 2018-07-08 VITALS — BP 116/74 | HR 76 | Temp 98.1°F | Resp 16

## 2018-07-08 DIAGNOSIS — H1013 Acute atopic conjunctivitis, bilateral: Secondary | ICD-10-CM

## 2018-07-08 DIAGNOSIS — J411 Mucopurulent chronic bronchitis: Secondary | ICD-10-CM | POA: Diagnosis not present

## 2018-07-08 DIAGNOSIS — J302 Other seasonal allergic rhinitis: Secondary | ICD-10-CM | POA: Diagnosis not present

## 2018-07-08 DIAGNOSIS — H1010 Acute atopic conjunctivitis, unspecified eye: Secondary | ICD-10-CM | POA: Insufficient documentation

## 2018-07-08 DIAGNOSIS — J449 Chronic obstructive pulmonary disease, unspecified: Secondary | ICD-10-CM | POA: Diagnosis not present

## 2018-07-08 DIAGNOSIS — J3089 Other allergic rhinitis: Secondary | ICD-10-CM | POA: Diagnosis not present

## 2018-07-08 DIAGNOSIS — J300 Vasomotor rhinitis: Secondary | ICD-10-CM | POA: Insufficient documentation

## 2018-07-08 NOTE — Addendum Note (Signed)
Addended by: Golda Acre C on: 07/08/2018 10:31 PM   Modules accepted: Level of Service

## 2018-07-08 NOTE — Patient Instructions (Addendum)
Allergic rhinitis, mixed Your allergy skin testing today was positive to: dust mite and slightly positive to grass pollen and tree pollen. Allergen avoidance measures provided Continue Flonase as needed for a stuffy nose. Technique reviewed today in the office Continue saline nasal rinses one a day. Use before any medicated nasal sprays Continue Mucinex [phone] mg twice a day for thick post nasal drainage as well as adequate hydration  Bronchitis, recurrent Continue albuterol (Proventil) 2 puffs every 6 hours as needed for cough or wheeze Use Proventil 2 puffs 5-15 minutes before exercise to reduce cough and wheeze  COPE mixed type Continue with tobacco cessation process  Call us if this treatment plan is not working well for you  Follow up in 3 months or sooner if needed    Control of [place] dust mites play a major role in allergic asthma and rhinitis.  They occur in environments with high humidity wherever human skin, the food for dust mites is found. High levels have been detected in dust obtained from mattresses, pillows, carpets, upholstered furniture, bed covers, clothes and soft toys.  The principal allergen of the house dust mite is found in its feces.  A gram of dust may contain 1,000 mites and 250,000 fecal particles.  Mite antigen is easily measured in the air during house cleaning activities.    1. Encase mattresses, including the box spring, and pillow, in an air tight cover.  Seal the zipper end of the encased mattresses with wide adhesive tape. 2. Wash the bedding in water of 130 degrees Farenheit weekly.  Avoid cotton comforters/quilts and flannel bedding: the most ideal bed covering is the dacron comforter. 3. Remove all upholstered furniture from the bedroom. 4. Remove carpets, carpet padding, rugs, and non-washable window drapes from the bedroom.  Wash drapes weekly or use plastic window coverings. 5. Remove all non-washable stuffed toys from  the bedroom.  Wash stuffed toys weekly. 6. Have the room cleaned frequently with a vacuum cleaner and a damp dust-mop.  The patient should not be in a room which is being cleaned and should wait 1 hour after cleaning before going into the room. 7. Close and seal all heating outlets in the bedroom.  Otherwise, the room will become filled with dust-laden air.  An electric heater can be used to heat the room. 8. Reduce indoor humidity to less than 50%.  Do not use a humidifier.  Reducing Pollen Exposure  The American Academy of Allergy, Asthma and Immunology suggests the following steps to reduce your exposure to pollen during allergy seasons.    1. Do not hang sheets or clothing out to dry; pollen may collect on these items. 2. Do not mow lawns or spend time around freshly cut grass; mowing stirs up pollen. 3. Keep windows closed at night.  Keep car windows closed while driving. 4. Minimize morning activities outdoors, a time when pollen counts are usually at their highest. 5. Stay indoors as much as possible when pollen counts or humidity is high and on windy days when pollen tends to remain in the air longer. 6. Use air conditioning when possible.  Many air conditioners have filters that trap the pollen spores. 7. Use a HEPA room air filter to remove pollen form the indoor air you breathe.

## 2018-07-08 NOTE — Progress Notes (Addendum)
[address] [place] [address] Dept: [phone]  FOLLOW UP NOTE  Patient ID: Crystal [name], female    DOB: [birth year]  Age: 67 y.o. MRN: [id number] Date of Office Visit: 07/08/2018  Assessment  Chief Complaint: Allergy Testing  HPI Crystal [name] is a 67 year old female who presents to the clinic for a follow up visit including allergy skin testing. She was last seen on 06/28/2018 by Dr. Verlin Fester for evaluation of rhinitis, recurrent bronchitis, and mixed type COPD. At that time, skin testing was deferred due to recent antihistamine administration. At today's visit, she reports no shortness of breath, cough or wheeze.  She has noted some increased and postnasal drainage in the interval since her initial visit.  She has not taken antihistamines for at least 3 days in anticipation of today's test. Her current medications are listed in the chart.    Drug Allergies:  Allergies  Allergen Reactions  . Ace Inhibitors Cough  . Penicillins Rash    Physical Exam: BP 116/74 (BP Location: Right Arm, Patient Position: Sitting, Cuff Size: Normal)   Pulse 76   Temp 98.1 F (36.7 C) (Oral)   Resp 16   SpO2 97%    Physical Exam  Constitutional: She is oriented to person, place, and time. She appears well-developed and well-nourished.  HENT:  Head: Normocephalic and atraumatic.  Right Ear: External ear normal.  Left Ear: External ear normal.  Bilateral nares edematous with no nasal drainage noted. Pharynx slightly erythematous with no exudates noted. Ears normal. Eyes normal.   Eyes: Conjunctivae are normal.  Neck: Normal range of motion. Neck supple.  Cardiovascular: Normal rate, regular rhythm and normal heart sounds.  No murmur noted  Pulmonary/Chest: Effort normal and breath sounds normal.  Lungs clear to auscultation  Musculoskeletal: Normal range of motion.  Neurological: She is alert and oriented to person, place, and time.  Skin: Skin is warm and dry.    Psychiatric: She has a normal mood and affect. Her behavior is normal. Judgment and thought content normal.  Vitals reviewed.   Diagnostics: Spirometry: FVC 2.46, FEV1 2.08. Predicted FVC 2.94, predicted FEV1 2.23. Spirometry is within the normal range.  Environmental percutaneous testing: Positive to dust mite.  Intradermal testing: Positive to [place] grass, 7 grass, and tree mix.   Assessment and Plan: 1. Seasonal and perennial allergic rhinitis   2. Bronchitis, recurrent ([place])   3. COPD mixed type ([place])   4. Allergic conjunctivitis of both eyes     Patient Instructions  Allergic rhinitis, mixed Your allergy skin testing today was positive to: dust mite and slightly positive to grass pollen and tree pollen. Allergen avoidance measures provided Continue Flonase as needed for a stuffy nose. Technique reviewed today in the office Continue saline nasal rinses one a day. Use before any medicated nasal sprays Continue Mucinex [phone] mg twice a day for thick post nasal drainage as well as adequate hydration  Bronchitis, recurrent Continue albuterol (Proventil) 2 puffs every 6 hours as needed for cough or wheeze Use Proventil 2 puffs 5-15 minutes before exercise to reduce cough and wheeze  COPD mixed type Continue with tobacco cessation process  Call us if this treatment plan is not working well for you  Follow up in 3 months or sooner if needed   Return in about 3 months (around 10/07/2018), or if symptoms worsen or fail to improve.    Thank you for the opportunity to care for this patient.  Please do  not hesitate to contact me with questions.  Gareth Morgan, FNP [facility]  _________________________________________________  I have provided oversight concerning [facility]'s evaluation and treatment of this patient's health issues addressed during today's encounter.  I agree with the assessment and therapeutic plan as outlined in the note.    Signed,   R Edgar Frisk, MD

## 2018-07-13 ENCOUNTER — Telehealth: Payer: Self-pay | Admitting: Internal Medicine

## 2018-07-13 NOTE — Telephone Encounter (Signed)
We are still waiting on insurance benefit coverage for current prolia shot needed, and we have talked about previous shot being codes as office visit, should have been coded as nurse visit, so [facility] is denying payment for prolia given in march/2019---I am talking with billing dept to help with getting coding changed for first prolia injection in march/2019---I will call patient back as soon as I have more info

## 2018-07-13 NOTE — Telephone Encounter (Signed)
Spoke to rep@[facility]  [facility] is in network with them to do pt's ct Joellen [place]

## 2018-07-13 NOTE — Telephone Encounter (Signed)
This pt was calling to make sure her hst was authorized and [facility] has her order and will call to set up the study Joellen Jersey

## 2018-07-13 NOTE — Telephone Encounter (Signed)
Patient Is calling and requesting Jonelle Sidle to give her a call in regards to her Prolia injection. Please contact [phone]  Copied from [place] [phone]. Topic: Appointment Scheduling - Scheduling Inquiry for Clinic >> Jul 08, 2018  7:46 AM Lennox Solders wrote: Reason for CRM: pt is calling and would like to schedule her 2nd prolia injection >> Jul 08, 2018  8:06 AM Para Skeans A wrote: It is okay to schedule patient? There is a TE from June, but doesn't seem like she can schedule yet? >> Jul 13, 2018  7:35 AM Conception Chancy, NT wrote: Patient is calling to follow up on this. She would like a call after 11am.   [phone]

## 2018-07-20 DIAGNOSIS — G4733 Obstructive sleep apnea (adult) (pediatric): Secondary | ICD-10-CM | POA: Diagnosis not present

## 2018-07-21 ENCOUNTER — Other Ambulatory Visit: Payer: Self-pay | Admitting: *Deleted

## 2018-07-21 DIAGNOSIS — G4733 Obstructive sleep apnea (adult) (pediatric): Secondary | ICD-10-CM

## 2018-07-22 ENCOUNTER — Other Ambulatory Visit: Payer: Self-pay | Admitting: Internal Medicine

## 2018-07-23 NOTE — Telephone Encounter (Signed)
Pt calling back about this ongoing issue. Pt states her insurance [employer] advised her that as of 06/2017 her plan did not require prior authorization for the prolia. Pt states there is a breakdown between [employer] and [employer]. Please call asap. Thank you!!

## 2018-07-23 NOTE — Telephone Encounter (Signed)
I am having to check with margaret/prolia---I will call patient back with more information when I have it

## 2018-08-02 ENCOUNTER — Telehealth: Payer: Self-pay | Admitting: Internal Medicine

## 2018-08-02 NOTE — Telephone Encounter (Signed)
Called and spoke with patient. Patient was unsure if call came from [facility] or [facility].  Home sleep was done 10/1, but results not posted at this time. Patient was going to try PCP.  Nothing further at this time.

## 2018-08-04 NOTE — Telephone Encounter (Signed)
Copied from [place] [phone]. Topic: Appointment Scheduling - Scheduling Inquiry for Clinic >> Jul 08, 2018  7:46 AM Lennox Solders wrote: Reason for CRM: pt is calling and would like to schedule her 2nd prolia injection >> Jul 08, 2018  8:06 AM Para Skeans A wrote: It is okay to schedule patient? There is a TE from June, but doesn't seem like she can schedule yet? >> Jul 13, 2018  7:35 AM Conception Chancy, NT wrote: Patient is calling to follow up on this. She would like a call after 11am.   [phone] >> Jul 28, 2018  7:56 AM Keene Breath wrote: Patient called again to get the status of her Prolia injection.  Patient would like a call with any updated information.  CB# [phone].  Patient;s insurance co/[employer] is still saying they will not release information to [employer] for reimbursement for shot given on December 18, 2017, until [facility] billing dept has coding corrected on explanation of services---I am contacting [facility] billing to see what we can work out, I will call patient back

## 2018-08-04 NOTE — Telephone Encounter (Signed)
See other phone note, patient is still waiting on reimbursement for march/2019 injection

## 2018-08-18 DIAGNOSIS — Z1231 Encounter for screening mammogram for malignant neoplasm of breast: Secondary | ICD-10-CM | POA: Diagnosis not present

## 2018-08-19 ENCOUNTER — Telehealth: Payer: Self-pay | Admitting: Internal Medicine

## 2018-08-19 DIAGNOSIS — G4733 Obstructive sleep apnea (adult) (pediatric): Secondary | ICD-10-CM

## 2018-08-19 NOTE — Telephone Encounter (Signed)
Home sleep test showed moderate obstructive sleep apnea, averaging 20 apneas/ hour, with drops in blood oxygen level.  For scores in this range, CPAP would be first choice, but we can refer her for consideration of an oral appliance if she can't get comfortable with CPAP.  Order- new DME, new CPAP  Auto 5-20, mask of choice, humidifier, supplies  Please make sure she has a return ov scheduled in 31-90 days per insurance regs

## 2018-08-19 NOTE — Telephone Encounter (Signed)
Called and spoke to pt. Pt states she seen on mychart, that her HST showed moderate OSA. Pt is wanting to know next steps.  CY please advise. Thanks  Current Outpatient Medications on File Prior to Visit  Medication Sig Dispense Refill  . albuterol (PROVENTIL HFA;VENTOLIN HFA) 108 (90 Base) MCG/ACT inhaler Inhale 1-2 puffs into the lungs every 6 (six) hours as needed for wheezing or shortness of breath. 1 Inhaler 1  . ALPRAZolam (XANAX) 0.5 MG tablet Take 1-2 tablets (0.5-1 mg total) by mouth at bedtime as needed. 60 tablet 4  . Calcium Carb-Cholecalciferol (CALCIUM 600+D) 600-800 MG-UNIT TABS     . Cholecalciferol (VITAMIN D PO) Take by mouth daily.    . citalopram (CELEXA) 10 MG tablet TAKE 1 TABLET (10 MG TOTAL) BY MOUTH DAILY. 90 tablet 1  . denosumab (PROLIA) 60 MG/ML SOSY injection Inject 60 mg into the skin every 6 (six) months.    . Fexofenadine HCl (MUCINEX ALLERGY PO) Take 600 mg by mouth as needed.    . fluticasone (FLONASE) 50 MCG/ACT nasal spray Place 2 sprays into both nostrils daily as needed for allergies or rhinitis.    . folic acid (FOLVITE) 451 MCG tablet Take 400 mcg by mouth daily.    Marland Kitchen loratadine (CLARITIN) 10 MG tablet Take 10 mg by mouth daily as needed.     Marland Kitchen losartan (COZAAR) 50 MG tablet Take 1 tablet (50 mg total) by mouth daily. 90 tablet 1  . TURMERIC PO Take 1,000 mg by mouth daily. Liquid     No current facility-administered medications on file prior to visit.    Allergies  Allergen Reactions  . Ace Inhibitors Cough  . Penicillins Rash

## 2018-08-19 NOTE — Telephone Encounter (Signed)
Pt is aware of results and voiced her understanding.  Pt states she would prefer trail of oral appliance.  Order has been placed to Dr. Ron Parker.  Pt has pending OV for 09/30/18 with CY. Nothing further is needed.

## 2018-09-17 ENCOUNTER — Other Ambulatory Visit: Payer: Self-pay | Admitting: Internal Medicine

## 2018-09-20 ENCOUNTER — Telehealth: Payer: Self-pay

## 2018-09-20 NOTE — Telephone Encounter (Signed)
Copied from [place] [id number]. Topic: General - Other >> Sep 20, 2018  7:27 AM Lennox Solders wrote: Reason for CRM: pt is calling the EOB has arrived and she would like to come and have  sign. Jonelle Sidle please call patient  Patient has talked with [employer], all they need is the EOB faxed, all other paperwork has already been received, patient has faxed EOB to [employer]---should be completing reimbursement soon---pt to call  back if [employer] needs any additional information

## 2018-09-20 NOTE — Telephone Encounter (Signed)
   LOV:03/08/18 NextOV:n/s Last Filled/Quantity:07/27/18 60#

## 2018-09-20 NOTE — Telephone Encounter (Signed)
Done erx 

## 2018-09-30 ENCOUNTER — Ambulatory Visit: Payer: Medicare HMO | Admitting: Internal Medicine

## 2018-10-07 DIAGNOSIS — G4733 Obstructive sleep apnea (adult) (pediatric): Secondary | ICD-10-CM | POA: Diagnosis not present

## 2018-10-18 ENCOUNTER — Ambulatory Visit: Payer: Medicare HMO | Admitting: Internal Medicine

## 2018-10-18 ENCOUNTER — Encounter: Payer: Self-pay | Admitting: Internal Medicine

## 2018-10-18 VITALS — BP 132/88 | HR 112 | Ht 63.0 in | Wt 192.8 lb

## 2018-10-18 DIAGNOSIS — G4733 Obstructive sleep apnea (adult) (pediatric): Secondary | ICD-10-CM

## 2018-10-18 DIAGNOSIS — J449 Chronic obstructive pulmonary disease, unspecified: Secondary | ICD-10-CM | POA: Diagnosis not present

## 2018-10-18 NOTE — Assessment & Plan Note (Signed)
She is off to a good start working with Dr. Ron Parker on her oral appliance.  I emphasized that she needs to let him know if it is uncomfortable so he can make adjustments.

## 2018-10-18 NOTE — Patient Instructions (Signed)
Be sure to give Dr Ron Parker feedback as you work with the mouth piece, so he can make adjustments if needed.  Please try to stop smoking, as a New Year's gift to yourself.  We will be happy to see you again if needed

## 2018-10-18 NOTE — Progress Notes (Signed)
06/24/2018-67 year old female  smoker for sleep evaluation. Sleep Consult: Dr Cathlean Cower. Pt had sleep study about 12-13 years ago-was not able to complete test due to hook ups,etc. Never put on CPAP. Medical problem list includes allergic rhinitis, anxiety, COPD, HBP, GERD, Epworth score 10 Widowed. Visited daughter who told her she snores loudly.  Some coughing at night.  Sleeps with a pillow under her knees for comfort.  Tosses and turns at night.  Takes Ambien 0.5 mg, 2 cups of coffee in the morning only.  Some days aware of daytime sleepiness.  Naps help. Denies ENT surgery. Currently smoking 1 pack/day.  Occasional episode of bronchitis every 3 or 4 years but otherwise denies lung disease.  Denies heart disease. Declines flu shot. Epworth score 10  10/18/2018-67 year old female Smoker followed for OSA/ oral appliance, complicated by allergic rhinitis, anxiety, COPD, HBP, GERD HST 07/20/2018-AHI 20/hour, desaturation to 81%, body weight 192 pounds -----OSA; Pt using oral appliance through Dr. Ron Parker. Has been using x 10 days and returns on 11-11-18.  albuterol HFA, She is still getting used to wearing oral appliance and is not making any judgments yet about it.  Still moves around a lot in her sleep but we think she would have more trouble trying to work with CPAP hoses etc. Her allergist provides a rescue albuterol inhaler which she uses about 3 times a week, before exercise. I encouraged her again to make a real effort to stop smoking before it gets her in more trouble.  ROS-see HPI   + = positive Constitutional:    weight loss, night sweats, fevers, chills, +fatigue, lassitude. HEENT:    headaches, difficulty swallowing, tooth/dental problems, sore throat,       sneezing, itching, ear ache, nasal congestion, post nasal drip, snoring CV:    chest pain, orthopnea, PND, swelling in lower extremities, anasarca,                                  dizziness, palpitations Resp:  + shortness of breath  with exertion or at rest.                productive cough,   non-productive cough, coughing up of blood.              change in color of mucus.  wheezing.   Skin:    rash or lesions. GI:  No-   heartburn, indigestion, abdominal pain, nausea, vomiting, diarrhea,                 change in bowel habits, loss of appetite GU: dysuria, change in color of urine, no urgency or frequency.   flank pain. MS:   joint pain, stiffness, decreased range of motion, back pain. Neuro-     nothing unusual Psych:  change in mood or affect.  depression or anxiety.   memory loss.  OBJ- Physical Exam General- Alert, Oriented, Affect-appropriate, Distress- none acute, + overweight Skin- rash-none, lesions- none, excoriation- none Lymphadenopathy- none Head- atraumatic            Eyes- Gross vision intact, PERRLA, conjunctivae and secretions clear            Ears- Hearing, canals-normal            Nose- Clear, no-Septal dev, mucus, polyps, erosion, perforation             Throat- Mallampati II-III , mucosa clear , drainage- none, tonsils-  atrophic Neck- flexible , trachea midline, no stridor , thyroid nl, carotid no bruit Chest - symmetrical excursion , unlabored           Heart/CV- RRR , no murmur , no gallop  , no rub, nl s1 s2                           - JVD- none , edema- none, stasis changes- none, varices- none           Lung- clear to P&A, wheeze- none, cough- none , dullness-none, rub- none           Chest wall-  Abd-  Br/ Gen/ Rectal- Not done, not indicated Extrem- cyanosis- none, clubbing, none, atrophy- none, strength- nl Neuro- grossly intact to observation

## 2018-10-18 NOTE — Assessment & Plan Note (Signed)
She does not think she needs anything more than occasional use of her rescue inhaler before exercise.  She denies any acute exacerbation.  We can try to help her with this problem if needed but she has enough support now.

## 2018-10-19 ENCOUNTER — Other Ambulatory Visit: Payer: Self-pay | Admitting: Internal Medicine

## 2018-10-25 ENCOUNTER — Encounter: Payer: Self-pay | Admitting: Allergy and Immunology

## 2018-10-25 ENCOUNTER — Ambulatory Visit: Payer: Medicare HMO | Admitting: Allergy and Immunology

## 2018-10-25 VITALS — BP 132/88 | HR 92 | Resp 18

## 2018-10-25 DIAGNOSIS — J3089 Other allergic rhinitis: Secondary | ICD-10-CM

## 2018-10-25 DIAGNOSIS — J449 Chronic obstructive pulmonary disease, unspecified: Secondary | ICD-10-CM | POA: Diagnosis not present

## 2018-10-25 NOTE — Patient Instructions (Addendum)
COPD Well-controlled.  Continue albuterol HFA, 1 to 2 inhalations every 4-6 hours if needed and 15 minutes prior to exercise.  Subjective and objective measures of pulmonary function will be followed and the treatment plan will be adjusted accordingly.  Other allergic rhinitis  Continue appropriate allergen avoidance measures, nasal steroid spray as needed, guaifenesin (Mucinex) as needed, and nasal saline irrigation as needed.   Return in about 6-12 months, or if symptoms worsen or fail to improve.

## 2018-10-25 NOTE — Assessment & Plan Note (Signed)
Well-controlled.  Continue albuterol HFA, 1 to 2 inhalations every 4-6 hours if needed and 15 minutes prior to exercise.  Subjective and objective measures of pulmonary function will be followed and the treatment plan will be adjusted accordingly.

## 2018-10-25 NOTE — Assessment & Plan Note (Signed)
   Continue appropriate allergen avoidance measures, nasal steroid spray as needed, guaifenesin (Mucinex) as needed, and nasal saline irrigation as needed.

## 2018-10-25 NOTE — Progress Notes (Signed)
Follow-up Note  RE: ARANZA GEDDES MRN: [id number] DOB: 09/15/1951 Date of Office Visit: 10/25/2018  Primary care provider: Biagio Borg, MD Referring provider: Biagio Borg, MD  History of present illness: Spadafora is a 68 y.o. female with allergic rhinitis, recurrent bronchitis, and COPD presenting today for follow-up.  She was previously seen in this clinic for her initial evaluation on June 28, 2018.  She reports improvement regarding postnasal drainage after having discontinued loratadine and started mometasone nasal spray and guaifenesin as needed.  In addition, she reports that she that her sense of smell has improved in the interval since her previous visit.  Regarding her COPD, she only requires albuterol prior to exercise.  She does not experience nocturnal awakenings or limitations in normal daily activities due to lower respiratory symptoms.  Assessment and plan: COPD Well-controlled.  Continue albuterol HFA, 1 to 2 inhalations every 4-6 hours if needed and 15 minutes prior to exercise.  Subjective and objective measures of pulmonary function will be followed and the treatment plan will be adjusted accordingly.  Other allergic rhinitis  Continue appropriate allergen avoidance measures, nasal steroid spray as needed, guaifenesin (Mucinex) as needed, and nasal saline irrigation as needed.   Diagnostics: Spirometry:  Normal with an FEV1 of 95% predicted.  Please see scanned spirometry results for details.    Physical examination: Blood pressure 132/88, pulse 92, resp. rate 18, SpO2 99 %.  General: Alert, interactive, in no acute distress. HEENT: TMs pearly gray, turbinates mildly edematous without discharge, post-pharynx mildly erythematous. Neck: Supple without lymphadenopathy. Lungs: Clear to auscultation without wheezing, rhonchi or rales. CV: Normal S1, S2 without murmurs. Skin: Warm and dry, without lesions or rashes.  The following portions of the  patient's history were reviewed and updated as appropriate: allergies, current medications, past family history, past medical history, past social history, past surgical history and problem list.  Allergies as of 10/25/2018      Reactions   Ace Inhibitors Cough   Penicillins Rash      Medication List       Accurate as of October 25, 2018  1:38 PM. Always use your most recent med list.        albuterol 108 (90 Base) MCG/ACT inhaler Commonly known as:  PROVENTIL HFA;VENTOLIN HFA Inhale 1-2 puffs into the lungs every 6 (six) hours as needed for wheezing or shortness of breath.   alendronate 70 MG tablet Commonly known as:  FOSAMAX TAKE 1 TABLET BY MOUTH EVERY 7 DAYS WITH A FULL GLASS OF WATER ON EMPTY STOMACH   ALPRAZolam 0.5 MG tablet Commonly known as:  XANAX TAKE 1-2 TABLETS BY MOUTH AT BEDTIME AS NEEDED.   CALCIUM 600+D 600-800 MG-UNIT Tabs Generic drug:  Calcium Carb-Cholecalciferol   citalopram 10 MG tablet Commonly known as:  CELEXA TAKE 1 TABLET (10 MG TOTAL) BY MOUTH DAILY.   denosumab 60 MG/ML Sosy injection Commonly known as:  PROLIA Inject 60 mg into the skin every 6 (six) months.   fluticasone 50 MCG/ACT nasal spray Commonly known as:  FLONASE Place 2 sprays into both nostrils daily as needed for allergies or rhinitis.   folic acid 948 MCG tablet Commonly known as:  FOLVITE Take 400 mcg by mouth daily.   loratadine 10 MG tablet Commonly known as:  CLARITIN Take 10 mg by mouth daily as needed.   losartan 50 MG tablet Commonly known as:  COZAAR Take 1 tablet (50 mg total) by mouth daily.   MUCINEX ALLERGY  PO Take 600 mg by mouth as needed.   TURMERIC PO Take 1,000 mg by mouth daily. Liquid   VITAMIN D PO Take by mouth daily.       Allergies  Allergen Reactions  . Ace Inhibitors Cough  . Penicillins Rash    I appreciate the opportunity to take part in Jariyah's care. Please do not hesitate to contact me with questions.  Sincerely,   R.  Edgar Frisk, MD

## 2018-12-01 ENCOUNTER — Telehealth: Payer: Self-pay | Admitting: Internal Medicine

## 2018-12-01 NOTE — Telephone Encounter (Signed)
Copied from [place]. Topic: General - Other >> Dec 01, 2018  7:22 AM Keene Breath wrote: Reason for CRM: Patient called to request that the nurse, Irene Shipper, call her concerning her Prolia shot.  Please advise and call patient back.  CB# [phone]

## 2018-12-01 NOTE — Telephone Encounter (Signed)
Patient needs to use medical side of [employer], part B, per [employer]---just like last time, if she uses her pharm benefit side, it will be denied, I have called margaret/prolia to get summary of benefits for medical side, patient will need to pay her copay, and get reimbursed --patient is aware she will need to work quickly to get information back to [employer] in order for reimbursement to be approved

## 2018-12-08 ENCOUNTER — Ambulatory Visit (INDEPENDENT_AMBULATORY_CARE_PROVIDER_SITE_OTHER): Payer: Medicare HMO

## 2018-12-08 DIAGNOSIS — M810 Age-related osteoporosis without current pathological fracture: Secondary | ICD-10-CM | POA: Diagnosis not present

## 2018-12-08 MED ORDER — DENOSUMAB 60 MG/ML ~~LOC~~ SOSY
60.0000 mg | PREFILLED_SYRINGE | Freq: Once | SUBCUTANEOUS | Status: AC
  Administered 2018-12-08: 60 mg via SUBCUTANEOUS

## 2018-12-08 NOTE — Progress Notes (Signed)
Medical screening examination/treatment/procedure(s) were performed by non-physician practitioner and as supervising physician I was immediately available for consultation/collaboration. I agree with above. Tra Wilemon, MD   

## 2018-12-22 DIAGNOSIS — H02401 Unspecified ptosis of right eyelid: Secondary | ICD-10-CM | POA: Diagnosis not present

## 2018-12-22 DIAGNOSIS — H43813 Vitreous degeneration, bilateral: Secondary | ICD-10-CM | POA: Diagnosis not present

## 2018-12-22 DIAGNOSIS — H31091 Other chorioretinal scars, right eye: Secondary | ICD-10-CM | POA: Diagnosis not present

## 2018-12-22 DIAGNOSIS — H2513 Age-related nuclear cataract, bilateral: Secondary | ICD-10-CM | POA: Diagnosis not present

## 2018-12-23 ENCOUNTER — Ambulatory Visit: Payer: Medicare HMO | Admitting: Gastroenterology

## 2018-12-30 ENCOUNTER — Other Ambulatory Visit (INDEPENDENT_AMBULATORY_CARE_PROVIDER_SITE_OTHER): Payer: Medicare HMO

## 2018-12-30 ENCOUNTER — Other Ambulatory Visit: Payer: Self-pay

## 2018-12-30 ENCOUNTER — Ambulatory Visit: Payer: Medicare HMO | Admitting: Gastroenterology

## 2018-12-30 ENCOUNTER — Encounter: Payer: Self-pay | Admitting: Gastroenterology

## 2018-12-30 VITALS — BP 134/68 | HR 80 | Temp 98.3°F | Ht 63.0 in | Wt 193.2 lb

## 2018-12-30 DIAGNOSIS — K760 Fatty (change of) liver, not elsewhere classified: Secondary | ICD-10-CM | POA: Diagnosis not present

## 2018-12-30 DIAGNOSIS — Z86010 Personal history of colonic polyps: Secondary | ICD-10-CM

## 2018-12-30 LAB — HEPATIC FUNCTION PANEL
ALBUMIN: 4.4 g/dL (ref 3.5–5.2)
ALT: 13 U/L (ref 0–35)
AST: 16 U/L (ref 0–37)
Alkaline Phosphatase: 66 U/L (ref 39–117)
Bilirubin, Direct: 0.1 mg/dL (ref 0.0–0.3)
Total Bilirubin: 0.6 mg/dL (ref 0.2–1.2)
Total Protein: 7.5 g/dL (ref 6.0–8.3)

## 2018-12-30 NOTE — Progress Notes (Signed)
HPI :  68 year old female here for a follow-up visit.  She has a history of fatty liver disease, suspected to have been driven by heavy alcohol use in the past. Since I have last seen her she had an ultrasound in October 2018 showing stable changes of fatty liver disease with no evidence of cirrhosis. She has significant tobacco or alcohol use to mostly social occasions at this time, perhaps a few drinks on the weekend. Over the past 2 years her liver enzymes have normalized. She's not had any jaundice or any concerning symptoms for cirrhosis. She has been more active and exercising with water aerobics, also been treated for sleep apnea using an oral device which is helping. She denies any major changes in her weight. She's had a negative serologic workup for other causes of liver disease in the past.  She otherwise has a history of numerous adenomatous, her colonoscopy history is as below. She denies any problems with her bowels at this time. She had negative genetic testing for polyposis syndromes.  Father had colon cancer and bladder cancer, colon cancer diagnosed in his late 28s.  She otherwise denies any reflux symptoms or dysphagia since she has stopped drinking alcohol.   [place] 07/20/2017 - post cholecystectomy, CBD 55m in size without change - stable since post chole, fatty liver  Endoscopic history: Colonoscopy 01/01/16 - 12 polyps removed, around 10 adenomas. She was seen by genetic counselor and testing was normal. EGD 04/16/17 - benign distal esophageal stenosis, hiatal hernia. Nodular duodenal mucosa - biopsied c/w benign burnner gland hyperplasia, patient had severe coughing and oxygen saturation with repeated attempts at anesthesia and case aborted -  Colonoscopy 04/16/17 - left sided diverticulosis, 5 small adenomas, cecal AVM - recall colonoscopy 3 years   Past Medical History:  Diagnosis Date  . ALLERGIC RHINITIS   . Allergy   . ANXIETY    no medations needed  . Arthritis    . Bronchitis   . Cataract    no surgery  . COLONIC POLYPS, HX OF   . COPD    pt unsure of this dx  . Cough   . DEPRESSION   . Fatty liver   . GERD   . GLUCOSE INTOLERANCE   . HYPERTENSION   . IBS   . Impaired glucose tolerance   . Obstructive sleep apnea 06/25/2018  . OSTEOPENIA   . Osteopenia   . Osteoporosis 07/30/2017  . PERIPHERAL EDEMA   . Pneumonia, organism unspecified(486)   . Sleep apnea    Oral Aparatus  . TOBACCO USE DISORDER/SMOKER-SMOKING CESSATION DISCUSSED      Past Surgical History:  Procedure Laterality Date  . birthmark removed     from back as a child  . bladder mesh     2012  . CESAREAN SECTION     x2  . CHOLECYSTECTOMY    . COLONOSCOPY    . WISDOM TOOTH EXTRACTION     Family History  Problem Relation Age of Onset  . Breast cancer Sister 40       +lump and tamoxifen; eventually had bilateral mastectomies  . Allergic rhinitis Sister   . Colon cancer Father        dx. >>10 . Bladder Cancer Father        dx. 746-79 not a smoker  . Skin cancer Father        basal cell carcinoma  . Colon cancer Paternal Grandfather        unspecified age  .  Melanoma Paternal Grandfather        unspecified age  . Skin cancer Mother        NOS; dx. 73s  . Other Mother        hx of hysterectomy in her early 63s for fibroid cysts  . Heart attack Maternal Uncle 67  . Heart attack Maternal Grandmother 62  . Thyroid cancer Paternal Grandmother 88  . Breast cancer Sister        dx. early 77s; s/p BL mastectomies  . Cervical cancer Sister        dx. late 52s  . Skin cancer Brother        NOS  . Anxiety disorder Other   . Hypothyroidism Other   . Hyperlipidemia Other   . Diabetes Other   . Esophageal cancer Neg Hx   . Stomach cancer Neg Hx   . Rectal cancer Neg Hx    Social History   Tobacco Use  . Smoking status: Current Every Day Smoker    Packs/day: 1.00    Years: 35.00    Pack years: 35.00    Types: Cigarettes  . Smokeless tobacco: Never Used   Substance Use Topics  . Alcohol use: Yes    Alcohol/week: 21.0 standard drinks    Types: 7 Glasses of wine, 14 Standard drinks or equivalent per week  . Drug use: No   Current Outpatient Medications  Medication Sig Dispense Refill  . albuterol (PROVENTIL HFA;VENTOLIN HFA) 108 (90 Base) MCG/ACT inhaler Inhale 1-2 puffs into the lungs every 6 (six) hours as needed for wheezing or shortness of breath. 1 Inhaler 1  . Calcium Carb-Cholecalciferol (CALCIUM 600+D) 600-800 MG-UNIT TABS     . Cholecalciferol (VITAMIN D PO) Take by mouth daily.    . citalopram (CELEXA) 10 MG tablet TAKE 1 TABLET (10 MG TOTAL) BY MOUTH DAILY. 90 tablet 1  . denosumab (PROLIA) 60 MG/ML SOSY injection Inject 60 mg into the skin every 6 (six) months.    . Fexofenadine HCl (MUCINEX ALLERGY PO) Take 600 mg by mouth as needed.    . fluticasone (FLONASE) 50 MCG/ACT nasal spray Place 2 sprays into both nostrils daily as needed for allergies or rhinitis.    . folic acid (FOLVITE) 157 MCG tablet Take 400 mcg by mouth daily.    Marland Kitchen losartan (COZAAR) 50 MG tablet Take 1 tablet (50 mg total) by mouth daily. 90 tablet 1  . TURMERIC PO Take 1,000 mg by mouth daily. Liquid     No current facility-administered medications for this visit.    Allergies  Allergen Reactions  . Ace Inhibitors Cough  . Penicillins Rash     Review of Systems: All systems reviewed and negative except where noted in HPI.   Lab Results  Component Value Date   WBC 6.4 03/05/2018   HGB 15.3 (H) 03/05/2018   HCT 45.0 03/05/2018   MCV 96.7 03/05/2018   PLT 249.0 03/05/2018    Lab Results  Component Value Date   CREATININE 0.62 03/05/2018   BUN 12 03/05/2018   NA 140 03/05/2018   K 4.5 03/05/2018   CL 106 03/05/2018   CO2 28 03/05/2018    Lab Results  Component Value Date   ALT 10 03/05/2018   AST 15 03/05/2018   ALKPHOS 51 03/05/2018   BILITOT 0.6 03/05/2018     Physical Exam: BP 134/68   Pulse 80   Temp 98.3 F (36.8 C)   Ht 5'  3" (1.6 m)  Wt 193 lb 4 oz (87.7 kg)   BMI 34.23 kg/m  Constitutional: Pleasant,well-developed, female in no acute distress. HEENT: Normocephalic and atraumatic. Conjunctivae are normal. No scleral icterus. Neck supple.  Cardiovascular: Normal rate, regular rhythm.  Pulmonary/chest: Effort normal and breath sounds normal. No wheezing, rales or rhonchi. Abdominal: Soft, nondistended, nontender. There are no masses palpable. No hepatomegaly. Extremities: no edema Lymphadenopathy: No cervical adenopathy noted. Neurological: Alert and oriented to person place and time. Skin: Skin is warm and dry. No rashes noted. Psychiatric: Normal mood and affect. Behavior is normal.   ASSESSMENT AND PLAN: 68 year old female here for reassessment of the following issues:  Fatty liver - likely driven by prior history of heavy alcohol use. She has significantly cut back over the past few years and her liver enzymes have normalized. Her last ultrasound showed no evidence of cirrhosis. I counseled her on the spectrum of fatty liver disease and risks for cirrhosis. Recommend she continue to minimize her alcohol use abstain if possible. She is due for liver enzyme check today, she can follow-up with me yearly for this issue.  History of colon adenomas - at least 15 adenomas on the last 2 colonoscopies, genetic testing negative for polyposis syndrome. Recall colonoscopy next due in June 2021.  Dixon Cellar, MD [facility]

## 2018-12-30 NOTE — Patient Instructions (Signed)
If you are age 69 or older, your body mass index should be between 23-30. Your Body mass index is 34.23 kg/m. If this is out of the aforementioned range listed, please consider follow up with your Primary Care Provider.  If you are age 14 or younger, your body mass index should be between 19-25. Your Body mass index is 34.23 kg/m. If this is out of the aformentioned range listed, please consider follow up with your Primary Care Provider.   Please go to the lab in the basement of our building to have lab work done as you leave today. Hit "B" for basement when you get on the elevator.  When the doors open the lab is on your left.  We will call you with the results. Thank you.  Thank you for entrusting me with your care and for choosing [facility], Dr. Walker Cellar

## 2019-01-19 ENCOUNTER — Other Ambulatory Visit: Payer: Self-pay | Admitting: Internal Medicine

## 2019-01-21 ENCOUNTER — Other Ambulatory Visit: Payer: Self-pay | Admitting: Internal Medicine

## 2019-04-25 ENCOUNTER — Ambulatory Visit (INDEPENDENT_AMBULATORY_CARE_PROVIDER_SITE_OTHER): Payer: Medicare HMO | Admitting: Allergy and Immunology

## 2019-04-25 ENCOUNTER — Other Ambulatory Visit: Payer: Self-pay

## 2019-04-25 ENCOUNTER — Encounter: Payer: Self-pay | Admitting: Allergy and Immunology

## 2019-04-25 VITALS — BP 150/88 | HR 87 | Temp 98.2°F | Resp 18

## 2019-04-25 DIAGNOSIS — J449 Chronic obstructive pulmonary disease, unspecified: Secondary | ICD-10-CM

## 2019-04-25 DIAGNOSIS — J3089 Other allergic rhinitis: Secondary | ICD-10-CM | POA: Diagnosis not present

## 2019-04-25 NOTE — Assessment & Plan Note (Signed)
Well-controlled.  Continue albuterol HFA, 1 to 2 inhalations every 4-6 hours if needed and 15 minutes prior to exercise.  Subjective and objective measures of pulmonary function will be followed and the treatment plan will be adjusted accordingly.

## 2019-04-25 NOTE — Progress Notes (Signed)
Follow-up Note  RE: AESHA AGRAWAL MRN: [id number] DOB: [birth year] Date of Office Visit: 04/25/2019  Primary care provider: Biagio Borg, MD Referring provider: Biagio Borg, MD  History of present illness: Crystal [name] is a 68 y.o. female with allergic rhinitis, recurrent bronchitis, and COPD presenting today for follow-up.  She was last seen in this clinic on October 25, 2018.  She reports that in the interval since her previous visit her COPD has been well controlled.  She has only required albuterol rescue on 1 or 2 occasions over the past few months, on both occasions she was "overheated" in the yard or in the car.  She denies limitations of normal daily activities or nocturnal awakenings due to lower respiratory symptoms. [name] reports that her nasal allergy symptoms have been well controlled with nasal saline lavage every other day and Nasonex 2 times per week on average.  She states that her postnasal drainage improved after having discontinued loratadine.  Assessment and plan: COPD Well-controlled.  Continue albuterol HFA, 1 to 2 inhalations every 4-6 hours if needed and 15 minutes prior to exercise.  Subjective and objective measures of pulmonary function will be followed and the treatment plan will be adjusted accordingly.  Other allergic rhinitis Stable.  Continue appropriate allergen avoidance measures, nasal steroid spray as needed, guaifenesin (Mucinex) as needed, and nasal saline irrigation as needed.   Diagnostics: Spirometry:  Normal with an FEV1 of 88% predicted and an FEV1 ratio of 105%.  Please see scanned spirometry results for details.    Physical examination: Blood pressure (!) 150/88, pulse 87, temperature 98.2 F (36.8 C), temperature source Temporal, resp. rate 18, SpO2 97 %.  General: Alert, interactive, in no acute distress. HEENT: TMs pearly gray, turbinates minimally edematous without discharge, post-pharynx moderately erythematous. Neck:  Supple without lymphadenopathy. Lungs: Clear to auscultation without wheezing, rhonchi or rales. CV: Normal S1, S2 without murmurs. Skin: Warm and dry, without lesions or rashes.  The following portions of the patient's history were reviewed and updated as appropriate: allergies, current medications, past family history, past medical history, past social history, past surgical history and problem list.  Allergies as of 04/25/2019      Reactions   Ace Inhibitors Cough   Penicillins Rash      Medication List       Accurate as of April 25, 2019 10:56 AM. If you have any questions, ask your nurse or doctor.        albuterol 108 (90 Base) MCG/ACT inhaler Commonly known as: VENTOLIN HFA Inhale 1-2 puffs into the lungs every 6 (six) hours as needed for wheezing or shortness of breath.   Calcium 600+D 600-800 MG-UNIT Tabs Generic drug: Calcium Carb-Cholecalciferol   citalopram 10 MG tablet Commonly known as: CELEXA TAKE 1 TABLET (10 MG TOTAL) BY MOUTH DAILY.   denosumab 60 MG/ML Sosy injection Commonly known as: PROLIA Inject 60 mg into the skin every 6 (six) months.   fluticasone 50 MCG/ACT nasal spray Commonly known as: FLONASE Place 2 sprays into both nostrils daily as needed for allergies or rhinitis.   folic acid 357 MCG tablet Commonly known as: FOLVITE Take 400 mcg by mouth daily.   losartan 50 MG tablet Commonly known as: COZAAR TAKE 1 TABLET EVERY DAY   MUCINEX ALLERGY PO Take 600 mg by mouth as needed.   TURMERIC PO Take 1,000 mg by mouth daily. Liquid   VITAMIN D PO Take by mouth daily.  Allergies  Allergen Reactions  . Ace Inhibitors Cough  . Penicillins Rash    I appreciate the opportunity to take part in Ellason's care. Please do not hesitate to contact me with questions.  Sincerely,   R. Edgar Frisk, MD

## 2019-04-25 NOTE — Assessment & Plan Note (Signed)
Stable.  Continue appropriate allergen avoidance measures, nasal steroid spray as needed, guaifenesin (Mucinex) as needed, and nasal saline irrigation as needed.

## 2019-04-25 NOTE — Patient Instructions (Addendum)
COPD Well-controlled.  Continue albuterol HFA, 1 to 2 inhalations every 4-6 hours if needed and 15 minutes prior to exercise.  Subjective and objective measures of pulmonary function will be followed and the treatment plan will be adjusted accordingly.  Other allergic rhinitis Stable.  Continue appropriate allergen avoidance measures, nasal steroid spray as needed, guaifenesin (Mucinex) as needed, and nasal saline irrigation as needed.   Return in about 6 months (around 10/26/2019), or if symptoms worsen or fail to improve.

## 2019-05-05 ENCOUNTER — Other Ambulatory Visit: Payer: Self-pay | Admitting: Internal Medicine

## 2019-06-09 ENCOUNTER — Ambulatory Visit (INDEPENDENT_AMBULATORY_CARE_PROVIDER_SITE_OTHER): Payer: Medicare HMO

## 2019-06-09 ENCOUNTER — Other Ambulatory Visit: Payer: Self-pay

## 2019-06-09 DIAGNOSIS — M810 Age-related osteoporosis without current pathological fracture: Secondary | ICD-10-CM | POA: Diagnosis not present

## 2019-06-09 MED ORDER — DENOSUMAB 60 MG/ML ~~LOC~~ SOSY
60.0000 mg | PREFILLED_SYRINGE | Freq: Once | SUBCUTANEOUS | Status: AC
  Administered 2019-06-09: 60 mg via SUBCUTANEOUS

## 2019-06-09 NOTE — Progress Notes (Signed)
Medical screening examination/treatment/procedure(s) were performed by non-physician practitioner and as supervising physician I was immediately available for consultation/collaboration. I agree with above. James John, MD   

## 2019-06-21 ENCOUNTER — Encounter: Payer: Self-pay | Admitting: Primary Care

## 2019-07-20 ENCOUNTER — Other Ambulatory Visit: Payer: Self-pay | Admitting: Internal Medicine

## 2019-08-12 DIAGNOSIS — Z1231 Encounter for screening mammogram for malignant neoplasm of breast: Secondary | ICD-10-CM | POA: Diagnosis not present

## 2019-08-12 LAB — HM MAMMOGRAPHY

## 2019-10-18 ENCOUNTER — Telehealth: Payer: Self-pay

## 2019-10-18 MED ORDER — ALBUTEROL SULFATE HFA 108 (90 BASE) MCG/ACT IN AERS: 1.0000 | INHALATION_SPRAY | RESPIRATORY_TRACT | 0 refills | Status: DC | PRN

## 2019-10-18 NOTE — Telephone Encounter (Signed)
Patient called requesting a refill on her Ventolin inhaler.  Patient cx her OV with Dr Verlin Fester stating he told her at the last visit in July, if she was doing good she didn't need her appointment in January.  Please Advise.   [facility] [address]

## 2019-10-31 ENCOUNTER — Ambulatory Visit: Payer: Medicare HMO | Admitting: Allergy and Immunology

## 2019-11-17 ENCOUNTER — Telehealth: Payer: Self-pay | Admitting: Internal Medicine

## 2019-11-17 DIAGNOSIS — L989 Disorder of the skin and subcutaneous tissue, unspecified: Secondary | ICD-10-CM

## 2019-11-17 NOTE — Telephone Encounter (Signed)
    Patient requesting appointment/ authorization for Prolia

## 2019-11-17 NOTE — Telephone Encounter (Signed)
Ok this is done 

## 2019-11-17 NOTE — Telephone Encounter (Signed)
   Patient requesting referral for Dermatologist Dr Allyn Kenner to have moles removed

## 2019-11-17 NOTE — Telephone Encounter (Signed)
Please advise 

## 2019-11-18 NOTE — Telephone Encounter (Signed)
Notified pt MD has placed referral../lmb

## 2019-11-18 NOTE — Telephone Encounter (Signed)
Patient will be due Feb 21st or after. Waiting on benefits. Will call patient to schedule once benefits are back. Called patient and informed.

## 2019-12-13 NOTE — Telephone Encounter (Signed)
Insurance requires a PA. Form has been completed. Waiting on approval. She will also need to make sure that she has re-enrolled in [place]. Called patient and left message informing.

## 2019-12-13 NOTE — Telephone Encounter (Signed)
Patient called and spoke with [employer] on 12/09/2019 9:40:35 AM States that she would like to know about the authorization on her prolia. Please advise.

## 2019-12-16 ENCOUNTER — Other Ambulatory Visit: Payer: Self-pay | Admitting: Internal Medicine

## 2019-12-16 DIAGNOSIS — Z1283 Encounter for screening for malignant neoplasm of skin: Secondary | ICD-10-CM | POA: Diagnosis not present

## 2019-12-16 DIAGNOSIS — D225 Melanocytic nevi of trunk: Secondary | ICD-10-CM | POA: Diagnosis not present

## 2020-01-18 ENCOUNTER — Ambulatory Visit (INDEPENDENT_AMBULATORY_CARE_PROVIDER_SITE_OTHER): Payer: Medicare HMO | Admitting: *Deleted

## 2020-01-18 ENCOUNTER — Other Ambulatory Visit: Payer: Self-pay

## 2020-01-18 DIAGNOSIS — M810 Age-related osteoporosis without current pathological fracture: Secondary | ICD-10-CM | POA: Diagnosis not present

## 2020-01-18 MED ORDER — DENOSUMAB 60 MG/ML ~~LOC~~ SOSY
60.0000 mg | PREFILLED_SYRINGE | Freq: Once | SUBCUTANEOUS | Status: AC
  Administered 2020-01-18: 60 mg via SUBCUTANEOUS

## 2020-01-18 NOTE — Progress Notes (Signed)
Pls cosign for prolia.Marland KitchenJohny Chess

## 2020-02-07 DIAGNOSIS — H31091 Other chorioretinal scars, right eye: Secondary | ICD-10-CM | POA: Diagnosis not present

## 2020-02-07 DIAGNOSIS — H43813 Vitreous degeneration, bilateral: Secondary | ICD-10-CM | POA: Diagnosis not present

## 2020-02-07 DIAGNOSIS — H25813 Combined forms of age-related cataract, bilateral: Secondary | ICD-10-CM | POA: Diagnosis not present

## 2020-02-07 DIAGNOSIS — H02834 Dermatochalasis of left upper eyelid: Secondary | ICD-10-CM | POA: Diagnosis not present

## 2020-02-21 ENCOUNTER — Telehealth: Payer: Self-pay | Admitting: Internal Medicine

## 2020-02-21 DIAGNOSIS — R739 Hyperglycemia, unspecified: Secondary | ICD-10-CM

## 2020-02-21 DIAGNOSIS — Z0000 Encounter for general adult medical examination without abnormal findings: Secondary | ICD-10-CM

## 2020-02-21 DIAGNOSIS — E538 Deficiency of other specified B group vitamins: Secondary | ICD-10-CM

## 2020-02-21 DIAGNOSIS — E559 Vitamin D deficiency, unspecified: Secondary | ICD-10-CM

## 2020-02-21 NOTE — Telephone Encounter (Signed)
Patient requesting order for annual labs Appointment for physical 5/7

## 2020-02-21 NOTE — Telephone Encounter (Signed)
Labs done.

## 2020-02-22 ENCOUNTER — Other Ambulatory Visit (INDEPENDENT_AMBULATORY_CARE_PROVIDER_SITE_OTHER): Payer: Medicare HMO

## 2020-02-22 DIAGNOSIS — Z0000 Encounter for general adult medical examination without abnormal findings: Secondary | ICD-10-CM | POA: Diagnosis not present

## 2020-02-22 DIAGNOSIS — R739 Hyperglycemia, unspecified: Secondary | ICD-10-CM

## 2020-02-22 DIAGNOSIS — E559 Vitamin D deficiency, unspecified: Secondary | ICD-10-CM | POA: Diagnosis not present

## 2020-02-22 DIAGNOSIS — E538 Deficiency of other specified B group vitamins: Secondary | ICD-10-CM | POA: Diagnosis not present

## 2020-02-22 LAB — CBC WITH DIFFERENTIAL/PLATELET
Basophils Absolute: 0.1 10*3/uL (ref 0.0–0.1)
Basophils Relative: 0.9 % (ref 0.0–3.0)
Eosinophils Absolute: 0.2 10*3/uL (ref 0.0–0.7)
Eosinophils Relative: 2.3 % (ref 0.0–5.0)
HCT: 45.5 % (ref 36.0–46.0)
Hemoglobin: 15.6 g/dL — ABNORMAL HIGH (ref 12.0–15.0)
Lymphocytes Relative: 31.3 % (ref 12.0–46.0)
Lymphs Abs: 2.1 10*3/uL (ref 0.7–4.0)
MCHC: 34.3 g/dL (ref 30.0–36.0)
MCV: 96.9 fl (ref 78.0–100.0)
Monocytes Absolute: 0.8 10*3/uL (ref 0.1–1.0)
Monocytes Relative: 11.2 % (ref 3.0–12.0)
Neutro Abs: 3.6 10*3/uL (ref 1.4–7.7)
Neutrophils Relative %: 54.3 % (ref 43.0–77.0)
Platelets: 286 10*3/uL (ref 150.0–400.0)
RBC: 4.7 Mil/uL (ref 3.87–5.11)
RDW: 13.2 % (ref 11.5–15.5)
WBC: 6.7 10*3/uL (ref 4.0–10.5)

## 2020-02-22 LAB — BASIC METABOLIC PANEL
BUN: 15 mg/dL (ref 6–23)
CO2: 27 mEq/L (ref 19–32)
Calcium: 9.5 mg/dL (ref 8.4–10.5)
Chloride: 104 mEq/L (ref 96–112)
Creatinine, Ser: 0.67 mg/dL (ref 0.40–1.20)
GFR: 87.06 mL/min (ref >60.00)
Glucose, Bld: 94 mg/dL (ref 70–99)
Potassium: 4.6 mEq/L (ref 3.5–5.1)
Sodium: 139 mEq/L (ref 135–145)

## 2020-02-22 LAB — VITAMIN B12: Vitamin B-12: 201 pg/mL — ABNORMAL LOW (ref 211–911)

## 2020-02-22 LAB — HEPATIC FUNCTION PANEL
ALT: 15 U/L (ref 0–35)
AST: 18 U/L (ref 0–37)
Albumin: 4.2 g/dL (ref 3.5–5.2)
Alkaline Phosphatase: 60 U/L (ref 39–117)
Bilirubin, Direct: 0.1 mg/dL (ref 0.0–0.3)
Total Bilirubin: 0.6 mg/dL (ref 0.2–1.2)
Total Protein: 7.3 g/dL (ref 6.0–8.3)

## 2020-02-22 LAB — URINALYSIS, ROUTINE W REFLEX MICROSCOPIC
Ketones, ur: NEGATIVE
Leukocytes,Ua: NEGATIVE
Nitrite: NEGATIVE
RBC / HPF: NONE SEEN (ref 0–?)
Specific Gravity, Urine: 1.025 (ref 1.000–1.030)
Total Protein, Urine: NEGATIVE
Urine Glucose: NEGATIVE
Urobilinogen, UA: 0.2 (ref 0.0–1.0)
pH: 5.5 (ref 5.0–8.0)

## 2020-02-22 LAB — LIPID PANEL
Cholesterol: 191 mg/dL (ref 0–200)
HDL: 85.3 mg/dL (ref >39.00)
LDL Cholesterol: 96 mg/dL (ref 0–99)
NonHDL: 105.71
Total CHOL/HDL Ratio: 2
Triglycerides: 50 mg/dL (ref 0.0–149.0)
VLDL: 10 mg/dL (ref 0.0–40.0)

## 2020-02-22 LAB — VITAMIN D 25 HYDROXY (VIT D DEFICIENCY, FRACTURES): VITD: 73.86 ng/mL (ref 30.00–100.00)

## 2020-02-22 LAB — HEMOGLOBIN A1C: Hgb A1c MFr Bld: 5.1 % (ref 4.6–6.5)

## 2020-02-22 LAB — TSH: TSH: 1.09 u[IU]/mL (ref 0.35–4.50)

## 2020-02-22 NOTE — Telephone Encounter (Signed)
Pt came in to day for labs closing encounter../l,mb

## 2020-02-24 ENCOUNTER — Encounter: Payer: Self-pay | Admitting: Internal Medicine

## 2020-02-24 ENCOUNTER — Other Ambulatory Visit: Payer: Self-pay

## 2020-02-24 ENCOUNTER — Ambulatory Visit (INDEPENDENT_AMBULATORY_CARE_PROVIDER_SITE_OTHER): Payer: Medicare HMO | Admitting: Internal Medicine

## 2020-02-24 VITALS — BP 160/100 | HR 85 | Temp 98.2°F | Ht 63.0 in | Wt 195.0 lb

## 2020-02-24 DIAGNOSIS — G250 Essential tremor: Secondary | ICD-10-CM | POA: Diagnosis not present

## 2020-02-24 DIAGNOSIS — Z0000 Encounter for general adult medical examination without abnormal findings: Secondary | ICD-10-CM | POA: Diagnosis not present

## 2020-02-24 DIAGNOSIS — R7302 Impaired glucose tolerance (oral): Secondary | ICD-10-CM

## 2020-02-24 DIAGNOSIS — E538 Deficiency of other specified B group vitamins: Secondary | ICD-10-CM

## 2020-02-24 MED ORDER — VITAMIN B-12 1000 MCG PO TABS: 1000.0000 ug | ORAL_TABLET | Freq: Every day | ORAL | 3 refills | Status: DC

## 2020-02-24 MED ORDER — METOPROLOL SUCCINATE ER 25 MG PO TB24
25.0000 mg | ORAL_TABLET | Freq: Every day | ORAL | 3 refills | Status: DC
Start: 2020-02-24 — End: 2021-03-20

## 2020-02-24 MED ORDER — LOSARTAN POTASSIUM 50 MG PO TABS: 50.0000 mg | ORAL_TABLET | Freq: Every day | ORAL | 3 refills | Status: DC

## 2020-02-24 NOTE — Patient Instructions (Signed)
Please take all new medication as prescribed - the toprol XL 25 mg for tremor, and the B12 supplement  Please continue all other medications as before, and refills have been done if requested.  Please have the pharmacy call with any other refills you may need.  Please continue your efforts at being more active, low cholesterol diet, and weight control.  You are otherwise up to date with prevention measures today.  Please keep your appointments with your specialists as you may have planned  Please make an Appointment to return for your 1 year visit, or sooner if needed, with Lab testing by Appointment as well, to be done about 3-5 days before at the [facility] (so this is for TWO appointments - please see the scheduling desk as you leave)

## 2020-02-24 NOTE — Progress Notes (Signed)
Subjective:    Patient ID: Crystal [name], female    DOB: [birth year], 69 y.o.   MRN: [id number]  HPI  Here for wellness and f/u;  Overall doing ok;  Pt denies Chest pain, worsening SOB, DOE, wheezing, orthopnea, PND, worsening LE edema, palpitations, dizziness or syncope.  Pt denies neurological change such as new headache, facial or extremity weakness, but does have worsening head tremor that has become more noticable.   Pt denies polydipsia, polyuria, or low sugar symptoms. Pt states overall good compliance with treatment and medications, good tolerability, and has been trying to follow appropriate diet.  Pt denies worsening depressive symptoms, suicidal ideation or panic. No fever, night sweats, wt loss, loss of appetite, or other constitutional symptoms.  Pt states good ability with ADL's, has low fall risk, home safety reviewed and adequate, no other significant changes in hearing or vision, and only occasionally active with exercise.  BP at home < 140/90, to have cataracts soon, then right droopy eyelid repair.  Also dx with OSA dec 2019 with oral device tx, which worked ok on f/u testing, and symptomatically much improved.  Now weaned off celexa and doing well.   Past Medical History:  Diagnosis Date  . ALLERGIC RHINITIS   . Allergy   . ANXIETY    no medations needed  . Arthritis   . Asthma   . Bronchitis   . Cataract    no surgery  . COLONIC POLYPS, HX OF   . COPD    pt unsure of this dx  . Cough   . DEPRESSION   . Fatty liver   . GERD   . GLUCOSE INTOLERANCE   . HYPERTENSION   . IBS   . Impaired glucose tolerance   . Obstructive sleep apnea 06/25/2018  . OSTEOPENIA   . Osteopenia   . Osteoporosis 07/30/2017  . PERIPHERAL EDEMA   . Pneumonia, organism unspecified(486)   . Sleep apnea    Oral Aparatus  . TOBACCO USE DISORDER/SMOKER-SMOKING CESSATION DISCUSSED    Past Surgical History:  Procedure Laterality Date  . birthmark removed     from back as a child  . bladder  mesh     2012  . CESAREAN SECTION     x2  . CHOLECYSTECTOMY    . COLONOSCOPY    . WISDOM TOOTH EXTRACTION      reports that she has been smoking cigarettes. She has a 35.00 pack-year smoking history. She has never used smokeless tobacco. She reports current alcohol use of about 21.0 standard drinks of alcohol per week. She reports that she does not use drugs. family history includes Allergic rhinitis in her sister; Anxiety disorder in an other family member; Bladder Cancer in her father; Breast cancer in her sister; Breast cancer (age of onset: 33) in her sister; Cervical cancer in her sister; Colon cancer in her father and paternal grandfather; Diabetes in an other family member; Heart attack (age of onset: 25) in her maternal grandmother; Heart attack (age of onset: 77) in her maternal uncle; Hyperlipidemia in an other family member; Hypothyroidism in an other family member; Melanoma in her paternal grandfather; Other in her mother; Skin cancer in her brother, father, and mother; Thyroid cancer (age of onset: 98) in her paternal grandmother. Allergies  Allergen Reactions  . Ace Inhibitors Cough  . Penicillins Rash   Current Outpatient Medications on File Prior to Visit  Medication Sig Dispense Refill  . albuterol (VENTOLIN HFA) 108 (90 Base) MCG/ACT  inhaler Inhale 1-2 puffs into the lungs every 4 (four) hours as needed. 18 g 0  . Calcium Carb-Cholecalciferol (CALCIUM 600+D) 600-800 MG-UNIT TABS     . Cholecalciferol (VITAMIN D PO) Take by mouth daily.    Marland Kitchen denosumab (PROLIA) 60 MG/ML SOSY injection Inject 60 mg into the skin every 6 (six) months.    . Fexofenadine HCl (MUCINEX ALLERGY PO) Take 600 mg by mouth as needed.    . fluticasone (FLONASE) 50 MCG/ACT nasal spray Place 2 sprays into both nostrils daily as needed for allergies or rhinitis.    . folic acid (FOLVITE) 741 MCG tablet Take 400 mcg by mouth daily.     No current facility-administered medications on file prior to visit.    Review of Systems All otherwise neg per pt     Objective:   Physical Exam BP (!) 160/100 (BP Location: Left Arm, Patient Position: Sitting, Cuff Size: Large)   Pulse 85   Temp 98.2 F (36.8 C) (Oral)   Ht 5' 3"  (1.6 m)   Wt 195 lb (88.5 kg)   SpO2 98%   BMI 34.54 kg/m  VS noted,  Constitutional: Pt appears in NAD HENT: Head: NCAT.  Right Ear: External ear normal.  Left Ear: External ear normal.  Eyes: . Pupils are equal, round, and reactive to light. Conjunctivae and EOM are normal Nose: without d/c or deformity Neck: Neck supple. Gross normal ROM Cardiovascular: Normal rate and regular rhythm.   Pulmonary/Chest: Effort normal and breath sounds without rales or wheezing.  Abd:  Soft, NT, ND, + BS, no organomegaly Neurological: Pt is alert. At baseline orientation, motor grossly intact Skin: Skin is warm. No rashes, other new lesions, no LE edema Psychiatric: Pt behavior is normal without agitation  All otherwise neg per pt Lab Results  Component Value Date   WBC 6.7 02/22/2020   HGB 15.6 (H) 02/22/2020   HCT 45.5 02/22/2020   PLT 286.0 02/22/2020   GLUCOSE 94 02/22/2020   CHOL 191 02/22/2020   TRIG 50.0 02/22/2020   HDL 85.30 02/22/2020   LDLDIRECT 82.2 07/01/2013   LDLCALC 96 02/22/2020   ALT 15 02/22/2020   AST 18 02/22/2020   NA 139 02/22/2020   K 4.6 02/22/2020   CL 104 02/22/2020   CREATININE 0.67 02/22/2020   BUN 15 02/22/2020   CO2 27 02/22/2020   TSH 1.09 02/22/2020   INR 0.94 04/24/2011   HGBA1C 5.1 02/22/2020        Assessment & Plan:

## 2020-02-25 ENCOUNTER — Encounter: Payer: Self-pay | Admitting: Internal Medicine

## 2020-02-25 DIAGNOSIS — G250 Essential tremor: Secondary | ICD-10-CM | POA: Insufficient documentation

## 2020-02-25 NOTE — Assessment & Plan Note (Signed)

## 2020-02-25 NOTE — Assessment & Plan Note (Signed)
stable overall by history and exam, recent data reviewed with pt, and pt to continue medical treatment as before,  to f/u any worsening symptoms or concerns  

## 2020-02-25 NOTE — Assessment & Plan Note (Addendum)
For low dose BB

## 2020-02-27 ENCOUNTER — Encounter: Payer: Self-pay | Admitting: Family Medicine

## 2020-02-27 ENCOUNTER — Ambulatory Visit (INDEPENDENT_AMBULATORY_CARE_PROVIDER_SITE_OTHER): Payer: Medicare HMO

## 2020-02-27 ENCOUNTER — Ambulatory Visit: Payer: Medicare HMO | Admitting: Family Medicine

## 2020-02-27 ENCOUNTER — Other Ambulatory Visit: Payer: Self-pay

## 2020-02-27 VITALS — BP 124/80 | HR 91 | Ht 63.0 in | Wt 195.0 lb

## 2020-02-27 DIAGNOSIS — M170 Bilateral primary osteoarthritis of knee: Secondary | ICD-10-CM

## 2020-02-27 DIAGNOSIS — M7062 Trochanteric bursitis, left hip: Secondary | ICD-10-CM | POA: Insufficient documentation

## 2020-02-27 DIAGNOSIS — M7061 Trochanteric bursitis, right hip: Secondary | ICD-10-CM

## 2020-02-27 DIAGNOSIS — M25552 Pain in left hip: Secondary | ICD-10-CM

## 2020-02-27 DIAGNOSIS — M1612 Unilateral primary osteoarthritis, left hip: Secondary | ICD-10-CM | POA: Diagnosis not present

## 2020-02-27 NOTE — Patient Instructions (Addendum)
Good to see you We will get the gel approved Hip xray today See me again in 4-6 weeks

## 2020-02-27 NOTE — Progress Notes (Signed)
[facility] Phone: [phone] Subjective:    I'm seeing this patient by the request  of:  Biagio Borg, MD  CC: Bilateral knee pain follow-up  [id number]   03/31/2018 Finished Visco supplementation today.  Continue all other conservative therapy.  We discussed icing regimen and home exercises.  We discussed which activities of doing which wants to avoid.  Follow-up again as needed  Update 02/27/2020 Crystal [name] is a 69 y.o. female coming in with complaint of bilateral knee pain and left hip pain. Patient states that she has pain over the rectus femorus and hip flexor throughout the day. Pain also affects the left glute. Suffers muscle spasm if she bends over.   Knee pain increasing over past 2 weeks.  Known arthritic changes.  Has responded well to the injections previously.  Feels like this is very similar to what it was previously.  Does not know which one came first either the hip or the knees this time.  Both can wake her up at night.      Past Medical History:  Diagnosis Date  . ALLERGIC RHINITIS   . Allergy   . ANXIETY    no medations needed  . Arthritis   . Asthma   . Bronchitis   . Cataract    no surgery  . COLONIC POLYPS, HX OF   . COPD    pt unsure of this dx  . Cough   . DEPRESSION   . Fatty liver   . GERD   . GLUCOSE INTOLERANCE   . HYPERTENSION   . IBS   . Impaired glucose tolerance   . Obstructive sleep apnea 06/25/2018  . OSTEOPENIA   . Osteopenia   . Osteoporosis 07/30/2017  . PERIPHERAL EDEMA   . Pneumonia, organism unspecified(486)   . Sleep apnea    Oral Aparatus  . TOBACCO USE DISORDER/SMOKER-SMOKING CESSATION DISCUSSED    Past Surgical History:  Procedure Laterality Date  . birthmark removed     from back as a child  . bladder mesh     2012  . CESAREAN SECTION     x2  . CHOLECYSTECTOMY    . COLONOSCOPY    . WISDOM TOOTH EXTRACTION     Social History    Socioeconomic History  . Marital status: Widowed    Spouse name: Not on file  . Number of children: 2  . Years of education: Not on file  . Highest education level: Not on file  Occupational History  . Occupation: Occupational psychologist industries, Museum/gallery curator for [employer]  Tobacco Use  . Smoking status: Current Every Day Smoker    Packs/day: 1.00    Years: 35.00    Pack years: 35.00    Types: Cigarettes  . Smokeless tobacco: Never Used  Substance and Sexual Activity  . Alcohol use: Yes    Alcohol/week: 21.0 standard drinks    Types: 7 Glasses of wine, 14 Standard drinks or equivalent per week  . Drug use: No  . Sexual activity: Not on file  Other Topics Concern  . Not on file  Social History Narrative  . Not on file   Social Determinants of Health   Financial Resource Strain:   . Difficulty of Paying Living Expenses:   Food Insecurity:   . Worried About Charity fundraiser in the Last Year:   . [place] in the Last Year:  Transportation Needs:   . Film/video editor (Medical):   Marland Kitchen Lack of Transportation (Non-Medical):   Physical Activity:   . Days of Exercise per Week:   . Minutes of Exercise per Session:   Stress:   . Feeling of Stress :   Social Connections:   . Frequency of Communication with Friends and Family:   . Frequency of Social Gatherings with Friends and Family:   . Attends Religious Services:   . Active Member of Clubs or Organizations:   . Attends Archivist Meetings:   Marland Kitchen Marital Status:    Allergies  Allergen Reactions  . Ace Inhibitors Cough  . Penicillins Rash   Family History  Problem Relation Age of Onset  . Breast cancer Sister 40       +lump and tamoxifen; eventually had bilateral mastectomies  . Allergic rhinitis Sister   . Colon cancer Father        dx. >64  . Bladder Cancer Father        dx. 53-79; not a smoker  . Skin cancer Father        basal cell carcinoma  . Colon cancer Paternal Grandfather         unspecified age  . Melanoma Paternal Grandfather        unspecified age  . Skin cancer Mother        NOS; dx. 68s  . Other Mother        hx of hysterectomy in her early 46s for fibroid cysts  . Heart attack Maternal Uncle 67  . Heart attack Maternal Grandmother 62  . Thyroid cancer Paternal Grandmother 88  . Breast cancer Sister        dx. early 50s; s/p BL mastectomies  . Cervical cancer Sister        dx. late 61s  . Skin cancer Brother        NOS  . Anxiety disorder Other   . Hypothyroidism Other   . Hyperlipidemia Other   . Diabetes Other   . Esophageal cancer Neg Hx   . Stomach cancer Neg Hx   . Rectal cancer Neg Hx     Current Outpatient Medications (Endocrine & Metabolic):  .  denosumab (PROLIA) 60 MG/ML SOSY injection, Inject 60 mg into the skin every 6 (six) months.  Current Outpatient Medications (Cardiovascular):  .  losartan (COZAAR) 50 MG tablet, Take 1 tablet (50 mg total) by mouth daily. .  metoprolol succinate (TOPROL-XL) 25 MG 24 hr tablet, Take 1 tablet (25 mg total) by mouth daily.  Current Outpatient Medications (Respiratory):  .  albuterol (VENTOLIN HFA) 108 (90 Base) MCG/ACT inhaler, Inhale 1-2 puffs into the lungs every 4 (four) hours as needed. Marland Kitchen  Fexofenadine HCl (MUCINEX ALLERGY PO), Take 600 mg by mouth as needed. .  fluticasone (FLONASE) 50 MCG/ACT nasal spray, Place 2 sprays into both nostrils daily as needed for allergies or rhinitis.   Current Outpatient Medications (Hematological):  .  folic acid (FOLVITE) 263 MCG tablet, Take 400 mcg by mouth daily. .  vitamin B-12 (CYANOCOBALAMIN) 1000 MCG tablet, Take 1 tablet (1,000 mcg total) by mouth daily.  Current Outpatient Medications (Other):  Marland Kitchen  Calcium Carb-Cholecalciferol (CALCIUM 600+D) 600-800 MG-UNIT TABS,  .  Cholecalciferol (VITAMIN D PO), Take by mouth daily.   Reviewed prior external information including notes and imaging from  primary care provider As well as notes that were  available from care everywhere and other healthcare systems.  Past medical  history, social, surgical and family history all reviewed in electronic medical record.  No pertanent information unless stated regarding to the chief complaint.   Review of Systems:  No headache, visual changes, nausea, vomiting, diarrhea, constipation, dizziness, abdominal pain, skin rash, fevers, chills, night sweats, weight loss, swollen lymph nodes, body aches, joint swelling, chest pain, shortness of breath, mood changes. POSITIVE muscle aches  Objective  There were no vitals taken for this visit.   General: No apparent distress alert and oriented x3 mood and affect normal, dressed appropriately.  Tremor noted at the head   HEENT: Pupils equal, extraocular movements intact  Respiratory: Patient's speak in full sentences and does not appear short of breath  Cardiovascular: Trace lower extremity edema, non tender, no erythema  Neuro: Cranial nerves II through XII are intact, neurovascularly intact in all extremities with 2+ DTRs and 2+ pulses.  Gait antalgic  Knee: Bilateral valgus deformity noted. Large thigh to calf ratio.  Tender to palpation over medial and PF joint line.  ROM full in flexion and extension and lower leg rotation. instability with valgus force.  painful patellar compression. Patellar glide with moderate crepitus. Patellar and quadriceps tendons unremarkable. Hamstring and quadriceps strength is normal.   Severe tenderness to palpation over the lateral hip noted as well.  Decreased range of motion also noted with internal range of motion of the hip.  After verbal consent patient was prepped in sterile fashion with alcohol swabs. Ethyl chloride used patient was injected with a 22-gauge 3 inch needle into the RIGHT lateral hip in the greater trochanteric area under ultrasound guidance. Picture was taken. Patient had 4 cc of 0.5% Marcaine and 1 cc of Kenalog 40 mg/dL injected. Patient  tolerated the procedure well and no blood loss. Pain completely resolved after injection stating proper placement. Post injection instructions given.    Impression and Recommendations:     This case required medical decision making of moderate complexity. The above documentation has been reviewed and is accurate and complete Crystal Pulley, DO       Note: This dictation was prepared with Dragon dictation along with smaller phrase technology. Any transcriptional errors that result from this process are unintentional.

## 2020-02-27 NOTE — Assessment & Plan Note (Signed)
With patient symptoms consistent with more of the lateral greater trochanteric bursitis we attempted an injection.  Hopefully therapeutic but also diagnostic.  If continuing to have worsening symptoms will consider further evaluation of patient's left hip.  X-rays are pending.  Could need a possible intra-articular injection.  Follow-up again in 4 to 8 weeks

## 2020-02-27 NOTE — Assessment & Plan Note (Signed)
Chronic problem worsening symptoms.  Discussed icing regimen and home exercises, discussed topical anti-inflammatories.  Patient responds well to the injections we have given her previously.  Hopefully will do well with this and will consider viscosupplementation.  Follow-up again in 4 to 8 weeks

## 2020-03-07 DIAGNOSIS — H02411 Mechanical ptosis of right eyelid: Secondary | ICD-10-CM | POA: Diagnosis not present

## 2020-03-07 DIAGNOSIS — H25813 Combined forms of age-related cataract, bilateral: Secondary | ICD-10-CM | POA: Diagnosis not present

## 2020-03-07 DIAGNOSIS — H02834 Dermatochalasis of left upper eyelid: Secondary | ICD-10-CM | POA: Diagnosis not present

## 2020-03-07 DIAGNOSIS — H31091 Other chorioretinal scars, right eye: Secondary | ICD-10-CM | POA: Diagnosis not present

## 2020-04-04 ENCOUNTER — Ambulatory Visit: Payer: Medicare HMO | Admitting: Family Medicine

## 2020-04-04 ENCOUNTER — Other Ambulatory Visit: Payer: Self-pay

## 2020-04-04 ENCOUNTER — Encounter: Payer: Self-pay | Admitting: Family Medicine

## 2020-04-04 VITALS — BP 132/102 | HR 107 | Ht 63.0 in | Wt 195.0 lb

## 2020-04-04 DIAGNOSIS — M7062 Trochanteric bursitis, left hip: Secondary | ICD-10-CM

## 2020-04-04 DIAGNOSIS — M170 Bilateral primary osteoarthritis of knee: Secondary | ICD-10-CM

## 2020-04-04 DIAGNOSIS — M1612 Unilateral primary osteoarthritis, left hip: Secondary | ICD-10-CM

## 2020-04-04 DIAGNOSIS — F411 Generalized anxiety disorder: Secondary | ICD-10-CM

## 2020-04-04 NOTE — Progress Notes (Signed)
[place] Phone: [phone] Subjective:   Crystal [name], am serving as a scribe for Dr. Hulan Saas. This visit occurred during the SARS-CoV-2 public health emergency.  Safety protocols were in place, including screening questions prior to the visit, additional usage of staff PPE, and extensive cleaning of exam room while observing appropriate contact time as indicated for disinfecting solutions.  I'm seeing this patient by the request  of:  Biagio Borg, MD  CC: Knee pain, left hip pain  FKC:[id number]   02/27/2020 With patient symptoms consistent with more of the lateral greater trochanteric bursitis we attempted an injection.  Hopefully therapeutic but also diagnostic.  If continuing to have worsening symptoms will consider further evaluation of patient's left hip.  X-rays are pending.  Could need a possible intra-articular injection.  Follow-up again in 4 to 8 weeks  Chronic problem worsening symptoms.  Discussed icing regimen and home exercises, discussed topical anti-inflammatories.  Patient responds well to the injections we have given her previously.  Hopefully will do well with this and will consider viscosupplementation.  Follow-up again in 4 to 8 weeks  Update 04/04/2020 Crystal [name] is a 69 y.o. female coming in with complaint of left hip and bilateral knee pain. Monovisc approved.Patient states that the injection helped for 1 week. Is having catching sensation in hip and increase in pain.  Patient states that it did help for approximately 2 years and would like to have it done again if possible.  Feels the steroids did not last long enough  R knee pain > Left. Did go to beach for past week, walking on uneven sand and eating more sugar she states. Using ice for pain.   Left hip continues to give her trouble.  Moderate groin pain with lateral pain.  Patient did have x-rays showing severe arthritic  changes noted.     Past Medical History:  Diagnosis Date  . ALLERGIC RHINITIS   . Allergy   . ANXIETY    [name] medations needed  . Arthritis   . Asthma   . Bronchitis   . Cataract    [name] surgery  . COLONIC POLYPS, HX OF   . COPD    pt unsure of this dx  . Cough   . DEPRESSION   . Fatty liver   . GERD   . GLUCOSE INTOLERANCE   . HYPERTENSION   . IBS   . Impaired glucose tolerance   . Obstructive sleep apnea 06/25/2018  . OSTEOPENIA   . Osteopenia   . Osteoporosis 07/30/2017  . PERIPHERAL EDEMA   . Pneumonia, organism unspecified(486)   . Sleep apnea    Oral Aparatus  . TOBACCO USE DISORDER/SMOKER-SMOKING CESSATION DISCUSSED    Past Surgical History:  Procedure Laterality Date  . birthmark removed     from back as a child  . bladder mesh     2012  . CESAREAN SECTION     x2  . CHOLECYSTECTOMY    . COLONOSCOPY    . WISDOM TOOTH EXTRACTION     Social History   Socioeconomic History  . Marital status: Widowed    Spouse name: Not on file  . Number of children: 2  . Years of education: Not on file  . Highest education level: Not on file  Occupational History  . Occupation: Occupational psychologist industries, Museum/gallery curator for [employer]  Tobacco Use  . Smoking status: Current Every Day  Smoker    Packs/day: 1.00    Years: 35.00    Pack years: 35.00    Types: Cigarettes  . Smokeless tobacco: Never Used  Vaping Use  . Vaping Use: Never used  Substance and Sexual Activity  . Alcohol use: Yes    Alcohol/week: 21.0 standard drinks    Types: 7 Glasses of wine, 14 Standard drinks or equivalent per week  . Drug use: [name]  . Sexual activity: Not on file  Other Topics Concern  . Not on file  Social History Narrative  . Not on file   Social Determinants of Health   Financial Resource Strain:   . Difficulty of Paying Living Expenses:   Food Insecurity:   . Worried About Charity fundraiser in the Last Year:   . Arboriculturist in the Last Year:   Transportation  Needs:   . Film/video editor (Medical):   Marland Kitchen Lack of Transportation (Non-Medical):   Physical Activity:   . Days of Exercise per Week:   . Minutes of Exercise per Session:   Stress:   . Feeling of Stress :   Social Connections:   . Frequency of Communication with Friends and Family:   . Frequency of Social Gatherings with Friends and Family:   . Attends Religious Services:   . Active Member of Clubs or Organizations:   . Attends Archivist Meetings:   Marland Kitchen Marital Status:    Allergies  Allergen Reactions  . Ace Inhibitors Cough  . Penicillins Rash   Family History  Problem Relation Age of Onset  . Breast cancer Sister 40       +lump and tamoxifen; eventually had bilateral mastectomies  . Allergic rhinitis Sister   . Colon cancer Father        dx. >62  . Bladder Cancer Father        dx. 52-79; not a smoker  . Skin cancer Father        basal cell carcinoma  . Colon cancer Paternal Grandfather        unspecified age  . Melanoma Paternal Grandfather        unspecified age  . Skin cancer Mother        NOS; dx. 23s  . Other Mother        hx of hysterectomy in her early 67s for fibroid cysts  . Heart attack Maternal Uncle 67  . Heart attack Maternal Grandmother 62  . Thyroid cancer Paternal Grandmother 88  . Breast cancer Sister        dx. early 62s; s/p BL mastectomies  . Cervical cancer Sister        dx. late 70s  . Skin cancer Brother        NOS  . Anxiety disorder Other   . Hypothyroidism Other   . Hyperlipidemia Other   . Diabetes Other   . Esophageal cancer Neg Hx   . Stomach cancer Neg Hx   . Rectal cancer Neg Hx     Current Outpatient Medications (Endocrine & Metabolic):  .  denosumab (PROLIA) 60 MG/ML SOSY injection, Inject 60 mg into the skin every 6 (six) months.  Current Outpatient Medications (Cardiovascular):  .  losartan (COZAAR) 50 MG tablet, Take 1 tablet (50 mg total) by mouth daily. .  metoprolol succinate (TOPROL-XL) 25 MG 24 hr  tablet, Take 1 tablet (25 mg total) by mouth daily.  Current Outpatient Medications (Respiratory):  .  albuterol (VENTOLIN HFA)  108 (90 Base) MCG/ACT inhaler, Inhale 1-2 puffs into the lungs every 4 (four) hours as needed. Marland Kitchen  Fexofenadine HCl (MUCINEX ALLERGY PO), Take 600 mg by mouth as needed. .  fluticasone (FLONASE) 50 MCG/ACT nasal spray, Place 2 sprays into both nostrils daily as needed for allergies or rhinitis.   Current Outpatient Medications (Hematological):  .  folic acid (FOLVITE) 883 MCG tablet, Take 400 mcg by mouth daily. .  vitamin B-12 (CYANOCOBALAMIN) 1000 MCG tablet, Take 1 tablet (1,000 mcg total) by mouth daily.  Current Outpatient Medications (Other):  Marland Kitchen  Calcium Carb-Cholecalciferol (CALCIUM 600+D) 600-800 MG-UNIT TABS,  .  Cholecalciferol (VITAMIN D PO), Take by mouth daily.   Reviewed prior external information including notes and imaging from  primary care provider As well as notes that were available from care everywhere and other healthcare systems.  Past medical history, social, surgical and family history all reviewed in electronic medical record.  [name] pertanent information unless stated regarding to the chief complaint.   Review of Systems:  [name] headache, visual changes, nausea, vomiting, diarrhea, constipation, dizziness, abdominal pain, skin rash, fevers, chills, night sweats, weight loss, swollen lymph nodes, body aches, joint swelling, chest pain, shortness of breath, mood changes. POSITIVE muscle aches  Objective  Blood pressure (!) 132/102, pulse (!) 107, height 5' 3"  (1.6 m), weight 195 lb (88.5 kg), SpO2 99 %.   General: [name] apparent distress alert and oriented x3 mood and affect normal, dressed appropriately.  HEENT: Pupils equal, extraocular movements intact  Respiratory: Patient's speak in full sentences and does not appear short of breath  Cardiovascular: [name] lower extremity edema, non tender, [name] erythema  Neuro: Cranial nerves II through XII  are intact, neurovascularly intact in all extremities with 2+ DTRs and 2+ pulses.  Gait nearly antalgic  Left hip exam has 0 degrees of internal rotation with increasing pain in the groin.  4-5 strength of the hip flexion noted.  External rotation of 110 degrees as well.  Knee: Bilateral valgus deformity noted. Large thigh to calf ratio.  Tender to palpation over medial and PF joint line.  ROM full in flexion and extension and lower leg rotation. instability with valgus force.  painful patellar compression. Patellar glide with moderate crepitus. Patellar and quadriceps tendons unremarkable. Hamstring and quadriceps strength is normal.  After informed written and verbal consent, patient was seated on exam table. Right knee was prepped with alcohol swab and utilizing anterolateral approach, patient's right knee space was injected with 48 mg per 3 mL of Monovisc (sodium hyaluronate) in a prefilled syringe was injected easily into the knee through a 22-gauge needle..Patient tolerated the procedure well without immediate complications.  After informed written and verbal consent, patient was seated on exam table. Left knee was prepped with alcohol swab and utilizing anterolateral approach, patient's left knee space was injected with 48 mg per 3 mL of Monovisc (sodium hyaluronate) in a prefilled syringe was injected easily into the knee through a 22-gauge needle..Patient tolerated the procedure well without immediate complications.    Impression and Recommendations:     The above documentation has been reviewed and is accurate and complete Lyndal Pulley, DO       Note: This dictation was prepared with Dragon dictation along with smaller phrase technology. Any transcriptional errors that result from this process are unintentional.

## 2020-04-04 NOTE — Assessment & Plan Note (Signed)
Severe arthritis of the left hip.  Does need replacement.  Referred to orthopedic surgery to discuss

## 2020-04-04 NOTE — Patient Instructions (Signed)
Dr. Latanya Maudlin at [facility] Let me know if gel doesn't work after 6 months Read into PRP

## 2020-04-04 NOTE — Assessment & Plan Note (Signed)
Viscosupplementation given today.  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  Strong family history of this.  Discussed icing regimen and home exercise, which activities to doing which wants to avoid.  Patient is to increase activity slowly over the course the next several weeks.  Hopefully patient will notice improvements again as she has previously with the viscosupplementation.  Follow-up again 8 to 12 weeks

## 2020-04-25 HISTORY — PX: MOUTH SURGERY: SHX715

## 2020-05-04 DIAGNOSIS — M1612 Unilateral primary osteoarthritis, left hip: Secondary | ICD-10-CM | POA: Diagnosis not present

## 2020-05-09 ENCOUNTER — Encounter: Payer: Self-pay | Admitting: Internal Medicine

## 2020-05-09 ENCOUNTER — Ambulatory Visit (INDEPENDENT_AMBULATORY_CARE_PROVIDER_SITE_OTHER): Payer: Medicare HMO | Admitting: Internal Medicine

## 2020-05-09 ENCOUNTER — Other Ambulatory Visit: Payer: Self-pay

## 2020-05-09 VITALS — BP 140/80 | HR 95 | Temp 98.8°F | Ht 63.0 in | Wt 196.0 lb

## 2020-05-09 DIAGNOSIS — J039 Acute tonsillitis, unspecified: Secondary | ICD-10-CM | POA: Diagnosis not present

## 2020-05-09 DIAGNOSIS — J449 Chronic obstructive pulmonary disease, unspecified: Secondary | ICD-10-CM

## 2020-05-09 DIAGNOSIS — R7302 Impaired glucose tolerance (oral): Secondary | ICD-10-CM

## 2020-05-09 DIAGNOSIS — I10 Essential (primary) hypertension: Secondary | ICD-10-CM | POA: Diagnosis not present

## 2020-05-09 DIAGNOSIS — L989 Disorder of the skin and subcutaneous tissue, unspecified: Secondary | ICD-10-CM

## 2020-05-09 MED ORDER — AZITHROMYCIN 250 MG PO TABS: ORAL_TABLET | ORAL | 1 refills | Status: DC

## 2020-05-09 NOTE — Patient Instructions (Signed)
Please take all new medication as prescribed - the antibiotic  Please call in 10 days for ENT referral if not improved  Please continue all other medications as before, and refills have been done if requested.  Please have the pharmacy call with any other refills you may need.  Please keep your appointments with your specialists as you may have planned  - left hip surgury, and the colonoscopy  You will be contacted regarding the referral for: Dermatology at [facility] as you mentioned

## 2020-05-09 NOTE — Assessment & Plan Note (Signed)
stable overall by history and exam, recent data reviewed with pt, and pt to continue medical treatment as before,  to f/u any worsening symptoms or concerns  

## 2020-05-09 NOTE — Assessment & Plan Note (Addendum)
Mild to mod, for antibx course,  to f/u any worsening symptoms or concerns, consider ent if not improved  I spent 31 minutes in preparing to see the patient by review of recent labs, imaging and procedures, obtaining and reviewing separately obtained history, communicating with the patient and family or caregiver, ordering medications, tests or procedures, and documenting clinical information in the EHR including the differential Dx, treatment, and any further evaluation and other management of tonsillitis, copd, htn, hyperglycemia

## 2020-05-09 NOTE — Progress Notes (Signed)
Subjective:    Patient ID: Crystal [name], female    DOB: Feb 11, 1951, 69 y.o.   MRN: [id number]  HPI   Here with 30 days acute onset ST and enlarged right tonsil, fever, facial pain, pressure, headache, general weakness and malaise, and greenish d/c, with mild ST and cough, but pt denies chest pain, wheezing, increased sob or doe, orthopnea, PND, increased LE swelling, palpitations, dizziness or syncope.  Also due soon for left hip THR per ortho in oct 2021.  Asks for referral new dermatology as she wasn't comfortable with the thoroughness of her last exam.  Also due for colonoscopy aug 19.  Pt denies new neurological symptoms such as new headache, or facial or extremity weakness or numbness   Pt denies polydipsia, polyuria Past Medical History:  Diagnosis Date  . ALLERGIC RHINITIS   . Allergy   . ANXIETY    no medations needed  . Arthritis   . Asthma   . Bronchitis   . Cataract    no surgery  . COLONIC POLYPS, HX OF   . COPD    pt unsure of this dx  . Cough   . DEPRESSION   . Fatty liver   . GERD   . GLUCOSE INTOLERANCE   . HYPERTENSION   . IBS   . Impaired glucose tolerance   . Obstructive sleep apnea 06/25/2018  . OSTEOPENIA   . Osteopenia   . Osteoporosis 07/30/2017  . PERIPHERAL EDEMA   . Pneumonia, organism unspecified(486)   . Sleep apnea    Oral Aparatus  . TOBACCO USE DISORDER/SMOKER-SMOKING CESSATION DISCUSSED    Past Surgical History:  Procedure Laterality Date  . birthmark removed     from back as a child  . bladder mesh     2012  . CESAREAN SECTION     x2  . CHOLECYSTECTOMY    . COLONOSCOPY    . WISDOM TOOTH EXTRACTION      reports that she has been smoking cigarettes. She has a 35.00 pack-year smoking history. She has never used smokeless tobacco. She reports current alcohol use of about 21.0 standard drinks of alcohol per week. She reports that she does not use drugs. family history includes Allergic rhinitis in her sister; Anxiety disorder in an  other family member; Bladder Cancer in her father; Breast cancer in her sister; Breast cancer (age of onset: 69) in her sister; Cervical cancer in her sister; Colon cancer in her father and paternal grandfather; Diabetes in an other family member; Heart attack (age of onset: 67) in her maternal grandmother; Heart attack (age of onset: 57) in her maternal uncle; Hyperlipidemia in an other family member; Hypothyroidism in an other family member; Melanoma in her paternal grandfather; Other in her mother; Skin cancer in her brother, father, and mother; Thyroid cancer (age of onset: 34) in her paternal grandmother. Allergies  Allergen Reactions  . Ace Inhibitors Cough  . Penicillins Rash   Current Outpatient Medications on File Prior to Visit  Medication Sig Dispense Refill  . albuterol (VENTOLIN HFA) 108 (90 Base) MCG/ACT inhaler Inhale 1-2 puffs into the lungs every 4 (four) hours as needed. 18 g 0  . Calcium Carb-Cholecalciferol (CALCIUM 600+D) 600-800 MG-UNIT TABS     . chlorhexidine (PERIDEX) 0.12 % solution SMARTSIG:By Mouth    . Cholecalciferol (VITAMIN D PO) Take by mouth daily.    Marland Kitchen denosumab (PROLIA) 60 MG/ML SOSY injection Inject 60 mg into the skin every 6 (six) months.    Marland Kitchen  Fexofenadine HCl (MUCINEX ALLERGY PO) Take 600 mg by mouth as needed.    . fluticasone (FLONASE) 50 MCG/ACT nasal spray Place 2 sprays into both nostrils daily as needed for allergies or rhinitis.    . folic acid (FOLVITE) 767 MCG tablet Take 400 mcg by mouth daily.    Marland Kitchen ibuprofen (ADVIL) 800 MG tablet Take by mouth.    . losartan (COZAAR) 50 MG tablet Take 1 tablet (50 mg total) by mouth daily. 90 tablet 3  . metoprolol succinate (TOPROL-XL) 25 MG 24 hr tablet Take 1 tablet (25 mg total) by mouth daily. 90 tablet 3  . vitamin B-12 (CYANOCOBALAMIN) 1000 MCG tablet Take 1 tablet (1,000 mcg total) by mouth daily. 90 tablet 3   No current facility-administered medications on file prior to visit.   Review of  Systems All otherwise neg per pt     Objective:   Physical Exam BP 140/80 (BP Location: Left Arm, Patient Position: Sitting, Cuff Size: Large)   Pulse 95   Temp 98.8 F (37.1 C) (Oral)   Ht 5' 3"  (1.6 m)   Wt 196 lb (88.9 kg)   SpO2 96%   BMI 34.72 kg/m  VS noted,  Constitutional: Pt appears in NAD HENT: Head: NCAT.  Right Ear: External ear normal.  Left Ear: External ear normal.  Eyes: . Pupils are equal, round, and reactive to light. Conjunctivae and EOM are normal Nose: without d/c or deformity Pharynx with right tonsil enlargement erythema without exudate Neck: Neck supple. Gross normal ROM Cardiovascular: Normal rate and regular rhythm.   Pulmonary/Chest: Effort normal and breath sounds without rales or wheezing.  Abd:  Soft, NT, ND, + BS, no organomegaly Neurological: Pt is alert. At baseline orientation, motor grossly intact Skin: Skin is warm. No rashes, other new lesions, no LE edema Psychiatric: Pt behavior is normal without agitation  All otherwise neg per pt Lab Results  Component Value Date   WBC 6.7 02/22/2020   HGB 15.6 (H) 02/22/2020   HCT 45.5 02/22/2020   PLT 286.0 02/22/2020   GLUCOSE 94 02/22/2020   CHOL 191 02/22/2020   TRIG 50.0 02/22/2020   HDL 85.30 02/22/2020   LDLDIRECT 82.2 07/01/2013   LDLCALC 96 02/22/2020   ALT 15 02/22/2020   AST 18 02/22/2020   NA 139 02/22/2020   K 4.6 02/22/2020   CL 104 02/22/2020   CREATININE 0.67 02/22/2020   BUN 15 02/22/2020   CO2 27 02/22/2020   TSH 1.09 02/22/2020   INR 0.94 04/24/2011   HGBA1C 5.1 02/22/2020      Assessment & Plan:

## 2020-05-18 ENCOUNTER — Telehealth: Payer: Self-pay | Admitting: Family Medicine

## 2020-05-18 ENCOUNTER — Telehealth: Payer: Self-pay

## 2020-05-18 DIAGNOSIS — J029 Acute pharyngitis, unspecified: Secondary | ICD-10-CM

## 2020-05-18 DIAGNOSIS — E2839 Other primary ovarian failure: Secondary | ICD-10-CM

## 2020-05-18 NOTE — Telephone Encounter (Signed)
Sent patient MyChart message.

## 2020-05-18 NOTE — Telephone Encounter (Signed)
New message    Per patient voiced need referral sent over to    1.ENT      ----Reason antibiotic for 10 days didn't help      ----Open to suggestion  2. [facility] on [place]      ----bone density  been for 3 years on Prolia upcoming hip surgery

## 2020-05-18 NOTE — Telephone Encounter (Signed)
Patient called stating that she was here in June for knee injections. The left knee has been going great but her right knee quickly got worse. She said that it is pretty painful and locks up on her. She said that yesterday it locked up pretty bad and took a while for it to calm down.  She wanted Dr Tamala Julian to be aware.  Please advise.

## 2020-05-23 ENCOUNTER — Ambulatory Visit (AMBULATORY_SURGERY_CENTER): Payer: Self-pay

## 2020-05-23 ENCOUNTER — Encounter: Payer: Self-pay | Admitting: Internal Medicine

## 2020-05-23 ENCOUNTER — Other Ambulatory Visit: Payer: Self-pay

## 2020-05-23 VITALS — Ht 63.0 in | Wt 195.0 lb

## 2020-05-23 DIAGNOSIS — Z800 Family history of malignant neoplasm of digestive organs: Secondary | ICD-10-CM

## 2020-05-23 DIAGNOSIS — Z86010 Personal history of colonic polyps: Secondary | ICD-10-CM

## 2020-05-23 MED ORDER — SUTAB 1479-225-188 MG PO TABS: 1.0000 | ORAL_TABLET | ORAL | 0 refills | Status: DC

## 2020-05-23 NOTE — Progress Notes (Signed)
Patient reports she has had recent (04/25/2020) oral surgery-"tissue graft from roof of mouth to front of gums";   No egg or soy allergy known to patient  No issues with past sedation with any surgeries or procedures No intubation problems in the past  No diet pills per patient No home 02 use per patient  No blood thinners per patient  Pt denies issues with constipation  No A fib or A flutter  EMMI video to [place] 19 guidelines implemented in PV today   Coupon given to pt in [facility] today  COVID vaccines completed on 12/2019 per pt;  Due to the COVID-19 pandemic we are asking patients to follow these guidelines. Please only bring one care partner. Please be aware that your care partner may wait in the car in the parking lot or if they feel like they will be too hot to wait in the car, they may wait in the lobby on the 4th floor. All care partners are required to wear a mask the entire time (we do not have any that we can provide them), they need to practice social distancing, and we will do a Covid check for all patient's and care partners when you arrive. Also we will check their temperature and your temperature. If the care partner waits in their car they need to stay in the parking lot the entire time and we will call them on their cell phone when the patient is ready for discharge so they can bring the car to the front of the building. Also all patient's will need to wear a mask into building.

## 2020-05-23 NOTE — Telephone Encounter (Signed)
Ok these are done

## 2020-05-23 NOTE — Telephone Encounter (Signed)
Sent to Dr. John. 

## 2020-06-06 ENCOUNTER — Encounter: Payer: Self-pay | Admitting: Certified Registered Nurse Anesthetist

## 2020-06-07 ENCOUNTER — Encounter: Payer: Self-pay | Admitting: Gastroenterology

## 2020-06-07 ENCOUNTER — Ambulatory Visit (AMBULATORY_SURGERY_CENTER): Payer: Medicare HMO | Admitting: Gastroenterology

## 2020-06-07 ENCOUNTER — Other Ambulatory Visit: Payer: Self-pay

## 2020-06-07 VITALS — BP 114/65 | HR 72 | Temp 98.0°F | Resp 16 | Ht 63.0 in | Wt 195.0 lb

## 2020-06-07 DIAGNOSIS — D126 Benign neoplasm of colon, unspecified: Secondary | ICD-10-CM | POA: Diagnosis not present

## 2020-06-07 DIAGNOSIS — D122 Benign neoplasm of ascending colon: Secondary | ICD-10-CM | POA: Diagnosis not present

## 2020-06-07 DIAGNOSIS — Z86010 Personal history of colonic polyps: Secondary | ICD-10-CM | POA: Diagnosis not present

## 2020-06-07 DIAGNOSIS — J449 Chronic obstructive pulmonary disease, unspecified: Secondary | ICD-10-CM | POA: Diagnosis not present

## 2020-06-07 DIAGNOSIS — Z800 Family history of malignant neoplasm of digestive organs: Secondary | ICD-10-CM | POA: Diagnosis not present

## 2020-06-07 DIAGNOSIS — I10 Essential (primary) hypertension: Secondary | ICD-10-CM | POA: Diagnosis not present

## 2020-06-07 DIAGNOSIS — D129 Benign neoplasm of anus and anal canal: Secondary | ICD-10-CM

## 2020-06-07 DIAGNOSIS — D128 Benign neoplasm of rectum: Secondary | ICD-10-CM | POA: Diagnosis not present

## 2020-06-07 DIAGNOSIS — D123 Benign neoplasm of transverse colon: Secondary | ICD-10-CM

## 2020-06-07 DIAGNOSIS — G4733 Obstructive sleep apnea (adult) (pediatric): Secondary | ICD-10-CM | POA: Diagnosis not present

## 2020-06-07 MED ORDER — SODIUM CHLORIDE 0.9 % IV SOLN
500.0000 mL | Freq: Once | INTRAVENOUS | Status: DC
Start: 2020-06-07 — End: 2020-06-07

## 2020-06-07 NOTE — Op Note (Signed)
[place] Patient Name: Crystal [name] Procedure Date: 06/07/2020 8:55 AM MRN: [id number] Endoscopist: Remo Lipps P. Havery Moros , MD Age: 69 Referring MD:  Date of Birth: Dec 07, 1950 Gender: Female Account #: [id number] Procedure:                Colonoscopy Indications:              High risk colon cancer surveillance: Personal                            history of colonic polyps - 10 adenomas in 2017, 5                            adenomas in 2018, negative genetic testing, father                            with history of colon cancer Medicines:                Monitored Anesthesia Care Procedure:                Pre-Anesthesia Assessment:                           - Prior to the procedure, a History and Physical                            was performed, and patient medications and                            allergies were reviewed. The patient's tolerance of                            previous anesthesia was also reviewed. The risks                            and benefits of the procedure and the sedation                            options and risks were discussed with the patient.                            All questions were answered, and informed consent                            was obtained. Prior Anticoagulants: The patient has                            taken no previous anticoagulant or antiplatelet                            agents. ASA Grade Assessment: III - A patient with                            severe systemic disease. After reviewing the risks  and benefits, the patient was deemed in                            satisfactory condition to undergo the procedure.                           After obtaining informed consent, the colonoscope                            was passed under direct vision. Throughout the                            procedure, the patient's blood pressure, pulse, and                            oxygen saturations were  monitored continuously. The                            Colonoscope was introduced through the anus and                            advanced to the the cecum, identified by                            appendiceal orifice and ileocecal valve. The                            colonoscopy was performed without difficulty. The                            patient tolerated the procedure well. The quality                            of the bowel preparation was good. The ileocecal                            valve, appendiceal orifice, and rectum were                            photographed. Scope In: 9:15:43 AM Scope Out: 9:43:23 AM Scope Withdrawal Time: 0 hours 16 minutes 14 seconds  Total Procedure Duration: 0 hours 27 minutes 40 seconds  Findings:                 The perianal and digital rectal examinations were                            normal.                           A 3 mm polyp was found in the ascending colon. The                            polyp was sessile. The polyp was removed with a  cold snare. Resection and retrieval were complete.                           A 3 mm polyp was found in the hepatic flexure. The                            polyp was sessile. The polyp was removed with a                            cold snare. Resection and retrieval were complete.                           Three flat and sessile polyps were found in the                            transverse colon. The polyps were 3 to 5 mm in                            size. These polyps were removed with a cold snare.                            Resection and retrieval were complete.                           A 4 mm polyp was found in the rectum. The polyp was                            sessile. The polyp was removed with a cold snare.                            Resection and retrieval were complete.                           A single medium-sized angiodysplastic lesion was                             found in the cecum.                           Multiple medium-mouthed diverticula were found in                            the left colon.                           Internal hemorrhoids were found during retroflexion.                           The colon was tortuous.                           The exam was otherwise without abnormality. Complications:            No immediate complications. Estimated blood loss:  Minimal. Estimated Blood Loss:     Estimated blood loss was minimal. Impression:               - One 3 mm polyp in the ascending colon, removed                            with a cold snare. Resected and retrieved.                           - One 3 mm polyp at the hepatic flexure, removed                            with a cold snare. Resected and retrieved.                           - Three 3 to 5 mm polyps in the transverse colon,                            removed with a cold snare. Resected and retrieved.                           - One 4 mm polyp in the rectum, removed with a cold                            snare. Resected and retrieved.                           - A single colonic angiodysplastic lesion.                           - Diverticulosis in the left colon.                           - Internal hemorrhoids.                           - Tortuous colon.                           - The examination was otherwise normal. Recommendation:           - Patient has a contact number available for                            emergencies. The signs and symptoms of potential                            delayed complications were discussed with the                            patient. Return to normal activities tomorrow.                            Written discharge instructions were provided to the  patient.                           - Resume previous diet.                           - Continue present medications.                            - Await pathology results. Remo Lipps P. Khrystina Bonnes, MD 06/07/2020 9:50:20 AM This report has been signed electronically.

## 2020-06-07 NOTE — Progress Notes (Signed)
Report given to PACU, vss 

## 2020-06-07 NOTE — Patient Instructions (Signed)
Impression/Recommendations:  Polyp handout given to patient. Diverticulosis handout given to patient. Hemorrhoid handout given to patient.  Resume previous diet. Continue present medications. Await pathology results.  YOU HAD AN ENDOSCOPIC PROCEDURE TODAY AT [facility]:   Refer to the procedure report that was given to you for any specific questions about what was found during the examination.  If the procedure report does not answer your questions, please call your gastroenterologist to clarify.  If you requested that your care partner not be given the details of your procedure findings, then the procedure report has been included in a sealed envelope for you to review at your convenience later.  YOU SHOULD EXPECT: Some feelings of bloating in the abdomen. Passage of more gas than usual.  Walking can help get rid of the air that was put into your GI tract during the procedure and reduce the bloating. If you had a lower endoscopy (such as a colonoscopy or flexible sigmoidoscopy) you may notice spotting of blood in your stool or on the toilet paper. If you underwent a bowel prep for your procedure, you may not have a normal bowel movement for a few days.  Please Note:  You might notice some irritation and congestion in your nose or some drainage.  This is from the oxygen used during your procedure.  There is no need for concern and it should clear up in a day or so.  SYMPTOMS TO REPORT IMMEDIATELY:   Following lower endoscopy (colonoscopy or flexible sigmoidoscopy):  Excessive amounts of blood in the stool  Significant tenderness or worsening of abdominal pains  Swelling of the abdomen that is new, acute  Fever of 100F or higher  For urgent or emergent issues, a gastroenterologist can be reached at any hour by calling [phone]. Do not use MyChart messaging for urgent concerns.    DIET:  We do recommend a small meal at first, but then you may proceed to your  regular diet.  Drink plenty of fluids but you should avoid alcoholic beverages for 24 hours.  ACTIVITY:  You should plan to take it easy for the rest of today and you should NOT DRIVE or use heavy machinery until tomorrow (because of the sedation medicines used during the test).    FOLLOW UP: Our staff will call the number listed on your records 48-72 hours following your procedure to check on you and address any questions or concerns that you may have regarding the information given to you following your procedure. If we do not reach you, we will leave a message.  We will attempt to reach you two times.  During this call, we will ask if you have developed any symptoms of COVID 19. If you develop any symptoms (ie: fever, flu-like symptoms, shortness of breath, cough etc.) before then, please call [phone].  If you test positive for Covid 19 in the 2 weeks post procedure, please call and report this information to [place].    If any biopsies were taken you will be contacted by phone or by letter within the next 1-3 weeks.  Please call us at [phone] if you have not heard about the biopsies in 3 weeks.    SIGNATURES/CONFIDENTIALITY: You and/or your care partner have signed paperwork which will be entered into your electronic medical record.  These signatures attest to the fact that that the information above on your After Visit Summary has been reviewed and is understood.  Full responsibility of the confidentiality of  this discharge information lies with you and/or your care-partner.

## 2020-06-07 NOTE — Progress Notes (Signed)
Pt's states no medical or surgical changes since previsit or office visit. 

## 2020-06-07 NOTE — Progress Notes (Signed)
Called to room to assist during endoscopic procedure.  Patient ID and intended procedure confirmed with present staff. Received instructions for my participation in the procedure from the performing physician.  

## 2020-06-07 NOTE — Progress Notes (Signed)
VS taken by C.W. 

## 2020-06-11 ENCOUNTER — Telehealth: Payer: Self-pay

## 2020-06-11 NOTE — Telephone Encounter (Signed)
Covid-19 screening questions   Do you now or have you had a fever in the last 14 days? No.  Do you have any respiratory symptoms of shortness of breath or cough now or in the last 14 days? No.  Do you have any family members or close contacts with diagnosed or suspected Covid-19 in the past 14 days? No. Have you been tested for Covid-19 and found to be positive? No.      Follow up Call-  Call back number 06/07/2020  Post procedure Call Back phone  # [phone]  Permission to leave phone message Yes  Some recent data might be hidden     Patient questions:  Do you have a fever, pain , or abdominal swelling? No. Pain Score  0 *  Have you tolerated food without any problems? Yes.    Have you been able to return to your normal activities? Yes.    Do you have any questions about your discharge instructions: Diet   No. Medications  No. Follow up visit  No.  Do you have questions or concerns about your Care? No.  Actions: * If pain score is 4 or above: No action needed, pain <4.

## 2020-06-13 ENCOUNTER — Ambulatory Visit (INDEPENDENT_AMBULATORY_CARE_PROVIDER_SITE_OTHER): Payer: Medicare HMO | Admitting: Otolaryngology

## 2020-06-13 ENCOUNTER — Other Ambulatory Visit: Payer: Self-pay

## 2020-06-13 ENCOUNTER — Encounter (INDEPENDENT_AMBULATORY_CARE_PROVIDER_SITE_OTHER): Payer: Self-pay | Admitting: Otolaryngology

## 2020-06-13 VITALS — Temp 97.2°F

## 2020-06-13 DIAGNOSIS — J351 Hypertrophy of tonsils: Secondary | ICD-10-CM | POA: Diagnosis not present

## 2020-06-13 DIAGNOSIS — M85851 Other specified disorders of bone density and structure, right thigh: Secondary | ICD-10-CM | POA: Diagnosis not present

## 2020-06-13 DIAGNOSIS — M85852 Other specified disorders of bone density and structure, left thigh: Secondary | ICD-10-CM | POA: Diagnosis not present

## 2020-06-13 DIAGNOSIS — M810 Age-related osteoporosis without current pathological fracture: Secondary | ICD-10-CM | POA: Diagnosis not present

## 2020-06-13 LAB — HM DEXA SCAN

## 2020-06-13 NOTE — Progress Notes (Signed)
HPI: Crystal [name] is a 69 y.o. female who presents is referred by Dr. Jenny Reichmann for evaluation of a swollen gland on the right side of her throat.  She first noticed this following steroid shots by her orthopedist.  She had a little bit of a sore throat.  Apparently she put her finger back in her throat and felt a small lump on the right side.  She continues to feel the lump which has been persistent.  On oral examination in the mirror she does not see anything abnormal but feels it with her finger..  Denies sore throat presently and no trouble swallowing.  Past Medical History:  Diagnosis Date  . ALLERGIC RHINITIS   . Allergy   . ANXIETY    no medations needed  . Arthritis   . Asthma   . Bronchitis   . Cataract    no surgery  . COLONIC POLYPS, HX OF   . COPD    pt unsure of this dx  . Cough   . DEPRESSION   . Fatty liver   . GERD   . GLUCOSE INTOLERANCE   . HYPERTENSION   . IBS   . Impaired glucose tolerance   . Obstructive sleep apnea 06/25/2018  . OSTEOPENIA   . Osteopenia   . Osteoporosis 07/30/2017  . PERIPHERAL EDEMA   . Pneumonia, organism unspecified(486)   . Sleep apnea    Oral Aparatus  . TOBACCO USE DISORDER/SMOKER-SMOKING CESSATION DISCUSSED    Past Surgical History:  Procedure Laterality Date  . birthmark removed     from back as a child  . bladder mesh     2012  . CESAREAN SECTION     x2  . CHOLECYSTECTOMY    . COLONOSCOPY  2018   TA  . MOUTH SURGERY  04/25/2020   tissue graft -room of mouth to front gums  . WISDOM TOOTH EXTRACTION     Social History   Socioeconomic History  . Marital status: Widowed    Spouse name: Not on file  . Number of children: 2  . Years of education: Not on file  . Highest education level: Not on file  Occupational History  . Occupation: Occupational psychologist industries, Museum/gallery curator for [employer]  Tobacco Use  . Smoking status: Current Every Day Smoker    Packs/day: 1.00    Years: 42.00    Pack years: 42.00     Types: Cigarettes    Start date: 47  . Smokeless tobacco: Never Used  Vaping Use  . Vaping Use: Never used  Substance and Sexual Activity  . Alcohol use: Yes    Alcohol/week: 21.0 standard drinks    Types: 7 Glasses of wine, 14 Standard drinks or equivalent per week    Comment: daily beers/wine  . Drug use: No  . Sexual activity: Not on file  Other Topics Concern  . Not on file  Social History Narrative  . Not on file   Social Determinants of Health   Financial Resource Strain:   . Difficulty of Paying Living Expenses: Not on file  Food Insecurity:   . Worried About Charity fundraiser in the Last Year: Not on file  . Ran Out of Food in the Last Year: Not on file  Transportation Needs:   . Lack of Transportation (Medical): Not on file  . Lack of Transportation (Non-Medical): Not on file  Physical Activity:   . Days of Exercise per Week: Not on file  .  Minutes of Exercise per Session: Not on file  Stress:   . Feeling of Stress : Not on file  Social Connections:   . Frequency of Communication with Friends and Family: Not on file  . Frequency of Social Gatherings with Friends and Family: Not on file  . Attends Religious Services: Not on file  . Active Member of Clubs or Organizations: Not on file  . Attends Archivist Meetings: Not on file  . Marital Status: Not on file   Family History  Problem Relation Age of Onset  . Breast cancer Sister 40       +lump and tamoxifen; eventually had bilateral mastectomies  . Allergic rhinitis Sister   . Colon cancer Father        dx. >36  . Bladder Cancer Father        dx. 11-79; not a smoker  . Skin cancer Father        basal cell carcinoma  . Colon polyps Father   . Colon cancer Paternal Grandfather        unspecified age  . Melanoma Paternal Grandfather        unspecified age  . Skin cancer Mother        NOS; dx. 65s  . Other Mother        hx of hysterectomy in her early 34s for fibroid cysts  . Heart attack  Maternal Uncle 67  . Heart attack Maternal Grandmother 62  . Thyroid cancer Paternal Grandmother 88  . Breast cancer Sister        dx. early 70s; s/p BL mastectomies  . Cervical cancer Sister        dx. late 51s  . Skin cancer Brother        NOS  . Anxiety disorder Other   . Hypothyroidism Other   . Hyperlipidemia Other   . Diabetes Other   . Esophageal cancer Neg Hx   . Stomach cancer Neg Hx   . Rectal cancer Neg Hx    Allergies  Allergen Reactions  . Ace Inhibitors Cough  . Penicillins Rash   Prior to Admission medications   Medication Sig Start Date End Date Taking? Authorizing Provider  albuterol (VENTOLIN HFA) 108 (90 Base) MCG/ACT inhaler Inhale 1-2 puffs into the lungs every 4 (four) hours as needed. 10/18/19  Yes Bobbitt, Sedalia Muta, MD  Calcium Carb-Cholecalciferol (CALCIUM 600+D) 600-800 MG-UNIT TABS  10/20/09  Yes [provider]  Cholecalciferol (VITAMIN D PO) Take by mouth daily.   Yes [provider]  denosumab (PROLIA) 60 MG/ML SOSY injection Inject 60 mg into the skin every 6 (six) months.   Yes [provider]  Fexofenadine HCl (MUCINEX ALLERGY PO) Take 600 mg by mouth as needed.   Yes [provider]  fluticasone (FLONASE) 50 MCG/ACT nasal spray Place 2 sprays into both nostrils daily as needed for allergies or rhinitis.   Yes [provider]  folic acid (FOLVITE) 300 MCG tablet Take 400 mcg by mouth daily.   Yes [provider]  ibuprofen (ADVIL) 800 MG tablet Take by mouth. 05/04/20  Yes [provider]  losartan (COZAAR) 50 MG tablet Take 1 tablet (50 mg total) by mouth daily. 02/24/20  Yes Biagio Borg, MD  metoprolol succinate (TOPROL-XL) 25 MG 24 hr tablet Take 1 tablet (25 mg total) by mouth daily. 02/24/20  Yes Biagio Borg, MD  vitamin B-12 (CYANOCOBALAMIN) 1000 MCG tablet Take 1 tablet (1,000 mcg total) by  mouth daily. 02/24/20  Yes Biagio Borg, MD     Positive ROS: Otherwise negative  All  other systems have been reviewed and were otherwise negative with the exception of those mentioned in the HPI and as above.  Physical Exam: Constitutional: Alert, well-appearing, no acute distress Ears: External ears without lesions or tenderness. Ear canals are clear bilaterally with intact, clear TMs.  Nasal: External nose without lesions. Clear nasal passages Oral: Lips and gums without lesions. Tongue and palate mucosa without lesions. Posterior oropharynx clear.  Tonsil regions appear benign bilaterally and do not appear enlarged.  On palpation of the small lump she feels with her finger this represents the superior pole of the right tonsil deep to the mucosa of the soft palate on the right side and is consistent with a benign minimal tonsillar enlargement.  Indirect laryngoscopy reveals clear base of tongue. Neck: No palpable adenopathy or masses.  No palpable adenopathy on the right side of the neck. Respiratory: Breathing comfortably  Skin: No facial/neck lesions or rash noted.  Procedures  Assessment: Discussed with her that the lump she feels represents the superior tonsil on the right side and clinically is benign with no evidence of neoplasm.  Plan: I discussed with her that this is nothing to worry about and we just plan observation.  Discussed with her concerning not palpating this anymore but if she sees any enlargement in this area in a mirror she will call for follow-up   Radene Journey, MD   CC:

## 2020-07-02 ENCOUNTER — Encounter: Payer: Self-pay | Admitting: Internal Medicine

## 2020-07-11 ENCOUNTER — Ambulatory Visit: Payer: Medicare HMO

## 2020-07-20 DIAGNOSIS — D1801 Hemangioma of skin and subcutaneous tissue: Secondary | ICD-10-CM | POA: Diagnosis not present

## 2020-07-20 DIAGNOSIS — L814 Other melanin hyperpigmentation: Secondary | ICD-10-CM | POA: Diagnosis not present

## 2020-07-20 DIAGNOSIS — D485 Neoplasm of uncertain behavior of skin: Secondary | ICD-10-CM | POA: Diagnosis not present

## 2020-07-20 DIAGNOSIS — L821 Other seborrheic keratosis: Secondary | ICD-10-CM | POA: Diagnosis not present

## 2020-07-20 DIAGNOSIS — L578 Other skin changes due to chronic exposure to nonionizing radiation: Secondary | ICD-10-CM | POA: Diagnosis not present

## 2020-07-20 DIAGNOSIS — L989 Disorder of the skin and subcutaneous tissue, unspecified: Secondary | ICD-10-CM | POA: Diagnosis not present

## 2020-07-24 ENCOUNTER — Ambulatory Visit (INDEPENDENT_AMBULATORY_CARE_PROVIDER_SITE_OTHER): Payer: Medicare HMO

## 2020-07-24 ENCOUNTER — Other Ambulatory Visit: Payer: Self-pay

## 2020-07-24 DIAGNOSIS — M810 Age-related osteoporosis without current pathological fracture: Secondary | ICD-10-CM | POA: Diagnosis not present

## 2020-07-24 MED ORDER — DENOSUMAB 60 MG/ML ~~LOC~~ SOSY
60.0000 mg | PREFILLED_SYRINGE | Freq: Once | SUBCUTANEOUS | Status: AC
  Administered 2020-07-24: 60 mg via SUBCUTANEOUS

## 2020-07-24 NOTE — Progress Notes (Signed)
Prolia given as ordered by Dr Jenny Reichmann in left arm.  Pt tolerated well.

## 2020-07-25 ENCOUNTER — Ambulatory Visit: Payer: Medicare HMO

## 2020-08-03 ENCOUNTER — Telehealth: Payer: Self-pay | Admitting: Internal Medicine

## 2020-08-03 ENCOUNTER — Other Ambulatory Visit: Payer: Self-pay | Admitting: Orthopaedic Surgery

## 2020-08-03 DIAGNOSIS — N644 Mastodynia: Secondary | ICD-10-CM

## 2020-08-03 NOTE — Telephone Encounter (Signed)
    Patient requesting order for mammogram and ultrasound of breast.  She is having some pain in breast Requesting order be sent to [facility]

## 2020-08-06 NOTE — Telephone Encounter (Signed)
Order done

## 2020-08-06 NOTE — Telephone Encounter (Signed)
Sent to Dr. John. 

## 2020-08-14 ENCOUNTER — Encounter: Payer: Self-pay | Admitting: Internal Medicine

## 2020-08-15 NOTE — Telephone Encounter (Signed)
Dr. Jenny Reichmann, [place] did put [facility] as the location on the orders, but we were made aware of her wanting to go to [facility] on 10/21 and the order was sent to them on that day.  I just called [facility] to be sure they do have the order and they did get it back then and the patient is scheduled with them on 11/3 @ 745.  I'm not quite sure what happened with pt but I did try to reach out to the her but had to leave a detailed msg on her vm. I will also reply to her mychart message.  Thanks Cecille Rubin

## 2020-08-16 ENCOUNTER — Telehealth: Payer: Self-pay

## 2020-08-16 NOTE — Telephone Encounter (Signed)
Spoke to [employer] surgery coordinator at [facility] I told her we need the pt. To sign authorization for release of medical records before Dr. Verlin Fester will review and send office notes back for medical clearance.

## 2020-08-22 DIAGNOSIS — N644 Mastodynia: Secondary | ICD-10-CM | POA: Diagnosis not present

## 2020-08-22 DIAGNOSIS — N6311 Unspecified lump in the right breast, upper outer quadrant: Secondary | ICD-10-CM | POA: Diagnosis not present

## 2020-08-22 DIAGNOSIS — N6001 Solitary cyst of right breast: Secondary | ICD-10-CM | POA: Diagnosis not present

## 2020-08-28 DIAGNOSIS — Z124 Encounter for screening for malignant neoplasm of cervix: Secondary | ICD-10-CM | POA: Diagnosis not present

## 2020-08-28 NOTE — Care Plan (Signed)
Ortho Bundle Case Management Note  Patient Details  Name: Crystal [name] MRN: [id number] Date of Birth: [birth year]  Patient will discharge to home with family to assist. Rolling walker ordered. HHPT referral to [facility] and OPPT set up at [facility].  Patient and MD in agreement with plan. Choice offered.                    DME Arranged:  [place] Rile rolling DME Agency:  [facility]  HH Arranged:  PT [place] Agency:  [facility] ([facility]  Additional Comments: Please contact me with any questions of if this plan should need to change.  Ladell Heads,  [place] [facility]  [phone] 08/28/2020, 11:21 AM

## 2020-08-29 NOTE — Progress Notes (Signed)
DUE TO COVID-19 ONLY ONE VISITOR IS ALLOWED TO COME WITH YOU AND STAY IN THE WAITING ROOM ONLY DURING PRE OP AND PROCEDURE DAY OF SURGERY. THE 1 VISITOR  MAY VISIT WITH YOU AFTER SURGERY IN YOUR PRIVATE ROOM DURING VISITING HOURS ONLY!  YOU NEED TO HAVE A COVID 19 TEST ON_______ @_______ , THIS TEST MUST BE DONE BEFORE SURGERY,  COVID TESTING SITE [address] [place] [address], IT IS ON THE RIGHT GOING OUT [address] APPROXIMATELY  2 MINUTES PAST [employer] ON THE RIGHT. ONCE YOUR COVID TEST IS COMPLETED,  PLEASE BEGIN THE QUARANTINE INSTRUCTIONS AS OUTLINED IN YOUR HANDOUT.                Crystal [name]  08/29/2020   Your procedure is scheduled on:    Report to [facility]  Entrance   Report to admitting at AM     Call this number if you have problems the morning of surgery [phone]    REMEMBER: NO  SOLID FOOD CANDY OR GUM AFTER MIDNIGHT. CLEAR LIQUIDS UNTIL         . NOTHING BY MOUTH EXCEPT CLEAR LIQUIDS UNTIL    . PLEASE FINISH ENSURE DRINK PER SURGEON ORDER  WHICH NEEDS TO BE COMPLETED AT      .      CLEAR LIQUID DIET   Foods Allowed                                                                    Coffee and tea, regular and decaf                            Fruit ices (not with fruit pulp)                                      Iced Popsicles                                    Carbonated beverages, regular and diet                                    Cranberry, grape and apple juices Sports drinks like Gatorade Lightly seasoned clear broth or consume(fat free) Sugar, honey syrup ___________________________________________________________________      BRUSH YOUR TEETH MORNING OF SURGERY AND RINSE YOUR MOUTH OUT, NO CHEWING GUM CANDY OR MINTS.     Take these medicines the morning of surgery with A SIP OF WATER:   DO NOT TAKE ANY DIABETIC MEDICATIONS DAY OF YOUR SURGERY                               You may not have any  metal on your body including hair pins and              piercings  Do not wear jewelry, make-up, lotions, powders or perfumes, deodorant  Do not wear nail polish on your fingernails.  Do not shave  48 hours prior to surgery.              Men may shave face and neck.   Do not bring valuables to the hospital. [place].  Contacts, dentures or bridgework may not be worn into surgery.  Leave suitcase in the car. After surgery it may be brought to your room.     Patients discharged the day of surgery will not be allowed to drive home. IF YOU ARE HAVING SURGERY AND GOING HOME THE SAME DAY, YOU MUST HAVE AN ADULT TO DRIVE YOU HOME AND BE WITH YOU FOR 24 HOURS. YOU MAY GO HOME BY TAXI OR UBER OR ORTHERWISE, BUT AN ADULT MUST ACCOMPANY YOU HOME AND STAY WITH YOU FOR 24 HOURS.  Name and phone number of your driver:  Special Instructions: N/A              Please read over the following fact sheets you were given: _____________________________________________________________________  [facility] - Preparing for Surgery Before surgery, you can play an important role.  Because skin is not sterile, your skin needs to be as free of germs as possible.  You can reduce the number of germs on your skin by washing with CHG (chlorahexidine gluconate) soap before surgery.  CHG is an antiseptic cleaner which kills germs and bonds with the skin to continue killing germs even after washing. Please DO NOT use if you have an allergy to CHG or antibacterial soaps.  If your skin becomes reddened/irritated stop using the CHG and inform your nurse when you arrive at Short Stay. Do not shave (including legs and underarms) for at least 48 hours prior to the first CHG shower.  You may shave your face/neck. Please follow these instructions carefully:  1.  Shower with CHG Soap the night before surgery and the  morning of Surgery.  2.  If you choose to wash your hair,  wash your hair first as usual with your  normal  shampoo.  3.  After you shampoo, rinse your hair and body thoroughly to remove the  shampoo.                           4.  Use CHG as you would any other liquid soap.  You can apply chg directly  to the skin and wash                       Gently with a scrungie or clean washcloth.  5.  Apply the CHG Soap to your body ONLY FROM THE NECK DOWN.   Do not use on face/ open                           Wound or open sores. Avoid contact with eyes, ears mouth and genitals (private parts).                       Wash face,  Genitals (private parts) with your normal soap.             6.  Wash thoroughly, paying special attention to the area where your surgery  will be performed.  7.  Thoroughly rinse your body with  warm water from the neck down.  8.  DO NOT shower/wash with your normal soap after using and rinsing off  the CHG Soap.                9.  Pat yourself dry with a clean towel.            10.  Wear clean pajamas.            11.  Place clean sheets on your bed the night of your first shower and do not  sleep with pets. Day of Surgery : Do not apply any lotions/deodorants the morning of surgery.  Please wear clean clothes to the hospital/surgery center.  FAILURE TO FOLLOW THESE INSTRUCTIONS MAY RESULT IN THE CANCELLATION OF YOUR SURGERY PATIENT SIGNATURE_________________________________  NURSE SIGNATURE__________________________________  ________________________________________________________________________

## 2020-08-29 NOTE — Progress Notes (Signed)
DUE TO COVID-19 ONLY ONE VISITOR IS ALLOWED TO COME WITH YOU AND STAY IN THE WAITING ROOM ONLY DURING PRE OP AND PROCEDURE DAY OF SURGERY. THE 1 VISITOR  MAY VISIT WITH YOU AFTER SURGERY IN YOUR PRIVATE ROOM DURING VISITING HOURS ONLY!  YOU NEED TO HAVE A COVID 19 TEST ON__11/19/2021 _____ @_______ , THIS TEST MUST BE DONE BEFORE SURGERY,  COVID TESTING SITE [address] [place] [address], IT IS ON THE RIGHT GOING OUT [address] APPROXIMATELY  2 MINUTES PAST [employer] ON THE RIGHT. ONCE YOUR COVID TEST IS COMPLETED,  PLEASE BEGIN THE QUARANTINE INSTRUCTIONS AS OUTLINED IN YOUR HANDOUT.                Crystal [name]  08/29/2020   Your procedure is scheduled on:   09/11/2020    Report to [facility] Main  Entrance   Report to admitting at     0530 AM     Call this number if you have problems the morning of surgery [phone]    REMEMBER: NO  SOLID FOOD CANDY OR GUM AFTER MIDNIGHT. CLEAR LIQUIDS UNTIL 0430AM        . NOTHING BY MOUTH EXCEPT CLEAR LIQUIDS UNTIL    . PLEASE FINISH ENSURE DRINK PER SURGEON ORDER  WHICH NEEDS TO BE COMPLETED AT   0430AM   .      CLEAR LIQUID DIET   Foods Allowed                                                                    Coffee and tea, regular and decaf                            Fruit ices (not with fruit pulp)                                      Iced Popsicles                                    Carbonated beverages, regular and diet                                    Cranberry, grape and apple juices Sports drinks like Gatorade Lightly seasoned clear broth or consume(fat free) Sugar, honey syrup ___________________________________________________________________      BRUSH YOUR TEETH MORNING OF SURGERY AND RINSE YOUR MOUTH OUT, NO CHEWING GUM CANDY OR MINTS.     Take these medicines the morning of surgery with A SIP OF WATER: inhalers as usual and bring, Flonase if needed, Toprol   DO NOT TAKE  ANY DIABETIC MEDICATIONS DAY OF YOUR SURGERY                               You may not have any metal on your body including hair pins and  piercings  Do not wear jewelry, make-up, lotions, powders or perfumes, deodorant             Do not wear nail polish on your fingernails.  Do not shave  48 hours prior to surgery.              Men may shave face and neck.   Do not bring valuables to the hospital. [place].  Contacts, dentures or bridgework may not be worn into surgery.  Leave suitcase in the car. After surgery it may be brought to your room.     Patients discharged the day of surgery will not be allowed to drive home. IF YOU ARE HAVING SURGERY AND GOING HOME THE SAME DAY, YOU MUST HAVE AN ADULT TO DRIVE YOU HOME AND BE WITH YOU FOR 24 HOURS. YOU MAY GO HOME BY TAXI OR UBER OR ORTHERWISE, BUT AN ADULT MUST ACCOMPANY YOU HOME AND STAY WITH YOU FOR 24 HOURS.  Name and phone number of your driver:  Special Instructions: N/A              Please read over the following fact sheets you were given: _____________________________________________________________________  [facility] - Preparing for Surgery Before surgery, you can play an important role.  Because skin is not sterile, your skin needs to be as free of germs as possible.  You can reduce the number of germs on your skin by washing with CHG (chlorahexidine gluconate) soap before surgery.  CHG is an antiseptic cleaner which kills germs and bonds with the skin to continue killing germs even after washing. Please DO NOT use if you have an allergy to CHG or antibacterial soaps.  If your skin becomes reddened/irritated stop using the CHG and inform your nurse when you arrive at Short Stay. Do not shave (including legs and underarms) for at least 48 hours prior to the first CHG shower.  You may shave your face/neck. Please follow these instructions carefully:  1.  Shower with CHG  Soap the night before surgery and the  morning of Surgery.  2.  If you choose to wash your hair, wash your hair first as usual with your  normal  shampoo.  3.  After you shampoo, rinse your hair and body thoroughly to remove the  shampoo.                           4.  Use CHG as you would any other liquid soap.  You can apply chg directly  to the skin and wash                       Gently with a scrungie or clean washcloth.  5.  Apply the CHG Soap to your body ONLY FROM THE NECK DOWN.   Do not use on face/ open                           Wound or open sores. Avoid contact with eyes, ears mouth and genitals (private parts).                       Wash face,  Genitals (private parts) with your normal soap.             6.  Wash thoroughly, paying special attention to the area where your surgery  will be performed.  7.  Thoroughly rinse your body with warm water from the neck down.  8.  DO NOT shower/wash with your normal soap after using and rinsing off  the CHG Soap.                9.  Pat yourself dry with a clean towel.            10.  Wear clean pajamas.            11.  Place clean sheets on your bed the night of your first shower and do not  sleep with pets. Day of Surgery : Do not apply any lotions/deodorants the morning of surgery.  Please wear clean clothes to the hospital/surgery center.  FAILURE TO FOLLOW THESE INSTRUCTIONS MAY RESULT IN THE CANCELLATION OF YOUR SURGERY PATIENT SIGNATURE_________________________________  NURSE SIGNATURE__________________________________  ________________________________________________________________________

## 2020-08-31 ENCOUNTER — Encounter (HOSPITAL_COMMUNITY): Payer: Self-pay

## 2020-08-31 ENCOUNTER — Ambulatory Visit (HOSPITAL_COMMUNITY)
Admission: RE | Admit: 2020-08-31 | Discharge: 2020-08-31 | Disposition: A | Discharge status: Home / Self Care | Payer: Medicare HMO | Source: Ambulatory Visit | Attending: Orthopaedic Surgery | Admitting: Orthopaedic Surgery

## 2020-08-31 ENCOUNTER — Encounter (HOSPITAL_COMMUNITY)
Admission: RE | Admit: 2020-08-31 | Discharge: 2020-08-31 | Disposition: A | Discharge status: Home / Self Care | Payer: Medicare HMO | Source: Ambulatory Visit | Attending: Orthopaedic Surgery | Admitting: Orthopaedic Surgery

## 2020-08-31 ENCOUNTER — Encounter (HOSPITAL_COMMUNITY): Payer: Medicare HMO

## 2020-08-31 ENCOUNTER — Other Ambulatory Visit: Payer: Self-pay

## 2020-08-31 DIAGNOSIS — Z01818 Encounter for other preprocedural examination: Secondary | ICD-10-CM | POA: Insufficient documentation

## 2020-08-31 HISTORY — DX: Other complications of anesthesia, initial encounter: T88.59XA

## 2020-08-31 HISTORY — DX: Pneumonia, unspecified organism: J18.9

## 2020-08-31 LAB — URINALYSIS, ROUTINE W REFLEX MICROSCOPIC
Bilirubin Urine: NEGATIVE
Glucose, UA: NEGATIVE mg/dL
Hgb urine dipstick: NEGATIVE
Ketones, ur: NEGATIVE mg/dL
Leukocytes,Ua: NEGATIVE
Nitrite: NEGATIVE
Protein, ur: NEGATIVE mg/dL
Specific Gravity, Urine: 1.002 — ABNORMAL LOW (ref 1.005–1.030)
pH: 6 (ref 5.0–8.0)

## 2020-08-31 LAB — CBC WITH DIFFERENTIAL/PLATELET
Abs Immature Granulocytes: 0.01 10*3/uL (ref 0.00–0.07)
Basophils Absolute: 0.1 10*3/uL (ref 0.0–0.1)
Basophils Relative: 1 %
Eosinophils Absolute: 0.1 10*3/uL (ref 0.0–0.5)
Eosinophils Relative: 2 %
HCT: 42.8 % (ref 36.0–46.0)
Hemoglobin: 14 g/dL (ref 12.0–15.0)
Immature Granulocytes: 0 %
Lymphocytes Relative: 33 %
Lymphs Abs: 1.9 10*3/uL (ref 0.7–4.0)
MCH: 32.1 pg (ref 26.0–34.0)
MCHC: 32.7 g/dL (ref 30.0–36.0)
MCV: 98.2 fL (ref 80.0–100.0)
Monocytes Absolute: 0.7 10*3/uL (ref 0.1–1.0)
Monocytes Relative: 11 %
Neutro Abs: 3.1 10*3/uL (ref 1.7–7.7)
Neutrophils Relative %: 53 %
Platelets: 273 10*3/uL (ref 150–400)
RBC: 4.36 MIL/uL (ref 3.87–5.11)
RDW: 13 % (ref 11.5–15.5)
WBC: 5.9 10*3/uL (ref 4.0–10.5)
nRBC: 0 % (ref 0.0–0.2)

## 2020-08-31 LAB — TYPE AND SCREEN
ABO/RH(D): O POS
Antibody Screen: NEGATIVE

## 2020-08-31 LAB — BASIC METABOLIC PANEL
Anion gap: 9 (ref 5–15)
BUN: 12 mg/dL (ref 8–23)
CO2: 25 mmol/L (ref 22–32)
Calcium: 9.1 mg/dL (ref 8.9–10.3)
Chloride: 107 mmol/L (ref 98–111)
Creatinine, Ser: 0.52 mg/dL (ref 0.44–1.00)
GFR, Estimated: >60 mL/min (ref >60)
Glucose, Bld: 99 mg/dL (ref 70–99)
Potassium: 5.2 mmol/L — ABNORMAL HIGH (ref 3.5–5.1)
Sodium: 141 mmol/L (ref 135–145)

## 2020-08-31 LAB — PROTIME-INR
INR: 1 (ref 0.8–1.2)
Prothrombin Time: 12.4 seconds (ref 11.4–15.2)

## 2020-08-31 LAB — APTT: aPTT: 30 seconds (ref 24–36)

## 2020-08-31 LAB — SURGICAL PCR SCREEN
MRSA, PCR: NEGATIVE
Staphylococcus aureus: NEGATIVE

## 2020-08-31 NOTE — Progress Notes (Addendum)
Anesthesia Review:  PCP: DR Cathlean Cower  Cardiologist : Chest x-ray : 08/31/20 EKG :08/31/2020  Echo : Stress test: Cardiac Cath :  Activity level: can do a flight of stairs without difficulty  Sleep Study/ CPAP : uses oral apparatus  Fasting Blood Sugar :      / Checks Blood Sugar -- times a day:   Blood Thinner/ Instructions /Last Dose: ASA / Instructions/ Last Dose :  325 mg Aspirin prn - pt to call Dr Rhona Raider regarding Aspirin preop instructions  BMP done 08/31/2020 routed via epic to DR [facility].

## 2020-09-07 ENCOUNTER — Other Ambulatory Visit (HOSPITAL_COMMUNITY)
Admission: RE | Admit: 2020-09-07 | Discharge: 2020-09-07 | Disposition: A | Discharge status: Home / Self Care | Payer: Medicare HMO | Source: Ambulatory Visit | Attending: Orthopaedic Surgery | Admitting: Orthopaedic Surgery

## 2020-09-07 DIAGNOSIS — Z01812 Encounter for preprocedural laboratory examination: Secondary | ICD-10-CM | POA: Insufficient documentation

## 2020-09-07 DIAGNOSIS — Z20822 Contact with and (suspected) exposure to covid-19: Secondary | ICD-10-CM | POA: Diagnosis not present

## 2020-09-07 LAB — SARS CORONAVIRUS 2 (TAT 6-24 HRS): SARS Coronavirus 2: NEGATIVE

## 2020-09-10 MED ORDER — BUPIVACAINE LIPOSOME 1.3 % IJ SUSP
10.0000 mL | Freq: Once | INTRAMUSCULAR | Status: DC
  Filled 2020-09-10: qty 10

## 2020-09-10 MED ORDER — TRANEXAMIC ACID 1000 MG/10ML IV SOLN
2000.0000 mg | INTRAVENOUS | Status: DC
  Filled 2020-09-10 (×2): qty 20

## 2020-09-10 NOTE — H&P (Signed)
TOTAL HIP ADMISSION H&P  Patient is admitted for left total hip arthroplasty.  Subjective:  Chief Complaint: left hip pain  HPI: Crystal [name], 69 y.o. female, has a history of pain and functional disability in the left hip(s) due to arthritis and patient has failed non-surgical conservative treatments for greater than 12 weeks to include NSAID's and/or analgesics, corticosteriod injections, flexibility and strengthening excercises, use of assistive devices, weight reduction as appropriate and activity modification.  Onset of symptoms was gradual starting 5 years ago with gradually worsening course since that time.The patient noted no past surgery on the left hip(s).  Patient currently rates pain in the left hip at 10 out of 10 with activity. Patient has night pain, worsening of pain with activity and weight bearing, trendelenberg gait, pain that interfers with activities of daily living and crepitus. Patient has evidence of subchondral cysts, subchondral sclerosis, periarticular osteophytes and joint space narrowing by imaging studies. This condition presents safety issues increasing the risk of falls. There is no current active infection.  Patient Active Problem List   Diagnosis Date Noted  . Tonsillitis 05/09/2020  . Arthritis of left hip 04/04/2020  . Greater trochanteric bursitis of left hip 02/27/2020  . Benign head tremor 02/25/2020  . Vasomotor rhinitis 07/08/2018  . Allergic conjunctivitis 07/08/2018  . Bronchitis, recurrent ([place]) 06/28/2018  . Obstructive sleep apnea 06/25/2018  . Chronic serous otitis media 03/08/2018  . Snoring 03/08/2018  . Urinary incontinence 01/23/2018  . Fatty liver 10/19/2017  . Osteoporosis 07/30/2017  . Dysphagia 12/12/2016  . Breast pain 12/12/2016  . Genetic testing 03/18/2016  . Degenerative arthritis of knee, bilateral 02/28/2016  . Degenerative arthritis of left knee 02/27/2016  . Colon adenomas 02/22/2016  . Family history of breast cancer  in sister 02/22/2016  . Patellofemoral arthritis 09/03/2015  . Patellofemoral arthritis of left knee 07/24/2015  . Bilateral anterior knee pain 06/03/2012  . Family history of colon cancer 06/03/2012  . Impaired glucose tolerance 03/27/2011  . Preventative health care 03/27/2011  . Anxiety state 12/20/2009  . Depression 12/20/2009  . Essential hypertension 12/20/2009  . Other allergic rhinitis 12/20/2009  . GERD 12/20/2009  . IBS 12/20/2009  . Disorder of bone and cartilage 12/20/2009  . PERIPHERAL EDEMA 12/20/2009  . COLONIC POLYPS, HX OF 12/20/2009  . TOBACCO USE DISORDER/SMOKER-SMOKING CESSATION DISCUSSED 12/13/2007  . COPD 12/13/2007   Past Medical History:  Diagnosis Date  . ALLERGIC RHINITIS   . Allergy   . ANXIETY    no medations needed  . Arthritis   . Asthma   . Bronchitis   . Cataract    no surgery  . COLONIC POLYPS, HX OF   . Complication of anesthesia    oxygen sat dropped during endoscopy   . COPD    pt unsure of this dx  . Cough   . DEPRESSION   . Fatty liver   . GERD   . GLUCOSE INTOLERANCE   . HYPERTENSION   . IBS   . Impaired glucose tolerance   . Obstructive sleep apnea 06/25/2018  . OSTEOPENIA   . Osteopenia   . Osteoporosis 07/30/2017  . PERIPHERAL EDEMA   . Pneumonia   . Pneumonia, organism unspecified(486)   . Sleep apnea    Oral Apar[place]  . TOBACCO USE DISORDER/SMOKER-SMOKING CESSATION DISCUSSED     Past Surgical History:  Procedure Laterality Date  . birthmark removed     from back as a child  . bladder mesh  2012  . CESAREAN SECTION     x2  . CHOLECYSTECTOMY    . COLONOSCOPY  2018   TA  . MOUTH SURGERY  04/25/2020   tissue graft -room of mouth to front gums  . WISDOM TOOTH EXTRACTION      Current Facility-Administered Medications  Medication Dose Route Frequency Provider Last Rate Last Admin  . [START ON 09/11/2020] bupivacaine liposome (EXPAREL) 1.3 % injection 133 mg  10 mL Other Once Melrose Nakayama, MD      . Derrill Memo  ON 09/11/2020] tranexamic acid (CYKLOKAPRON) 2,000 mg in sodium chloride 0.9 % 50 mL Topical Application  8,850 mg Topical To OR Melrose Nakayama, MD       Current Outpatient Medications  Medication Sig Dispense Refill Last Dose  . albuterol (VENTOLIN HFA) 108 (90 Base) MCG/ACT inhaler Inhale 1-2 puffs into the lungs every 4 (four) hours as needed. 18 g 0   . aspirin 325 MG tablet Take 325 mg by mouth every 6 (six) hours as needed for moderate pain.     . Calcium Carb-Cholecalciferol (CALCIUM 600+D) 600-800 MG-UNIT TABS Take 1 tablet by mouth in the morning and at bedtime.      . Cholecalciferol (VITAMIN D PO) Take 2,000 Units by mouth daily.      Marland Kitchen denosumab (PROLIA) 60 MG/ML SOSY injection Inject 60 mg into the skin every 6 (six) months.     . fluticasone (FLONASE) 50 MCG/ACT nasal spray Place 2 sprays into both nostrils daily as needed for allergies or rhinitis.     . folic acid (FOLVITE) 277 MCG tablet Take 400 mcg by mouth daily.     Marland Kitchen losartan (COZAAR) 50 MG tablet Take 1 tablet (50 mg total) by mouth daily. 90 tablet 3   . metoprolol succinate (TOPROL-XL) 25 MG 24 hr tablet Take 1 tablet (25 mg total) by mouth daily. 90 tablet 3   . vitamin B-12 (CYANOCOBALAMIN) 1000 MCG tablet Take 1 tablet (1,000 mcg total) by mouth daily. 90 tablet 3   . Fexofenadine HCl (MUCINEX ALLERGY PO) Take 600 mg by mouth as needed. (Patient not taking: Reported on 08/27/2020)   Not Taking at Unknown time  . ibuprofen (ADVIL) 800 MG tablet Take by mouth. (Patient not taking: Reported on 08/27/2020)   Not Taking at Unknown time   Allergies  Allergen Reactions  . Ace Inhibitors Cough  . Penicillins Rash    Social History   Tobacco Use  . Smoking status: Current Every Day Smoker    Packs/day: 1.00    Years: 42.00    Pack years: 42.00    Types: Cigarettes    Start date: 20  . Smokeless tobacco: Never Used  Substance Use Topics  . Alcohol use: Yes    Alcohol/week: 21.0 standard drinks    Types: 7 Glasses  of wine, 14 Standard drinks or equivalent per week    Comment: couple of drinks 1-2 nites per week     Family History  Problem Relation Age of Onset  . Breast cancer Sister 40       +lump and tamoxifen; eventually had bilateral mastectomies  . Allergic rhinitis Sister   . Colon cancer Father        dx. >15  . Bladder Cancer Father        dx. 70-79; not a smoker  . Skin cancer Father        basal cell carcinoma  . Colon polyps Father   . Colon cancer Paternal Grandfather  unspecified age  . Melanoma Paternal Grandfather        unspecified age  . Skin cancer Mother        NOS; dx. 72s  . Other Mother        hx of hysterectomy in her early 37s for fibroid cysts  . Heart attack Maternal Uncle 67  . Heart attack Maternal Grandmother 62  . Thyroid cancer Paternal Grandmother 88  . Breast cancer Sister        dx. early 57s; s/p BL mastectomies  . Cervical cancer Sister        dx. late 21s  . Skin cancer Brother        NOS  . Anxiety disorder Other   . Hypothyroidism Other   . Hyperlipidemia Other   . Diabetes Other   . Esophageal cancer Neg Hx   . Stomach cancer Neg Hx   . Rectal cancer Neg Hx      Review of Systems  Musculoskeletal: Positive for arthralgias.       Left hip  All other systems reviewed and are negative.   Objective:  Physical Exam Constitutional:      Appearance: Normal appearance.  HENT:     Head: Normocephalic and atraumatic.     Nose: Nose normal.     Mouth/Throat:     Pharynx: Oropharynx is clear.  Eyes:     Extraocular Movements: Extraocular movements intact.  Cardiovascular:     Rate and Rhythm: Normal rate and regular rhythm.  Pulmonary:     Effort: Pulmonary effort is normal.  Abdominal:     Palpations: Abdomen is soft.  Musculoskeletal:     Cervical back: Normal range of motion.     Comments: Left hip motion is limited and extremely painful in internal rotation.  Her leg lengths are roughly equal.  She walks with a markedly  altered gait.  Sensation and motor function are intact distally.  She has palpable pulses in her feet.    Skin:    General: Skin is warm and dry.  Neurological:     General: No focal deficit present.     Mental Status: She is alert and oriented to person, place, and time.  Psychiatric:        Mood and Affect: Mood normal.        Behavior: Behavior normal.        Thought Content: Thought content normal.        Judgment: Judgment normal.     Vital signs in last 24 hours:    Labs:   Estimated body mass index is 34.4 kg/m as calculated from the following:   Height as of 08/31/20: 5' 3"  (1.6 m).   Weight as of 08/31/20: 88.1 kg.   Imaging Review Plain radiographs demonstrate severe degenerative joint disease of the left hip(s). The bone quality appears to be good for age and reported activity level.      Assessment/Plan:  End stage primary arthritis, left hip(s)  The patient history, physical examination, clinical judgement of the provider and imaging studies are consistent with end stage degenerative joint disease of the left hip(s) and total hip arthroplasty is deemed medically necessary. The treatment options including medical management, injection therapy, arthroscopy and arthroplasty were discussed at length. The risks and benefits of total hip arthroplasty were presented and reviewed. The risks due to aseptic loosening, infection, stiffness, dislocation/subluxation,  thromboembolic complications and other imponderables were discussed.  The patient acknowledged the explanation, agreed to proceed  with the plan and consent was signed. Patient is being admitted for inpatient treatment for surgery, pain control, PT, OT, prophylactic antibiotics, VTE prophylaxis, progressive ambulation and ADL's and discharge planning.The patient is planning to be discharged home with home health services

## 2020-09-10 NOTE — Anesthesia Preprocedure Evaluation (Addendum)
Anesthesia Evaluation  Patient identified by MRN, date of birth, ID band Patient awake    Reviewed: Allergy & Precautions, NPO status , Patient's Chart, lab work & pertinent test results  Airway Mallampati: II  TM Distance: >3 FB Neck ROM: Full    Dental no notable dental hx. (+) Teeth Intact, Dental Advisory Given   Pulmonary asthma , COPD, Current Smoker and Patient abstained from smoking.,    Pulmonary exam normal breath sounds clear to auscultation       Cardiovascular hypertension, Pt. on medications Normal cardiovascular exam Rhythm:Regular Rate:Normal     Neuro/Psych Anxiety negative neurological ROS     GI/Hepatic Neg liver ROS,   Endo/Other  negative endocrine ROS  Renal/GU K+ 5.2 Cr 0.52     Musculoskeletal  (+) Arthritis ,   Abdominal   Peds  Hematology Hgb 14.0 Plt 273   Anesthesia Other Findings   Reproductive/Obstetrics                           Anesthesia Physical Anesthesia Plan  ASA: III  Anesthesia Plan: Spinal   Post-op Pain Management:    Induction:   PONV Risk Score and Plan: 2 and Treatment may vary due to age or medical condition and Ondansetron  Airway Management Planned: Nasal Cannula and Natural Airway  Additional Equipment: None  Intra-op Plan:   Post-operative Plan:   Informed Consent: I have reviewed the patients History and Physical, chart, labs and discussed the procedure including the risks, benefits and alternatives for the proposed anesthesia with the patient or authorized representative who has indicated his/her understanding and acceptance.     Dental advisory given  Plan Discussed with: CRNA and Anesthesiologist  Anesthesia Plan Comments: (Spinal)       Anesthesia Quick Evaluation

## 2020-09-11 ENCOUNTER — Ambulatory Visit (HOSPITAL_COMMUNITY): Payer: Medicare HMO | Admitting: Certified Registered"

## 2020-09-11 ENCOUNTER — Encounter (HOSPITAL_COMMUNITY)
Admission: RE | Disposition: A | Discharge status: Home / Self Care | Payer: Self-pay | Source: Home / Self Care | Attending: Orthopaedic Surgery

## 2020-09-11 ENCOUNTER — Other Ambulatory Visit: Payer: Self-pay

## 2020-09-11 ENCOUNTER — Encounter (HOSPITAL_COMMUNITY): Payer: Self-pay | Admitting: Orthopaedic Surgery

## 2020-09-11 ENCOUNTER — Observation Stay (HOSPITAL_COMMUNITY)
Admission: RE | Admit: 2020-09-11 | Discharge: 2020-09-12 | Disposition: A | Discharge status: Home / Self Care | Payer: Medicare HMO | Attending: Orthopaedic Surgery | Admitting: Orthopaedic Surgery

## 2020-09-11 ENCOUNTER — Ambulatory Visit (HOSPITAL_COMMUNITY): Payer: Medicare HMO

## 2020-09-11 ENCOUNTER — Ambulatory Visit (HOSPITAL_COMMUNITY): Payer: Medicare HMO | Admitting: Physician Assistant

## 2020-09-11 DIAGNOSIS — Z419 Encounter for procedure for purposes other than remedying health state, unspecified: Secondary | ICD-10-CM

## 2020-09-11 DIAGNOSIS — J449 Chronic obstructive pulmonary disease, unspecified: Secondary | ICD-10-CM | POA: Insufficient documentation

## 2020-09-11 DIAGNOSIS — I10 Essential (primary) hypertension: Secondary | ICD-10-CM | POA: Diagnosis not present

## 2020-09-11 DIAGNOSIS — J45909 Unspecified asthma, uncomplicated: Secondary | ICD-10-CM | POA: Insufficient documentation

## 2020-09-11 DIAGNOSIS — M1612 Unilateral primary osteoarthritis, left hip: Secondary | ICD-10-CM | POA: Diagnosis not present

## 2020-09-11 DIAGNOSIS — Z7982 Long term (current) use of aspirin: Secondary | ICD-10-CM | POA: Insufficient documentation

## 2020-09-11 DIAGNOSIS — F17210 Nicotine dependence, cigarettes, uncomplicated: Secondary | ICD-10-CM | POA: Insufficient documentation

## 2020-09-11 DIAGNOSIS — G4733 Obstructive sleep apnea (adult) (pediatric): Secondary | ICD-10-CM | POA: Diagnosis not present

## 2020-09-11 DIAGNOSIS — Z79899 Other long term (current) drug therapy: Secondary | ICD-10-CM | POA: Diagnosis not present

## 2020-09-11 DIAGNOSIS — M25552 Pain in left hip: Secondary | ICD-10-CM | POA: Diagnosis present

## 2020-09-11 DIAGNOSIS — Z86010 Personal history of colonic polyps: Secondary | ICD-10-CM | POA: Diagnosis not present

## 2020-09-11 DIAGNOSIS — Z96642 Presence of left artificial hip joint: Secondary | ICD-10-CM | POA: Diagnosis not present

## 2020-09-11 DIAGNOSIS — Z471 Aftercare following joint replacement surgery: Secondary | ICD-10-CM | POA: Diagnosis not present

## 2020-09-11 HISTORY — PX: TOTAL HIP ARTHROPLASTY: SHX124

## 2020-09-11 SURGERY — ARTHROPLASTY, HIP, TOTAL, ANTERIOR APPROACH
Anesthesia: Spinal | Site: Hip | Laterality: Left

## 2020-09-11 MED ORDER — ONDANSETRON HCL 4 MG/2ML IJ SOLN: 4.0000 mg | Freq: Four times a day (QID) | INTRAMUSCULAR | Status: DC | PRN

## 2020-09-11 MED ORDER — FENTANYL CITRATE (PF) 100 MCG/2ML IJ SOLN
25.0000 ug | INTRAMUSCULAR | Status: DC | PRN
  Administered 2020-09-11 (×2): 50 ug via INTRAVENOUS

## 2020-09-11 MED ORDER — ONDANSETRON HCL 4 MG/2ML IJ SOLN: 4.0000 mg | Freq: Once | INTRAMUSCULAR | Status: DC | PRN

## 2020-09-11 MED ORDER — TRANEXAMIC ACID 1000 MG/10ML IV SOLN
INTRAVENOUS | Status: DC | PRN
  Administered 2020-09-11: 2000 mg via TOPICAL

## 2020-09-11 MED ORDER — HYDROCODONE-ACETAMINOPHEN 5-325 MG PO TABS: 1.0000 | ORAL_TABLET | ORAL | Status: DC | PRN

## 2020-09-11 MED ORDER — METOCLOPRAMIDE HCL 5 MG PO TABS: 5.0000 mg | ORAL_TABLET | Freq: Three times a day (TID) | ORAL | Status: DC | PRN

## 2020-09-11 MED ORDER — ALBUTEROL SULFATE HFA 108 (90 BASE) MCG/ACT IN AERS
INHALATION_SPRAY | RESPIRATORY_TRACT | Status: AC
  Filled 2020-09-11: qty 6.7

## 2020-09-11 MED ORDER — PROPOFOL 1000 MG/100ML IV EMUL
INTRAVENOUS | Status: AC
  Filled 2020-09-11: qty 200

## 2020-09-11 MED ORDER — LACTATED RINGERS IV SOLN: INTRAVENOUS | Status: DC

## 2020-09-11 MED ORDER — METHOCARBAMOL 500 MG PO TABS
500.0000 mg | ORAL_TABLET | Freq: Four times a day (QID) | ORAL | Status: DC | PRN
  Administered 2020-09-11: 500 mg via ORAL
  Filled 2020-09-11: qty 1

## 2020-09-11 MED ORDER — ACETAMINOPHEN 500 MG PO TABS
500.0000 mg | ORAL_TABLET | Freq: Four times a day (QID) | ORAL | Status: AC
  Filled 2020-09-11: qty 1

## 2020-09-11 MED ORDER — MORPHINE SULFATE (PF) 4 MG/ML IV SOLN
INTRAVENOUS | Status: AC
  Filled 2020-09-11: qty 1

## 2020-09-11 MED ORDER — ALBUTEROL SULFATE HFA 108 (90 BASE) MCG/ACT IN AERS
INHALATION_SPRAY | RESPIRATORY_TRACT | Status: DC | PRN
  Administered 2020-09-11: 2 via RESPIRATORY_TRACT

## 2020-09-11 MED ORDER — ROCURONIUM BROMIDE 10 MG/ML (PF) SYRINGE
PREFILLED_SYRINGE | INTRAVENOUS | Status: AC
  Filled 2020-09-11: qty 20

## 2020-09-11 MED ORDER — DOCUSATE SODIUM 100 MG PO CAPS
100.0000 mg | ORAL_CAPSULE | Freq: Two times a day (BID) | ORAL | Status: DC
  Administered 2020-09-11 – 2020-09-12 (×2): 100 mg via ORAL
  Filled 2020-09-11 (×2): qty 1

## 2020-09-11 MED ORDER — CEFAZOLIN SODIUM-DEXTROSE 2-4 GM/100ML-% IV SOLN
2.0000 g | Freq: Four times a day (QID) | INTRAVENOUS | Status: AC
  Administered 2020-09-11 (×2): 2 g via INTRAVENOUS
  Filled 2020-09-11 (×2): qty 100

## 2020-09-11 MED ORDER — HYDROCODONE-ACETAMINOPHEN 5-325 MG PO TABS
1.0000 | ORAL_TABLET | Freq: Four times a day (QID) | ORAL | 0 refills | Status: DC | PRN
Start: 2020-09-11 — End: 2021-06-03

## 2020-09-11 MED ORDER — LACTATED RINGERS IV BOLUS
250.0000 mL | Freq: Once | INTRAVENOUS | Status: AC
  Administered 2020-09-11: 250 mL via INTRAVENOUS

## 2020-09-11 MED ORDER — BUPIVACAINE-EPINEPHRINE (PF) 0.25% -1:200000 IJ SOLN
INTRAMUSCULAR | Status: AC
  Filled 2020-09-11: qty 30

## 2020-09-11 MED ORDER — LACTATED RINGERS IV BOLUS
500.0000 mL | Freq: Once | INTRAVENOUS | Status: AC
  Administered 2020-09-11: 500 mL via INTRAVENOUS

## 2020-09-11 MED ORDER — ALBUMIN HUMAN 5 % IV SOLN
INTRAVENOUS | Status: AC
  Filled 2020-09-11: qty 250

## 2020-09-11 MED ORDER — ACETAMINOPHEN 10 MG/ML IV SOLN: 1000.0000 mg | Freq: Once | INTRAVENOUS | Status: DC | PRN

## 2020-09-11 MED ORDER — DIPHENHYDRAMINE HCL 12.5 MG/5ML PO ELIX: 12.5000 mg | ORAL_SOLUTION | ORAL | Status: DC | PRN

## 2020-09-11 MED ORDER — LIDOCAINE 2% (20 MG/ML) 5 ML SYRINGE
INTRAMUSCULAR | Status: DC | PRN
  Administered 2020-09-11: 40 mg via INTRAVENOUS

## 2020-09-11 MED ORDER — LOSARTAN POTASSIUM 50 MG PO TABS
50.0000 mg | ORAL_TABLET | Freq: Every day | ORAL | Status: DC
  Administered 2020-09-11 – 2020-09-12 (×2): 50 mg via ORAL
  Filled 2020-09-11 (×2): qty 1

## 2020-09-11 MED ORDER — GLYCOPYRROLATE PF 0.2 MG/ML IJ SOSY
PREFILLED_SYRINGE | INTRAMUSCULAR | Status: AC
  Filled 2020-09-11: qty 1

## 2020-09-11 MED ORDER — BISACODYL 5 MG PO TBEC: 5.0000 mg | DELAYED_RELEASE_TABLET | Freq: Every day | ORAL | Status: DC | PRN

## 2020-09-11 MED ORDER — ACETAMINOPHEN 325 MG PO TABS: 325.0000 mg | ORAL_TABLET | Freq: Four times a day (QID) | ORAL | Status: DC | PRN

## 2020-09-11 MED ORDER — ONDANSETRON HCL 4 MG PO TABS: 4.0000 mg | ORAL_TABLET | Freq: Four times a day (QID) | ORAL | Status: DC | PRN

## 2020-09-11 MED ORDER — MORPHINE SULFATE (PF) 4 MG/ML IV SOLN
0.5000 mg | INTRAVENOUS | Status: DC | PRN
  Administered 2020-09-11: 1 mg via INTRAVENOUS

## 2020-09-11 MED ORDER — FENTANYL CITRATE (PF) 100 MCG/2ML IJ SOLN
INTRAMUSCULAR | Status: AC
  Filled 2020-09-11: qty 2

## 2020-09-11 MED ORDER — MEPIVACAINE HCL (PF) 2 % IJ SOLN
INTRAMUSCULAR | Status: AC
  Filled 2020-09-11: qty 20

## 2020-09-11 MED ORDER — METOPROLOL SUCCINATE ER 25 MG PO TB24
25.0000 mg | ORAL_TABLET | Freq: Every day | ORAL | Status: DC
  Administered 2020-09-12: 25 mg via ORAL
  Filled 2020-09-11: qty 1

## 2020-09-11 MED ORDER — METOCLOPRAMIDE HCL 5 MG/ML IJ SOLN: 5.0000 mg | Freq: Three times a day (TID) | INTRAMUSCULAR | Status: DC | PRN

## 2020-09-11 MED ORDER — FLUTICASONE PROPIONATE 50 MCG/ACT NA SUSP: 2.0000 | Freq: Every day | NASAL | Status: DC | PRN

## 2020-09-11 MED ORDER — PHENYLEPHRINE HCL-NACL 10-0.9 MG/250ML-% IV SOLN
INTRAVENOUS | Status: DC | PRN
  Administered 2020-09-11: 25 ug/min via INTRAVENOUS

## 2020-09-11 MED ORDER — 0.9 % SODIUM CHLORIDE (POUR BTL) OPTIME
TOPICAL | Status: DC | PRN
  Administered 2020-09-11: 1000 mL

## 2020-09-11 MED ORDER — ASPIRIN 81 MG PO CHEW: 81.0000 mg | CHEWABLE_TABLET | Freq: Two times a day (BID) | ORAL | Status: DC

## 2020-09-11 MED ORDER — METHOCARBAMOL 500 MG IVPB - SIMPLE MED
500.0000 mg | Freq: Four times a day (QID) | INTRAVENOUS | Status: DC | PRN
  Administered 2020-09-11: 500 mg via INTRAVENOUS
  Filled 2020-09-11: qty 50

## 2020-09-11 MED ORDER — PROPOFOL 10 MG/ML IV BOLUS
INTRAVENOUS | Status: DC | PRN
  Administered 2020-09-11: 20 mg via INTRAVENOUS

## 2020-09-11 MED ORDER — HYDROCODONE-ACETAMINOPHEN 7.5-325 MG PO TABS
1.0000 | ORAL_TABLET | ORAL | Status: DC | PRN
  Administered 2020-09-11 – 2020-09-12 (×6): 2 via ORAL
  Filled 2020-09-11 (×5): qty 2

## 2020-09-11 MED ORDER — HYDROCODONE-ACETAMINOPHEN 7.5-325 MG PO TABS
ORAL_TABLET | ORAL | Status: AC
  Filled 2020-09-11: qty 2

## 2020-09-11 MED ORDER — STERILE WATER FOR IRRIGATION IR SOLN
Status: DC | PRN
  Administered 2020-09-11: 2000 mL

## 2020-09-11 MED ORDER — ONDANSETRON HCL 4 MG/2ML IJ SOLN
INTRAMUSCULAR | Status: DC | PRN
  Administered 2020-09-11: 4 mg via INTRAVENOUS

## 2020-09-11 MED ORDER — EPHEDRINE 5 MG/ML INJ
INTRAVENOUS | Status: AC
  Filled 2020-09-11: qty 10

## 2020-09-11 MED ORDER — CEFAZOLIN SODIUM-DEXTROSE 2-4 GM/100ML-% IV SOLN
INTRAVENOUS | Status: AC
  Filled 2020-09-11: qty 100

## 2020-09-11 MED ORDER — PROPOFOL 500 MG/50ML IV EMUL
INTRAVENOUS | Status: AC
  Filled 2020-09-11: qty 50

## 2020-09-11 MED ORDER — VANCOMYCIN HCL IN DEXTROSE 1-5 GM/200ML-% IV SOLN
1000.0000 mg | INTRAVENOUS | Status: AC
  Administered 2020-09-11: 1000 mg via INTRAVENOUS
  Filled 2020-09-11: qty 200

## 2020-09-11 MED ORDER — PHENYLEPHRINE 40 MCG/ML (10ML) SYRINGE FOR IV PUSH (FOR BLOOD PRESSURE SUPPORT)
PREFILLED_SYRINGE | INTRAVENOUS | Status: AC
  Filled 2020-09-11: qty 20

## 2020-09-11 MED ORDER — PHENOL 1.4 % MT LIQD: 1.0000 | OROMUCOSAL | Status: DC | PRN

## 2020-09-11 MED ORDER — CEFAZOLIN SODIUM-DEXTROSE 2-3 GM-%(50ML) IV SOLR
INTRAVENOUS | Status: DC | PRN
  Administered 2020-09-11: 2 g via INTRAVENOUS

## 2020-09-11 MED ORDER — MIDAZOLAM HCL 2 MG/2ML IJ SOLN
INTRAMUSCULAR | Status: AC
  Filled 2020-09-11: qty 2

## 2020-09-11 MED ORDER — ASPIRIN EC 81 MG PO TBEC: 81.0000 mg | DELAYED_RELEASE_TABLET | Freq: Two times a day (BID) | ORAL | 0 refills | Status: AC

## 2020-09-11 MED ORDER — TIZANIDINE HCL 4 MG PO TABS: 4.0000 mg | ORAL_TABLET | Freq: Four times a day (QID) | ORAL | 1 refills | Status: DC | PRN

## 2020-09-11 MED ORDER — DEXAMETHASONE SODIUM PHOSPHATE 10 MG/ML IJ SOLN
INTRAMUSCULAR | Status: AC
  Filled 2020-09-11: qty 1

## 2020-09-11 MED ORDER — LIDOCAINE 2% (20 MG/ML) 5 ML SYRINGE
INTRAMUSCULAR | Status: AC
  Filled 2020-09-11: qty 15

## 2020-09-11 MED ORDER — MENTHOL 3 MG MT LOZG: 1.0000 | LOZENGE | OROMUCOSAL | Status: DC | PRN

## 2020-09-11 MED ORDER — ALBUTEROL SULFATE HFA 108 (90 BASE) MCG/ACT IN AERS: 1.0000 | INHALATION_SPRAY | RESPIRATORY_TRACT | Status: DC | PRN

## 2020-09-11 MED ORDER — BUPIVACAINE LIPOSOME 1.3 % IJ SUSP
INTRAMUSCULAR | Status: DC | PRN
  Administered 2020-09-11: 10 mL

## 2020-09-11 MED ORDER — ALUM & MAG HYDROXIDE-SIMETH 200-200-20 MG/5ML PO SUSP: 30.0000 mL | ORAL | Status: DC | PRN

## 2020-09-11 MED ORDER — POVIDONE-IODINE 10 % EX SWAB
2.0000 "application " | Freq: Once | CUTANEOUS | Status: AC
  Administered 2020-09-11: 2 via TOPICAL

## 2020-09-11 MED ORDER — FOLIC ACID 1 MG PO TABS: 500.0000 ug | ORAL_TABLET | Freq: Every day | ORAL | Status: DC

## 2020-09-11 MED ORDER — PROPOFOL 500 MG/50ML IV EMUL
INTRAVENOUS | Status: DC | PRN
  Administered 2020-09-11: 50 ug/kg/min via INTRAVENOUS

## 2020-09-11 MED ORDER — MIDAZOLAM HCL 2 MG/2ML IJ SOLN
INTRAMUSCULAR | Status: DC | PRN
  Administered 2020-09-11 (×2): 1 mg via INTRAVENOUS

## 2020-09-11 MED ORDER — FENTANYL CITRATE (PF) 250 MCG/5ML IJ SOLN
INTRAMUSCULAR | Status: DC | PRN
  Administered 2020-09-11: 50 ug via INTRAVENOUS

## 2020-09-11 MED ORDER — TRANEXAMIC ACID-NACL 1000-0.7 MG/100ML-% IV SOLN
1000.0000 mg | INTRAVENOUS | Status: AC
  Administered 2020-09-11: 1000 mg via INTRAVENOUS
  Filled 2020-09-11: qty 100

## 2020-09-11 MED ORDER — KETOROLAC TROMETHAMINE 15 MG/ML IJ SOLN
7.5000 mg | Freq: Four times a day (QID) | INTRAMUSCULAR | Status: AC
  Administered 2020-09-11 (×2): 7.5 mg via INTRAVENOUS
  Filled 2020-09-11 (×2): qty 1

## 2020-09-11 MED ORDER — BUPIVACAINE-EPINEPHRINE (PF) 0.25% -1:200000 IJ SOLN
INTRAMUSCULAR | Status: DC | PRN
  Administered 2020-09-11: 30 mL

## 2020-09-11 MED ORDER — ONDANSETRON HCL 4 MG/2ML IJ SOLN
INTRAMUSCULAR | Status: AC
  Filled 2020-09-11: qty 4

## 2020-09-11 MED ORDER — PROPOFOL 10 MG/ML IV BOLUS
INTRAVENOUS | Status: AC
  Filled 2020-09-11: qty 20

## 2020-09-11 MED ORDER — PHENYLEPHRINE HCL (PRESSORS) 10 MG/ML IV SOLN
INTRAVENOUS | Status: AC
  Filled 2020-09-11: qty 3

## 2020-09-11 MED ORDER — TRANEXAMIC ACID-NACL 1000-0.7 MG/100ML-% IV SOLN: 1000.0000 mg | Freq: Once | INTRAVENOUS | Status: DC

## 2020-09-11 MED ORDER — METHOCARBAMOL 500 MG IVPB - SIMPLE MED
INTRAVENOUS | Status: AC
  Filled 2020-09-11: qty 50

## 2020-09-11 MED ORDER — FENTANYL CITRATE (PF) 250 MCG/5ML IJ SOLN
INTRAMUSCULAR | Status: AC
  Filled 2020-09-11: qty 5

## 2020-09-11 SURGICAL SUPPLY — 44 items
BAG DECANTER FOR FLEXI CONT (MISCELLANEOUS) ×2 IMPLANT
BLADE SAW SGTL 18X1.27X75 (BLADE) ×2 IMPLANT
BOOTIES KNEE HIGH SLOAN (MISCELLANEOUS) ×2 IMPLANT
CELLS DAT CNTRL 66122 CELL SVR (MISCELLANEOUS) ×1 IMPLANT
COVER PERINEAL POST (MISCELLANEOUS) ×2 IMPLANT
COVER SURGICAL LIGHT HANDLE (MISCELLANEOUS) ×2 IMPLANT
COVER WAND RF STERILE (DRAPES) ×2 IMPLANT
CUP ACETABULAR GRIPTON 100 52 (Orthopedic Implant) ×1 IMPLANT
DECANTER SPIKE VIAL GLASS SM (MISCELLANEOUS) ×2 IMPLANT
DRAPE IMP U-DRAPE 54X76 (DRAPES) ×2 IMPLANT
DRAPE ORTHO SPLIT 77X108 STRL (DRAPES)
DRAPE STERI IOBAN 125X83 (DRAPES) ×2 IMPLANT
DRAPE SURG ORHT 6 SPLT 77X108 (DRAPES) IMPLANT
DRAPE U-SHAPE 47X51 STRL (DRAPES) ×4 IMPLANT
DRSG AQUACEL AG ADV 3.5X 6 (GAUZE/BANDAGES/DRESSINGS) ×2 IMPLANT
DURAPREP 26ML APPLICATOR (WOUND CARE) ×2 IMPLANT
ELECT BLADE TIP CTD 4 INCH (ELECTRODE) ×2 IMPLANT
ELECT REM PT RETURN 15FT ADLT (MISCELLANEOUS) ×2 IMPLANT
ELIMINATOR HOLE APEX DEPUY (Hips) ×2 IMPLANT
GLOVE BIO SURGEON STRL SZ8 (GLOVE) ×4 IMPLANT
GLOVE BIOGEL PI IND STRL 8 (GLOVE) ×2 IMPLANT
GLOVE BIOGEL PI INDICATOR 8 (GLOVE) ×2
GOWN STRL REUS W/TWL XL LVL3 (GOWN DISPOSABLE) ×4 IMPLANT
GRIPTON 100 52 (Orthopedic Implant) ×2 IMPLANT
HEAD CERAMIC DELTA 36 PLUS 1.5 (Hips) ×2 IMPLANT
HOLDER FOLEY CATH W/STRAP (MISCELLANEOUS) ×2 IMPLANT
KIT TURNOVER KIT A (KITS) IMPLANT
LINER ACETAB NEUTRAL 36ID 520D (Liner) ×2 IMPLANT
MANIFOLD NEPTUNE II (INSTRUMENTS) ×2 IMPLANT
NEEDLE HYPO 22GX1.5 SAFETY (NEEDLE) ×2 IMPLANT
NS IRRIG 1000ML POUR BTL (IV SOLUTION) ×2 IMPLANT
PACK ANTERIOR HIP CUSTOM (KITS) ×2 IMPLANT
PENCIL SMOKE EVACUATOR (MISCELLANEOUS) IMPLANT
PROTECTOR NERVE ULNAR (MISCELLANEOUS) ×2 IMPLANT
RTRCTR WOUND ALEXIS 18CM MED (MISCELLANEOUS) ×2
STEM FEMORAL SZ5 HIGH ACTIS (Stem) ×2 IMPLANT
SUT ETHIBOND NAB CT1 #1 30IN (SUTURE) ×4 IMPLANT
SUT VIC AB 1 CT1 36 (SUTURE) ×4 IMPLANT
SUT VIC AB 2-0 CT1 27 (SUTURE) ×2
SUT VIC AB 2-0 CT1 TAPERPNT 27 (SUTURE) ×1 IMPLANT
SUT VICRYL AB 3-0 FS1 BRD 27IN (SUTURE) ×2 IMPLANT
SUT VLOC 180 0 24IN GS25 (SUTURE) ×2 IMPLANT
SYR 50ML LL SCALE MARK (SYRINGE) ×2 IMPLANT
TRAY FOLEY MTR SLVR 16FR STAT (SET/KITS/TRAYS/PACK) ×2 IMPLANT

## 2020-09-11 NOTE — Transfer of Care (Signed)
Immediate Anesthesia Transfer of Care Note  Patient: Crystal [name]  Procedure(s) Performed: LEFT TOTAL HIP ARTHROPLASTY ANTERIOR APPROACH (Left Hip)  Patient Location: PACU  Anesthesia Type:Spinal  Level of Consciousness: awake and alert   Airway & Oxygen Therapy: Patient Spontanous Breathing and Patient connected to face mask oxygen  Post-op Assessment: Report given to RN and Post -op Vital signs reviewed and stable  Post vital signs: Reviewed and stable  Last Vitals:  Vitals Value Taken Time  BP 103/63 09/11/20 0918  Temp    Pulse 77 09/11/20 0919  Resp 17 09/11/20 0919  SpO2 100 % 09/11/20 0919  Vitals shown include unvalidated device data.  Last Pain:  Vitals:   09/11/20 0545  TempSrc: Oral         Complications: No complications documented.

## 2020-09-11 NOTE — Anesthesia Procedure Notes (Signed)
Spinal  Patient location during procedure: OR End time: 09/11/2020 7:40 AM Staffing Performed: resident/CRNA  Resident/CRNA: Cynda Familia, CRNA Preanesthetic Checklist Completed: patient identified, IV checked, site marked, risks and benefits discussed, surgical consent, monitors and equipment checked, pre-op evaluation and timeout performed Spinal Block Patient position: sitting Prep: ChloraPrep and site prepped and draped Patient monitoring: heart rate, continuous pulse ox, blood pressure and cardiac monitor Approach: midline Location: L3-4 Injection technique: single-shot Needle Needle type: Sprotte  Needle gauge: 24 G Assessment Sensory level: T4 Additional Notes Expiration date of tray noted and within date.   Patient tolerated procedure well. Prep dry at time of insertion-- Dr Valma Cava present assisted and supervised procedure

## 2020-09-11 NOTE — Op Note (Signed)
PRE-OP DIAGNOSIS:  LEFT HIP DEGENERATIVE JOINT DISEASE POST-OP DIAGNOSIS: same PROCEDURE:  LEFT TOTAL HIP ARTHROPLASTY ANTERIOR APPROACH ANESTHESIA:  Spinal and MAC SURGEON:  Melrose Nakayama MD ASSISTANT:  Loni Dolly PA-C   INDICATIONS FOR PROCEDURE:  The patient is a 69 y.o. female with a long history of a painful hip.  This has persisted despite multiple conservative measures.  The patient has persisted with pain and dysfunction making rest and activity difficult.  A total hip replacement is offered as surgical treatment.  Informed operative consent was obtained after discussion of possible complications including reaction to anesthesia, infection, neurovascular injury, dislocation, DVT, PE, and death.  The importance of the postoperative rehab program to optimize result was stressed with the patient.  SUMMARY OF FINDINGS AND PROCEDURE:  Under the above anesthesia through a anterior approach an the Hana table a left THR was performed.  The patient had severe degenerative change and good bone quality.  We used DePuy components to replace the hip and these were size 5 high offset Actis femur capped with a +1.5 49m ceramic hip ball.  On the acetabular side we used a size 52 Gription shell with a plus 0 neutral polyethylene liner.  We did use a hole eliminator.  ALoni DollyPA-C assisted throughout and was invaluable to the completion of the case in that he helped position and retract while I performed the procedure.  He also closed simultaneously to help minimize OR time.  I used fluoroscopy throughout the case to check position of components and leg lengths and read all these views myself.  DESCRIPTION OF PROCEDURE:  The patient was taken to the OR suite where the above anesthetic was applied.  The patient was then positioned on the Hana table supine.  All bony prominences were appropriately padded.  Prep and drape was then performed in normal sterile fashion.  The patient was given vancomycin and  cefazolin preoperative antibiotics and an appropriate time out was performed.  We then took an anterior approach to the left hip.  Dissection was taken through adipose to the tensor fascia lata fascia.  This structure was incised longitudinally and we dissected in the intermuscular interval just medial to this muscle.  Cobra retractors were placed superior and inferior to the femoral neck superficial to the capsule.  A capsular incision was then made and the retractors were placed along the femoral neck.  Xray was brought in to get a good level for the femoral neck cut which was made with an oscillating saw and osteotome.  The femoral head was removed with a corkscrew.  The acetabulum was exposed and some labral tissues were excised. Reaming was taken to the inside wall of the pelvis and sequentially up to 1 mm smaller than the actual component.  A trial of components was done and then the aforementioned acetabular shell was placed in appropriate tilt and anteversion confirmed by fluoroscopy. The liner was placed along with the hole eliminator and attention was turned to the femur.  The leg was brought down and over into adduction and the elevator bar was used to raise the femur up gently in the wound.  The piriformis was released with care taken to preserve the obturator internus attachment and all of the posterior capsule. The femur was reamed and then broached to the appropriate size.  A trial reduction was done and the aforementioned head and neck assembly gave u[place]oreathe best stability in extension with external rotation.  Leg lengths were felt to be  about equal by fluoroscopic exam.  The trial components were removed and the wound irrigated.  We then placed the femoral component in appropriate anteversion.  The head was applied to a dry stem neck and the hip again reduced.  It was again stable in the aforementioned position.  The would was irrigated again followed by re-approximation of anterior capsule with  ethibond suture. Tensor fascia was repaired with V-loc suture  followed by deep closure with #O and #2 undyed vicryl.  Skin was closed with subQ stitch and steristrips followed by a sterile dressing.  EBL and IOF can be obtained from anesthesia records.  DISPOSITION:  The patient was extubated in the OR and taken to PACU in stable condition to be admitted to the Orthopedic Surgery for appropriate post-op care to include perioperative antibiotics and DVT prophylaxis.

## 2020-09-11 NOTE — Anesthesia Postprocedure Evaluation (Signed)
Anesthesia Post Note  Patient: Crystal [name]  Procedure(s) Performed: LEFT TOTAL HIP ARTHROPLASTY ANTERIOR APPROACH (Left Hip)     Patient location during evaluation: Nursing Unit Anesthesia Type: Spinal Level of consciousness: oriented and awake and alert Pain management: pain level controlled Vital Signs Assessment: post-procedure vital signs reviewed and stable Respiratory status: spontaneous breathing and respiratory function stable Cardiovascular status: blood pressure returned to baseline and stable Postop Assessment: no headache, no backache, no apparent nausea or vomiting and patient able to bend at knees Anesthetic complications: no   No complications documented.  Last Vitals:  Vitals:   09/11/20 1030 09/11/20 1100  BP: 115/70 116/67  Pulse: 79 80  Resp: 18   Temp:    SpO2: 97% 98%    Last Pain:  Vitals:   09/11/20 1100  TempSrc:   PainSc: 0-No pain                 Barnet Glasgow

## 2020-09-11 NOTE — Anesthesia Procedure Notes (Signed)
Date/Time: 09/11/2020 7:29 AM Performed by: Cynda Familia, CRNA Pre-anesthesia Checklist: Patient identified, Emergency Drugs available, Suction available, Patient being monitored and Timeout performed Patient Re-evaluated:Patient Re-evaluated prior to induction Oxygen Delivery Method: Simple face mask Placement Confirmation: positive ETCO2 and breath sounds checked- equal and bilateral Tube secured with: Tape Dental Injury: Teeth and Oropharynx as per pre-operative assessment

## 2020-09-11 NOTE — Anesthesia Postprocedure Evaluation (Signed)
Anesthesia Post Note  Patient: Crystal [name]  Procedure(s) Performed: LEFT TOTAL HIP ARTHROPLASTY ANTERIOR APPROACH (Left Hip)     Anesthesia Post Evaluation No complications documented.  Last Vitals:  Vitals:   09/11/20 1030 09/11/20 1100  BP: 115/70 116/67  Pulse: 79 80  Resp: 18   Temp:    SpO2: 97% 98%    Last Pain:  Vitals:   09/11/20 1100  TempSrc:   PainSc: 0-No pain                 Barnet Glasgow

## 2020-09-11 NOTE — Evaluation (Signed)
Physical Therapy Evaluation Patient Details Name: Crystal [name] MRN: [id number] DOB: 01-Jan-1951 Today's Date: 09/11/2020   History of Present Illness  Patient is 69 y.o. female s/p Lt THA anterior approach on 09/11/20 with PMH for osteoporosis, HTN, GERD, depression, COPD, anxiety.    Clinical Impression  Crystal [name] is a 69 y.o. female POD 0 s/p Lt THA. Patient reports independence with mobility at baseline. Patient is now limited by functional impairments (see PT problem list below) and requires min guard for transfers and gait with RW. Patient was able to ambulate ~90 feet with RW and min guard for safety. Patient c/o dizziness after sit<>stand from toilet and min assist to walk to WC, BP had dropped to 86/64 mmHg. BP improved to 111/81 after pt returned to bed. Patient will benefit from continued skilled PT interventions to address impairments and progress towards PLOF. Acute PT will follow to progress mobility and stair training in preparation for safe discharge home.     Follow Up Recommendations Follow surgeon's recommendation for DC plan and follow-up therapies    Equipment Recommendations  None recommended by PT    Recommendations for Other Services       Precautions / Restrictions Precautions Precautions: Fall Restrictions Weight Bearing Restrictions: No Other Position/Activity Restrictions: WBAT      Mobility  Bed Mobility Overal bed mobility: Needs Assistance Bed Mobility: Supine to Sit;Sit to Supine     Supine to sit: Min guard Sit to supine: Min assist   General bed mobility comments: guard for safety and pt taking extra time to sit up EOB. Light assist to bring Lt LE onto bed at EOS.    Transfers Overall transfer level: Needs assistance Equipment used: Rolling walker (2 wheeled) Transfers: Sit to/from Stand Sit to Stand: Min guard         General transfer comment: cues for hand placement/technique with RW, min guard for safety with rise from  EOB, toilet, and WC.   Ambulation/Gait Ambulation/Gait assistance: Min guard Gait Distance (Feet): 90 Feet Assistive device: Rolling walker (2 wheeled) Gait Pattern/deviations: Step-to pattern;Decreased stride length;Decreased weight shift to left Gait velocity: decr   General Gait Details: cues for safe proximity to RW, pt maintained throughout. pt with safe step to pattern. pt c/o dizziness after standing up and walking in bathroom and seated rest provided.  Stairs            Wheelchair Mobility    Modified Rankin (Stroke Patients Only)       Balance Overall balance assessment: Needs assistance Sitting-balance support: Feet supported Sitting balance-Leahy Scale: Good     Standing balance support: During functional activity;Bilateral upper extremity supported Standing balance-Leahy Scale: Fair                               Pertinent Vitals/Pain Pain Assessment: 0-10 Pain Score: 7  Pain Location: Lt hip Pain Descriptors / Indicators: Aching;Burning Pain Intervention(s): Limited activity within patient's tolerance;Monitored during session;Premedicated before session;Repositioned;Ice applied    Home Living Family/patient expects to be discharged to:: Private residence Living Arrangements: Alone Available Help at Discharge: Family Type of Home: House Home Access: Stairs to enter Entrance Stairs-Rails: None Entrance Stairs-Number of Steps: 2+1 Home Layout: One level Home Equipment: Cane - single point;Walker - 2 wheels;Toilet riser;Grab bars - tub/shower      Prior Function Level of Independence: Independent with assistive device(s)         Comments: using  SPC since july due to pain     Hand Dominance   Dominant Hand: Right    Extremity/Trunk Assessment   Upper Extremity Assessment Upper Extremity Assessment: Overall WFL for tasks assessed    Lower Extremity Assessment Lower Extremity Assessment: Overall WFL for tasks assessed;LLE  deficits/detail LLE: Unable to fully assess due to pain LLE Sensation: WNL LLE Coordination: WNL    Cervical / Trunk Assessment Cervical / Trunk Assessment: Normal  Communication   Communication: No difficulties  Cognition Arousal/Alertness: Awake/alert Behavior During Therapy: WFL for tasks assessed/performed Overall Cognitive Status: Within Functional Limits for tasks assessed                                        General Comments      Exercises     Assessment/Plan    PT Assessment Patient needs continued PT services  PT Problem List Decreased strength;Decreased range of motion;Decreased activity tolerance;Decreased balance;Decreased mobility;Decreased knowledge of use of DME;Decreased knowledge of precautions;Pain       PT Treatment Interventions DME instruction;Gait training;Stair training;Functional mobility training;Therapeutic activities;Therapeutic exercise;Balance training;Patient/family education    PT Goals (Current goals can be found in the Care Plan section)  Acute Rehab PT Goals Patient Stated Goal: stop hurting and be independent again PT Goal Formulation: With patient Time For Goal Achievement: 09/18/20 Potential to Achieve Goals: Good    Frequency 7X/week   Barriers to discharge        Co-evaluation               AM-PAC PT "6 Clicks" Mobility  Outcome Measure Help needed turning from your back to your side while in a flat bed without using bedrails?: A Little Help needed moving from lying on your back to sitting on the side of a flat bed without using bedrails?: A Little Help needed moving to and from a bed to a chair (including a wheelchair)?: A Little Help needed standing up from a chair using your arms (e.g., wheelchair or bedside chair)?: A Little Help needed to walk in hospital room?: A Little Help needed climbing 3-5 steps with a railing? : A Little 6 Click Score: 18    End of Session Equipment Utilized During  Treatment: Gait belt Activity Tolerance: Patient tolerated treatment well Patient left: in bed;with call bell/phone within reach Nurse Communication: Mobility status PT Visit Diagnosis: Muscle weakness (generalized) (M62.81);Difficulty in walking, not elsewhere classified (R26.2);Pain Pain - Right/Left: Left Pain - part of body: Hip    Time: 2919-1660 PT Time Calculation (min) (ACUTE ONLY): 27 min   Charges:   PT Evaluation $PT Eval Low Complexity: 1 Low PT Treatments $Gait Training: 8-22 mins        Verner Mould, DPT Acute Rehabilitation Services  Office [phone] Pager [phone]  09/11/2020 2:26 PM

## 2020-09-11 NOTE — Progress Notes (Signed)
Orthopedic Tech Progress Note Patient Details:  Crystal [name] 11-Aug-1951 [id number]  Ortho Devices Ortho Device/Splint Location: Alm Bustard Ortho Device/Splint Interventions: Application   Post Interventions Patient Tolerated: Well Instructions Provided: Care of device   [place] Pink 09/11/2020, 4:10 PM

## 2020-09-11 NOTE — Progress Notes (Signed)
Physical Therapy Treatment Patient Details Name: Crystal [name] MRN: [id number] DOB: 1951/01/27 Today's Date: 09/11/2020    History of Present Illness Patient is 69 y.o. female s/p Lt THA anterior approach on 09/11/20 with PMH for osteoporosis, HTN, GERD, depression, COPD, anxiety.    PT Comments    Patient seen for additional therapy session in attempt to progress mobility for safe discharge home. Patient demonstrated good carryover for safety with transfers and gait with RW however, she remains limited by pain in Lt hip and some dizziness. BP stable this session during transfers and gait however pt provided seated break due to onset of nausea. She will benefit from follow up PT interventions to address impairments and progress towards PLOF. Acute PT will follow to progress mobility and stair training in preparation for safe discharge home.    Follow Up Recommendations  Follow surgeon's recommendation for DC plan and follow-up therapies     Equipment Recommendations  None recommended by PT    Recommendations for Other Services       Precautions / Restrictions Precautions Precautions: Fall Restrictions Weight Bearing Restrictions: No Other Position/Activity Restrictions: WBAT    Mobility  Bed Mobility Overal bed mobility: Needs Assistance Bed Mobility: Supine to Sit;Sit to Supine     Supine to sit: Min guard Sit to supine: Min assist   General bed mobility comments: guard for safety and pt taking extra time to sit up EOB. Light assist to bring Lt LE onto bed at EOS.  Transfers Overall transfer level: Needs assistance Equipment used: Rolling walker (2 wheeled) Transfers: Sit to/from Stand Sit to Stand: Min guard         General transfer comment: pt with good carryover for safe power up, min guard for safety to rise from EOB. pt c/o of nausea after gait and seated rest provided, BP stable. Stand pivot with use of bed rail for support at EOB to move from recliner to  hospital bed.   Ambulation/Gait Ambulation/Gait assistance: Min guard Gait Distance (Feet): 45 Feet Assistive device: Rolling walker (2 wheeled) Gait Pattern/deviations: Step-to pattern;Decreased stride length;Decreased weight shift to left;Antalgic Gait velocity: decr   General Gait Details: pt with good carryover for safe use of RW and maintained safe proximity. guarding for safety due to pt's prior dizziness and Lt hip pain.    Stairs             Wheelchair Mobility    Modified Rankin (Stroke Patients Only)       Balance Overall balance assessment: Needs assistance Sitting-balance support: Feet supported Sitting balance-Leahy Scale: Good     Standing balance support: During functional activity;Bilateral upper extremity supported Standing balance-Leahy Scale: Fair                              Cognition Arousal/Alertness: Awake/alert Behavior During Therapy: WFL for tasks assessed/performed Overall Cognitive Status: Within Functional Limits for tasks assessed                                        Exercises      General Comments        Pertinent Vitals/Pain Pain Assessment: 0-10 Pain Score: 6  Pain Location: Lt hip Pain Descriptors / Indicators: Aching;Burning Pain Intervention(s): Limited activity within patient's tolerance;Premedicated before session;Monitored during session;Repositioned;Ice applied    Home Living Family/patient expects to  be discharged to:: Private residence Living Arrangements: Alone Available Help at Discharge: Family Type of Home: House Home Access: Stairs to enter Entrance Stairs-Rails: None Home Layout: One level Home Equipment: [place] - single point;Walker - 2 wheels;Toilet riser;Grab bars - tub/shower      Prior Function Level of Independence: Independent with assistive device(s)      Comments: using SPC since july due to pain   PT Goals (current goals can now be found in the care plan  section) Acute Rehab PT Goals Patient Stated Goal: stop hurting and be independent again PT Goal Formulation: With patient Time For Goal Achievement: 09/18/20 Potential to Achieve Goals: Good Progress towards PT goals: Progressing toward goals    Frequency    7X/week      PT Plan Current plan remains appropriate    Co-evaluation              AM-PAC PT "6 Clicks" Mobility   Outcome Measure  Help needed turning from your back to your side while in a flat bed without using bedrails?: A Little Help needed moving from lying on your back to sitting on the side of a flat bed without using bedrails?: A Little Help needed moving to and from a bed to a chair (including a wheelchair)?: A Little Help needed standing up from a chair using your arms (e.g., wheelchair or bedside chair)?: A Little Help needed to walk in hospital room?: A Little Help needed climbing 3-5 steps with a railing? : A Little 6 Click Score: 18    End of Session Equipment Utilized During Treatment: Gait belt Activity Tolerance: Patient tolerated treatment well Patient left: in bed;with call bell/phone within reach Nurse Communication: Mobility status PT Visit Diagnosis: Muscle weakness (generalized) (M62.81);Difficulty in walking, not elsewhere classified (R26.2);Pain Pain - Right/Left: Left Pain - part of body: Hip     Time: 0240-9735 PT Time Calculation (min) (ACUTE ONLY): 28 min  Charges:  $Gait Training: 8-22 mins $Therapeutic Activity: 8-22 mins                     Verner Mould, DPT Acute Rehabilitation Services  Office [phone] Pager [phone]  09/11/2020 2:37 PM

## 2020-09-11 NOTE — Interval H&P Note (Signed)
History and Physical Interval Note:  09/11/2020 7:24 AM  Crystal [name]  has presented today for surgery, with the diagnosis of LEFT HIP DEGENERATIVE JOINT DISEASE.  The various methods of treatment have been discussed with the patient and family. After consideration of risks, benefits and other options for treatment, the patient has consented to  Procedure(s): LEFT TOTAL HIP ARTHROPLASTY ANTERIOR APPROACH (Left) as a surgical intervention.  The patient's history has been reviewed, patient examined, no change in status, stable for surgery.  I have reviewed the patient's chart and labs.  Questions were answered to the patient's satisfaction.     Hessie Dibble

## 2020-09-12 ENCOUNTER — Encounter (HOSPITAL_COMMUNITY): Payer: Self-pay | Admitting: Orthopaedic Surgery

## 2020-09-12 DIAGNOSIS — F17210 Nicotine dependence, cigarettes, uncomplicated: Secondary | ICD-10-CM | POA: Diagnosis not present

## 2020-09-12 DIAGNOSIS — J449 Chronic obstructive pulmonary disease, unspecified: Secondary | ICD-10-CM | POA: Diagnosis not present

## 2020-09-12 DIAGNOSIS — Z7982 Long term (current) use of aspirin: Secondary | ICD-10-CM | POA: Diagnosis not present

## 2020-09-12 DIAGNOSIS — Z86010 Personal history of colonic polyps: Secondary | ICD-10-CM | POA: Diagnosis not present

## 2020-09-12 DIAGNOSIS — I10 Essential (primary) hypertension: Secondary | ICD-10-CM | POA: Diagnosis not present

## 2020-09-12 DIAGNOSIS — J45909 Unspecified asthma, uncomplicated: Secondary | ICD-10-CM | POA: Diagnosis not present

## 2020-09-12 DIAGNOSIS — Z79899 Other long term (current) drug therapy: Secondary | ICD-10-CM | POA: Diagnosis not present

## 2020-09-12 DIAGNOSIS — M1612 Unilateral primary osteoarthritis, left hip: Secondary | ICD-10-CM | POA: Diagnosis not present

## 2020-09-12 NOTE — Discharge Summary (Signed)
Patient ID: Crystal [name] MRN: [id number] DOB/AGE: Dec 24, 1950 69 y.o.  Admit date: 09/11/2020 Discharge date: 09/12/2020  Admission Diagnoses:  Principal Problem:   Primary localized osteoarthritis of left hip Active Problems:   Primary osteoarthritis of left hip   Discharge Diagnoses:  Same  Past Medical History:  Diagnosis Date  . ALLERGIC RHINITIS   . Allergy   . ANXIETY    no medations needed  . Arthritis   . Asthma   . Bronchitis   . Cataract    no surgery  . COLONIC POLYPS, HX OF   . Complication of anesthesia    oxygen sat dropped during endoscopy   . COPD    pt unsure of this dx  . Cough   . DEPRESSION   . Fatty liver   . GERD   . GLUCOSE INTOLERANCE   . HYPERTENSION   . IBS   . Impaired glucose tolerance   . Obstructive sleep apnea 06/25/2018  . OSTEOPENIA   . Osteopenia   . Osteoporosis 07/30/2017  . PERIPHERAL EDEMA   . Pneumonia   . Pneumonia, organism unspecified(486)   . Sleep apnea    Oral Aparatus  . TOBACCO USE DISORDER/SMOKER-SMOKING CESSATION DISCUSSED     Surgeries: Procedure(s): LEFT TOTAL HIP ARTHROPLASTY ANTERIOR APPROACH on 09/11/2020   Consultants:   Discharged Condition: Improved  Hospital Course: ROLLA SERVIDIO is an 69 y.o. female who was admitted 09/11/2020 for operative treatment ofPrimary localized osteoarthritis of left hip. Patient has severe unremitting pain that affects sleep, daily activities, and work/hobbies. After pre-op clearance the patient was taken to the operating room on 09/11/2020 and underwent  Procedure(s): LEFT TOTAL HIP ARTHROPLASTY ANTERIOR APPROACH.    Patient was given perioperative antibiotics:  Anti-infectives (From admission, onward)   Start     Dose/Rate Route Frequency Ordered Stop   09/11/20 1330  ceFAZolin (ANCEF) IVPB 2g/100 mL premix        2 g 200 mL/hr over 30 Minutes Intravenous Every 6 hours 09/11/20 0927 09/11/20 2049   09/11/20 0810  ceFAZolin (ANCEF) 2-4 GM/100ML-% IVPB        Note to Pharmacy: Sandrea Matte   : cabinet override      09/11/20 0810 09/11/20 2014   09/11/20 0600  vancomycin (VANCOCIN) IVPB 1000 mg/200 mL premix        1,000 mg 200 mL/hr over 60 Minutes Intravenous On call to O.R. 09/11/20 0531 09/11/20 [id number]       Patient was given sequential compression devices, early ambulation, and chemoprophylaxis to prevent DVT.  Patient benefited maximally from hospital stay and there were no complications.    Recent vital signs:  Patient Vitals for the past 24 hrs:  BP Temp Temp src Pulse Resp SpO2 Height Weight  09/12/20 0603 120/61 99 F (37.2 C) Oral 78 17 97 % -- --  09/12/20 0141 (!) 128/58 97.8 F (36.6 C) Oral 83 17 96 % -- --  09/11/20 2203 140/69 99.5 F (37.5 C) Oral 82 17 98 % -- --  09/11/20 1746 (!) 147/73 98.7 F (37.1 C) Oral 82 18 98 % -- --  09/11/20 1500 -- -- -- -- -- -- 5' 2.99" (1.6 m) 88.1 kg  09/11/20 1400 (!) 167/95 -- -- 84 -- 100 % -- --  09/11/20 1300 123/84 -- -- 74 -- 90 % -- --  09/11/20 1100 116/67 -- -- 80 -- 98 % -- --  09/11/20 1030 115/70 -- -- 79 18 97 % -- --  09/11/20 1015 131/72 -- -- 78 18 95 % -- --  09/11/20 1000 121/70 -- -- 73 20 98 % -- --  09/11/20 0945 119/65 -- -- 80 17 99 % -- --  09/11/20 0930 100/61 -- -- 78 18 99 % -- --  09/11/20 0915 103/63 97.9 F (36.6 C) -- 80 16 100 % -- --     Recent laboratory studies: No results for input(s): WBC, HGB, HCT, PLT, NA, K, CL, CO2, BUN, CREATININE, GLUCOSE, INR, CALCIUM in the last 72 hours.  Invalid input(s): PT, 2   Discharge Medications:   Allergies as of 09/12/2020      Reactions   Ace Inhibitors Cough   Penicillins Rash      Medication List    STOP taking these medications   aspirin 325 MG tablet Replaced by: aspirin EC 81 MG tablet   ibuprofen 800 MG tablet Commonly known as: ADVIL     TAKE these medications   albuterol 108 (90 Base) MCG/ACT inhaler Commonly known as: VENTOLIN HFA Inhale 1-2 puffs into the lungs every 4 (four)  hours as needed.   aspirin EC 81 MG tablet Take 1 tablet (81 mg total) by mouth 2 (two) times daily after a meal. Swallow whole. Replaces: aspirin 325 MG tablet   Calcium 600+D 600-800 MG-UNIT Tabs Generic drug: Calcium Carb-Cholecalciferol Take 1 tablet by mouth in the morning and at bedtime.   denosumab 60 MG/ML Sosy injection Commonly known as: PROLIA Inject 60 mg into the skin every 6 (six) months.   fluticasone 50 MCG/ACT nasal spray Commonly known as: FLONASE Place 2 sprays into both nostrils daily as needed for allergies or rhinitis.   folic acid 035 MCG tablet Commonly known as: FOLVITE Take 400 mcg by mouth daily.   HYDROcodone-acetaminophen 5-325 MG tablet Commonly known as: NORCO/VICODIN Take 1-2 tablets by mouth every 6 (six) hours as needed for moderate pain.   losartan 50 MG tablet Commonly known as: COZAAR Take 1 tablet (50 mg total) by mouth daily.   metoprolol succinate 25 MG 24 hr tablet Commonly known as: TOPROL-XL Take 1 tablet (25 mg total) by mouth daily.   MUCINEX ALLERGY PO Take 600 mg by mouth as needed.   tiZANidine 4 MG tablet Commonly known as: Zanaflex Take 1 tablet (4 mg total) by mouth every 6 (six) hours as needed for muscle spasms.   vitamin B-12 1000 MCG tablet Commonly known as: CYANOCOBALAMIN Take 1 tablet (1,000 mcg total) by mouth daily.   VITAMIN D PO Take 2,000 Units by mouth daily.            Durable Medical Equipment  (From admission, onward)         Start     Ordered   09/11/20 1040  DME Walker rolling  Once       Question:  Patient needs a walker to treat with the following condition  Answer:  Primary osteoarthritis of left hip   09/11/20 1039   09/11/20 1040  DME 3 n 1  Once        09/11/20 1039   09/11/20 1040  DME Bedside commode  Once       Question:  Patient needs a bedside commode to treat with the following condition  Answer:  Primary osteoarthritis of left hip   09/11/20 1039           Diagnostic Studies: DG Chest 2 View  Result Date: 08/31/2020 CLINICAL DATA:  Preoperative examination. EXAM: CHEST -  2 VIEW COMPARISON:  PA and lateral chest 06/16/2008. FINDINGS: The lungs are clear. Heart size is normal. No pneumothorax or pleural fluid. No acute or focal bony abnormality. Thoracic spondylosis noted. The patient is status post cholecystectomy. IMPRESSION: No acute disease. Electronically Signed   By: Inge Rise M.D.   On: 08/31/2020 15:26   DG C-Arm 1-60 Min-No Report  Result Date: 09/11/2020 Fluoroscopy was utilized by the requesting physician.  No radiographic interpretation.   DG HIP OPERATIVE UNILAT W OR W/O PELVIS LEFT  Result Date: 09/11/2020 CLINICAL DATA:  Left hip replacement. EXAM: OPERATIVE LEFT HIP (WITH PELVIS IF PERFORMED) 2 VIEWS TECHNIQUE: Fluoroscopic spot image(s) were submitted for interpretation post-operatively. COMPARISON:  02/27/2020. FINDINGS: Total left hip replacement. Hardware intact. Anatomic alignment. No acute bony abnormality identified. IMPRESSION: Total left hip replacement with anatomic alignment. Electronically Signed   By: Marcello Moores  Register   On: 09/11/2020 10:33    Disposition: Discharge disposition: 01-Home or Self Care       Discharge Instructions    Call MD / Call 911   Complete by: As directed    If you experience chest pain or shortness of breath, CALL 911 and be transported to the hospital emergency room.  If you develope a fever above 101 F, pus (white drainage) or increased drainage or redness at the wound, or calf pain, call your surgeon's office.   Call MD / Call 911   Complete by: As directed    If you experience chest pain or shortness of breath, CALL 911 and be transported to the hospital emergency room.  If you develope a fever above 101 F, pus (white drainage) or increased drainage or redness at the wound, or calf pain, call your surgeon's office.   Constipation Prevention   Complete by: As directed     Drink plenty of fluids.  Prune juice may be helpful.  You may use a stool softener, such as Colace (over the counter) 100 mg twice a day.  Use MiraLax (over the counter) for constipation as needed.   Constipation Prevention   Complete by: As directed    Drink plenty of fluids.  Prune juice may be helpful.  You may use a stool softener, such as Colace (over the counter) 100 mg twice a day.  Use MiraLax (over the counter) for constipation as needed.   Diet - low sodium heart healthy   Complete by: As directed    Diet - low sodium heart healthy   Complete by: As directed    Discharge instructions   Complete by: As directed    INSTRUCTIONS AFTER JOINT REPLACEMENT   Remove items at home which could result in a fall. This includes throw rugs or furniture in walking pathways ICE to the affected joint every three hours while awake for 30 minutes at a time, for at least the first 3-5 days, and then as needed for pain and swelling.  Continue to use ice for pain and swelling. You may notice swelling that will progress down to the foot and ankle.  This is normal after surgery.  Elevate your leg when you are not up walking on it.   Continue to use the breathing machine you got in the hospital (incentive spirometer) which will help keep your temperature down.  It is common for your temperature to cycle up and down following surgery, especially at night when you are not up moving around and exerting yourself.  The breathing machine keeps your lungs expanded and your  temperature down.   DIET:  As you were doing prior to hospitalization, we recommend a well-balanced diet.  DRESSING / WOUND CARE / SHOWERING  You may shower 3 days after surgery, but keep the wounds dry during showering.  You may use an occlusive plastic wrap (Press'n Seal for example), NO SOAKING/SUBMERGING IN THE BATHTUB.  If the bandage gets wet, change with a clean dry gauze.  If the incision gets wet, pat the wound dry with a clean  towel.  ACTIVITY  Increase activity slowly as tolerated, but follow the weight bearing instructions below.   No driving for 6 weeks or until further direction given by your physician.  You cannot drive while taking narcotics.  No lifting or carrying greater than 10 lbs. until further directed by your surgeon. Avoid periods of inactivity such as sitting longer than an hour when not asleep. This helps prevent blood clots.  You may return to work once you are authorized by your doctor.     WEIGHT BEARING   Weight bearing as tolerated with assist device (walker, cane, etc) as directed, use it as long as suggested by your surgeon or therapist, typically at least 4-6 weeks.   EXERCISES  Results after joint replacement surgery are often greatly improved when you follow the exercise, range of motion and muscle strengthening exercises prescribed by your doctor. Safety measures are also important to protect the joint from further injury. Any time any of these exercises cause you to have increased pain or swelling, decrease what you are doing until you are comfortable again and then slowly increase them. If you have problems or questions, call your caregiver or physical therapist for advice.   Rehabilitation is important following a joint replacement. After just a few days of immobilization, the muscles of the leg can become weakened and shrink (atrophy).  These exercises are designed to build up the tone and strength of the thigh and leg muscles and to improve motion. Often times heat used for twenty to thirty minutes before working out will loosen up your tissues and help with improving the range of motion but do not use heat for the first two weeks following surgery (sometimes heat can increase post-operative swelling).   These exercises can be done on a training (exercise) mat, on the floor, on a table or on a bed. Use whatever works the best and is most comfortable for you.    Use music or  television while you are exercising so that the exercises are a pleasant break in your day. This will make your life better with the exercises acting as a break in your routine that you can look forward to.   Perform all exercises about fifteen times, three times per day or as directed.  You should exercise both the operative leg and the other leg as well.  Exercises include:   Quad Sets - Tighten up the muscle on the front of the thigh (Quad) and hold for 5-10 seconds.   Straight Leg Raises - With your knee straight (if you were given a brace, keep it on), lift the leg to 60 degrees, hold for 3 seconds, and slowly lower the leg.  Perform this exercise against resistance later as your leg gets stronger.  Leg Slides: Lying on your back, slowly slide your foot toward your buttocks, bending your knee up off the floor (only go as far as is comfortable). Then slowly slide your foot back down until your leg is flat on the floor  again.  [place] Haring Wings: Lying on your back spread your legs to the side as far apart as you can without causing discomfort.  Hamstring Strength:  Lying on your back, push your heel against the floor with your leg straight by tightening up the muscles of your buttocks.  Repeat, but this time bend your knee to a comfortable angle, and push your heel against the floor.  You may put a pillow under the heel to make it more comfortable if necessary.   A rehabilitation program following joint replacement surgery can speed recovery and prevent re-injury in the future due to weakened muscles. Contact your doctor or a physical therapist for more information on knee rehabilitation.    CONSTIPATION  Constipation is defined medically as fewer than three stools per week and severe constipation as less than one stool per week.  Even if you have a regular bowel pattern at home, your normal regimen is likely to be disrupted due to multiple reasons following surgery.  Combination of anesthesia,  postoperative narcotics, change in appetite and fluid intake all can affect your bowels.   YOU MUST use at least one of the following options; they are listed in order of increasing strength to get the job done.  They are all available over the counter, and you may need to use some, POSSIBLY even all of these options:    Drink plenty of fluids (prune juice may be helpful) and high fiber foods Colace 100 mg by mouth twice a day  Senokot for constipation as directed and as needed Dulcolax (bisacodyl), take with full glass of water  Miralax (polyethylene glycol) once or twice a day as needed.  If you have tried all these things and are unable to have a bowel movement in the first 3-4 days after surgery call either your surgeon or your primary doctor.    If you experience loose stools or diarrhea, hold the medications until you stool forms back up.  If your symptoms do not get better within 1 week or if they get worse, check with your doctor.  If you experience "the worst abdominal pain ever" or develop nausea or vomiting, please contact the office immediately for further recommendations for treatment.   ITCHING:  If you experience itching with your medications, try taking only a single pain pill, or even half a pain pill at a time.  You can also use Benadryl over the counter for itching or also to help with sleep.   TED HOSE STOCKINGS:  Use stockings on both legs until for at least 2 weeks or as directed by physician office. They may be removed at night for sleeping.  MEDICATIONS:  See your medication summary on the "After Visit Summary" that nursing will review with you.  You may have some home medications which will be placed on hold until you complete the course of blood thinner medication.  It is important for you to complete the blood thinner medication as prescribed.  PRECAUTIONS:  If you experience chest pain or shortness of breath - call 911 immediately for transfer to the hospital emergency  department.   If you develop a fever greater that 101 F, purulent drainage from wound, increased redness or drainage from wound, foul odor from the wound/dressing, or calf pain - CONTACT YOUR SURGEON.  FOLLOW-UP APPOINTMENTS:  If you do not already have a post-op appointment, please call the office for an appointment to be seen by your surgeon.  Guidelines for how soon to be seen are listed in your "After Visit Summary", but are typically between 1-4 weeks after surgery.  OTHER INSTRUCTIONS:   Knee Replacement:  Do not place pillow under knee, focus on keeping the knee straight while resting. CPM instructions: 0-90 degrees, 2 hours in the morning, 2 hours in the afternoon, and 2 hours in the evening. Place foam block, curve side up under heel at all times except when in CPM or when walking.  DO NOT modify, tear, cut, or change the foam block in any way.   DENTAL ANTIBIOTICS:  In most cases prophylactic antibiotics for Dental procdeures after total joint surgery are not necessary.  Exceptions are as follows:  1. History of prior total joint infection  2. Severely immunocompromised (Organ Transplant, cancer chemotherapy, Rheumatoid biologic meds such as [place])  3. Poorly controlled diabetes (A1C &gt; 8.0, blood glucose over 200)  If you have one of these conditions, contact your surgeon for an antibiotic prescription, prior to your dental procedure.   MAKE SURE YOU:  Understand these instructions.  Get help right away if you are not doing well or get worse.    Thank you for letting us be a part of your medical care team.  It is a privilege we respect greatly.  We hope these instructions will help you stay on track for a fast and full recovery!   Discharge instructions   Complete by: As directed    INSTRUCTIONS AFTER JOINT REPLACEMENT   Remove items at home which could result in a fall. This includes throw rugs or furniture in  walking pathways ICE to the affected joint every three hours while awake for 30 minutes at a time, for at least the first 3-5 days, and then as needed for pain and swelling.  Continue to use ice for pain and swelling. You may notice swelling that will progress down to the foot and ankle.  This is normal after surgery.  Elevate your leg when you are not up walking on it.   Continue to use the breathing machine you got in the hospital (incentive spirometer) which will help keep your temperature down.  It is common for your temperature to cycle up and down following surgery, especially at night when you are not up moving around and exerting yourself.  The breathing machine keeps your lungs expanded and your temperature down.   DIET:  As you were doing prior to hospitalization, we recommend a well-balanced diet.  DRESSING / WOUND CARE / SHOWERING  You may shower 3 days after surgery, but keep the wounds dry during showering.  You may use an occlusive plastic wrap (Press'n Seal for example), NO SOAKING/SUBMERGING IN THE BATHTUB.  If the bandage gets wet, change with a clean dry gauze.  If the incision gets wet, pat the wound dry with a clean towel.  ACTIVITY  Increase activity slowly as tolerated, but follow the weight bearing instructions below.   No driving for 6 weeks or until further direction given by your physician.  You cannot drive while taking narcotics.  No lifting or carrying greater than 10 lbs. until further directed by your surgeon. Avoid periods of inactivity such as sitting longer than an hour when not asleep. This helps prevent blood clots.  You may return to work once you are authorized by your  doctor.     WEIGHT BEARING   Weight bearing as tolerated with assist device (walker, cane, etc) as directed, use it as long as suggested by your surgeon or therapist, typically at least 4-6 weeks.   EXERCISES  Results after joint replacement surgery are often greatly improved when you  follow the exercise, range of motion and muscle strengthening exercises prescribed by your doctor. Safety measures are also important to protect the joint from further injury. Any time any of these exercises cause you to have increased pain or swelling, decrease what you are doing until you are comfortable again and then slowly increase them. If you have problems or questions, call your caregiver or physical therapist for advice.   Rehabilitation is important following a joint replacement. After just a few days of immobilization, the muscles of the leg can become weakened and shrink (atrophy).  These exercises are designed to build up the tone and strength of the thigh and leg muscles and to improve motion. Often times heat used for twenty to thirty minutes before working out will loosen up your tissues and help with improving the range of motion but do not use heat for the first two weeks following surgery (sometimes heat can increase post-operative swelling).   These exercises can be done on a training (exercise) mat, on the floor, on a table or on a bed. Use whatever works the best and is most comfortable for you.    Use music or television while you are exercising so that the exercises are a pleasant break in your day. This will make your life better with the exercises acting as a break in your routine that you can look forward to.   Perform all exercises about fifteen times, three times per day or as directed.  You should exercise both the operative leg and the other leg as well.  Exercises include:   Quad Sets - Tighten up the muscle on the front of the thigh (Quad) and hold for 5-10 seconds.   Straight Leg Raises - With your knee straight (if you were given a brace, keep it on), lift the leg to 60 degrees, hold for 3 seconds, and slowly lower the leg.  Perform this exercise against resistance later as your leg gets stronger.  Leg Slides: Lying on your back, slowly slide your foot toward your  buttocks, bending your knee up off the floor (only go as far as is comfortable). Then slowly slide your foot back down until your leg is flat on the floor again.  Angel Wings: Lying on your back spread your legs to the side as far apart as you can without causing discomfort.  Hamstring Strength:  Lying on your back, push your heel against the floor with your leg straight by tightening up the muscles of your buttocks.  Repeat, but this time bend your knee to a comfortable angle, and push your heel against the floor.  You may put a pillow under the heel to make it more comfortable if necessary.   A rehabilitation program following joint replacement surgery can speed recovery and prevent re-injury in the future due to weakened muscles. Contact your doctor or a physical therapist for more information on knee rehabilitation.    CONSTIPATION  Constipation is defined medically as fewer than three stools per week and severe constipation as less than one stool per week.  Even if you have a regular bowel pattern at home, your normal regimen is likely to be disrupted due to  multiple reasons following surgery.  Combination of anesthesia, postoperative narcotics, change in appetite and fluid intake all can affect your bowels.   YOU MUST use at least one of the following options; they are listed in order of increasing strength to get the job done.  They are all available over the counter, and you may need to use some, POSSIBLY even all of these options:    Drink plenty of fluids (prune juice may be helpful) and high fiber foods Colace 100 mg by mouth twice a day  Senokot for constipation as directed and as needed Dulcolax (bisacodyl), take with full glass of water  Miralax (polyethylene glycol) once or twice a day as needed.  If you have tried all these things and are unable to have a bowel movement in the first 3-4 days after surgery call either your surgeon or your primary doctor.    If you experience loose  stools or diarrhea, hold the medications until you stool forms back up.  If your symptoms do not get better within 1 week or if they get worse, check with your doctor.  If you experience "the worst abdominal pain ever" or develop nausea or vomiting, please contact the office immediately for further recommendations for treatment.   ITCHING:  If you experience itching with your medications, try taking only a single pain pill, or even half a pain pill at a time.  You can also use Benadryl over the counter for itching or also to help with sleep.   TED HOSE STOCKINGS:  Use stockings on both legs until for at least 2 weeks or as directed by physician office. They may be removed at night for sleeping.  MEDICATIONS:  See your medication summary on the "After Visit Summary" that nursing will review with you.  You may have some home medications which will be placed on hold until you complete the course of blood thinner medication.  It is important for you to complete the blood thinner medication as prescribed.  PRECAUTIONS:  If you experience chest pain or shortness of breath - call 911 immediately for transfer to the hospital emergency department.   If you develop a fever greater that 101 F, purulent drainage from wound, increased redness or drainage from wound, foul odor from the wound/dressing, or calf pain - CONTACT YOUR SURGEON.                                                   FOLLOW-UP APPOINTMENTS:  If you do not already have a post-op appointment, please call the office for an appointment to be seen by your surgeon.  Guidelines for how soon to be seen are listed in your "After Visit Summary", but are typically between 1-4 weeks after surgery.  OTHER INSTRUCTIONS:   Knee Replacement:  Do not place pillow under knee, focus on keeping the knee straight while resting. CPM instructions: 0-90 degrees, 2 hours in the morning, 2 hours in the afternoon, and 2 hours in the evening. Place foam block, curve side  up under heel at all times except when in CPM or when walking.  DO NOT modify, tear, cut, or change the foam block in any way.   DENTAL ANTIBIOTICS:  In most cases prophylactic antibiotics for Dental procdeures after total joint surgery are not necessary.  Exceptions are as follows:  1. History  of prior total joint infection  2. Severely immunocompromised (Organ Transplant, cancer chemotherapy, Rheumatoid biologic meds such as [place])  3. Poorly controlled diabetes (A1C &gt; 8.0, blood glucose over 200)  If you have one of these conditions, contact your surgeon for an antibiotic prescription, prior to your dental procedure.   MAKE SURE YOU:  Understand these instructions.  Get help right away if you are not doing well or get worse.    Thank you for letting us be a part of your medical care team.  It is a privilege we respect greatly.  We hope these instructions will help you stay on track for a fast and full recovery!   Increase activity slowly as tolerated   Complete by: As directed    Increase activity slowly as tolerated   Complete by: As directed        Follow-up Information    Melrose Nakayama, MD. Go on 09/21/2020.   Specialty: Orthopedic Surgery Why: Your appointment has been scheduled for 9:00  Contact information: [place] [address] [phone]        [facility] Follow up.   Specialty: [facility] Why: You will have 5 HHPT visits prior to starting outpatient physical therapy  Contact information: [address] [place] [address] [phone]        [facility], [place]. Go on 09/21/2020.   Why: You will start outpatient physical therapy at 10:00. Please go across the lobby after MD appointment to complete your paperwork  Contact information: Physical Therapy [place] [address] [phone]                Signed: Larwance Sachs Denzil Mceachron 09/12/2020, 8:05 AM

## 2020-09-12 NOTE — Progress Notes (Signed)
Subjective: 1 Day Post-Op Procedure(s) (LRB): LEFT TOTAL HIP ARTHROPLASTY ANTERIOR APPROACH (Left)   Patient feeling much better this morning. She is hoping to go home today.  Activity level:  wbat Diet tolerance:  ok Voiding:  ok Patient reports pain as mild.    Objective: Vital signs in last 24 hours: Temp:  [97.8 F (36.6 C)-99.5 F (37.5 C)] 99 F (37.2 C) (11/24 0603) Pulse Rate:  [73-84] 78 (11/24 0603) Resp:  [16-20] 17 (11/24 0603) BP: (100-167)/(58-95) 120/61 (11/24 0603) SpO2:  [90 %-100 %] 97 % (11/24 0603) Weight:  [88.1 kg] 88.1 kg (11/23 1500)  Labs: No results for input(s): HGB in the last 72 hours. No results for input(s): WBC, RBC, HCT, PLT in the last 72 hours. No results for input(s): NA, K, CL, CO2, BUN, CREATININE, GLUCOSE, CALCIUM in the last 72 hours. No results for input(s): LABPT, INR in the last 72 hours.  Physical Exam:  Neurologically intact ABD soft Neurovascular intact Sensation intact distally Intact pulses distally Dorsiflexion/Plantar flexion intact Incision: dressing C/D/I No cellulitis present Compartment soft  Assessment/Plan:  1 Day Post-Op Procedure(s) (LRB): LEFT TOTAL HIP ARTHROPLASTY ANTERIOR APPROACH (Left) Advance diet Up with therapy D/C IV fluids Discharge home with home health today after PT if cleared and doing well. Follow up in office 2 weeks post op. Continue on 61m asa BID x 4 weeks for DVT prevention.   ALarwance SachsNida 09/12/2020, 8:03 AM

## 2020-09-12 NOTE — Plan of Care (Signed)

## 2020-09-12 NOTE — Progress Notes (Signed)
Physical Therapy Treatment Patient Details Name: Crystal [name] MRN: [id number] DOB: 1951/04/20 Today's Date: 09/12/2020    History of Present Illness Patient is 69 y.o. female s/p Lt THA anterior approach on 09/11/20 with PMH for osteoporosis, HTN, GERD, depression, COPD, anxiety.    PT Comments    Progressing well this pm. Pt ready for d/c home with family assist    Follow Up Recommendations  Follow surgeon's recommendation for DC plan and follow-up therapies     Equipment Recommendations  None recommended by PT    Recommendations for Other Services       Precautions / Restrictions Precautions Precautions: Fall Restrictions Weight Bearing Restrictions: No Other Position/Activity Restrictions: WBAT    Mobility  Bed Mobility Overal bed mobility: Needs Assistance Bed Mobility: Supine to Sit     Supine to sit: Min guard     General bed mobility comments: in recliner   Transfers Overall transfer level: Needs assistance Equipment used: Rolling walker (2 wheeled) Transfers: Sit to/from Stand Sit to Stand: Supervision;Modified independent (Device/Increase time)         General transfer comment: cues for hand placement and L LE position   Ambulation/Gait Ambulation/Gait assistance: Min guard;Supervision Gait Distance (Feet): 40 Feet (15' x2 ) Assistive device: Rolling walker (2 wheeled) Gait Pattern/deviations: Step-to pattern;Decreased step length - right;Decreased step length - left;Decreased weight shift to left     General Gait Details: good carry over, self cues for sequence and RW position    Stairs Stairs: Yes   Stair Management: No rails;Step to pattern;With walker;Backwards;Forwards Number of Stairs: 3 (3 2 and 1)) General stair comments: up/d own pne step forwards and 2 backwards   Wheelchair Mobility    Modified Rankin (Stroke Patients Only)       Balance                                            Cognition  Arousal/Alertness: Awake/alert Behavior During Therapy: WFL for tasks assessed/performed Overall Cognitive Status: Within Functional Limits for tasks assessed                                        Exercises Total Joint Exercises Ankle Circles/Pumps: AROM;Both;10 reps Quad Sets: AROM;Both;5 reps Heel Slides: AROM;AAROM;Left;10 reps Hip ABduction/ADduction: AAROM;Left;10 reps    General Comments        Pertinent Vitals/Pain Pain Assessment: 0-10 Pain Score: 5  Pain Location: Lt hip Pain Descriptors / Indicators: Aching;Burning Pain Intervention(s): Limited activity within patient's tolerance;Monitored during session;Premedicated before session;Repositioned;Ice applied    Home Living                      Prior Function            PT Goals (current goals can now be found in the care plan section) Acute Rehab PT Goals Patient Stated Goal: stop hurting and be independent again PT Goal Formulation: With patient Time For Goal Achievement: 09/18/20 Potential to Achieve Goals: Good Progress towards PT goals: Progressing toward goals    Frequency    7X/week      PT Plan Current plan remains appropriate    Co-evaluation              AM-PAC PT "6 Clicks" Mobility  Outcome Measure  Help needed turning from your back to your side while in a flat bed without using bedrails?: A Little Help needed moving from lying on your back to sitting on the side of a flat bed without using bedrails?: A Little Help needed moving to and from a bed to a chair (including a wheelchair)?: A Little Help needed standing up from a chair using your arms (e.g., wheelchair or bedside chair)?: A Little Help needed to walk in hospital room?: A Little Help needed climbing 3-5 steps with a railing? : A Little 6 Click Score: 18    End of Session Equipment Utilized During Treatment: Gait belt Activity Tolerance: Patient tolerated treatment well Patient left: in  chair;with call bell/phone within reach;with chair alarm set Nurse Communication: Mobility status PT Visit Diagnosis: Muscle weakness (generalized) (M62.81);Difficulty in walking, not elsewhere classified (R26.2);Pain Pain - Right/Left: Left Pain - part of body: Hip     Time: 1420-1445 PT Time Calculation (min) (ACUTE ONLY): 25 min  Charges:  $Gait Training: 23-37 mins $Therapeutic Exercise: 8-22 mins                     Baxter Flattery, PT  Acute Rehab Dept ([place]) [phone] Pager [phone]  09/12/2020    [facility] 09/12/2020, 2:53 PM

## 2020-09-12 NOTE — Progress Notes (Signed)
Physical Therapy Treatment Patient Details Name: Crystal [name] MRN: [id number] DOB: 1951-05-10 Today's Date: 09/12/2020    History of Present Illness Patient is 69 y.o. female s/p Lt THA anterior approach on 09/11/20 with PMH for osteoporosis, HTN, GERD, depression, COPD, anxiety.    PT Comments    Pt progressing well today. Very mild dizziness on initial sitting which resolved within a few seconds. Will see again this pm and pt should be ready for d/c later today    Follow Up Recommendations  Follow surgeon's recommendation for DC plan and follow-up therapies     Equipment Recommendations  None recommended by PT    Recommendations for Other Services       Precautions / Restrictions Precautions Precautions: Fall Restrictions Weight Bearing Restrictions: No Other Position/Activity Restrictions: WBAT    Mobility  Bed Mobility Overal bed mobility: Needs Assistance Bed Mobility: Supine to Sit     Supine to sit: Min guard     General bed mobility comments: min/guard for safety   Transfers Overall transfer level: Needs assistance Equipment used: Rolling walker (2 wheeled) Transfers: Sit to/from Stand Sit to Stand: Min guard         General transfer comment: cues for hand placement and L LE position   Ambulation/Gait Ambulation/Gait assistance: Min guard;Supervision Gait Distance (Feet): 50 Feet Assistive device: Rolling walker (2 wheeled) Gait Pattern/deviations: Step-to pattern;Decreased step length - right;Decreased step length - left;Decreased weight shift to left     General Gait Details: cues for sequence and RW position from self, safety with turns    Marine scientist Rankin (Stroke Patients Only)       Balance                                            Cognition Arousal/Alertness: Awake/alert Behavior During Therapy: WFL for tasks assessed/performed Overall Cognitive Status:  Within Functional Limits for tasks assessed                                        Exercises Total Joint Exercises Ankle Circles/Pumps: AROM;Both;10 reps Quad Sets: AROM;Both;5 reps Heel Slides: AROM;AAROM;Left;10 reps Hip ABduction/ADduction: AAROM;Left;10 reps    General Comments        Pertinent Vitals/Pain Pain Assessment: 0-10 Pain Score: 4  Pain Location: Lt hip Pain Descriptors / Indicators: Aching;Burning Pain Intervention(s): Limited activity within patient's tolerance;Monitored during session;Premedicated before session;Repositioned    Home Living                      Prior Function            PT Goals (current goals can now be found in the care plan section) Acute Rehab PT Goals Patient Stated Goal: stop hurting and be independent again PT Goal Formulation: With patient Time For Goal Achievement: 09/18/20 Potential to Achieve Goals: Good Progress towards PT goals: Progressing toward goals    Frequency    7X/week      PT Plan Current plan remains appropriate    Co-evaluation              AM-PAC PT "6 Clicks" Mobility   Outcome Measure  Help needed turning  from your back to your side while in a flat bed without using bedrails?: A Little Help needed moving from lying on your back to sitting on the side of a flat bed without using bedrails?: A Little Help needed moving to and from a bed to a chair (including a wheelchair)?: A Little Help needed standing up from a chair using your arms (e.g., wheelchair or bedside chair)?: A Little Help needed to walk in hospital room?: A Little Help needed climbing 3-5 steps with a railing? : A Little 6 Click Score: 18    End of Session Equipment Utilized During Treatment: Gait belt Activity Tolerance: Patient tolerated treatment well Patient left: in chair;with call bell/phone within reach;with chair alarm set Nurse Communication: Mobility status PT Visit Diagnosis: Muscle weakness  (generalized) (M62.81);Difficulty in walking, not elsewhere classified (R26.2);Pain Pain - Right/Left: Left Pain - part of body: Hip     Time: 5462-7035 PT Time Calculation (min) (ACUTE ONLY): 31 min  Charges:  $Gait Training: 8-22 mins $Therapeutic Exercise: 8-22 mins                     Baxter Flattery, PT  Acute Rehab Dept ([place]) [phone] Pager [phone]  09/12/2020    [facility] 09/12/2020, 10:59 AM

## 2020-09-14 DIAGNOSIS — K219 Gastro-esophageal reflux disease without esophagitis: Secondary | ICD-10-CM | POA: Diagnosis not present

## 2020-09-14 DIAGNOSIS — Z471 Aftercare following joint replacement surgery: Secondary | ICD-10-CM | POA: Diagnosis not present

## 2020-09-14 DIAGNOSIS — M170 Bilateral primary osteoarthritis of knee: Secondary | ICD-10-CM | POA: Diagnosis not present

## 2020-09-14 DIAGNOSIS — J449 Chronic obstructive pulmonary disease, unspecified: Secondary | ICD-10-CM | POA: Diagnosis not present

## 2020-09-14 DIAGNOSIS — M810 Age-related osteoporosis without current pathological fracture: Secondary | ICD-10-CM | POA: Diagnosis not present

## 2020-09-14 DIAGNOSIS — F32A Depression, unspecified: Secondary | ICD-10-CM | POA: Diagnosis not present

## 2020-09-14 DIAGNOSIS — H269 Unspecified cataract: Secondary | ICD-10-CM | POA: Diagnosis not present

## 2020-09-14 DIAGNOSIS — G4733 Obstructive sleep apnea (adult) (pediatric): Secondary | ICD-10-CM | POA: Diagnosis not present

## 2020-09-14 DIAGNOSIS — I10 Essential (primary) hypertension: Secondary | ICD-10-CM | POA: Diagnosis not present

## 2020-09-15 DIAGNOSIS — M170 Bilateral primary osteoarthritis of knee: Secondary | ICD-10-CM | POA: Diagnosis not present

## 2020-09-15 DIAGNOSIS — G4733 Obstructive sleep apnea (adult) (pediatric): Secondary | ICD-10-CM | POA: Diagnosis not present

## 2020-09-15 DIAGNOSIS — K219 Gastro-esophageal reflux disease without esophagitis: Secondary | ICD-10-CM | POA: Diagnosis not present

## 2020-09-15 DIAGNOSIS — Z471 Aftercare following joint replacement surgery: Secondary | ICD-10-CM | POA: Diagnosis not present

## 2020-09-15 DIAGNOSIS — I10 Essential (primary) hypertension: Secondary | ICD-10-CM | POA: Diagnosis not present

## 2020-09-15 DIAGNOSIS — F32A Depression, unspecified: Secondary | ICD-10-CM | POA: Diagnosis not present

## 2020-09-15 DIAGNOSIS — J449 Chronic obstructive pulmonary disease, unspecified: Secondary | ICD-10-CM | POA: Diagnosis not present

## 2020-09-15 DIAGNOSIS — H269 Unspecified cataract: Secondary | ICD-10-CM | POA: Diagnosis not present

## 2020-09-15 DIAGNOSIS — M810 Age-related osteoporosis without current pathological fracture: Secondary | ICD-10-CM | POA: Diagnosis not present

## 2020-09-17 DIAGNOSIS — H269 Unspecified cataract: Secondary | ICD-10-CM | POA: Diagnosis not present

## 2020-09-17 DIAGNOSIS — G4733 Obstructive sleep apnea (adult) (pediatric): Secondary | ICD-10-CM | POA: Diagnosis not present

## 2020-09-17 DIAGNOSIS — I10 Essential (primary) hypertension: Secondary | ICD-10-CM | POA: Diagnosis not present

## 2020-09-17 DIAGNOSIS — M170 Bilateral primary osteoarthritis of knee: Secondary | ICD-10-CM | POA: Diagnosis not present

## 2020-09-17 DIAGNOSIS — M810 Age-related osteoporosis without current pathological fracture: Secondary | ICD-10-CM | POA: Diagnosis not present

## 2020-09-17 DIAGNOSIS — J449 Chronic obstructive pulmonary disease, unspecified: Secondary | ICD-10-CM | POA: Diagnosis not present

## 2020-09-17 DIAGNOSIS — F32A Depression, unspecified: Secondary | ICD-10-CM | POA: Diagnosis not present

## 2020-09-17 DIAGNOSIS — Z471 Aftercare following joint replacement surgery: Secondary | ICD-10-CM | POA: Diagnosis not present

## 2020-09-17 DIAGNOSIS — K219 Gastro-esophageal reflux disease without esophagitis: Secondary | ICD-10-CM | POA: Diagnosis not present

## 2020-09-19 DIAGNOSIS — J449 Chronic obstructive pulmonary disease, unspecified: Secondary | ICD-10-CM | POA: Diagnosis not present

## 2020-09-19 DIAGNOSIS — F32A Depression, unspecified: Secondary | ICD-10-CM | POA: Diagnosis not present

## 2020-09-19 DIAGNOSIS — H269 Unspecified cataract: Secondary | ICD-10-CM | POA: Diagnosis not present

## 2020-09-19 DIAGNOSIS — I10 Essential (primary) hypertension: Secondary | ICD-10-CM | POA: Diagnosis not present

## 2020-09-19 DIAGNOSIS — K219 Gastro-esophageal reflux disease without esophagitis: Secondary | ICD-10-CM | POA: Diagnosis not present

## 2020-09-19 DIAGNOSIS — G4733 Obstructive sleep apnea (adult) (pediatric): Secondary | ICD-10-CM | POA: Diagnosis not present

## 2020-09-19 DIAGNOSIS — Z471 Aftercare following joint replacement surgery: Secondary | ICD-10-CM | POA: Diagnosis not present

## 2020-09-19 DIAGNOSIS — M170 Bilateral primary osteoarthritis of knee: Secondary | ICD-10-CM | POA: Diagnosis not present

## 2020-09-19 DIAGNOSIS — M810 Age-related osteoporosis without current pathological fracture: Secondary | ICD-10-CM | POA: Diagnosis not present

## 2020-09-20 DIAGNOSIS — Z471 Aftercare following joint replacement surgery: Secondary | ICD-10-CM | POA: Diagnosis not present

## 2020-09-21 DIAGNOSIS — Z96642 Presence of left artificial hip joint: Secondary | ICD-10-CM | POA: Diagnosis not present

## 2020-09-21 DIAGNOSIS — Z471 Aftercare following joint replacement surgery: Secondary | ICD-10-CM | POA: Diagnosis not present

## 2020-09-24 DIAGNOSIS — Z471 Aftercare following joint replacement surgery: Secondary | ICD-10-CM | POA: Diagnosis not present

## 2020-09-26 DIAGNOSIS — Z471 Aftercare following joint replacement surgery: Secondary | ICD-10-CM | POA: Diagnosis not present

## 2020-10-01 DIAGNOSIS — Z471 Aftercare following joint replacement surgery: Secondary | ICD-10-CM | POA: Diagnosis not present

## 2020-10-03 DIAGNOSIS — Z471 Aftercare following joint replacement surgery: Secondary | ICD-10-CM | POA: Diagnosis not present

## 2020-10-05 DIAGNOSIS — Z471 Aftercare following joint replacement surgery: Secondary | ICD-10-CM | POA: Diagnosis not present

## 2020-10-08 DIAGNOSIS — Z471 Aftercare following joint replacement surgery: Secondary | ICD-10-CM | POA: Diagnosis not present

## 2020-10-10 DIAGNOSIS — Z471 Aftercare following joint replacement surgery: Secondary | ICD-10-CM | POA: Diagnosis not present

## 2020-10-15 DIAGNOSIS — Z471 Aftercare following joint replacement surgery: Secondary | ICD-10-CM | POA: Diagnosis not present

## 2020-10-23 DIAGNOSIS — Z471 Aftercare following joint replacement surgery: Secondary | ICD-10-CM | POA: Diagnosis not present

## 2020-10-25 DIAGNOSIS — Z471 Aftercare following joint replacement surgery: Secondary | ICD-10-CM | POA: Diagnosis not present

## 2020-10-29 DIAGNOSIS — Z471 Aftercare following joint replacement surgery: Secondary | ICD-10-CM | POA: Diagnosis not present

## 2020-11-01 DIAGNOSIS — Z471 Aftercare following joint replacement surgery: Secondary | ICD-10-CM | POA: Diagnosis not present

## 2020-11-06 DIAGNOSIS — Z471 Aftercare following joint replacement surgery: Secondary | ICD-10-CM | POA: Diagnosis not present

## 2020-11-08 DIAGNOSIS — Z471 Aftercare following joint replacement surgery: Secondary | ICD-10-CM | POA: Diagnosis not present

## 2020-11-12 ENCOUNTER — Encounter: Payer: Self-pay | Admitting: Internal Medicine

## 2020-11-12 DIAGNOSIS — Z471 Aftercare following joint replacement surgery: Secondary | ICD-10-CM | POA: Diagnosis not present

## 2020-11-13 NOTE — Progress Notes (Unsigned)
[place] Phone: [phone] Subjective:   [name], am serving as a scribe for Dr. Hulan Saas. This visit occurred during the SARS-CoV-2 public health emergency.  Safety protocols were in place, including screening questions prior to the visit, additional usage of staff PPE, and extensive cleaning of exam room while observing appropriate contact time as indicated for disinfecting solutions.   I'm seeing this patient by the request  of:  Biagio Borg, MD  CC: Bilateral knee pain follow-up  MMN:[id number]   04/04/2020 Viscosupplementation given today.  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  Strong family history of this.  Discussed icing regimen and home exercise, which activities to doing which wants to avoid.  Patient is to increase activity slowly over the course the next several weeks.  Hopefully patient will notice improvements again as she has previously with the viscosupplementation.  Follow-up again 8 to 12 weeks  Update 11/14/2020 BRIDGIT [name] is a 70 y.o. female coming in with complaint of bilateral knee pain. R>L. Feels that right knee never improved after last visit and is worse than the left. Feels crunching in the knee with physical therapy. Patient given visco on 04/04/2020. L THR 09/11/2020.   Patient states that the left hip is doing significantly better.  Patient states that the knee especially the right one is starting to give her more discomfort and pain again.  Patient states that she would like to do something to try to help it and would like to avoid any type of replacement at least for the next year.      Past Medical History:  Diagnosis Date  . ALLERGIC RHINITIS   . Allergy   . ANXIETY    no medations needed  . Arthritis   . Asthma   . Bronchitis   . Cataract    no surgery  . COLONIC  POLYPS, HX OF   . Complication of anesthesia    oxygen sat dropped during endoscopy   . COPD    pt unsure of this dx  . Cough   . DEPRESSION   . Fatty liver   . GERD   . GLUCOSE INTOLERANCE   . HYPERTENSION   . IBS   . Impaired glucose tolerance   . Obstructive sleep apnea 06/25/2018  . OSTEOPENIA   . Osteopenia   . Osteoporosis 07/30/2017  . PERIPHERAL EDEMA   . Pneumonia   . Pneumonia, organism unspecified(486)   . Sleep apnea    Oral Aparatus  . TOBACCO USE DISORDER/SMOKER-SMOKING CESSATION DISCUSSED    Past Surgical History:  Procedure Laterality Date  . birthmark removed     from back as a child  . bladder mesh     2012  . CESAREAN SECTION     x2  . CHOLECYSTECTOMY    . COLONOSCOPY  2018   TA  . MOUTH SURGERY  04/25/2020   tissue graft -room of mouth to front gums  . 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 WISDOM TOOTH EXTRACTION     Social History   Socioeconomic History  . Marital status: Widowed    Spouse name: Not on file  . Number of children: 2  . Years of education:  Not on file  . Highest education level: Not on file  Occupational History  . Occupation: Occupational psychologist industries, Museum/gallery curator for [employer]  Tobacco Use  . Smoking status: Current Every Day Smoker    Packs/day: 1.00    Years: 42.00    Pack years: 42.00    Types: Cigarettes    Start date: 75  . Smokeless tobacco: Never Used  Vaping Use  . Vaping Use: Never used  Substance and Sexual Activity  . Alcohol use: Yes    Alcohol/week: 21.0 standard drinks    Types: 7 Glasses of wine, 14 Standard drinks or equivalent per week    Comment: couple of drinks 1-2 nites per week   . Drug use: No  . Sexual activity: Not on file  Other Topics Concern  . Not on file  Social History Narrative  . Not on file   Social  Determinants of Health   Financial Resource Strain: Not on file  Food Insecurity: Not on file  Transportation Needs: Not on file  Physical Activity: Not on file  Stress: Not on file  Social Connections: Not on file   Allergies  Allergen Reactions  . Ace Inhibitors Cough  . Penicillins Rash   Family History  Problem Relation Age of Onset  . Breast cancer Sister 40       +lump and tamoxifen; eventually had bilateral mastectomies  . Allergic rhinitis Sister   . Colon cancer Father        dx. >12  . Bladder Cancer Father        dx. 5-79; not a smoker  . Skin cancer Father        basal cell carcinoma  . Colon polyps Father   . Colon cancer Paternal Grandfather        unspecified age  . Melanoma Paternal Grandfather        unspecified age  . Skin cancer Mother        NOS; dx. 24s  . Other Mother        hx of hysterectomy in her early 81s for fibroid cysts  . Heart attack Maternal Uncle 67  . Heart attack Maternal Grandmother 62  . Thyroid cancer Paternal Grandmother 88  . Breast cancer Sister        dx. early 55s; s/p BL mastectomies  . Cervical cancer Sister        dx. late 53s  . Skin cancer Brother        NOS  . Anxiety disorder Other   . Hypothyroidism Other   . Hyperlipidemia Other   . Diabetes Other   . Esophageal cancer Neg Hx   . Stomach cancer Neg Hx   . Rectal cancer Neg Hx     Current Outpatient Medications (Endocrine & Metabolic):  .  denosumab (PROLIA) 60 MG/ML SOSY injection, Inject 60 mg into the skin every 6 (six) months.  Current Outpatient Medications (Cardiovascular):  .  losartan (COZAAR) 50 MG tablet, Take 1 tablet (50 mg total) by mouth daily. .  metoprolol succinate (TOPROL-XL) 25 MG 24 hr tablet, Take 1 tablet (25 mg total) by mouth daily.  Current Outpatient Medications (Respiratory):  .  albuterol (VENTOLIN HFA) 108 (90 Base) MCG/ACT inhaler, Inhale 1-2 puffs into the lungs every 4 (four) hours as needed. Marland Kitchen  Fexofenadine HCl (MUCINEX  ALLERGY PO), Take 600 mg by mouth as needed. .  fluticasone (FLONASE) 50 MCG/ACT nasal spray, Place 2 sprays into both nostrils daily as  needed for allergies or rhinitis.  Current Outpatient Medications (Analgesics):  .  aspirin EC 81 MG tablet, Take 1 tablet (81 mg total) by mouth 2 (two) times daily after a meal. Swallow whole. Marland Kitchen  HYDROcodone-acetaminophen (NORCO/VICODIN) 5-325 MG tablet, Take 1-2 tablets by mouth every 6 (six) hours as needed for moderate pain.  Current Outpatient Medications (Hematological):  .  folic acid (FOLVITE) 601 MCG tablet, Take 400 mcg by mouth daily. .  vitamin B-12 (CYANOCOBALAMIN) 1000 MCG tablet, Take 1 tablet (1,000 mcg total) by mouth daily.  Current Outpatient Medications (Other):  Marland Kitchen  Calcium Carb-Cholecalciferol 600-800 MG-UNIT TABS, Take 1 tablet by mouth in the morning and at bedtime.  .  Cholecalciferol (VITAMIN D PO), Take 2,000 Units by mouth daily.  Marland Kitchen  tiZANidine (ZANAFLEX) 4 MG tablet, Take 1 tablet (4 mg total) by mouth every 6 (six) hours as needed for muscle spasms.   Reviewed prior external information including notes and imaging from  primary care provider As well as notes that were available from care everywhere and other healthcare systems.  Past medical history, social, surgical and family history all reviewed in electronic medical record.  No pertanent information unless stated regarding to the chief complaint.   Review of Systems:  No headache, visual changes, nausea, vomiting, diarrhea, constipation, dizziness, abdominal pain, skin rash, fevers, chills, night sweats, weight loss, swollen lymph nodes, body aches, joint swelling, chest pain, shortness of breath, mood changes. POSITIVE muscle aches  Objective  Blood pressure 122/90, pulse 87, height 5' 2"  (1.575 m), weight 186 lb (84.4 kg), SpO2 99 %.   General: No apparent distress alert and oriented x3 mood and affect normal, dressed appropriately.  HEENT: Pupils equal, extraocular  movements intact  Respiratory: Patient's speak in full sentences and does not appear short of breath  Cardiovascular: No lower extremity edema, non tender, no erythema  Gait normal with good balance and coordination.  MSK:   Knee: Right valgus deformity noted.  Abnormal thigh to calf ratio.  Tender to palpation over medial and PF joint line.  ROM full in flexion and extension and lower leg rotation. instability with valgus force.  painful patellar compression. Patellar glide with moderate crepitus. Patellar and quadriceps tendons unremarkable. Hamstring and quadriceps strength is normal. Contralateral knee shows arthritic changes as well with some instability but nontender today.  After informed written and verbal consent, patient was seated on exam table. Right knee was prepped with alcohol swab and utilizing anterolateral approach, patient's right knee space was injected with 4:1  marcaine 0.5%: Kenalog 60m/dL. Patient tolerated the procedure well without immediate complications.    Impression and Recommendations:     The above documentation has been reviewed and is accurate and complete ZLyndal Pulley DO

## 2020-11-14 ENCOUNTER — Ambulatory Visit: Payer: Medicare HMO | Admitting: Family Medicine

## 2020-11-14 ENCOUNTER — Ambulatory Visit: Payer: Medicare HMO

## 2020-11-14 ENCOUNTER — Ambulatory Visit (INDEPENDENT_AMBULATORY_CARE_PROVIDER_SITE_OTHER): Payer: Medicare HMO

## 2020-11-14 ENCOUNTER — Other Ambulatory Visit: Payer: Self-pay

## 2020-11-14 ENCOUNTER — Encounter: Payer: Self-pay | Admitting: Family Medicine

## 2020-11-14 VITALS — BP 122/90 | HR 87 | Ht 62.0 in | Wt 186.0 lb

## 2020-11-14 DIAGNOSIS — G8929 Other chronic pain: Secondary | ICD-10-CM

## 2020-11-14 DIAGNOSIS — M25561 Pain in right knee: Secondary | ICD-10-CM

## 2020-11-14 DIAGNOSIS — M170 Bilateral primary osteoarthritis of knee: Secondary | ICD-10-CM | POA: Diagnosis not present

## 2020-11-14 DIAGNOSIS — M25562 Pain in left knee: Secondary | ICD-10-CM

## 2020-11-14 NOTE — Patient Instructions (Addendum)
Xrays today Injected right knee today Will get approval for gel See me again in 4-5 weeks

## 2020-11-14 NOTE — Assessment & Plan Note (Signed)
Patient given injection today secondary to chronic problem with exacerbation.  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  We will get new x-rays with pending from 2017.  Patient recently did get a left hip replacement and is doing well and patient is encouraged to increase activity as tolerated.  Patient could be a candidate again for viscosupplementation and will hopefully make some more progress.  Follow-up with me again in 4 weeks

## 2020-11-15 DIAGNOSIS — Z471 Aftercare following joint replacement surgery: Secondary | ICD-10-CM | POA: Diagnosis not present

## 2020-11-19 DIAGNOSIS — Z471 Aftercare following joint replacement surgery: Secondary | ICD-10-CM | POA: Diagnosis not present

## 2020-11-22 DIAGNOSIS — Z471 Aftercare following joint replacement surgery: Secondary | ICD-10-CM | POA: Diagnosis not present

## 2020-11-26 DIAGNOSIS — Z471 Aftercare following joint replacement surgery: Secondary | ICD-10-CM | POA: Diagnosis not present

## 2020-11-29 DIAGNOSIS — Z471 Aftercare following joint replacement surgery: Secondary | ICD-10-CM | POA: Diagnosis not present

## 2020-12-03 DIAGNOSIS — Z471 Aftercare following joint replacement surgery: Secondary | ICD-10-CM | POA: Diagnosis not present

## 2020-12-06 DIAGNOSIS — Z471 Aftercare following joint replacement surgery: Secondary | ICD-10-CM | POA: Diagnosis not present

## 2020-12-07 DIAGNOSIS — Z471 Aftercare following joint replacement surgery: Secondary | ICD-10-CM | POA: Diagnosis not present

## 2020-12-10 DIAGNOSIS — Z471 Aftercare following joint replacement surgery: Secondary | ICD-10-CM | POA: Diagnosis not present

## 2020-12-13 DIAGNOSIS — Z471 Aftercare following joint replacement surgery: Secondary | ICD-10-CM | POA: Diagnosis not present

## 2020-12-17 DIAGNOSIS — Z471 Aftercare following joint replacement surgery: Secondary | ICD-10-CM | POA: Diagnosis not present

## 2020-12-18 ENCOUNTER — Telehealth: Payer: Self-pay | Admitting: Internal Medicine

## 2020-12-18 NOTE — Telephone Encounter (Signed)
Patient is interested in getting her prolia injection. She would like a call back at [phone]

## 2020-12-19 NOTE — Progress Notes (Signed)
[facility] [address] [place] Phone: [phone] Subjective:   I Crystal [name] am serving as a Education administrator for Dr. Hulan Saas.  This visit occurred during the SARS-CoV-2 public health emergency.  Safety protocols were in place, including screening questions prior to the visit, additional usage of staff PPE, and extensive cleaning of exam room while observing appropriate contact time as indicated for disinfecting solutions.   I'm seeing this patient by the request  of:  Biagio Borg, MD  CC: Bilateral knee pain follow-up  NTZ:[id number]   11/14/2020 Patient given injection today secondary to chronic problem with exacerbation.  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  We will get new x-rays with pending from 2017.  Patient recently did get a left hip replacement and is doing well and patient is encouraged to increase activity as tolerated.  Patient could be a candidate again for viscosupplementation and will hopefully make some more progress.  Follow-up with me again in 4 weeks  Update 12/20/2020 Crystal [name] is a 70 y.o. female coming in with complaint of bilateral knee pain.Patient states she would like injectio in right knee. Patient states she is enjoying PT. Going 2 times a week and went this morning. Going to PT for her hip. Monovisc approved today.  Patient feels like it is only the right knee that needs it.  Still feels that the left knee injections previously helped out significantly.     Past Medical History:  Diagnosis Date  . ALLERGIC RHINITIS   . Allergy   . ANXIETY    no medations needed  . Arthritis   . Asthma   . Bronchitis   . Cataract    no surgery  . COLONIC POLYPS, HX OF   . Complication of anesthesia    oxygen sat dropped during endoscopy   . COPD    pt unsure of this dx  . Cough   . DEPRESSION   . Fatty liver   . GERD   . GLUCOSE INTOLERANCE   . HYPERTENSION   . IBS   . Impaired  glucose tolerance   . Obstructive sleep apnea 06/25/2018  . OSTEOPENIA   . Osteopenia   . Osteoporosis 07/30/2017  . PERIPHERAL EDEMA   . Pneumonia   . Pneumonia, organism unspecified(486)   . Sleep apnea    Oral Aparatus  . TOBACCO USE DISORDER/SMOKER-SMOKING CESSATION DISCUSSED    Past Surgical History:  Procedure Laterality Date  . birthmark removed     from back as a child  . bladder mesh     2012  . CESAREAN SECTION     x2  . CHOLECYSTECTOMY    . COLONOSCOPY  2018   TA  . MOUTH SURGERY  04/25/2020   tissue graft -room of mouth to front gums  . 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 WISDOM TOOTH EXTRACTION     Social History   Socioeconomic History  . Marital status: Widowed    Spouse name: Not on file  . Number of children: 2  . Years of education: Not on file  . Highest education level: Not on file  Occupational History  . Occupation: Occupational psychologist industries, Museum/gallery curator for [employer]  Tobacco Use  . Smoking status: Current Every Day Smoker    Packs/day: 1.00  Years: 42.00    Pack years: 42.00    Types: Cigarettes    Start date: 28  . Smokeless tobacco: Never Used  Vaping Use  . Vaping Use: Never used  Substance and Sexual Activity  . Alcohol use: Yes    Alcohol/week: 21.0 standard drinks    Types: 7 Glasses of wine, 14 Standard drinks or equivalent per week    Comment: couple of drinks 1-2 nites per week   . Drug use: No  . Sexual activity: Not on file  Other Topics Concern  . Not on file  Social History Narrative  . Not on file   Social Determinants of Health   Financial Resource Strain: Not on file  Food Insecurity: Not on file  Transportation Needs: Not on file  Physical Activity: Not on file  Stress: Not on file  Social Connections: Not on file   Allergies  Allergen  Reactions  . Ace Inhibitors Cough  . Penicillins Rash   Family History  Problem Relation Age of Onset  . Breast cancer Sister 40       +lump and tamoxifen; eventually had bilateral mastectomies  . Allergic rhinitis Sister   . Colon cancer Father        dx. >69  . Bladder Cancer Father        dx. 68-79; not a smoker  . Skin cancer Father        basal cell carcinoma  . Colon polyps Father   . Colon cancer Paternal Grandfather        unspecified age  . Melanoma Paternal Grandfather        unspecified age  . Skin cancer Mother        NOS; dx. 30s  . Other Mother        hx of hysterectomy in her early 41s for fibroid cysts  . Heart attack Maternal Uncle 67  . Heart attack Maternal Grandmother 62  . Thyroid cancer Paternal Grandmother 88  . Breast cancer Sister        dx. early 50s; s/p BL mastectomies  . Cervical cancer Sister        dx. late 21s  . Skin cancer Brother        NOS  . Anxiety disorder Other   . Hypothyroidism Other   . Hyperlipidemia Other   . Diabetes Other   . Esophageal cancer Neg Hx   . Stomach cancer Neg Hx   . Rectal cancer Neg Hx     Current Outpatient Medications (Endocrine & Metabolic):  .  denosumab (PROLIA) 60 MG/ML SOSY injection, Inject 60 mg into the skin every 6 (six) months.  Current Outpatient Medications (Cardiovascular):  .  losartan (COZAAR) 50 MG tablet, Take 1 tablet (50 mg total) by mouth daily. .  metoprolol succinate (TOPROL-XL) 25 MG 24 hr tablet, Take 1 tablet (25 mg total) by mouth daily.  Current Outpatient Medications (Respiratory):  .  albuterol (VENTOLIN HFA) 108 (90 Base) MCG/ACT inhaler, Inhale 1-2 puffs into the lungs every 4 (four) hours as needed. Marland Kitchen  Fexofenadine HCl (MUCINEX ALLERGY PO), Take 600 mg by mouth as needed. .  fluticasone (FLONASE) 50 MCG/ACT nasal spray, Place 2 sprays into both nostrils daily as needed for allergies or rhinitis.  Current Outpatient Medications (Analgesics):  .  aspirin EC 81 MG  tablet, Take 1 tablet (81 mg total) by mouth 2 (two) times daily after a meal. Swallow whole. Marland Kitchen  HYDROcodone-acetaminophen (NORCO/VICODIN) 5-325 MG tablet, Take 1-2  tablets by mouth every 6 (six) hours as needed for moderate pain.  Current Outpatient Medications (Hematological):  .  folic acid (FOLVITE) 937 MCG tablet, Take 400 mcg by mouth daily. .  vitamin B-12 (CYANOCOBALAMIN) 1000 MCG tablet, Take 1 tablet (1,000 mcg total) by mouth daily.  Current Outpatient Medications (Other):  Marland Kitchen  Calcium Carb-Cholecalciferol 600-800 MG-UNIT TABS, Take 1 tablet by mouth in the morning and at bedtime.  .  Cholecalciferol (VITAMIN D PO), Take 2,000 Units by mouth daily.  Marland Kitchen  tiZANidine (ZANAFLEX) 4 MG tablet, Take 1 tablet (4 mg total) by mouth every 6 (six) hours as needed for muscle spasms.   Reviewed prior external information including notes and imaging from  primary care provider As well as notes that were available from care everywhere and other healthcare systems.  Past medical history, social, surgical and family history all reviewed in electronic medical record.  No pertanent information unless stated regarding to the chief complaint.   Review of Systems:  No headache, visual changes, nausea, vomiting, diarrhea, constipation, dizziness, abdominal pain, skin rash, fevers, chills, night sweats, weight loss, swollen lymph nodes, body aches, joint swelling, chest pain, shortness of breath, mood changes. POSITIVE muscle aches  Objective  Blood pressure 120/80, pulse 76, height 5' 2"  (1.575 m), weight 184 lb (83.5 kg), SpO2 97 %.   General: No apparent distress alert and oriented x3 mood and affect normal, dressed appropriately.  HEENT: Pupils equal, extraocular movements intact  Respiratory: Patient's speak in full sentences and does not appear short of breath  Cardiovascular: No lower extremity edema, non tender, no erythema  Gait gait is actually improved especially with patient not  compensating for the left hip as much. MSK: Right knee though does have significant arthritic changes.  Tender to palpation over the medial joint line.  Crepitus noted of the patellofemoral joint with lateral tracking.  Positive patellar grind test noted.  After informed written and verbal consent, patient was seated on exam table. Right knee was prepped with alcohol swab and utilizing anterolateral approach, patient's right knee space was injected with 48 mg per 3 mL of Monovisc (sodium hyaluronate) in a prefilled syringe was injected easily into the knee through a 22-gauge needle..Patient tolerated the procedure well without immediate complications.    Impression and Recommendations:     The above documentation has been reviewed and is accurate and complete Lyndal Pulley, DO

## 2020-12-20 ENCOUNTER — Other Ambulatory Visit: Payer: Self-pay

## 2020-12-20 ENCOUNTER — Ambulatory Visit: Payer: Medicare HMO | Admitting: Family Medicine

## 2020-12-20 ENCOUNTER — Encounter: Payer: Self-pay | Admitting: Family Medicine

## 2020-12-20 DIAGNOSIS — M170 Bilateral primary osteoarthritis of knee: Secondary | ICD-10-CM

## 2020-12-20 DIAGNOSIS — Z471 Aftercare following joint replacement surgery: Secondary | ICD-10-CM | POA: Diagnosis not present

## 2020-12-20 NOTE — Assessment & Plan Note (Signed)
Right-sided viscosupplementation given today.  Tolerated the procedure well.  Does have known arthritic changes and likely this is contributing.  Patient did have a hip replacement on the left side and was likely compensating for quite some time.  Discussed home exercises and icing regimen.  Encouraged her to continue to work on the weight loss which patient has done remarkably well at this time.  Follow-up with me again 2 to 3 months

## 2020-12-20 NOTE — Patient Instructions (Addendum)
Good to see you It should feel full Call or seek medical attention if you have any concers See me again in 2-3 months if needed

## 2020-12-24 DIAGNOSIS — Z471 Aftercare following joint replacement surgery: Secondary | ICD-10-CM | POA: Diagnosis not present

## 2020-12-27 DIAGNOSIS — Z471 Aftercare following joint replacement surgery: Secondary | ICD-10-CM | POA: Diagnosis not present

## 2020-12-31 DIAGNOSIS — Z471 Aftercare following joint replacement surgery: Secondary | ICD-10-CM | POA: Diagnosis not present

## 2021-01-02 ENCOUNTER — Other Ambulatory Visit: Payer: Self-pay | Admitting: Internal Medicine

## 2021-01-02 NOTE — Telephone Encounter (Signed)
Please refill as per office routine med refill policy (all routine meds refilled for 3 mo or monthly per pt preference up to one year from last visit, then month to month grace period for 3 mo, then further med refills will have to be denied)  

## 2021-01-03 DIAGNOSIS — Z471 Aftercare following joint replacement surgery: Secondary | ICD-10-CM | POA: Diagnosis not present

## 2021-01-07 DIAGNOSIS — Z471 Aftercare following joint replacement surgery: Secondary | ICD-10-CM | POA: Diagnosis not present

## 2021-01-10 DIAGNOSIS — Z471 Aftercare following joint replacement surgery: Secondary | ICD-10-CM | POA: Diagnosis not present

## 2021-01-14 DIAGNOSIS — Z471 Aftercare following joint replacement surgery: Secondary | ICD-10-CM | POA: Diagnosis not present

## 2021-01-17 DIAGNOSIS — Z471 Aftercare following joint replacement surgery: Secondary | ICD-10-CM | POA: Diagnosis not present

## 2021-01-21 DIAGNOSIS — Z471 Aftercare following joint replacement surgery: Secondary | ICD-10-CM | POA: Diagnosis not present

## 2021-01-23 ENCOUNTER — Ambulatory Visit (INDEPENDENT_AMBULATORY_CARE_PROVIDER_SITE_OTHER): Payer: Medicare HMO

## 2021-01-23 ENCOUNTER — Other Ambulatory Visit: Payer: Self-pay

## 2021-01-23 DIAGNOSIS — M810 Age-related osteoporosis without current pathological fracture: Secondary | ICD-10-CM | POA: Diagnosis not present

## 2021-01-23 MED ORDER — DENOSUMAB 60 MG/ML ~~LOC~~ SOSY
60.0000 mg | PREFILLED_SYRINGE | Freq: Once | SUBCUTANEOUS | Status: AC
  Administered 2021-01-23: 60 mg via SUBCUTANEOUS

## 2021-01-23 NOTE — Progress Notes (Signed)
Pt here for  Prolia injection per Dr. Jenny Reichmann  Prolia 60 mg given Subcutaneous in left arm , and pt tolerated injection well.  Prolia injection scheduled for 07/29/21.

## 2021-01-24 DIAGNOSIS — Z471 Aftercare following joint replacement surgery: Secondary | ICD-10-CM | POA: Diagnosis not present

## 2021-01-28 DIAGNOSIS — Z471 Aftercare following joint replacement surgery: Secondary | ICD-10-CM | POA: Diagnosis not present

## 2021-01-31 DIAGNOSIS — Z471 Aftercare following joint replacement surgery: Secondary | ICD-10-CM | POA: Diagnosis not present

## 2021-02-04 DIAGNOSIS — Z471 Aftercare following joint replacement surgery: Secondary | ICD-10-CM | POA: Diagnosis not present

## 2021-02-07 DIAGNOSIS — Z471 Aftercare following joint replacement surgery: Secondary | ICD-10-CM | POA: Diagnosis not present

## 2021-02-11 DIAGNOSIS — Z471 Aftercare following joint replacement surgery: Secondary | ICD-10-CM | POA: Diagnosis not present

## 2021-02-18 ENCOUNTER — Ambulatory Visit: Payer: Medicare HMO | Admitting: Allergy

## 2021-02-18 ENCOUNTER — Other Ambulatory Visit: Payer: Self-pay

## 2021-02-18 ENCOUNTER — Telehealth: Payer: Self-pay | Admitting: Internal Medicine

## 2021-02-18 ENCOUNTER — Encounter: Payer: Self-pay | Admitting: Allergy

## 2021-02-18 VITALS — BP 128/80 | HR 99 | Temp 97.1°F | Resp 18 | Ht 63.0 in | Wt 185.8 lb

## 2021-02-18 DIAGNOSIS — J3089 Other allergic rhinitis: Secondary | ICD-10-CM | POA: Diagnosis not present

## 2021-02-18 DIAGNOSIS — J309 Allergic rhinitis, unspecified: Secondary | ICD-10-CM

## 2021-02-18 DIAGNOSIS — J449 Chronic obstructive pulmonary disease, unspecified: Secondary | ICD-10-CM | POA: Diagnosis not present

## 2021-02-18 DIAGNOSIS — J302 Other seasonal allergic rhinitis: Secondary | ICD-10-CM

## 2021-02-18 DIAGNOSIS — Z471 Aftercare following joint replacement surgery: Secondary | ICD-10-CM | POA: Diagnosis not present

## 2021-02-18 MED ORDER — ALBUTEROL SULFATE HFA 108 (90 BASE) MCG/ACT IN AERS
1.0000 | INHALATION_SPRAY | RESPIRATORY_TRACT | 1 refills | Status: DC | PRN
Start: 2021-02-18 — End: 2022-04-24

## 2021-02-18 NOTE — Assessment & Plan Note (Signed)
Past history - 2019 skin testing was positive to dust mites, grass, trees and borderline to mouse. Interim history - only using meds prn with good benefit.   Continue environmental control measures.   May use over the counter antihistamines such as Zyrtec (cetirizine), Claritin (loratadine), Allegra (fexofenadine), or Xyzal (levocetirizine) daily as needed.  May use Flonase (fluticasone) nasal spray 1 spray per nostril twice a day as needed for nasal congestion.   Nasal saline spray (i.e., Simply Saline) or nasal saline lavage (i.e., NeilMed) is recommended as needed and prior to medicated nasal sprays.

## 2021-02-18 NOTE — Telephone Encounter (Signed)
Ok done

## 2021-02-18 NOTE — Progress Notes (Signed)
Follow Up Note  RE: Crystal [name] MRN: [id number] DOB: 10-01-1951 Date of Office Visit: 02/18/2021  Referring provider: Biagio Borg, MD Primary care provider: Biagio Borg, MD  Chief Complaint: Asthma (Needs refills on inhalers /ACT - 22/Uses her rescue inhaler before physical activity - water aerobics - causes SOB and some tiredness  )  History of Present Illness: I had the pleasure of seeing [name] for a follow up visit at the [facility] on 02/18/2021. She is a 70 y.o. female, who is being followed for allergic rhinitis and COPD. Her previous allergy office visit was on 04/25/2019 with Dr. Verlin Fester. Today is a regular follow up visit.  COPD  ACT score 22.  Patient is smoking about 1/2 to 3/4 pack per day which is apparently less than before.  Has dyspnea on exertion mainly during physical therapy and swimming.   Denies any wheezing, chest tightness, nocturnal awakenings, ER/urgent care visits or prednisone use since the last visit. Sometimes gets a coughing fit if she swallows her food the wrong way.   Patient is going to [place] this summer to visit her daughter and wants to have albuterol on hand just in case.   Other allergic rhinitis PND is better now.  Using Claritin as needed.  Using Flonase and saline wash as needed. No nosebleeds.   Assessment and Plan: Baby is a 70 y.o. female with: COPD Dyspnea on exertion mainly. No prior pulmonology evaluation. Using albuterol on rare occasions. Going to [place] and wants refill on albuterol. No flares in the past 2 years.   Today's spirometry was normal. ACT score 22.  Discussed smoking cessation. . Daily controller medication(s): none. Marland Kitchen Spacer given and demonstrated proper use with inhaler. Patient understood technique and all questions/concerned were addressed.  . May use albuterol rescue inhaler 2 puffs every 4 to 6 hours as needed for shortness of breath, chest tightness, coughing, and  wheezing. May use albuterol rescue inhaler 2 puffs 5 to 15 minutes prior to strenuous physical activities. Monitor frequency of use.  . Repeat spirometry at next visit.   Seasonal and perennial allergic rhinitis Past history - 2019 skin testing was positive to dust mites, grass, trees and borderline to mouse. Interim history - only using meds prn with good benefit.   Continue environmental control measures.   May use over the counter antihistamines such as Zyrtec (cetirizine), Claritin (loratadine), Allegra (fexofenadine), or Xyzal (levocetirizine) daily as needed.  May use Flonase (fluticasone) nasal spray 1 spray per nostril twice a day as needed for nasal congestion.   Nasal saline spray (i.e., Simply Saline) or nasal saline lavage (i.e., NeilMed) is recommended as needed and prior to medicated nasal sprays.  Return in about 1 year (around 02/18/2022).  Meds ordered this encounter  Medications  . albuterol (VENTOLIN HFA) 108 (90 Base) MCG/ACT inhaler    Sig: Inhale 1-2 puffs into the lungs every 4 (four) hours as needed for wheezing or shortness of breath (coughing).    Dispense:  18 g    Refill:  1   Lab Orders  No laboratory test(s) ordered today    Diagnostics: Spirometry:  Tracings reviewed. Her effort: Good reproducible efforts. FVC: 2.44L FEV1: 1.97L, 91% predicted FEV1/FVC ratio: 81% Interpretation: Spirometry consistent with normal pattern.  Please see scanned spirometry results for details.  Medication List:  Current Outpatient Medications  Medication Sig Dispense Refill  . aspirin EC 81 MG tablet Take 1 tablet (81 mg total)  by mouth 2 (two) times daily after a meal. Swallow whole. 60 tablet 0  . Calcium Carb-Cholecalciferol 600-800 MG-UNIT TABS Take 1 tablet by mouth in the morning and at bedtime.     . Cholecalciferol (VITAMIN D PO) Take 2,000 Units by mouth daily.     Marland Kitchen denosumab (PROLIA) 60 MG/ML SOSY injection Inject 60 mg into the skin every 6 (six) months.     . Fexofenadine HCl (MUCINEX ALLERGY PO) Take 600 mg by mouth as needed.    Marland Kitchen HYDROcodone-acetaminophen (NORCO/VICODIN) 5-325 MG tablet Take 1-2 tablets by mouth every 6 (six) hours as needed for moderate pain. 40 tablet 0  . losartan (COZAAR) 50 MG tablet Take 1 tablet (50 mg total) by mouth daily. Annual appt due in May must see provider for future refills 90 tablet 0  . metoprolol succinate (TOPROL-XL) 25 MG 24 hr tablet Take 1 tablet (25 mg total) by mouth daily. 90 tablet 3  . tiZANidine (ZANAFLEX) 4 MG tablet Take 1 tablet (4 mg total) by mouth every 6 (six) hours as needed for muscle spasms. 40 tablet 1  . vitamin B-12 (CYANOCOBALAMIN) 1000 MCG tablet Take 1 tablet (1,000 mcg total) by mouth daily. 90 tablet 3  . albuterol (VENTOLIN HFA) 108 (90 Base) MCG/ACT inhaler Inhale 1-2 puffs into the lungs every 4 (four) hours as needed for wheezing or shortness of breath (coughing). 18 g 1  . fluticasone (FLONASE) 50 MCG/ACT nasal spray Place 2 sprays into both nostrils daily as needed for allergies or rhinitis. (Patient not taking: Reported on 02/18/2021)    . folic acid (FOLVITE) 782 MCG tablet Take 400 mcg by mouth daily. (Patient not taking: Reported on 02/18/2021)     No current facility-administered medications for this visit.   Allergies: Allergies  Allergen Reactions  . Ace Inhibitors Cough  . Penicillins Rash   I reviewed her past medical history, social history, family history, and environmental history and no significant changes have been reported from her previous visit.  Review of Systems  Constitutional: Negative for appetite change, chills, fever and unexpected weight change.  HENT: Negative for congestion and rhinorrhea.   Eyes: Negative for itching.  Respiratory: Negative for cough, chest tightness, shortness of breath and wheezing.   Gastrointestinal: Negative for abdominal pain.  Skin: Negative for rash.  Allergic/Immunologic: Positive for environmental allergies.   Neurological: Negative for headaches.   Objective: BP 128/80   Pulse 99   Temp (!) 97.1 F (36.2 C)   Resp 18   Ht 5' 3"  (1.6 m)   Wt 185 lb 12.8 oz (84.3 kg)   SpO2 96%   BMI 32.91 kg/m  Body mass index is 32.91 kg/m. Physical Exam Vitals and nursing note reviewed.  Constitutional:      Appearance: Normal appearance. She is well-developed.  HENT:     Head: Normocephalic and atraumatic.     Right Ear: External ear normal.     Left Ear: External ear normal.     Nose: Nose normal.     Mouth/Throat:     Mouth: Mucous membranes are moist.     Pharynx: Oropharynx is clear.  Eyes:     Conjunctiva/sclera: Conjunctivae normal.  Cardiovascular:     Rate and Rhythm: Normal rate and regular rhythm.     Heart sounds: Normal heart sounds. No murmur heard.   Pulmonary:     Effort: Pulmonary effort is normal.     Breath sounds: Normal breath sounds. No wheezing, rhonchi or rales.  Musculoskeletal:     Cervical back: Neck supple.  Skin:    General: Skin is warm.     Findings: No rash.  Neurological:     Mental Status: She is alert and oriented to person, place, and time.  Psychiatric:        Behavior: Behavior normal.    Previous notes and tests were reviewed. The plan was reviewed with the patient/family, and all questions/concerned were addressed.  It was my pleasure to see Kenia today and participate in her care. Please feel free to contact me with any questions or concerns.  Sincerely,  Rexene Alberts, DO Allergy & Immunology  [facility] office: [place] office: [phone]

## 2021-02-18 NOTE — Patient Instructions (Addendum)
Breathing: . Try to smoke less.  . Daily controller medication(s): none. Marland Kitchen Spacer given and demonstrated proper use with inhaler. Patient understood technique and all questions/concerned were addressed.   . May use albuterol rescue inhaler 2 puffs every 4 to 6 hours as needed for shortness of breath, chest tightness, coughing, and wheezing. May use albuterol rescue inhaler 2 puffs 5 to 15 minutes prior to strenuous physical activities. Monitor frequency of use.  . Breathing ontrol goals:  o Full participation in all desired activities (may need albuterol before activity) o Albuterol use two times or less a week on average (not counting use with activity) o Cough interfering with sleep two times or less a month o Oral steroids no more than once a year o No hospitalizations  Allergic rhinitis:  2019 skin testing was positive to dust mites, grass, trees and borderline to mouse.  Continue environmental control measures.   May use over the counter antihistamines such as Zyrtec (cetirizine), Claritin (loratadine), Allegra (fexofenadine), or Xyzal (levocetirizine) daily as needed.  May use Flonase (fluticasone) nasal spray 1 spray per nostril twice a day as needed for nasal congestion.   Nasal saline spray (i.e., Simply Saline) or nasal saline lavage (i.e., NeilMed) is recommended as needed and prior to medicated nasal sprays.  Follow up in 1 year or sooner if needed.  Control of House Dust Mite Allergen . Dust mite allergens are a common trigger of allergy and asthma symptoms. While they can be found throughout the house, these microscopic creatures thrive in warm, humid environments such as bedding, upholstered furniture and carpeting. . Because so much time is spent in the bedroom, it is essential to reduce mite levels there.  . Encase pillows, mattresses, and box springs in special allergen-proof fabric covers or airtight, zippered plastic covers.  . Bedding should be washed weekly in hot  water (130 F) and dried in a hot dryer. Allergen-proof covers are available for comforters and pillows that can't be regularly washed.  Wendee Copp the allergy-proof covers every few months. Minimize clutter in the bedroom. Keep pets out of the bedroom.  Marland Kitchen Keep humidity less than 50% by using a dehumidifier or air conditioning. You can buy a humidity measuring device called a hygrometer to monitor this.  . If possible, replace carpets with hardwood, linoleum, or washable area rugs. If that's not possible, vacuum frequently with a vacuum that has a HEPA filter. . Remove all upholstered furniture and non-washable window drapes from the bedroom. . Remove all non-washable stuffed toys from the bedroom.  Wash stuffed toys weekly. Reducing Pollen Exposure . Pollen seasons: trees (spring), grass (summer) and ragweed/weeds (fall). Marland Kitchen Keep windows closed in your home and car to lower pollen exposure.  Susa Simmonds air conditioning in the bedroom and throughout the house if possible.  . Avoid going out in dry windy days - especially early morning. . Pollen counts are highest between 5 - 10 AM and on dry, hot and windy days.  . Save outside activities for late afternoon or after a heavy rain, when pollen levels are lower.  . Avoid mowing of grass if you have grass pollen allergy. Marland Kitchen Be aware that pollen can also be transported indoors on people and pets.  . Dry your clothes in an automatic dryer rather than hanging them outside where they might collect pollen.  . Rinse hair and eyes before bedtime.

## 2021-02-18 NOTE — Telephone Encounter (Signed)
Patient called and said that she was able to get an appointment at [facility]. She said that her appointment is today at 3:30 and is requesting a referral. Please advise    Phone: [phone]

## 2021-02-18 NOTE — Assessment & Plan Note (Signed)
Dyspnea on exertion mainly. No prior pulmonology evaluation. Using albuterol on rare occasions. Going to [place] and wants refill on albuterol. No flares in the past 2 years.   Today's spirometry was normal. ACT score 22.  Discussed smoking cessation. . Daily controller medication(s): none. Marland Kitchen Spacer given and demonstrated proper use with inhaler. Patient understood technique and all questions/concerned were addressed.  . May use albuterol rescue inhaler 2 puffs every 4 to 6 hours as needed for shortness of breath, chest tightness, coughing, and wheezing. May use albuterol rescue inhaler 2 puffs 5 to 15 minutes prior to strenuous physical activities. Monitor frequency of use.  . Repeat spirometry at next visit.

## 2021-02-21 DIAGNOSIS — Z471 Aftercare following joint replacement surgery: Secondary | ICD-10-CM | POA: Diagnosis not present

## 2021-02-21 NOTE — Progress Notes (Signed)
[place] [address] [facility] Phone: [phone] Subjective:   I Crystal [name] am serving as a Education administrator for Dr. Hulan Saas.  This visit occurred during the SARS-CoV-2 public health emergency.  Safety protocols were in place, including screening questions prior to the visit, additional usage of staff PPE, and extensive cleaning of exam room while observing appropriate contact time as indicated for disinfecting solutions.   I'm seeing this patient by the request  of:  Biagio Borg, MD  CC: Bilateral knee pain  OHF:[id number]   12/20/2020 Right-sided viscosupplementation given today.  Tolerated the procedure well.  Does have known arthritic changes and likely this is contributing.  Patient did have a hip replacement on the left side and was likely compensating for quite some time.  Discussed home exercises and icing regimen.  Encouraged her to continue to work on the weight loss which patient has done remarkably well at this time.  Follow-up with me again 2 to 3 months  Update 02/22/2021 Crystal [name] is a 70 y.o. female coming in with complaint of posterior L knee pain with walking. 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 Also wants more information on PRP. Right knee still pops and grinds. Going to PT for the hip and states the knee pops.  Patient states that it is somewhat tender.  Starting to have more increasing discomfort more on the posterior aspect of the knees.  Rates the severity of pain is 5 out of 10.  Does not feel the viscosupplementation made significant progress    Past Medical History:  Diagnosis Date  . ALLERGIC RHINITIS   . Allergy   . ANXIETY    no medations needed  . Arthritis   . Asthma   . Bronchitis   . Cataract    no surgery  . COLONIC POLYPS, HX OF   . Complication of anesthesia    oxygen sat dropped during endoscopy   . COPD    pt  unsure of this dx  . Cough   . DEPRESSION   . Fatty liver   . GERD   . GLUCOSE INTOLERANCE   . HYPERTENSION   . IBS   . Impaired glucose tolerance   . Obstructive sleep apnea 06/25/2018  . OSTEOPENIA   . Osteopenia   . Osteoporosis 07/30/2017  . PERIPHERAL EDEMA   . Pneumonia   . Pneumonia, organism unspecified(486)   . Sleep apnea    Oral Aparatus  . TOBACCO USE DISORDER/SMOKER-SMOKING CESSATION DISCUSSED    Past Surgical History:  Procedure Laterality Date  . birthmark removed     from back as a child  . bladder mesh     2012  . CESAREAN SECTION     x2  . CHOLECYSTECTOMY    . COLONOSCOPY  2018   TA  . MOUTH SURGERY  04/25/2020   tissue graft -room of mouth to front gums  . 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 WISDOM TOOTH EXTRACTION     Social History   Socioeconomic History  . Marital status: Widowed    Spouse name: Not on file  . Number of children: 2  . Years of education: Not on file  . Highest education level: Not on file  Occupational History  . Occupation: Occupational psychologist industries, Museum/gallery curator for [employer]  Tobacco Use  . Smoking status: Current Every Day Smoker    Packs/day: 1.00    Years: 42.00    Pack years: 42.00    Types: Cigarettes    Start date: 81  . Smokeless tobacco: Never Used  Vaping Use  . Vaping Use: Never used  Substance and Sexual Activity  . Alcohol use: Yes    Alcohol/week: 21.0 standard drinks    Types: 7 Glasses of wine, 14 Standard drinks or equivalent per week    Comment: couple of drinks 1-2 nites per week   . Drug use: No  . Sexual activity: Not on file  Other Topics Concern  . Not on file  Social History Narrative  . Not on file   Social Determinants of Health   Financial Resource Strain: Not on file  Food Insecurity: Not on file  Transportation  Needs: Not on file  Physical Activity: Not on file  Stress: Not on file  Social Connections: Not on file   Allergies  Allergen Reactions  . Ace Inhibitors Cough  . Penicillins Rash   Family History  Problem Relation Age of Onset  . Breast cancer Sister 40       +lump and tamoxifen; eventually had bilateral mastectomies  . Allergic rhinitis Sister   . Colon cancer Father        dx. >65  . Bladder Cancer Father        dx. 50-79; not a smoker  . Skin cancer Father        basal cell carcinoma  . Colon polyps Father   . Colon cancer Paternal Grandfather        unspecified age  . Melanoma Paternal Grandfather        unspecified age  . Skin cancer Mother        NOS; dx. 29s  . Other Mother        hx of hysterectomy in her early 27s for fibroid cysts  . Heart attack Maternal Uncle 67  . Heart attack Maternal Grandmother 62  . Thyroid cancer Paternal Grandmother 88  . Breast cancer Sister        dx. early 58s; s/p BL mastectomies  . Cervical cancer Sister        dx. late 50s  . Skin cancer Brother        NOS  . Anxiety disorder Other   . Hypothyroidism Other   . Hyperlipidemia Other   . Diabetes Other   . Esophageal cancer Neg Hx   . Stomach cancer Neg Hx   . Rectal cancer Neg Hx     Current Outpatient Medications (Endocrine & Metabolic):  .  denosumab (PROLIA) 60 MG/ML SOSY injection, Inject 60 mg into the skin every 6 (six) months.  Current Outpatient Medications (Cardiovascular):  .  losartan (COZAAR) 50 MG tablet, Take 1 tablet (50 mg total) by mouth daily. Annual appt due in May must see provider for future refills .  metoprolol succinate (TOPROL-XL) 25 MG 24 hr tablet, Take 1 tablet (25 mg total) by mouth daily.  Current Outpatient Medications (Respiratory):  .  albuterol (VENTOLIN HFA) 108 (90 Base) MCG/ACT inhaler, Inhale 1-2 puffs into the lungs every 4 (four) hours as needed for wheezing or shortness of breath (coughing). .  Fexofenadine HCl (MUCINEX ALLERGY  PO), Take 600 mg by mouth as needed. .  fluticasone (FLONASE) 50 MCG/ACT nasal spray, Place 2  sprays into both nostrils daily as needed for allergies or rhinitis.  Current Outpatient Medications (Analgesics):  .  aspirin EC 81 MG tablet, Take 1 tablet (81 mg total) by mouth 2 (two) times daily after a meal. Swallow whole. Marland Kitchen  HYDROcodone-acetaminophen (NORCO/VICODIN) 5-325 MG tablet, Take 1-2 tablets by mouth every 6 (six) hours as needed for moderate pain.  Current Outpatient Medications (Hematological):  .  folic acid (FOLVITE) 883 MCG tablet, Take 400 mcg by mouth daily. .  vitamin B-12 (CYANOCOBALAMIN) 1000 MCG tablet, Take 1 tablet (1,000 mcg total) by mouth daily.  Current Outpatient Medications (Other):  Marland Kitchen  Calcium Carb-Cholecalciferol 600-800 MG-UNIT TABS, Take 1 tablet by mouth in the morning and at bedtime.  .  Cholecalciferol (VITAMIN D PO), Take 2,000 Units by mouth daily.  Marland Kitchen  tiZANidine (ZANAFLEX) 4 MG tablet, Take 1 tablet (4 mg total) by mouth every 6 (six) hours as needed for muscle spasms.   Reviewed prior external information including notes and imaging from  primary care provider As well as notes that were available from care everywhere and other healthcare systems.  Past medical history, social, surgical and family history all reviewed in electronic medical record.  No pertanent information unless stated regarding to the chief complaint.   Review of Systems:  No headache, visual changes, nausea, vomiting, diarrhea, constipation, dizziness, abdominal pain, skin rash, fevers, chills, night sweats, weight loss, swollen lymph nodes, body aches, joint swelling, chest pain, shortness of breath, mood changes. POSITIVE muscle aches  Objective  Blood pressure 130/82, pulse 83, height 5' 3"  (1.6 m), weight 185 lb (83.9 kg), SpO2 100 %.   General: No apparent distress alert and oriented x3 mood and affect normal, dressed appropriately.  HEENT: Pupils equal, extraocular movements  intact  Respiratory: Patient's speak in full sentences and does not appear short of breath  Cardiovascular: No lower extremity edema, non tender, no erythema  Gait antalgic gait Knee: Bilateral valgus deformity noted. Large thigh to calf ratio.  Effusion of the patellofemoral joint bilaterally Tender to palpation over medial and PF joint line.  ROM lacks last 5 degrees of flexion bilaterally instability with valgus force.  painful patellar compression. Patellar glide with moderate crepitus. Patellar and quadriceps tendons unremarkable. Hamstring and quadriceps strength is normal.   Limited musculoskeletal ultrasound was performed and interpreted by Lyndal Pulley  Limited ultrasound of the patient's knees bilaterally show the patient does have increasing effusion of the patellofemoral joint again.  Moderate to severe arthritic changes noted.  Patient does have some mild crystal deposits noted right knee more than the left.  Patient does have a Baker's cyst noted on the right knee as well. Impression: Recurrent knee effusions with severe arthritis   Impression and Recommendations:     The above documentation has been reviewed and is accurate and complete Lyndal Pulley, DO

## 2021-02-22 ENCOUNTER — Other Ambulatory Visit: Payer: Self-pay

## 2021-02-22 ENCOUNTER — Ambulatory Visit: Payer: Medicare HMO | Admitting: Family Medicine

## 2021-02-22 ENCOUNTER — Encounter: Payer: Self-pay | Admitting: Family Medicine

## 2021-02-22 ENCOUNTER — Ambulatory Visit: Payer: Self-pay

## 2021-02-22 VITALS — BP 130/82 | HR 83 | Ht 63.0 in | Wt 185.0 lb

## 2021-02-22 DIAGNOSIS — M170 Bilateral primary osteoarthritis of knee: Secondary | ICD-10-CM

## 2021-02-22 DIAGNOSIS — G8929 Other chronic pain: Secondary | ICD-10-CM

## 2021-02-22 DIAGNOSIS — M25561 Pain in right knee: Secondary | ICD-10-CM

## 2021-02-22 NOTE — Progress Notes (Signed)
[place] Phone: [phone] Subjective:   Crystal [name], am serving as a scribe for Dr. Hulan Saas.This visit occurred during the SARS-CoV-2 public health emergency.  Safety protocols were in place, including screening questions prior to the visit, additional usage of staff PPE, and extensive cleaning of exam room while observing appropriate contact time as indicated for disinfecting solutions.   I'm seeing this patient by the request  of:  Biagio Borg, MD  CC: Knee pain follow-up  AJO:[id number]   02/22/2021 Patient did not respond well to the viscosupplementation.  Effusions noted of both knees today.  Discussed with patient about different treatment options.  Patient has elected to try the PRP and will come back on Monday.  At that time we will do the PRP and then start the post PRP instructions.  Patient will hold on physical therapy as well as her water aerobics for 72 hours and then start increasing again.  Depending on how patient responds I would like to see patient again before she leaves at the end of June so before her trip we can consider a possible corticosteroid injection.  Patient is in agreement with the plan.  Reading materials given for the PRP.  Follow-up with me again Monday  Update 02/25/2021 Crystal [name] is a 70 y.o. female coming in with complaint of B knee pain.  Has failed conservative therapy at this point.  Patient is here for PRP injections.    Past Medical History:  Diagnosis Date  . ALLERGIC RHINITIS   . Allergy   . ANXIETY    [name] medations needed  . Arthritis   . Asthma   . Bronchitis   . Cataract    [name] surgery  . COLONIC POLYPS, HX OF   . Complication of anesthesia    oxygen sat dropped during endoscopy   . COPD    pt unsure of this dx  . Cough   . DEPRESSION   . Fatty liver   . GERD   . GLUCOSE INTOLERANCE   . HYPERTENSION   . IBS   . Impaired glucose tolerance    . Obstructive sleep apnea 06/25/2018  . OSTEOPENIA   . Osteopenia   . Osteoporosis 07/30/2017  . PERIPHERAL EDEMA   . Pneumonia   . Pneumonia, organism unspecified(486)   . Sleep apnea    Oral Aparatus  . TOBACCO USE DISORDER/SMOKER-SMOKING CESSATION DISCUSSED    Past Surgical History:  Procedure Laterality Date  . birthmark removed     from back as a child  . bladder mesh     2012  . CESAREAN SECTION     x2  . CHOLECYSTECTOMY    . COLONOSCOPY  2018   TA  . MOUTH SURGERY  04/25/2020   tissue graft -room of mouth to front gums  . 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 WISDOM TOOTH EXTRACTION     Social History   Socioeconomic History  . Marital status: Widowed    Spouse name: Not on file  . Number of children: 2  . Years of education: Not on file  . Highest education level: Not on file  Occupational History  . Occupation: Occupational psychologist industries, Museum/gallery curator for [employer]  Tobacco Use  . Smoking status: Current Every Day Smoker  Packs/day: 1.00    Years: 42.00    Pack years: 42.00    Types: Cigarettes    Start date: 82  . Smokeless tobacco: Never Used  Vaping Use  . Vaping Use: Never used  Substance and Sexual Activity  . Alcohol use: Yes    Alcohol/week: 21.0 standard drinks    Types: 7 Glasses of wine, 14 Standard drinks or equivalent per week    Comment: couple of drinks 1-2 nites per week   . Drug use: [name]  . Sexual activity: Not on file  Other Topics Concern  . Not on file  Social History Narrative  . Not on file   Social Determinants of Health   Financial Resource Strain: Not on file  Food Insecurity: Not on file  Transportation Needs: Not on file  Physical Activity: Not on file  Stress: Not on file  Social Connections: Not on file   Allergies  Allergen Reactions  . Ace  Inhibitors Cough  . Penicillins Rash   Family History  Problem Relation Age of Onset  . Breast cancer Sister 40       +lump and tamoxifen; eventually had bilateral mastectomies  . Allergic rhinitis Sister   . Colon cancer Father        dx. >56  . Bladder Cancer Father        dx. 55-79; not a smoker  . Skin cancer Father        basal cell carcinoma  . Colon polyps Father   . Colon cancer Paternal Grandfather        unspecified age  . Melanoma Paternal Grandfather        unspecified age  . Skin cancer Mother        NOS; dx. 78s  . Other Mother        hx of hysterectomy in her early 69s for fibroid cysts  . Heart attack Maternal Uncle 67  . Heart attack Maternal Grandmother 62  . Thyroid cancer Paternal Grandmother 88  . Breast cancer Sister        dx. early 4s; s/p BL mastectomies  . Cervical cancer Sister        dx. late 7s  . Skin cancer Brother        NOS  . Anxiety disorder Other   . Hypothyroidism Other   . Hyperlipidemia Other   . Diabetes Other   . Esophageal cancer Neg Hx   . Stomach cancer Neg Hx   . Rectal cancer Neg Hx     Current Outpatient Medications (Endocrine & Metabolic):  .  denosumab (PROLIA) 60 MG/ML SOSY injection, Inject 60 mg into the skin every 6 (six) months.  Current Outpatient Medications (Cardiovascular):  .  losartan (COZAAR) 50 MG tablet, Take 1 tablet (50 mg total) by mouth daily. Annual appt due in May must see provider for future refills .  metoprolol succinate (TOPROL-XL) 25 MG 24 hr tablet, Take 1 tablet (25 mg total) by mouth daily.  Current Outpatient Medications (Respiratory):  .  albuterol (VENTOLIN HFA) 108 (90 Base) MCG/ACT inhaler, Inhale 1-2 puffs into the lungs every 4 (four) hours as needed for wheezing or shortness of breath (coughing). .  Fexofenadine HCl (MUCINEX ALLERGY PO), Take 600 mg by mouth as needed. .  fluticasone (FLONASE) 50 MCG/ACT nasal spray, Place 2 sprays into both nostrils daily as needed for allergies  or rhinitis.  Current Outpatient Medications (Analgesics):  .  aspirin EC 81 MG tablet, Take 1 tablet (  81 mg total) by mouth 2 (two) times daily after a meal. Swallow whole. Marland Kitchen  HYDROcodone-acetaminophen (NORCO/VICODIN) 5-325 MG tablet, Take 1-2 tablets by mouth every 6 (six) hours as needed for moderate pain.  Current Outpatient Medications (Hematological):  .  folic acid (FOLVITE) 794 MCG tablet, Take 400 mcg by mouth daily. .  vitamin B-12 (CYANOCOBALAMIN) 1000 MCG tablet, Take 1 tablet (1,000 mcg total) by mouth daily.  Current Outpatient Medications (Other):  Marland Kitchen  Calcium Carb-Cholecalciferol 600-800 MG-UNIT TABS, Take 1 tablet by mouth in the morning and at bedtime.  .  Cholecalciferol (VITAMIN D PO), Take 2,000 Units by mouth daily.  Marland Kitchen  tiZANidine (ZANAFLEX) 4 MG tablet, Take 1 tablet (4 mg total) by mouth every 6 (six) hours as needed for muscle spasms.   Reviewed prior external information including notes and imaging from  primary care provider As well as notes that were available from care everywhere and other healthcare systems.  Past medical history, social, surgical and family history all reviewed in electronic medical record.  [name] pertanent information unless stated regarding to the chief complaint.   Review of Systems:  [name] headache, visual changes, nausea, vomiting, diarrhea, constipation, dizziness, abdominal pain, skin rash, fevers, chills, night sweats, weight loss, swollen lymph nodes, body aches, joint swelling, chest pain, shortness of breath, mood changes. POSITIVE muscle aches  Objective  Blood pressure 116/90, pulse 94, height 5' 3"  (1.6 m), weight 185 lb (83.9 kg), SpO2 98 %.   General: [name] apparent distress alert and oriented x3 mood and affect normal, dressed appropriately.  Mild benign head tremor noted. HEENT: Pupils equal, extraocular movements intact  Respiratory: Patient's speak in full sentences and does not appear short of breath  Cardiovascular: [name] lower  extremity edema, non tender, [name] erythema  Gait normal with good balance and coordination.  MSK: Knee: Bilateral valgus deformity noted. Large thigh to calf ratio.  Tender to palpation over medial and PF joint line.  ROM limited range of motion noted of extension of the knees bilaterally instability with valgus force.  painful patellar compression. Patellar glide with moderate crepitus. Patellar and quadriceps tendons unremarkable. Hamstring and quadriceps strength is normal.  After informed written and verbal consent, patient was seated on exam table. Right knee was prepped with alcohol swab and utilizing anterolateral approach, patient's right knee space was injected with 2 cc of 0.5% Marcaine with a 21-gauge 2 inch needle then injected 3 cc of 3 centrifuge PRP.  [name] blood loss.  Band-Aid placed.  Postinjection instructions given  After informed written and verbal consent, patient was seated on exam table. Left knee was prepped with alcohol swab and utilizing anterolateral approach, patient's left knee space was injected with 2 cc of 0.5% Marcaine and then 3 cc of 3 centrifuge PRP with a 21-gauge 2 inch needle into the left knee.  [name] blood loss.  Band-Aid placed.  Postinjection instructions given   Impression and Recommendations:     The above documentation has been reviewed and is accurate and complete Lyndal Pulley, DO

## 2021-02-22 NOTE — Assessment & Plan Note (Signed)
Patient did not respond well to the viscosupplementation.  Effusions noted of both knees today.  Discussed with patient about different treatment options.  Patient has elected to try the PRP and will come back on Monday.  At that time we will do the PRP and then start the post PRP instructions.  Patient will hold on physical therapy as well as her water aerobics for 72 hours and then start increasing again.  Depending on how patient responds I would like to see patient again before she leaves at the end of June so before her trip we can consider a possible corticosteroid injection.  Patient is in agreement with the plan.  Reading materials given for the PRP.  Follow-up with me again Monday

## 2021-02-22 NOTE — Patient Instructions (Signed)
Good to see you PRP Monday Will drain you at the same time

## 2021-02-25 ENCOUNTER — Ambulatory Visit: Payer: Self-pay | Admitting: Family Medicine

## 2021-02-25 ENCOUNTER — Other Ambulatory Visit: Payer: Self-pay

## 2021-02-25 ENCOUNTER — Encounter: Payer: Self-pay | Admitting: Family Medicine

## 2021-02-25 DIAGNOSIS — M170 Bilateral primary osteoarthritis of knee: Secondary | ICD-10-CM

## 2021-02-25 NOTE — Patient Instructions (Signed)
No ice or IBU for 3 days Heat is ok See me in 6 weeks

## 2021-02-25 NOTE — Assessment & Plan Note (Signed)
PRP given today.  Post PRP instructions given with athletic trainer.  Patient will slowly increase activity over the course of time.  Warned of potential side effects.  Follow-up with me again in 6 weeks

## 2021-03-11 ENCOUNTER — Other Ambulatory Visit (INDEPENDENT_AMBULATORY_CARE_PROVIDER_SITE_OTHER): Payer: Medicare HMO

## 2021-03-11 DIAGNOSIS — E538 Deficiency of other specified B group vitamins: Secondary | ICD-10-CM | POA: Diagnosis not present

## 2021-03-11 DIAGNOSIS — Z0000 Encounter for general adult medical examination without abnormal findings: Secondary | ICD-10-CM | POA: Diagnosis not present

## 2021-03-11 DIAGNOSIS — R7302 Impaired glucose tolerance (oral): Secondary | ICD-10-CM | POA: Diagnosis not present

## 2021-03-11 LAB — LIPID PANEL
Cholesterol: 195 mg/dL (ref 0–200)
HDL: 82.7 mg/dL (ref >39.00)
LDL Cholesterol: 100 mg/dL — ABNORMAL HIGH (ref 0–99)
NonHDL: 112.01
Total CHOL/HDL Ratio: 2
Triglycerides: 61 mg/dL (ref 0.0–149.0)
VLDL: 12.2 mg/dL (ref 0.0–40.0)

## 2021-03-11 LAB — URINALYSIS, ROUTINE W REFLEX MICROSCOPIC
Bilirubin Urine: NEGATIVE
Ketones, ur: NEGATIVE
Leukocytes,Ua: NEGATIVE
Nitrite: NEGATIVE
Specific Gravity, Urine: 1.025 (ref 1.000–1.030)
Total Protein, Urine: NEGATIVE
Urine Glucose: NEGATIVE
Urobilinogen, UA: 0.2 (ref 0.0–1.0)
WBC, UA: NONE SEEN (ref 0–?)
pH: 5.5 (ref 5.0–8.0)

## 2021-03-11 LAB — CBC WITH DIFFERENTIAL/PLATELET
Basophils Absolute: 0 10*3/uL (ref 0.0–0.1)
Basophils Relative: 0.7 % (ref 0.0–3.0)
Eosinophils Absolute: 0.2 10*3/uL (ref 0.0–0.7)
Eosinophils Relative: 3 % (ref 0.0–5.0)
HCT: 40.9 % (ref 36.0–46.0)
Hemoglobin: 14 g/dL (ref 12.0–15.0)
Lymphocytes Relative: 30.6 % (ref 12.0–46.0)
Lymphs Abs: 2 10*3/uL (ref 0.7–4.0)
MCHC: 34.3 g/dL (ref 30.0–36.0)
MCV: 94.3 fl (ref 78.0–100.0)
Monocytes Absolute: 0.5 10*3/uL (ref 0.1–1.0)
Monocytes Relative: 8.4 % (ref 3.0–12.0)
Neutro Abs: 3.7 10*3/uL (ref 1.4–7.7)
Neutrophils Relative %: 57.3 % (ref 43.0–77.0)
Platelets: 273 10*3/uL (ref 150.0–400.0)
RBC: 4.34 Mil/uL (ref 3.87–5.11)
RDW: 13.8 % (ref 11.5–15.5)
WBC: 6.5 10*3/uL (ref 4.0–10.5)

## 2021-03-11 LAB — BASIC METABOLIC PANEL
BUN: 16 mg/dL (ref 6–23)
CO2: 26 mEq/L (ref 19–32)
Calcium: 9.2 mg/dL (ref 8.4–10.5)
Chloride: 106 mEq/L (ref 96–112)
Creatinine, Ser: 0.65 mg/dL (ref 0.40–1.20)
GFR: 88.91 mL/min (ref >60.00)
Glucose, Bld: 94 mg/dL (ref 70–99)
Potassium: 4.4 mEq/L (ref 3.5–5.1)
Sodium: 141 mEq/L (ref 135–145)

## 2021-03-11 LAB — HEPATIC FUNCTION PANEL
ALT: 10 U/L (ref 0–35)
AST: 15 U/L (ref 0–37)
Albumin: 4 g/dL (ref 3.5–5.2)
Alkaline Phosphatase: 52 U/L (ref 39–117)
Bilirubin, Direct: 0.1 mg/dL (ref 0.0–0.3)
Total Bilirubin: 0.7 mg/dL (ref 0.2–1.2)
Total Protein: 7.1 g/dL (ref 6.0–8.3)

## 2021-03-11 LAB — HEMOGLOBIN A1C: Hgb A1c MFr Bld: 5.5 % (ref 4.6–6.5)

## 2021-03-11 LAB — VITAMIN B12: Vitamin B-12: 1012 pg/mL — ABNORMAL HIGH (ref 211–911)

## 2021-03-11 LAB — TSH: TSH: 1.1 u[IU]/mL (ref 0.35–4.50)

## 2021-03-12 ENCOUNTER — Other Ambulatory Visit: Payer: Self-pay

## 2021-03-12 ENCOUNTER — Ambulatory Visit: Payer: Medicare HMO | Admitting: Family Medicine

## 2021-03-13 ENCOUNTER — Ambulatory Visit (INDEPENDENT_AMBULATORY_CARE_PROVIDER_SITE_OTHER): Payer: Medicare HMO | Admitting: Internal Medicine

## 2021-03-13 ENCOUNTER — Encounter: Payer: Self-pay | Admitting: Internal Medicine

## 2021-03-13 VITALS — BP 122/70 | HR 81 | Temp 98.2°F | Ht 63.0 in | Wt 186.0 lb

## 2021-03-13 DIAGNOSIS — I10 Essential (primary) hypertension: Secondary | ICD-10-CM

## 2021-03-13 DIAGNOSIS — R109 Unspecified abdominal pain: Secondary | ICD-10-CM

## 2021-03-13 DIAGNOSIS — E538 Deficiency of other specified B group vitamins: Secondary | ICD-10-CM | POA: Diagnosis not present

## 2021-03-13 DIAGNOSIS — Z0001 Encounter for general adult medical examination with abnormal findings: Secondary | ICD-10-CM | POA: Diagnosis not present

## 2021-03-13 DIAGNOSIS — E559 Vitamin D deficiency, unspecified: Secondary | ICD-10-CM

## 2021-03-13 DIAGNOSIS — R7302 Impaired glucose tolerance (oral): Secondary | ICD-10-CM

## 2021-03-13 DIAGNOSIS — J449 Chronic obstructive pulmonary disease, unspecified: Secondary | ICD-10-CM | POA: Diagnosis not present

## 2021-03-13 DIAGNOSIS — Z471 Aftercare following joint replacement surgery: Secondary | ICD-10-CM | POA: Diagnosis not present

## 2021-03-13 DIAGNOSIS — J3089 Other allergic rhinitis: Secondary | ICD-10-CM | POA: Diagnosis not present

## 2021-03-13 DIAGNOSIS — F17200 Nicotine dependence, unspecified, uncomplicated: Secondary | ICD-10-CM

## 2021-03-13 NOTE — Progress Notes (Signed)
Patient ID: Crystal [name], female   DOB: 1951-04-23, 70 y.o.   MRN: [id number]         Chief Complaint:: wellness exam and        HPI:  Crystal [name] is a 70 y.o. female here for wellness exam; Also plans for f/u eye appt in aug 2022, also for f/u OSA appt with oral appliance/dental.  Has not see pulm sinc 2019 dr young.  Declines shingrix; o/w up to date with preventive referrals and immunizations.    Also Does have several wks ongoing nasal allergy symptoms with clearish congestion, itch and sneezing, without fever, pain, ST, cough, swelling or wheezing.  Has been a long time smoker, needs enrolled in LDCT yearly screening with pulmonary.  Also had a 2 days episode in mar 2022 of right flank pain associated with n/v and urinary frequency suddenly resolved without recurence, wondering if could have been a renal stone, asks for urology referral.  Pt denies chest pain, increased sob or doe, wheezing, orthopnea, PND, increased LE swelling, palpitations, dizziness or syncope.   Pt denies polydipsia, polyuria, or new focal neuro s/s.   Pt denies fever, wt loss, night sweats, loss of appetite, or other constitutional symptoms    Wt Readings from Last 3 Encounters:  03/13/21 186 lb (84.4 kg)  02/25/21 185 lb (83.9 kg)  02/22/21 185 lb (83.9 kg)   BP Readings from Last 3 Encounters:  03/13/21 122/70  02/25/21 116/90  02/22/21 130/82   Immunization History  Administered Date(s) Administered  . Hepatitis B, adult 01/23/2017, 02/23/2017  . Hepatitis B, ped/adol 07/27/2017  . PFIZER(Purple Top)SARS-COV-2 Vaccination 12/13/2019, 12/27/2019  . Pneumococcal Conjugate-13 03/05/2016  . Pneumococcal Polysaccharide-23 03/11/2017  . Td 12/21/2006  . Tdap 02/17/2013   There are no preventive care reminders to display for this patient.    Past Medical History:  Diagnosis Date  . ALLERGIC RHINITIS   . Allergy   . ANXIETY    no medations needed  . Arthritis   . Asthma   . Bronchitis   .  Cataract    no surgery  . COLONIC POLYPS, HX OF   . Complication of anesthesia    oxygen sat dropped during endoscopy   . COPD    pt unsure of this dx  . Cough   . DEPRESSION   . Fatty liver   . GERD   . GLUCOSE INTOLERANCE   . HYPERTENSION   . IBS   . Impaired glucose tolerance   . Obstructive sleep apnea 06/25/2018  . OSTEOPENIA   . Osteopenia   . Osteoporosis 07/30/2017  . PERIPHERAL EDEMA   . Pneumonia   . Pneumonia, organism unspecified(486)   . Sleep apnea    Oral Aparatus  . TOBACCO USE DISORDER/SMOKER-SMOKING CESSATION DISCUSSED    Past Surgical History:  Procedure Laterality Date  . birthmark removed     from back as a child  . bladder mesh     2012  . CESAREAN SECTION     x2  . CHOLECYSTECTOMY    . COLONOSCOPY  2018   TA  . MOUTH SURGERY  04/25/2020   tissue graft -room of mouth to front gums  . 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 WISDOM TOOTH EXTRACTION      reports that she has been smoking cigarettes. She started smoking  about 43 years ago. She has a 42.00 pack-year smoking history. She has never used smokeless tobacco. She reports current alcohol use of about 21.0 standard drinks of alcohol per week. She reports that she does not use drugs. family history includes Allergic rhinitis in her sister; Anxiety disorder in an other family member; Bladder Cancer in her father; Breast cancer in her sister; Breast cancer (age of onset: 37) in her sister; Cervical cancer in her sister; Colon cancer in her father and paternal grandfather; Colon polyps in her father; Diabetes in an other family member; Heart attack (age of onset: 9) in her maternal grandmother; Heart attack (age of onset: 15) in her maternal uncle; Hyperlipidemia in an other family member; Hypothyroidism in an other family member; Melanoma in her  paternal grandfather; Other in her mother; Skin cancer in her brother, father, and mother; Thyroid cancer (age of onset: 53) in her paternal grandmother. Allergies  Allergen Reactions  . Ace Inhibitors Cough  . Penicillins Rash   Current Outpatient Medications on File Prior to Visit  Medication Sig Dispense Refill  . albuterol (VENTOLIN HFA) 108 (90 Base) MCG/ACT inhaler Inhale 1-2 puffs into the lungs every 4 (four) hours as needed for wheezing or shortness of breath (coughing). 18 g 1  . aspirin EC 81 MG tablet Take 1 tablet (81 mg total) by mouth 2 (two) times daily after a meal. Swallow whole. 60 tablet 0  . Calcium Carb-Cholecalciferol 600-800 MG-UNIT TABS Take 1 tablet by mouth in the morning and at bedtime.     . Cholecalciferol (VITAMIN D PO) Take 2,000 Units by mouth daily.     Marland Kitchen denosumab (PROLIA) 60 MG/ML SOSY injection Inject 60 mg into the skin every 6 (six) months.    . fluticasone (FLONASE) 50 MCG/ACT nasal spray Place 2 sprays into both nostrils daily as needed for allergies or rhinitis.    . folic acid (FOLVITE) 549 MCG tablet Take 400 mcg by mouth daily.    Marland Kitchen HYDROcodone-acetaminophen (NORCO/VICODIN) 5-325 MG tablet Take 1-2 tablets by mouth every 6 (six) hours as needed for moderate pain. 40 tablet 0  . losartan (COZAAR) 50 MG tablet Take 1 tablet (50 mg total) by mouth daily. Annual appt due in May must see provider for future refills 90 tablet 0  . metoprolol succinate (TOPROL-XL) 25 MG 24 hr tablet Take 1 tablet (25 mg total) by mouth daily. 90 tablet 3  . tiZANidine (ZANAFLEX) 4 MG tablet Take 1 tablet (4 mg total) by mouth every 6 (six) hours as needed for muscle spasms. 40 tablet 1  . vitamin B-12 (CYANOCOBALAMIN) 1000 MCG tablet Take 1 tablet (1,000 mcg total) by mouth daily. 90 tablet 3   No current facility-administered medications on file prior to visit.        ROS:  All others reviewed and negative.  Objective        PE:  BP 122/70 (BP Location: Right Arm,  Patient Position: Sitting, Cuff Size: Normal)   Pulse 81   Temp 98.2 F (36.8 C) (Oral)   Ht 5' 3"  (1.6 m)   Wt 186 lb (84.4 kg)   SpO2 99%   BMI 32.95 kg/m                 Constitutional: Pt appears in NAD               HENT: Head: NCAT.                Right  Ear: External ear normal.                 Left Ear: External ear normal.                Eyes: . Pupils are equal, round, and reactive to light. Conjunctivae and EOM are normal               Nose: without d/c or deformity               Neck: Neck supple. Gross normal ROM               Cardiovascular: Normal rate and regular rhythm.                 Pulmonary/Chest: Effort normal and breath sounds without rales or wheezing.                Abd:  Soft, NT, ND, + BS, no organomegaly               Neurological: Pt is alert. At baseline orientation, motor grossly intact               Skin: Skin is warm. No rashes, no other new lesions, LE edema - none               Psychiatric: Pt behavior is normal without agitation   Micro: none  Cardiac tracings I have personally interpreted today:  none  Pertinent Radiological findings (summarize): none   Lab Results  Component Value Date   WBC 6.5 03/11/2021   HGB 14.0 03/11/2021   HCT 40.9 03/11/2021   PLT 273.0 03/11/2021   GLUCOSE 94 03/11/2021   CHOL 195 03/11/2021   TRIG 61.0 03/11/2021   HDL 82.70 03/11/2021   LDLDIRECT 82.2 07/01/2013   LDLCALC 100 (H) 03/11/2021   ALT 10 03/11/2021   AST 15 03/11/2021   NA 141 03/11/2021   K 4.4 03/11/2021   CL 106 03/11/2021   CREATININE 0.65 03/11/2021   BUN 16 03/11/2021   CO2 26 03/11/2021   TSH 1.10 03/11/2021   INR 1.0 08/31/2020   HGBA1C 5.5 03/11/2021   Assessment/Plan:  Crystal [name] is a 70 y.o. White or Caucasian [1] female with  has a past medical history of ALLERGIC RHINITIS, Allergy, ANXIETY, Arthritis, Asthma, Bronchitis, Cataract, COLONIC POLYPS, HX OF, Complication of anesthesia, COPD, Cough, DEPRESSION, Fatty  liver, GERD, GLUCOSE INTOLERANCE, HYPERTENSION, IBS, Impaired glucose tolerance, Obstructive sleep apnea (06/25/2018), OSTEOPENIA, Osteopenia, Osteoporosis (07/30/2017), PERIPHERAL EDEMA, Pneumonia, Pneumonia, organism unspecified(486), Sleep apnea, and TOBACCO USE DISORDER/SMOKER-SMOKING CESSATION DISCUSSED.  Encounter for well adult exam with abnormal findings Age and sex appropriate education and counseling updated with regular exercise and diet Referrals for preventative services - pt to call for eye exam in aug 2022 Immunizations addressed - declines shignrix Smoking counseling  - counsled to quit, pt not ready Evidence for depression or other mood disorder - none significant Most recent labs reviewed. I have personally reviewed and have noted: 1) the patient's medical and social history 2) The patient's current medications and supplements 3) The patient's height, weight, and BMI have been recorded in the chart   TOBACCO USE DISORDER/SMOKER-SMOKING CESSATION DISCUSSED Counseled to quit, pt for referral to pulm for LDCT program  Essential hypertension BP Readings from Last 3 Encounters:  03/13/21 122/70  02/25/21 116/90  02/22/21 130/82   Stable, pt to continue medical treatment losartan, toprol   Other allergic rhinitis Pt requests referral Dr  young  Impaired glucose tolerance Lab Results  Component Value Date   HGBA1C 5.5 03/11/2021   Stable, pt to continue current medical treatment  - done   Right flank pain High suspicion for renal stone, ok for urology referral  B12 deficiency Lab Results  Component Value Date   VITAMINB12 1,012 (H) 03/11/2021   Stable, cont oral replacement - b12 1000 mcg qd   Vitamin D deficiency Last vitamin D Lab Results  Component Value Date   VD25OH 73.86 02/22/2020   Stable, cont oral replacement   Followup: Return in about 1 year (around 03/13/2022).  Cathlean Cower, MD 03/17/2021 7:31 PM [facility]

## 2021-03-13 NOTE — Patient Instructions (Signed)
Please continue all other medications as before, and refills have been done if requested.  Please have the pharmacy call with any other refills you may need.  Please continue your efforts at being more active, low cholesterol diet, and weight control.  You are otherwise up to date with prevention measures today.  Please keep your appointments with your specialists as you may have planned  You will be contacted regarding the referral for: Dr Annamaria Boots and Dr Vikki Ports after aug 2022  Please make an Appointment to return for your 1 year visit, or sooner if needed, with Lab testing by Appointment as well, to be done about 3-5 days before at the [place] (so this is for TWO appointments - please see the scheduling desk as you leave)  Due to the ongoing Covid 19 pandemic, our lab now requires an appointment for any labs done at our office.  If you need labs done and do not have an appointment, please call our office ahead of time to schedule before presenting to the lab for your testing.

## 2021-03-17 ENCOUNTER — Encounter: Payer: Self-pay | Admitting: Internal Medicine

## 2021-03-17 NOTE — Assessment & Plan Note (Signed)
Pt requests referral Dr young

## 2021-03-17 NOTE — Addendum Note (Signed)
Addended by: Biagio Borg on: 03/17/2021 07:35 PM   Modules accepted: Orders

## 2021-03-17 NOTE — Assessment & Plan Note (Signed)
Age and sex appropriate education and counseling updated with regular exercise and diet Referrals for preventative services - pt to call for eye exam in aug 2022 Immunizations addressed - declines shignrix Smoking counseling  - counsled to quit, pt not ready Evidence for depression or other mood disorder - none significant Most recent labs reviewed. I have personally reviewed and have noted: 1) the patient's medical and social history 2) The patient's current medications and supplements 3) The patient's height, weight, and BMI have been recorded in the chart

## 2021-03-17 NOTE — Assessment & Plan Note (Signed)
BP Readings from Last 3 Encounters:  03/13/21 122/70  02/25/21 116/90  02/22/21 130/82   Stable, pt to continue medical treatment losartan, toprol

## 2021-03-17 NOTE — Assessment & Plan Note (Signed)
High suspicion for renal stone, ok for urology referral

## 2021-03-17 NOTE — Assessment & Plan Note (Signed)
Lab Results  Component Value Date   HGBA1C 5.5 03/11/2021   Stable, pt to continue current medical treatment  - done

## 2021-03-17 NOTE — Assessment & Plan Note (Signed)
Last vitamin D Lab Results  Component Value Date   VD25OH 73.86 02/22/2020   Stable, cont oral replacement

## 2021-03-17 NOTE — Assessment & Plan Note (Signed)
Lab Results  Component Value Date   VITAMINB12 1,012 (H) 03/11/2021   Stable, cont oral replacement - b12 1000 mcg qd

## 2021-03-17 NOTE — Assessment & Plan Note (Signed)
Counseled to quit, pt for referral to pulm for LDCT program

## 2021-03-19 ENCOUNTER — Other Ambulatory Visit: Payer: Self-pay | Admitting: Internal Medicine

## 2021-03-19 DIAGNOSIS — Z471 Aftercare following joint replacement surgery: Secondary | ICD-10-CM | POA: Diagnosis not present

## 2021-03-19 NOTE — Telephone Encounter (Signed)
Please refill as per office routine med refill policy (all routine meds refilled for 3 mo or monthly per pt preference up to one year from last visit, then month to month grace period for 3 mo, then further med refills will have to be denied)  

## 2021-03-27 NOTE — Progress Notes (Signed)
[facility] [address] [place] Phone: [phone] Subjective:   I Kandace Blitz am serving as a Education administrator for Dr. Hulan Saas.  This visit occurred during the SARS-CoV-2 public health emergency.  Safety protocols were in place, including screening questions prior to the visit, additional usage of staff PPE, and extensive cleaning of exam room while observing appropriate contact time as indicated for disinfecting solutions.   I'm seeing this patient by the request  of:  Biagio Borg, MD  CC: Bilateral knee pain  [id number]   02/25/2021 PRP given today.  Post PRP instructions given with athletic trainer.  Patient will slowly increase activity over the course of time.  Warned of potential side effects.  Follow-up with me again in 6 weeks  Update 03/28/2021 Crystal [name] is a 70 y.o. female coming in with complaint of B knee pain. Patient states her left hamstring is still painful. Believes the PRP might have helped a little but has not been doing any exercises to test them. Flexion feels like the hamstring is swollen.  Patient given PRP injections at last exam 1 month ago.  Patient states maybe a little better but is having more tightness noted in the hamstring on the left side.    Past Medical History:  Diagnosis Date   ALLERGIC RHINITIS    Allergy    ANXIETY    no medations needed   Arthritis    Asthma    Bronchitis    Cataract    no surgery   COLONIC POLYPS, HX OF    Complication of anesthesia    oxygen sat dropped during endoscopy    COPD    pt unsure of this dx   Cough    DEPRESSION    Fatty liver    GERD    GLUCOSE INTOLERANCE    HYPERTENSION    IBS    Impaired glucose tolerance    Obstructive sleep apnea 06/25/2018   OSTEOPENIA    Osteopenia    Osteoporosis 07/30/2017   PERIPHERAL EDEMA    Pneumonia    Pneumonia, organism unspecified(486)    Sleep apnea    Oral Aparatus   TOBACCO USE  DISORDER/SMOKER-SMOKING CESSATION DISCUSSED    Past Surgical History:  Procedure Laterality Date   birthmark removed     from back as a child   bladder mesh     2012   CESAREAN SECTION     x2   CHOLECYSTECTOMY     COLONOSCOPY  2018   TA   MOUTH SURGERY  04/25/2020   tissue graft -room of mouth to front gums   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   Procedure: LEFT TOTAL HIP ARTHROPLASTY ANTERIOR APPROACH;  Surgeon: Melrose Nakayama, MD;  Location: [facility];  Service: Orthopedics;  Laterality: Left;   WISDOM TOOTH EXTRACTION     Social History   Socioeconomic History   Marital status: Widowed    Spouse name: Not on file   Number of children: 2   Years of education: Not on file   Highest education level: Not on file  Occupational History   Occupation: Occupational psychologist industries, signage for [employer]  Tobacco Use   Smoking status: Every Day    Packs/day: 1.00    Years: 42.00    Pack years: 42.00    Types: Cigarettes    Start date: 1979   Smokeless tobacco: Never  Vaping Use   Vaping Use: Never used  Substance and Sexual Activity   Alcohol use: Yes    Alcohol/week: 21.0 standard drinks    Types: 7 Glasses of wine, 14 Standard drinks or equivalent per week    Comment: couple of drinks 1-2 nites per week    Drug use: No   Sexual activity: Not on file  Other Topics Concern   Not on file  Social History Narrative   Not on file   Social Determinants of Health   Financial Resource Strain: Not on file  Food Insecurity: Not on file  Transportation Needs: Not on file  Physical Activity: Not on file  Stress: Not on file  Social Connections: Not on file   Allergies  Allergen Reactions   Ace Inhibitors Cough   Penicillins Rash   Family History  Problem Relation Age of Onset   Breast cancer Sister 32       +lump and tamoxifen; eventually had bilateral mastectomies   Allergic rhinitis Sister    Colon cancer Father        dx. >20   Bladder Cancer Father         dx. 18-79; not a smoker   Skin cancer Father        basal cell carcinoma   Colon polyps Father    Colon cancer Paternal Grandfather        unspecified age   Melanoma Paternal Grandfather        unspecified age   Skin cancer Mother        NOS; dx. 46s   Other Mother        hx of hysterectomy in her early 82s for fibroid cysts   Heart attack Maternal Uncle 67   Heart attack Maternal Grandmother 62   Thyroid cancer Paternal Grandmother 88   Breast cancer Sister        dx. early 67s; s/p BL mastectomies   Cervical cancer Sister        dx. late 73s   Skin cancer Brother        NOS   Anxiety disorder Other    Hypothyroidism Other    Hyperlipidemia Other    Diabetes Other    Esophageal cancer Neg Hx    Stomach cancer Neg Hx    Rectal cancer Neg Hx     Current Outpatient Medications (Endocrine & Metabolic):    denosumab (PROLIA) 60 MG/ML SOSY injection, Inject 60 mg into the skin every 6 (six) months.  Current Outpatient Medications (Cardiovascular):    losartan (COZAAR) 50 MG tablet, Take 1 tablet (50 mg total) by mouth daily. Annual appt due in May must see provider for future refills   metoprolol succinate (TOPROL-XL) 25 MG 24 hr tablet, TAKE 1 TABLET BY MOUTH EVERY DAY  Current Outpatient Medications (Respiratory):    albuterol (VENTOLIN HFA) 108 (90 Base) MCG/ACT inhaler, Inhale 1-2 puffs into the lungs every 4 (four) hours as needed for wheezing or shortness of breath (coughing).   fluticasone (FLONASE) 50 MCG/ACT nasal spray, Place 2 sprays into both nostrils daily as needed for allergies or rhinitis.  Current Outpatient Medications (Analgesics):    aspirin EC 81 MG tablet, Take 1 tablet (81 mg total) by mouth 2 (two) times daily after a meal. Swallow whole.   HYDROcodone-acetaminophen (NORCO/VICODIN) 5-325 MG tablet, Take 1-2 tablets by mouth every 6 (six) hours as needed for moderate pain.  Current Outpatient Medications (Hematological):    folic acid (FOLVITE)  520 MCG tablet, Take 400 mcg by mouth daily.  vitamin B-12 (CYANOCOBALAMIN) 1000 MCG tablet, Take 1 tablet (1,000 mcg total) by mouth daily.  Current Outpatient Medications (Other):    Calcium Carb-Cholecalciferol 600-800 MG-UNIT TABS, Take 1 tablet by mouth in the morning and at bedtime.    Cholecalciferol (VITAMIN D PO), Take 2,000 Units by mouth daily.    doxycycline (VIBRA-TABS) 100 MG tablet, Take 1 tablet (100 mg total) by mouth 2 (two) times daily.   tiZANidine (ZANAFLEX) 4 MG tablet, Take 1 tablet (4 mg total) by mouth every 6 (six) hours as needed for muscle spasms.   Reviewed prior external information including notes and imaging from  primary care provider As well as notes that were available from care everywhere and other healthcare systems.  Past medical history, social, surgical and family history all reviewed in electronic medical record.  No pertanent information unless stated regarding to the chief complaint.   Review of Systems:  No headache, visual changes, nausea, vomiting, diarrhea, constipation, dizziness, abdominal pain, skin rash, fevers, chills, night sweats, weight loss, swollen lymph nodes, body aches, joint swelling, chest pain, shortness of breath, mood changes. POSITIVE muscle aches, joint swelling  Objective  Blood pressure (!) 150/90, pulse 81, height 5' 3"  (1.6 m), weight 187 lb (84.8 kg), SpO2 98 %.   General: No apparent distress alert and oriented x3 mood and affect normal, dressed appropriately.  HEENT: Pupils equal, extraocular movements intact  Respiratory: Patient's speak in full sentences and does not appear short of breath  Cardiovascular: No lower extremity edema, non tender, no erythema  Gait normal with good balance and coordination.  MSK: Bilateral knee arthritis noted.  No instability noted with valgus and varus force.  Patient has low lacking 10 degrees of flexion on the left knee.  Patient does have fullness in the popliteal  area.  Procedure: Real-time Ultrasound Guided Injection of left knee Baker's cyst Device: GE Logiq Q7 Ultrasound guided injection is preferred based studies that show increased duration, increased effect, greater accuracy, decreased procedural pain, increased response rate, and decreased cost with ultrasound guided versus blind injection.  Verbal informed consent obtained.  Time-out conducted.  Noted no overlying erythema, induration, or other signs of local infection.  Skin prepped in a sterile fashion.  Local anesthesia: Topical Ethyl chloride.  With sterile technique and under real time ultrasound guidance: With a 22-gauge 2 inch needle patient was injected with 2 cc of 0.5% Marcaine from a posterior approach and with aspirated 5 cc of straw-colored fluid.  Injected 1 cc of Kenalog 40 mg per Completed without difficulty  Pain immediately resolved suggesting accurate placement of the medication.  Advised to call if fevers/chills, erythema, induration, drainage, or persistent bleeding.  Impression: Technically successful ultrasound guided injection.    Impression and Recommendations:     The above documentation has been reviewed and is accurate and complete Lyndal Pulley, DO

## 2021-03-28 ENCOUNTER — Ambulatory Visit: Payer: Self-pay

## 2021-03-28 ENCOUNTER — Encounter: Payer: Self-pay | Admitting: Family Medicine

## 2021-03-28 ENCOUNTER — Ambulatory Visit (INDEPENDENT_AMBULATORY_CARE_PROVIDER_SITE_OTHER): Payer: Medicare HMO | Admitting: Family Medicine

## 2021-03-28 ENCOUNTER — Other Ambulatory Visit: Payer: Self-pay

## 2021-03-28 VITALS — BP 150/90 | HR 81 | Ht 63.0 in | Wt 187.0 lb

## 2021-03-28 DIAGNOSIS — M170 Bilateral primary osteoarthritis of knee: Secondary | ICD-10-CM | POA: Diagnosis not present

## 2021-03-28 DIAGNOSIS — G8929 Other chronic pain: Secondary | ICD-10-CM | POA: Diagnosis not present

## 2021-03-28 DIAGNOSIS — M25562 Pain in left knee: Secondary | ICD-10-CM

## 2021-03-28 MED ORDER — DOXYCYCLINE HYCLATE 100 MG PO TABS: 100.0000 mg | ORAL_TABLET | Freq: Two times a day (BID) | ORAL | 0 refills | Status: DC

## 2021-03-28 NOTE — Assessment & Plan Note (Signed)
Patient had aspiration of the knee done today of the Baker's cyst.  Discussed with patient icing regimen and home exercises as well as compression.  Patient should do relatively well.  Patient given doxycycline in case patient has any type of redness or swelling.  Patient also is going to go out of the country and I think it would be good for her to have.  Patient will follow up with me again 2 to 3 months

## 2021-03-28 NOTE — Patient Instructions (Addendum)
Good to see you Wear knee compression sleeve with working out Sending in doxycycline for 10 days just in case If any swelling or redness seek medical attention See me again when you are back from [place]

## 2021-04-30 ENCOUNTER — Telehealth: Payer: Self-pay | Admitting: Internal Medicine

## 2021-04-30 NOTE — Telephone Encounter (Signed)
LVM for pt to rtn my call to schedule AWV with [facility]. Please schedule this appt if pt calls the office.

## 2021-05-27 ENCOUNTER — Other Ambulatory Visit: Payer: Self-pay | Admitting: Internal Medicine

## 2021-05-27 NOTE — Telephone Encounter (Signed)
Please refill as per office routine med refill policy (all routine meds refilled for 3 mo or monthly per pt preference up to one year from last visit, then month to month grace period for 3 mo, then further med refills will have to be denied)  

## 2021-05-28 DIAGNOSIS — R350 Frequency of micturition: Secondary | ICD-10-CM | POA: Diagnosis not present

## 2021-05-28 DIAGNOSIS — R3121 Asymptomatic microscopic hematuria: Secondary | ICD-10-CM | POA: Diagnosis not present

## 2021-05-28 DIAGNOSIS — N3946 Mixed incontinence: Secondary | ICD-10-CM | POA: Diagnosis not present

## 2021-05-30 ENCOUNTER — Ambulatory Visit: Payer: Medicare HMO | Admitting: Family Medicine

## 2021-05-30 NOTE — Progress Notes (Signed)
[place] [facility] [place] Phone: [phone] Subjective:   Crystal [name], am serving as a scribe for Dr. Hulan Saas. This visit occurred during the SARS-CoV-2 public health emergency.  Safety protocols were in place, including screening questions prior to the visit, additional usage of staff PPE, and extensive cleaning of exam room while observing appropriate contact time as indicated for disinfecting solutions.   I'm seeing this patient by the request  of:  Biagio Borg, MD  CC: knee pain f/u   [id number]  03/28/2021 Patient had aspiration of the knee done today of the Baker's cyst.  Discussed with patient icing regimen and home exercises as well as compression.  Patient should do relatively well.  Patient given doxycycline in case patient has any type of redness or swelling.  Patient also is going to go out of the country and I think it would be good for her to have.  Patient will follow up with me again 2 to 3 months  Update 06/03/2021 Crystal [name] is a 70 y.o. female coming in with complaint of B knee pain, L>R. Patient states that she continues to have tenderness in L knee. R knee pain is manageable.  Patient states nothing that has been giving out on her.  Patient states that she does alleviated with going up the stairs start patient some increasing discomfort still on the posterior aspect of the left knee.  Patient denies of any instability.  Patient was able to walk around [place] for a long amount of time without any significant problems that stopped her from activity and did have to isolate on a couple of occasions only.    Past Medical History:  Diagnosis Date   ALLERGIC RHINITIS    Allergy    ANXIETY    [name] medations needed   Arthritis    Asthma    Bronchitis    Cataract    [name] surgery   COLONIC POLYPS, HX OF    Complication of anesthesia    oxygen sat dropped during endoscopy    COPD    pt unsure of this  dx   Cough    DEPRESSION    Fatty liver    GERD    GLUCOSE INTOLERANCE    HYPERTENSION    IBS    Impaired glucose tolerance    Obstructive sleep apnea 06/25/2018   OSTEOPENIA    Osteopenia    Osteoporosis 07/30/2017   PERIPHERAL EDEMA    Pneumonia    Pneumonia, organism unspecified(486)    Sleep apnea    Oral Aparatus   TOBACCO USE DISORDER/SMOKER-SMOKING CESSATION DISCUSSED    Past Surgical History:  Procedure Laterality Date   birthmark removed     from back as a child   bladder mesh     2012   CESAREAN SECTION     x2   CHOLECYSTECTOMY     COLONOSCOPY  2018   TA   MOUTH SURGERY  04/25/2020   tissue graft -room of mouth to front gums   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   Procedure: LEFT TOTAL HIP ARTHROPLASTY ANTERIOR APPROACH;  Surgeon: Melrose Nakayama, MD;  Location: [facility];  Service: Orthopedics;  Laterality: Left;   WISDOM TOOTH EXTRACTION     Social History   Socioeconomic History   Marital status: Widowed    Spouse name: Not on file   Number of children: 2   Years of education: Not on file  Highest education level: Not on file  Occupational History   Occupation: Occupational psychologist industries, signage for [employer]  Tobacco Use   Smoking status: Every Day    Packs/day: 1.00    Years: 42.00    Pack years: 42.00    Types: Cigarettes    Start date: 1979   Smokeless tobacco: Never  Vaping Use   Vaping Use: Never used  Substance and Sexual Activity   Alcohol use: Yes    Alcohol/week: 21.0 standard drinks    Types: 7 Glasses of wine, 14 Standard drinks or equivalent per week    Comment: couple of drinks 1-2 nites per week    Drug use: [name]   Sexual activity: Not on file  Other Topics Concern   Not on file  Social History Narrative   Not on file   Social Determinants of Health   Financial Resource Strain: Not on file  Food Insecurity: Not on file  Transportation Needs: Not on file  Physical Activity: Not on file  Stress: Not on file   Social Connections: Not on file   Allergies  Allergen Reactions   Ace Inhibitors Cough   Penicillins Rash   Family History  Problem Relation Age of Onset   Breast cancer Sister 12       +lump and tamoxifen; eventually had bilateral mastectomies   Allergic rhinitis Sister    Colon cancer Father        dx. >2   Bladder Cancer Father        dx. 68-79; not a smoker   Skin cancer Father        basal cell carcinoma   Colon polyps Father    Colon cancer Paternal Grandfather        unspecified age   Melanoma Paternal Grandfather        unspecified age   Skin cancer Mother        NOS; dx. 26s   Other Mother        hx of hysterectomy in her early 83s for fibroid cysts   Heart attack Maternal Uncle 67   Heart attack Maternal Grandmother 62   Thyroid cancer Paternal Grandmother 88   Breast cancer Sister        dx. early 43s; s/p BL mastectomies   Cervical cancer Sister        dx. late 53s   Skin cancer Brother        NOS   Anxiety disorder Other    Hypothyroidism Other    Hyperlipidemia Other    Diabetes Other    Esophageal cancer Neg Hx    Stomach cancer Neg Hx    Rectal cancer Neg Hx     Current Outpatient Medications (Endocrine & Metabolic):    denosumab (PROLIA) 60 MG/ML SOSY injection, Inject 60 mg into the skin every 6 (six) months.  Current Outpatient Medications (Cardiovascular):    losartan (COZAAR) 50 MG tablet, TAKE 1 TABLET EVERY DAY (ANNUAL APPOINTMENT DUE IN MAY, MUST SEE MD FOR FUTURE REFILLS)   metoprolol succinate (TOPROL-XL) 25 MG 24 hr tablet, TAKE 1 TABLET BY MOUTH EVERY DAY  Current Outpatient Medications (Respiratory):    albuterol (VENTOLIN HFA) 108 (90 Base) MCG/ACT inhaler, Inhale 1-2 puffs into the lungs every 4 (four) hours as needed for wheezing or shortness of breath (coughing).   fluticasone (FLONASE) 50 MCG/ACT nasal spray, Place 2 sprays into both nostrils daily as needed for allergies or rhinitis.  Current Outpatient Medications  (Analgesics):  aspirin EC 81 MG tablet, Take 1 tablet (81 mg total) by mouth 2 (two) times daily after a meal. Swallow whole.   HYDROcodone-acetaminophen (NORCO/VICODIN) 5-325 MG tablet, Take 1-2 tablets by mouth every 6 (six) hours as needed for moderate pain.  Current Outpatient Medications (Hematological):    folic acid (FOLVITE) 791 MCG tablet, Take 400 mcg by mouth daily.   vitamin B-12 (CYANOCOBALAMIN) 1000 MCG tablet, Take 1 tablet (1,000 mcg total) by mouth daily.  Current Outpatient Medications (Other):    Calcium Carb-Cholecalciferol 600-800 MG-UNIT TABS, Take 1 tablet by mouth in the morning and at bedtime.    Cholecalciferol (VITAMIN D PO), Take 2,000 Units by mouth daily.    doxycycline (VIBRA-TABS) 100 MG tablet, Take 1 tablet (100 mg total) by mouth 2 (two) times daily.   tiZANidine (ZANAFLEX) 4 MG tablet, Take 1 tablet (4 mg total) by mouth every 6 (six) hours as needed for muscle spasms.   Reviewed prior external information including notes and imaging from  primary care provider As well as notes that were available from care everywhere and other healthcare systems.  Past medical history, social, surgical and family history all reviewed in electronic medical record.  [name] pertanent information unless stated regarding to the chief complaint.   Review of Systems:  [name] headache, visual changes, nausea, vomiting, diarrhea, constipation, dizziness, abdominal pain, skin rash, fevers, chills, night sweats, weight loss, swollen lymph nodes, body aches, , chest pain, shortness of breath, mood changes. POSITIVE muscle aches, joint swelling  Objective  Blood pressure (!) 148/102, pulse 94, height 5' 3"  (1.6 m), weight 187 lb (84.8 kg), SpO2 99 %.   General: [name] apparent distress alert and oriented x3 mood and affect normal, dressed appropriately.  HEENT: Pupils equal, extraocular movements intact  Respiratory: Patient's speak in full sentences and does not appear short of breath   Cardiovascular: [name] lower extremity edema, non tender, [name] erythema  Gait normal with good balance and coordination.  MSK: Patient's left knee does have some tenderness to palpation still in the popliteal area.  Trace effusion of the patellofemoral joint bilaterally.  Lacks last 5 degrees of flexion on the left.  Mild instability noted with valgus and varus force bilaterally.  Limited muscular skeletal ultrasound was performed and interpreted by Hulan Saas, M  Limited ultrasound of patient's knee on the left side shows the patient does have a very small Baker cyst comparatively significantly from previously.  Significant arthritic changes of the patellofemoral joint with trace effusion noted. Impression: Underlying arthritic changes of the knee with trace effusion of the patellofemoral but improvement of Baker's cyst   Impression and Recommendations:    The above documentation has been reviewed and is accurate and complete Lyndal Pulley, DO

## 2021-06-02 NOTE — Progress Notes (Signed)
HPI Female Smoker followed for OSA/ oral appliance, complicated by allergic rhinitis, anxiety, COPD, HBP, GERD HST 07/20/2018-AHI 20/hour, desaturation to 81%, body weight 192 pounds   ====================================================================   10/18/2018-70 year old female Smoker followed for OSA/ oral appliance, complicated by allergic rhinitis, anxiety, COPD, HBP, GERD HST 07/20/2018-AHI 20/hour, desaturation to 81%, body weight 192 pounds -----OSA; Pt using oral appliance through Dr. Ron Parker. Has been using x 10 days and returns on 11-11-18.  albuterol HFA, She is still getting used to wearing oral appliance and is not making any judgments yet about it.  Still moves around a lot in her sleep but we think she would have more trouble trying to work with CPAP hoses etc. Her allergist provides a rescue albuterol inhaler which she uses about 3 times a week, before exercise. I encouraged her again to make a real effort to stop smoking before it gets her in more trouble.  06/03/21- 70 year old female Smoker followed for OSA/ oral appliance,(Dr Ron Parker) complicated by allergic rhinitis, anxiety, COPD, HBP, GERD Allergist was following COPD    Body weight today-188 lbs Covid vax-4 Phizer -----OSA, uses mouth guard, denies problems Teeth are shifting, affecting bite. She has f/u appointment pending with Dr Ron Parker to discuss. Recently visited daughter who told her she snores some but without obvious apneas wearing appliance. She feels she sleeps ok.  She asked to talk about breathing and smoking. Interested in the pharmacy smoking program. Allergist did spirometry in May: FVC 2.44, FEV1 1.97/ 91%, R 0.81. Has albuterol hfa- uses before exertion, but can't tell any difference. Not a lot of wheeze or cough. CXR 08/31/20- IMPRESSION: No acute disease   ROS-see HPI   + = positive Constitutional:    weight loss, night sweats, fevers, chills, +fatigue, lassitude. HEENT:    headaches, difficulty  swallowing, tooth/dental problems, sore throat,       sneezing, itching, ear ache, nasal congestion, post nasal drip, snoring CV:    chest pain, orthopnea, PND, swelling in lower extremities, anasarca,                                   dizziness, palpitations Resp:  + shortness of breath with exertion or at rest.                productive cough,   non-productive cough, coughing up of blood.              change in color of mucus.  wheezing.   Skin:    rash or lesions. GI:  No-   heartburn, indigestion, abdominal pain, nausea, vomiting, diarrhea,                 change in bowel habits, loss of appetite GU: dysuria, change in color of urine, no urgency or frequency.   flank pain. MS:   joint pain, stiffness, decreased range of motion, back pain. Neuro-     nothing unusual Psych:  change in mood or affect.  depression or anxiety.   memory loss.  OBJ- Physical Exam General- Alert, Oriented, Affect-appropriate, Distress- none acute, + overweight Skin- rash-none, lesions- none, excoriation- none Lymphadenopathy- none Head- atraumatic            Eyes- Gross vision intact, PERRLA, conjunctivae and secretions clear            Ears- Hearing, canals-normal            Nose- Clear, no-Septal dev,  mucus, polyps, erosion, perforation             Throat- Mallampati II-III , mucosa clear , drainage- none, tonsils- atrophic Neck- flexible , trachea midline, no stridor , thyroid nl, carotid no bruit Chest - symmetrical excursion , unlabored           Heart/CV- RRR , no murmur , no gallop  , no rub, nl s1 s2                           - JVD- none , edema- none, stasis changes- none, varices- none           Lung- clear to P&A, wheeze- none, cough- none , dullness-none, rub- none           Chest wall-  Abd-  Br/ Gen/ Rectal- Not done, not indicated Extrem- cyanosis- none, clubbing, none, atrophy- none, strength- nl Neuro- grossly intact to observation

## 2021-06-03 ENCOUNTER — Encounter: Payer: Self-pay | Admitting: *Deleted

## 2021-06-03 ENCOUNTER — Ambulatory Visit (INDEPENDENT_AMBULATORY_CARE_PROVIDER_SITE_OTHER): Payer: Medicare HMO

## 2021-06-03 ENCOUNTER — Other Ambulatory Visit: Payer: Self-pay

## 2021-06-03 ENCOUNTER — Ambulatory Visit: Payer: Self-pay

## 2021-06-03 ENCOUNTER — Telehealth: Payer: Self-pay | Admitting: Internal Medicine

## 2021-06-03 ENCOUNTER — Ambulatory Visit: Payer: Medicare HMO | Admitting: Family Medicine

## 2021-06-03 ENCOUNTER — Encounter: Payer: Self-pay | Admitting: Internal Medicine

## 2021-06-03 ENCOUNTER — Ambulatory Visit: Payer: Medicare HMO | Admitting: Internal Medicine

## 2021-06-03 ENCOUNTER — Encounter: Payer: Self-pay | Admitting: Family Medicine

## 2021-06-03 VITALS — BP 140/78 | HR 84 | Temp 98.2°F | Ht 63.0 in | Wt 188.0 lb

## 2021-06-03 VITALS — BP 148/102 | HR 94 | Ht 63.0 in | Wt 187.0 lb

## 2021-06-03 DIAGNOSIS — R3121 Asymptomatic microscopic hematuria: Secondary | ICD-10-CM | POA: Diagnosis not present

## 2021-06-03 DIAGNOSIS — M25562 Pain in left knee: Secondary | ICD-10-CM | POA: Diagnosis not present

## 2021-06-03 DIAGNOSIS — Z720 Tobacco use: Secondary | ICD-10-CM | POA: Diagnosis not present

## 2021-06-03 DIAGNOSIS — G4733 Obstructive sleep apnea (adult) (pediatric): Secondary | ICD-10-CM

## 2021-06-03 DIAGNOSIS — G8929 Other chronic pain: Secondary | ICD-10-CM | POA: Diagnosis not present

## 2021-06-03 DIAGNOSIS — J449 Chronic obstructive pulmonary disease, unspecified: Secondary | ICD-10-CM

## 2021-06-03 DIAGNOSIS — M1712 Unilateral primary osteoarthritis, left knee: Secondary | ICD-10-CM

## 2021-06-03 NOTE — Assessment & Plan Note (Signed)
Discussed. Symptomatically quiet with little spirometry impact Plan- Smoking cessation, CXR, PFT, low dose CT screening referral

## 2021-06-03 NOTE — Assessment & Plan Note (Signed)
Has been satisfied with oral appliance, but it is shifting her bite Plan- f/u w Dr Ron Parker as scheduled. We can explore alternatives if she wants.

## 2021-06-03 NOTE — Patient Instructions (Signed)
Order- referral to smoking cessation program  Order- CXR  tobacco user  Order- schedule PFT   dx tobacco user  Order- referral to [facility]  Low dose screening chest  CT program

## 2021-06-03 NOTE — Assessment & Plan Note (Signed)
Degenerative arthritis with a Baker's cyst.  Discussed with patient about continuing to monitor.  Patient wants to hold on any type of injection at this time.  Does not want any surgical intervention.  Follow-up with me again in 2 to 3 months

## 2021-06-03 NOTE — Telephone Encounter (Signed)
Patient needs to be referred to smoking cessation problem per Dr. Annamaria Boots.   Message routed to [facility]/Pharmacy Team

## 2021-06-03 NOTE — Patient Instructions (Signed)
Knee compression with activity Stay active See me in 2-3 months

## 2021-06-14 DIAGNOSIS — H25813 Combined forms of age-related cataract, bilateral: Secondary | ICD-10-CM | POA: Diagnosis not present

## 2021-06-14 DIAGNOSIS — H43811 Vitreous degeneration, right eye: Secondary | ICD-10-CM | POA: Diagnosis not present

## 2021-06-14 DIAGNOSIS — H02423 Myogenic ptosis of bilateral eyelids: Secondary | ICD-10-CM | POA: Diagnosis not present

## 2021-06-14 DIAGNOSIS — H353131 Nonexudative age-related macular degeneration, bilateral, early dry stage: Secondary | ICD-10-CM | POA: Diagnosis not present

## 2021-06-28 ENCOUNTER — Telehealth: Payer: Self-pay | Admitting: Student-PharmD

## 2021-06-28 NOTE — Telephone Encounter (Signed)
Crystal [name] is a 70 y.o. female and has been referred to the pharmacist telephone-based smoking cessation service by pulmonologist Dr. Annamaria Boots.   Tobacco Use History Current tobacco use: 20 cigarettes/day (1 ppd) Time to first cigarette: < 30 minutes Started smoking at 70 years old  Quit Attempt History  Have you tried to quit in the past? Yes . Quit smoking for 11 months previously, but when mother moved in with her from the nursing home she started smoking again from the stress, and then her mother passed away 3 months later. Longest time other than that was 6 or 8 weeks about 20 years ago.  Most recent quit attempt: February 2017 - January 2018  Longest time ever been tobacco free: 11 months What helped? In 2017 she used [facility] cessation program. Her target quit date at that time was the anniversary of husband's death (died at 62 of massive heart attack in 2008). She used to smoke 2 ppd when her husband was alive (currently 1 ppd). At that time, she used nicotine gum and patches, which worked really well for her. She also would keep a paper on her wall in her office that she would write the number of days it had been since she had quit which she found very motivating.  What was difficult? Stress of 24/7 care of mother followed by her mother's passing  Tobacco Use Habits: Triggers include getting together with a group that smokes. Working at home (works 1-2 hours, takes break to go smoke). More of a habit. Excitement - celebrating accomplishment/getting work done. Smoking when driving - doesn't bring cigarettes in her car anymore so she has stopped this. She gets edgy if she haven't smoked in a while. At least 1.5-2 hours when she starts craving. In the morning she smokes 2 or 3 cigarettes in the first hour and a half of waking up. Then it's usually 1 cigarette about every hour through the day.  Does not wake at night to smoke. Has cravings but doesn't get up to do it.  Reports she hasn't done that in years.  Alcohol use: Drinks 1 glass of wine/night, fifth of vodka in a week. When drinking, she smokes more.  Other smokers in household or daily life: None (lives alone). Daughter smokes but lives in [place]. Son vapes.  Identify social support: Siblings are her biggest supporters (2 brothers, 2 sisters). Older brother has quit for >30 years now and encourages her to smoke. He is her only sibling that is local to [place]. Best friend - she and her husband quit smoking 15 years ago - could reach out to her or her husband for support.   On a scale of 1-10, how CONFIDENT are you that you will successfully quit: 9 (8-10)  Barriers/concerns: Boredom - finding something to replace time with.   On a scale of 1-10, how IMPORTANT is it to you that you quit: 10 Motivators: She is getting braces soon to correct her teeth/bite that have shifted since using a sleep apnea mouth piece, financial (spend $250/month on cigarettes), she would love to surprise her brother that she has quit smoking, health and longevity (husband died from a heart attack and was a heavy smoker, grandsons are 22 and 5 and she wants to be around to watch them graduate high school and college)   Past pharmacotherapy trials:  [x]  Nicotine gum: worked well, quit x 11 months []  Nicotine lozenge [x]  Nicotine patch: worked well, quit x 11  months []  Nicotine inhaler []  Nicotine nasal spray []  Bupropion (Zyban) [x]  Varenicline (Chantix): Tried Chantix once at a difficult time in life (work, husband, finances), found that it did not work well, had more "blue spells."   Current Outpatient Medications  Medication Instructions   albuterol (VENTOLIN HFA) 108 (90 Base) MCG/ACT inhaler 1-2 puffs, Inhalation, Every 4 hours PRN   aspirin EC 81 mg, Oral, 2 times daily after meals, Swallow whole.   Calcium Carb-Cholecalciferol 600-800 MG-UNIT TABS 1 tablet, Oral, 2 times daily   Cholecalciferol (VITAMIN D PO) 2,000 Units,  Oral, Daily   denosumab (PROLIA) 60 mg, Subcutaneous, Every 6 months   doxycycline (VIBRA-TABS) 100 mg, Oral, 2 times daily   fluticasone (FLONASE) 50 MCG/ACT nasal spray 2 sprays, Each Nare, Daily PRN   folic acid (FOLVITE) 244 mcg, Oral, Daily   losartan (COZAAR) 50 MG tablet TAKE 1 TABLET EVERY DAY (ANNUAL APPOINTMENT DUE IN MAY, MUST SEE MD FOR FUTURE REFILLS)   metoprolol succinate (TOPROL-XL) 25 MG 24 hr tablet TAKE 1 TABLET BY MOUTH EVERY DAY   vitamin B-12 (CYANOCOBALAMIN) 1,000 mcg, Oral, Daily    Assessment/Plan: Patient states interest in quitting smoking. Set quit date of October 1st.   She already has gum she purchased over the counter from [employer] - 2 mg. She tried 4 mg in the past but "felt like I was going to jump out of her skin." Encouraged her to use the 2 mg gum for this reason even though she smokes within 30 minutes of waking. Discussed that once she gets her braces, which is dependent on when she gets a CPAP machine, she will need to stop using the gum. If needed, we can switch to lozenges at that time as long as she does not chew them and this is okayed by her orthodontist. She also has 14 mg patches. The patches and gum were affordable over the counter for her at [facility]. When she used these in the past, she always takes off the patch at night as it disrupted her sleep. Since she doesn't smoke at night, discussed that taking patch off at night makes sense. She reports that ideally, she would like to be off nicotine replacement and doing well on her own by January 1st, 2023 as this would give her 3 months, but she also does not want to rush her progress. She knows that she can only quit smoking by taking it a day at a time. She will start the 14 mg patches and 2 mg gum on October 1st when she quits. Once she is not craving a cigarette or thinking about smoking we will plan to decrease to 7 mg patches then wean from there.   Discussed options for smoking cessation agents and  patient is agreeable to: [x]  Nicotine gum 2 mg []  Nicotine lozenge  [x]  Nicotine patch 14 mg []  Bupropion (Zyban) - patient denies history of seizure/epilepsy, bulimia/anorexia, or acute alcohol withdrawal []  Varenicline (Chantix)  Treatment with nicotine patches was reviewed with the patient including name, instructions, goals of therapy, potential adverse effects including mild itching or redness at the point of application, headache, trouble sleeping, and/or vivid dreams. Patient counseled on purpose, proper use, and potential adverse effects of nicotine gum including jaw soreness and upset stomach if swallowing saliva. Maximum 24 pieces/day.   The following counseling was provided: [x]  Anticipated nicotine withdrawal symptoms [x]  Coping skills/strategies [x]  Information on 1-800-QUITNOW support program [x]  Tell family and friends about quitting [x]  Stress management [x]   Remove tobacco products (cigarettes, lighters, ash trays) by September 30th   Patient was advised to contact Pulmonary Clinic if questions/concerns arise. Patient verbalized understanding of information.  Follow up by phone call Friday, September 30th. She prefers 7-10am (mornings are better on MWF, versus T/Th it would need to be after 10am)   Time spent: 55 minutes  Rebbeca Paul, PharmD PGY2 [facility] Pharmacy Resident 06/28/2021 4:34 PM

## 2021-07-01 ENCOUNTER — Encounter: Payer: Self-pay | Admitting: Primary Care

## 2021-07-01 ENCOUNTER — Other Ambulatory Visit: Payer: Self-pay

## 2021-07-01 ENCOUNTER — Ambulatory Visit: Payer: Medicare HMO | Admitting: Primary Care

## 2021-07-01 VITALS — BP 128/78 | HR 76 | Temp 98.3°F | Ht 63.0 in | Wt 188.6 lb

## 2021-07-01 DIAGNOSIS — G4733 Obstructive sleep apnea (adult) (pediatric): Secondary | ICD-10-CM

## 2021-07-01 NOTE — Assessment & Plan Note (Addendum)
-   HST 07/20/18 showed moderate OSA, AHI 20/hr. She has symptoms of snoring and apnea. She has been using an oral appliance  under the direction of Dr. Ron Parker, however, this has caused a misalignment of her bite and she will be needing braces to correct this. Plan change to CPAP therapy. We will place an order for new CPAP auto titrate 5-15cm h20. She can use borrowed CPAP machine in the mean time but will need to bring this to [facility] to have the device registered to her. FU in 3 months with Dr. Annamaria Boots or sooner if needed.

## 2021-07-01 NOTE — Progress Notes (Signed)
@Patient  ID: Crystal [name], female    DOB: 1951/09/20, 70 y.o.   MRN: [id number]  Chief Complaint  Patient presents with   Follow-up    She has been using an oral device and needs braces.     Referring provider: Biagio Borg, MD  HPI: 70 year old female, former smoker (42 pack year hx). PMH significant for COPD mixed type, OSA, allergic rhinitis, HTN, allergic rhinitis, GERD, snoring. Patient of Dr. Annamaria Boots, last seen in office on 06/03/21. Advised to follow-up in 3 months.  Previous [facility] pulmonary encounter: 06/03/21- Dr. Annamaria Boots 70 year old female Smoker followed for OSA/ oral appliance,(Dr Ron Parker) complicated by allergic rhinitis, anxiety, COPD, HBP, GERD Allergist was following COPD    Body weight today-188 lbs Covid vax-4 Phizer -----OSA, uses mouth guard, denies problems Teeth are shifting, affecting bite. She has f/u appointment pending with Dr Ron Parker to discuss. Recently visited daughter who told her she snores some but without obvious apneas wearing appliance. She feels she sleeps ok.  She asked to talk about breathing and smoking. Interested in the pharmacy smoking program. Allergist did spirometry in May: FVC 2.44, FEV1 1.97/ 91%, R 0.81. Has albuterol hfa- uses before exertion, but can't tell any difference. Not a lot of wheeze or cough. CXR 08/31/20- IMPRESSION: No acute disease  07/01/2021- Interim hx  Patient presents today for 1 month follow-up. She follows with Dr. Ron Parker for OSA, she is currently using oral appliance but this has messed up her bite alignment. She will need to be switched over to CPAP and will be get orthodonic braces. She has symptoms of snoring and witnessed apnea.   HST 07/20/2018>> AHI 20.0/hr with SpO2 low 81%    Allergies  Allergen Reactions   Ace Inhibitors Cough   Penicillins Rash    Immunization History  Administered Date(s) Administered   Hepatitis B, adult 01/23/2017, 02/23/2017   Hepatitis B, ped/adol 07/27/2017   PFIZER Comirnaty(Gray  Top)Covid-19 Tri-Sucrose Vaccine 02/10/2021   PFIZER(Purple Top)SARS-COV-2 Vaccination 12/13/2019, 12/27/2019, 08/25/2020   Pneumococcal Conjugate-13 03/05/2016   Pneumococcal Polysaccharide-23 03/11/2017   Td 12/21/2006   Tdap 02/17/2013    Past Medical History:  Diagnosis Date   ALLERGIC RHINITIS    Allergy    ANXIETY    no medations needed   Arthritis    Asthma    Bronchitis    Cataract    no surgery   COLONIC POLYPS, HX OF    Complication of anesthesia    oxygen sat dropped during endoscopy    COPD    pt unsure of this dx   Cough    DEPRESSION    Fatty liver    GERD    GLUCOSE INTOLERANCE    HYPERTENSION    IBS    Impaired glucose tolerance    Obstructive sleep apnea 06/25/2018   OSTEOPENIA    Osteopenia    Osteoporosis 07/30/2017   PERIPHERAL EDEMA    Pneumonia    Pneumonia, organism unspecified(486)    Sleep apnea    Oral Aparatus   TOBACCO USE DISORDER/SMOKER-SMOKING CESSATION DISCUSSED     Tobacco History: Social History   Tobacco Use  Smoking Status Some Days   Packs/day: 1.00   Years: 42.00   Pack years: 42.00   Types: Cigarettes   Start date: 10/20/1977  Smokeless Tobacco Never   Ready to quit: Not Answered Counseling given: Not Answered   Outpatient Medications Prior to Visit  Medication Sig Dispense Refill   albuterol (VENTOLIN HFA) 108 (90  Base) MCG/ACT inhaler Inhale 1-2 puffs into the lungs every 4 (four) hours as needed for wheezing or shortness of breath (coughing). 18 g 1   aspirin EC 81 MG tablet Take 1 tablet (81 mg total) by mouth 2 (two) times daily after a meal. Swallow whole. (Patient taking differently: Take 81 mg by mouth as needed. Swallow whole.) 60 tablet 0   Calcium Carb-Cholecalciferol 600-800 MG-UNIT TABS Take 1 tablet by mouth in the morning and at bedtime.      Cholecalciferol (VITAMIN D PO) Take 2,000 Units by mouth daily.      denosumab (PROLIA) 60 MG/ML SOSY injection Inject 60 mg into the skin every 6 (six) months.      folic acid (FOLVITE) 081 MCG tablet Take 400 mcg by mouth daily.     losartan (COZAAR) 50 MG tablet TAKE 1 TABLET EVERY DAY (ANNUAL APPOINTMENT DUE IN MAY, MUST SEE MD FOR FUTURE REFILLS) 90 tablet 0   metoprolol succinate (TOPROL-XL) 25 MG 24 hr tablet TAKE 1 TABLET BY MOUTH EVERY DAY 90 tablet 3   Vibegron (GEMTESA) 75 MG TABS Take 75 mg by mouth daily.     vitamin B-12 (CYANOCOBALAMIN) 1000 MCG tablet Take 1 tablet (1,000 mcg total) by mouth daily. 90 tablet 3   doxycycline (VIBRA-TABS) 100 MG tablet Take 1 tablet (100 mg total) by mouth 2 (two) times daily. (Patient not taking: Reported on 07/01/2021) 10 tablet 0   fluticasone (FLONASE) 50 MCG/ACT nasal spray Place 2 sprays into both nostrils daily as needed for allergies or rhinitis. (Patient not taking: Reported on 07/01/2021)     No facility-administered medications prior to visit.    Review of Systems  Review of Systems  Constitutional: Negative.   HENT: Negative.    Respiratory: Negative.    Psychiatric/Behavioral: Negative.      Physical Exam  BP 128/78 (BP Location: Left Arm, Patient Position: Sitting, Cuff Size: Normal)   Pulse 76   Temp 98.3 F (36.8 C) (Oral)   Ht 5' 3"  (1.6 m)   Wt 188 lb 9.6 oz (85.5 kg)   SpO2 99%   BMI 33.41 kg/m  Physical Exam Constitutional:      Appearance: Normal appearance.  HENT:     Head: Normocephalic and atraumatic.  Cardiovascular:     Rate and Rhythm: Normal rate and regular rhythm.  Pulmonary:     Effort: Pulmonary effort is normal.     Breath sounds: Normal breath sounds.  Skin:    General: Skin is warm and dry.  Neurological:     General: No focal deficit present.     Mental Status: She is alert and oriented to person, place, and time. Mental status is at baseline.  Psychiatric:        Mood and Affect: Mood normal.        Behavior: Behavior normal.        Thought Content: Thought content normal.        Judgment: Judgment normal.     Lab Results:  CBC     Component Value Date/Time   WBC 6.5 03/11/2021 0741   RBC 4.34 03/11/2021 0741   HGB 14.0 03/11/2021 0741   HCT 40.9 03/11/2021 0741   PLT 273.0 03/11/2021 0741   MCV 94.3 03/11/2021 0741   MCH 32.1 08/31/2020 0838   MCHC 34.3 03/11/2021 0741   RDW 13.8 03/11/2021 0741   LYMPHSABS 2.0 03/11/2021 0741   MONOABS 0.5 03/11/2021 0741   EOSABS 0.2 03/11/2021 0741  BASOSABS 0.0 03/11/2021 0741    BMET    Component Value Date/Time   NA 141 03/11/2021 0741   K 4.4 03/11/2021 0741   CL 106 03/11/2021 0741   CO2 26 03/11/2021 0741   GLUCOSE 94 03/11/2021 0741   BUN 16 03/11/2021 0741   CREATININE 0.65 03/11/2021 0741   CALCIUM 9.2 03/11/2021 0741   GFRNONAA >60 08/31/2020 0838   GFRAA >60 04/24/2011 0800    BNP No results found for: BNP  ProBNP No results found for: PROBNP  Imaging: DG Chest 2 View  Result Date: 06/03/2021 CLINICAL DATA:  tobacco user EXAM: CHEST - 2 VIEW COMPARISON:  08/31/2020 FINDINGS: Frontal and lateral views of the chest demonstrate an unremarkable cardiac silhouette. No airspace disease, effusion, or pneumothorax. No acute bony abnormalities. IMPRESSION: 1. No acute intrathoracic process. Electronically Signed   By: Randa Ngo M.D.   On: 06/03/2021 12:27   [place] LIMITED JOINT SPACE STRUCTURES LOW LEFT(NO LINKED CHARGES)  Result Date: 06/16/2021 Limited muscular skeletal ultrasound was performed and interpreted by Hulan Saas, M Limited ultrasound of patient's knee on the left side shows the patient does have a very small Baker cyst comparatively significantly from previously.  Significant arthritic changes of the patellofemoral joint with trace effusion noted. Impression: Underlying arthritic changes of the knee with trace effusion of the patellofemoral but improvement of Baker's cyst    Assessment & Plan:   Obstructive sleep apnea - HST 07/20/18 showed moderate OSA, AHI 20/hr. She has symptoms of snoring and apnea. She has been using an oral  appliance  under the direction of Dr. Ron Parker, however, this has caused a misalignment of her bite and she will be needing braces to correct this. Plan change to CPAP therapy. We will place an order for new CPAP auto titrate 5-15cm h20. She can use borrowed CPAP machine in the mean time but will need to bring this to [facility] to have the device registered to her. FU in 3 months with Dr. Annamaria Boots or sooner if needed.      Martyn Ehrich, NP 07/01/2021

## 2021-07-01 NOTE — Patient Instructions (Addendum)
Recommendations: - Please bring your friends CPAP machine to [facility] and see if they can register device to you and get you set up  - Aim to wear CPAP every night for 4-6 hours or more each night   Orders: - New CPAP start, auto titrate 5-15cm h20 with mask of choice, supplies and enroll in airview (Sleep study was on 07/20/2018, she has been using oral appliance since. Symptoms snoring and apnea)   Follow-up: - 3 months with Dr. Annamaria Boots (recall should already be in)  CPAP and BPAP Information CPAP and BPAP (also called BiPAP) are methods that use air pressure to keep your airways open and to help you breathe well. CPAP and BPAP use different amounts of pressure. Your health care provider will tell you whether CPAP or BPAP would be more helpful for you. CPAP stands for "continuous positive airway pressure." With CPAP, the amount of pressure stays the same while you breathe in (inhale) and out (exhale). BPAP stands for "bi-level positive airway pressure." With BPAP, the amount of pressure will be higher when you inhale and lower when you exhale. This allows you to take larger breaths. CPAP or BPAP may be used in the hospital, or your health care provider may want you to use it at home. You may need to have a sleep study before your health care provider can order a machine for you to use at home. What are the advantages? CPAP or BPAP can be helpful if you have: Sleep apnea. Chronic obstructive pulmonary disease (COPD). Heart failure. Medical conditions that cause muscle weakness, including muscular dystrophy or amyotrophic lateral sclerosis (ALS). Other problems that cause breathing to be shallow, weak, abnormal, or difficult. CPAP and BPAP are most commonly used for obstructive sleep apnea (OSA) to keep the airways from collapsing when the muscles relax during sleep. What are the risks? Generally, this is a safe treatment. However, problems may occur, including: Irritated skin or skin sores if  the mask does not fit properly. Dry or stuffy nose or nosebleeds. Dry mouth. Feeling gassy or bloated. Sinus or lung infection if the equipment is not cleaned properly. When should CPAP or BPAP be used? In most cases, the mask only needs to be worn during sleep. Generally, the mask needs to be worn throughout the night and during any daytime naps. People with certain medical conditions may also need to wear the mask at other times, such as when they are awake. Follow instructions from your health care provider about when to use the machine. What happens during CPAP or BPAP? Both CPAP and BPAP are provided by a small machine with a flexible plastic tube that attaches to a plastic mask that you wear. Air is blown through the mask into your nose or mouth. The amount of pressure that is used to blow the air can be adjusted on the machine. Your health care provider will set the pressure setting and help you find the best mask for you. Tips for using the mask Because the mask needs to be snug, some people feel trapped or closed-in (claustrophobic) when first using the mask. If you feel this way, you may need to get used to the mask. One way to do this is to hold the mask loosely over your nose or mouth and then gradually apply the mask more snugly. You can also gradually increase the amount of time that you use the mask. Masks are available in various types and sizes. If your mask does not fit well,  talk with your health care provider about getting a different one. Some common types of masks include: Full face masks, which fit over the mouth and nose. Nasal masks, which fit over the nose. Nasal pillow or prong masks, which fit into the nostrils. If you are using a mask that fits over your nose and you tend to breathe through your mouth, a chin strap may be applied to help keep your mouth closed. Use a skin barrier to protect your skin as told by your health care provider. Some CPAP and BPAP machines have  alarms that may sound if the mask comes off or develops a leak. If you have trouble with the mask, it is very important that you talk with your health care provider about finding a way to make the mask easier to tolerate. Do not stop using the mask. There could be a negative impact on your health if you stop using the mask. Tips for using the machine Place your CPAP or BPAP machine on a secure table or stand near an electrical outlet. Know where the on/off switch is on the machine. Follow instructions from your health care provider about how to set the pressure on your machine and when you should use it. Do not eat or drink while the CPAP or BPAP machine is on. Food or fluids could get pushed into your lungs by the pressure of the CPAP or BPAP. For home use, CPAP and BPAP machines can be rented or purchased through home health care companies. Many different brands of machines are available. Renting a machine before purchasing may help you find out which particular machine works well for you. Your health insurance company may also decide which machine you may get. Keep the CPAP or BPAP machine and attachments clean. Ask your health care provider for specific instructions. Check the humidifier if you have a dry stuffy nose or nosebleeds. Make sure it is working correctly. Follow these instructions at home: Take over-the-counter and prescription medicines only as told by your health care provider. Ask if you can take sinus medicine if your sinuses are blocked. Do not use any products that contain nicotine or tobacco. These products include cigarettes, chewing tobacco, and vaping devices, such as e-cigarettes. If you need help quitting, ask your health care provider. Keep all follow-up visits. This is important. Contact a health care provider if: You have redness or pressure sores on your head, face, mouth, or nose from the mask or head gear. You have trouble using the CPAP or BPAP machine. You cannot  tolerate wearing the CPAP or BPAP mask. Someone tells you that you snore even when wearing your CPAP or BPAP. Get help right away if: You have trouble breathing. You feel confused. Summary CPAP and BPAP are methods that use air pressure to keep your airways open and to help you breathe well. If you have trouble with the mask, it is very important that you talk with your health care provider about finding a way to make the mask easier to tolerate. Do not stop using the mask. There could be a negative impact to your health if you stop using the mask. Follow instructions from your health care provider about when to use the machine. This information is not intended to replace advice given to you by your health care provider. Make sure you discuss any questions you have with your health care provider. Document Revised: 09/14/2020 Document Reviewed: 09/14/2020 Elsevier Patient Education  2022 [employer].

## 2021-07-04 ENCOUNTER — Other Ambulatory Visit: Payer: Self-pay | Admitting: *Deleted

## 2021-07-04 DIAGNOSIS — F17210 Nicotine dependence, cigarettes, uncomplicated: Secondary | ICD-10-CM

## 2021-07-04 DIAGNOSIS — Z87891 Personal history of nicotine dependence: Secondary | ICD-10-CM

## 2021-07-11 DIAGNOSIS — R3121 Asymptomatic microscopic hematuria: Secondary | ICD-10-CM | POA: Diagnosis not present

## 2021-07-11 DIAGNOSIS — N3946 Mixed incontinence: Secondary | ICD-10-CM | POA: Diagnosis not present

## 2021-07-16 ENCOUNTER — Telehealth: Payer: Self-pay

## 2021-07-16 NOTE — Telephone Encounter (Signed)
Prolia VOB initiated via [email or web address]  Last OV:  Next OV:  Last Prolia inj: 01/23/21 Next Prolia inj DUE: 07/26/21

## 2021-07-17 ENCOUNTER — Ambulatory Visit (INDEPENDENT_AMBULATORY_CARE_PROVIDER_SITE_OTHER)
Admission: RE | Admit: 2021-07-17 | Discharge: 2021-07-17 | Disposition: A | Discharge status: Home / Self Care | Payer: Medicare HMO | Source: Ambulatory Visit | Attending: Acute Care | Admitting: Acute Care

## 2021-07-17 ENCOUNTER — Other Ambulatory Visit: Payer: Self-pay

## 2021-07-17 ENCOUNTER — Encounter: Payer: Self-pay | Admitting: Acute Care

## 2021-07-17 ENCOUNTER — Telehealth: Payer: Medicare HMO | Admitting: Acute Care

## 2021-07-17 DIAGNOSIS — Z87891 Personal history of nicotine dependence: Secondary | ICD-10-CM

## 2021-07-17 DIAGNOSIS — F17210 Nicotine dependence, cigarettes, uncomplicated: Secondary | ICD-10-CM

## 2021-07-17 NOTE — Patient Instructions (Signed)
Thank you for participating in the [facility]. It was our pleasure to meet you today. We will call you with the results of your scan within the next few days. Your scan will be assigned a Lung RADS category score by the physicians reading the scans.  This Lung RADS score determines follow up scanning.  See below for description of categories, and follow up screening recommendations. We will be in touch to schedule your follow up screening annually or based on recommendations of our providers. We will fax a copy of your scan results to your Primary Care Physician, or the physician who referred you to the program, to ensure they have the results. Please call the office if you have any questions or concerns regarding your scanning experience or results.  Our office number is [phone]. Please speak with Denise Phelps, RN. She is our Lung Cancer Screening RN. If she is unavailable when you call, please have the office staff send her a message. She will return your call at her earliest convenience. Remember, if your scan is normal, we will scan you annually as long as you continue to meet the criteria for the program. (Age 55-77, Current smoker or smoker who has quit within the last 15 years). If you are a smoker, remember, quitting is the single most powerful action that you can take to decrease your risk of lung cancer and other pulmonary, breathing related problems. We know quitting is hard, and we are here to help.  Please let us know if there is anything we can do to help you meet your goal of quitting. If you are a former smoker, congratulations. We are proud of you! Remain smoke free! Remember you can refer friends or family members through the number above.  We will screen them to make sure they meet criteria for the program. Thank you for helping us take better care of you by participating in Lung Screening.  Lung RADS Categories:  Lung RADS 1: no nodules  or definitely non-concerning nodules.  Recommendation is for a repeat annual scan in 12 months.  Lung RADS 2:  nodules that are non-concerning in appearance and behavior with a very low likelihood of becoming an active cancer. Recommendation is for a repeat annual scan in 12 months.  Lung RADS 3: nodules that are probably non-concerning , includes nodules with a low likelihood of becoming an active cancer.  Recommendation is for a 6-month repeat screening scan. Often noted after an upper respiratory illness. We will be in touch to make sure you have no questions, and to schedule your 6-month scan.  Lung RADS 4 A: nodules with concerning findings, recommendation is most often for a follow up scan in 3 months or additional testing based on our provider's assessment of the scan. We will be in touch to make sure you have no questions and to schedule the recommended 3 month follow up scan.  Lung RADS 4 B:  indicates findings that are concerning. We will be in touch with you to schedule additional diagnostic testing based on our provider's  assessment of the scan.   

## 2021-07-17 NOTE — Progress Notes (Signed)
Virtual Visit via Video Note  I connected with Crystal [name] on 07/17/21 at 10:30 AM EDT by a video enabled telemedicine application and verified that I am speaking with the correct person using two identifiers.  Location: Patient: At home Provider: [facility], [place], [place], [address]    I discussed the limitations of evaluation and management by telemedicine and the availability of in person appointments. The patient expressed understanding and agreed to proceed.  Shared Decision Making Visit Lung Cancer Screening Program [phone])   Eligibility: Age 70 y.o. Pack Years Smoking History Calculation 79 pack year smoking history (# packs/per year x # years smoked) Recent History of coughing up blood  no Unexplained weight loss? no ( >Than 15 pounds within the last 6 months ) Prior History Lung / other cancer no (Diagnosis within the last 5 years already requiring surveillance chest CT Scans). Smoking Status Current Smoker Former Smokers: Years since quit: NA  Quit Date: NA  Visit Components: Discussion included one or more decision making aids. no Discussion included risk/benefits of screening. no Discussion included potential follow up diagnostic testing for abnormal scans. no Discussion included meaning and risk of over diagnosis. no Discussion included meaning and risk of False Positives. no Discussion included meaning of total radiation exposure. no  Counseling Included: Importance of adherence to annual lung cancer LDCT screening. no Impact of comorbidities on ability to participate in the program. no Ability and willingness to under diagnostic treatment. no  Smoking Cessation Counseling: Current Smokers:  Discussed importance of smoking cessation. yes Information about tobacco cessation classes and interventions provided to patient. yes Patient provided with "ticket" for LDCT Scan. yes Symptomatic Patient. no  Counseling Diagnosis Code: Tobacco Use  Z72.0 Asymptomatic Patient yes  Counseling (Intermediate counseling: > three minutes counseling) F3545 Former Smokers:  Discussed the importance of maintaining cigarette abstinence. yes Diagnosis Code: Personal History of Nicotine Dependence. G25.638 Information about tobacco cessation classes and interventions provided to patient. Yes Patient provided with "ticket" for LDCT Scan. yes Written Order for Lung Cancer Screening with LDCT placed in Epic. Yes (CT Chest Lung Cancer Screening Low Dose W/O CM) [id number] Z12.2-Screening of respiratory organs Z87.891-Personal history of nicotine dependence  I have spent 25 minutes of face to face time with Crystal [name]  discussing the risks and benefits of lung cancer screening. We viewed a power point together that explained in detail the above noted topics. We paused at intervals to allow for questions to be asked and answered to ensure understanding.We discussed that the single most powerful action that she can take to decrease her risk of developing lung cancer is to quit smoking. We discussed whether or not she is ready to commit to setting a quit date. We discussed options for tools to aid in quitting smoking including nicotine replacement therapy, non-nicotine medications, support groups, Quit Smart classes, and behavior modification. We discussed that often times setting smaller, more achievable goals, such as eliminating 1 cigarette a day for a week and then 2 cigarettes a day for a week can be helpful in slowly decreasing the number of cigarettes smoked. This allows for a sense of accomplishment as well as providing a clinical benefit. I gave her the " Be Stronger Than Your Excuses" card with contact information for community resources, classes, free nicotine replacement therapy, and access to mobile apps, text messaging, and on-line smoking cessation help. I have also given her my card and contact information in the event she needs to contact  me. We  discussed the time and location of the scan, and that either Doroteo Glassman RN or I will call with the results within 24-48 hours of receiving them. I have offered her  a copy of the power point we viewed  as a resource in the event they need reinforcement of the concepts we discussed today in the office. The patient verbalized understanding of all of  the above and had no further questions upon leaving the office. They have my contact information in the event they have any further questions.  I spent 3 minutes counseling on smoking cessation and the health risks of continued tobacco abuse.  I explained to the patient that there has been a high incidence of coronary artery disease noted on these exams. I explained that this is a non-gated exam therefore degree or severity cannot be determined. This patient is on statin therapy. I have asked the patient to follow-up with their PCP regarding any incidental finding of coronary artery disease and management with diet or medication as their PCP  feels is clinically indicated. The patient verbalized understanding of the above and had no further questions upon completion of the visit.      Magdalen Spatz, NP 07/17/2021

## 2021-07-19 ENCOUNTER — Telehealth: Payer: Self-pay | Admitting: Student-PharmD

## 2021-07-19 NOTE — Telephone Encounter (Signed)
Crystal [name] is a 70 y.o. female and was referred to the pharmacist telephone-based smoking cessation service by pulmonologist Dr. Annamaria Boots.  At last telephone visit on 06/28/21, patient expressed readiness to quit and set quit date of October 1st. Made the plan to start nicotine 14 mg patches and 2 mg gum on quit date. Today, patient reports that she is still on track to quit smoking tomorrow 10/1. She has worked to decrease her smoking since we talked last from 20 cigarettes per day to 5 cigarettes/day. She has worked on this by slowly decreasing each day how much she allows herself to smoke. She started using the nicotine patches on Tuesday of this week with the 21 mg since she had those on hand. The next day she noticed a raised/itchy/red spot where the patch was. She put some Benadryl cream on it and it improved. She has rotated sites since then and with each day she uses it, the site fades and is not as itchy/red as before so she thinks maybe her body was just getting used to using them again. She says these are not bothersome and are improving.   Current tobacco use as of 07/19/21: 5 cigarettes/day.   Tobacco Use History -Current tobacco use as of 06/28/21: 20 cigarettes/day (1 ppd) -Time to first cigarette: < 30 minutes -Started smoking at 70 years old   Quit Attempt History  -Have you tried to quit in the past? Yes . Quit smoking for 11 months previously, but when mother moved in with her from the nursing home she started smoking again from the stress, and then her mother passed away 3 months later. Longest time other than that was 6 or 8 weeks about 20 years ago.  -Most recent quit attempt: February 2017 - January 2018  -Longest time ever been tobacco free: 11 months -What helped? In 2017 she used [facility] smoking cessation program. Her target quit date at that time was the anniversary of husband's death (died at 62 of massive heart attack in 2008). She used to smoke 2 ppd when her  husband was alive (currently 1 ppd). At that time, she used nicotine gum and patches, which worked really well for her. She also would keep a paper on her wall in her office that she would write the number of days it had been since she had quit which she found very motivating.  -What was difficult? Stress of 24/7 care of mother followed by her mother's passing   Tobacco Use Habits: -Triggers include getting together with a group that smokes. Working at home (works 1-2 hours, takes break to go smoke). More of a habit. Excitement - celebrating accomplishment/getting work done. Smoking when driving - doesn't bring cigarettes in her car anymore so she has stopped this. She gets edgy if she haven't smoked in a while. At least 1.5-2 hours when she starts craving. In the morning she smokes 2 or 3 cigarettes in the first hour and a half of waking up. Then it's usually 1 cigarette about every hour through the day.  -Does not wake at night to smoke. Has cravings but doesn't get up to do it. Reports she hasn't done that in years.  -Alcohol use: Drinks 1 glass of wine/night, fifth of vodka in a week. When drinking, she smokes more.  -Other smokers in household or daily life: None (lives alone). Daughter smokes but lives in [place]. Son vapes.  Identify social support: Siblings are her biggest supporters (2 brothers, 2  sisters). Older brother has quit for >30 years now and encourages her to smoke. He is her only sibling that is local to [place]. Best friend - she and her husband quit smoking 15 years ago - could reach out to her or her husband for support.    On a scale of 1-10, how CONFIDENT are you that you will successfully quit: 9 (8-10)  -Barriers/concerns: Boredom - finding something to replace time with.    On a scale of 1-10, how IMPORTANT is it to you that you quit: 10 -Motivators: She is getting braces soon to correct her teeth/bite that have shifted since using a sleep apnea mouth piece, financial (spend  $250/month on cigarettes), she would love to surprise her brother that she has quit smoking, health and longevity (husband died from a heart attack and was a heavy smoker, grandsons are 33 and 5 and she wants to be around to watch them graduate high school and college)    Past pharmacotherapy trials:  [x]  Nicotine gum: worked well, quit x 11 months []  Nicotine lozenge [x]  Nicotine patch: worked well, quit x 11 months []  Nicotine inhaler []  Nicotine nasal spray []  Bupropion (Zyban) [x]  Varenicline (Chantix): Tried Chantix once at a difficult time in life (work, husband, finances), found that it did not work well, had more "blue spells."   Current Outpatient Medications  Medication Instructions   albuterol (VENTOLIN HFA) 108 (90 Base) MCG/ACT inhaler 1-2 puffs, Inhalation, Every 4 hours PRN   aspirin EC 81 mg, Oral, 2 times daily after meals, Swallow whole.   Calcium Carb-Cholecalciferol 600-800 MG-UNIT TABS 1 tablet, Oral, 2 times daily   Cholecalciferol (VITAMIN D PO) 2,000 Units, Oral, Daily   denosumab (PROLIA) 60 mg, Subcutaneous, Every 6 months   folic acid (FOLVITE) 664 mcg, Oral, Daily   Gemtesa 75 mg, Oral, Daily   losartan (COZAAR) 50 MG tablet TAKE 1 TABLET EVERY DAY (ANNUAL APPOINTMENT DUE IN MAY, MUST SEE MD FOR FUTURE REFILLS)   metoprolol succinate (TOPROL-XL) 25 MG 24 hr tablet TAKE 1 TABLET BY MOUTH EVERY DAY   vitamin B-12 (CYANOCOBALAMIN) 1,000 mcg, Oral, Daily    Assessment/Plan: Patient has decreased tobacco use from prior, congratulated on progress. Currently using 21 mg nicotine patch for smoking cessation pharmacotherapy. She also has gum on hand for cravings. Reports adverse effects of itchiness/redness from patch but states this is not bothersome and is improving with each day she uses the patches. Asked her to let us know if this does become a problem or gets worse and we will re-evaluate. She could switch to using mostly the gum if needed or try a decreased dose of  the patch though it may be the adhesive that is what is bothering her. Discussed triggers, barriers, and motivators to quitting. She plans to keep a count in her office of how many days it has been since quitting smoking.   Treatment was reviewed with the patient including name, instructions, goals of therapy, potential adverse effects including mild itching or redness at the point of application, headache, trouble sleeping, and/or vivid dreams.   The following counseling was provided: [x]  Anticipated nicotine withdrawal symptoms [x]  Coping skills/strategies [x]  Information on 1-800-QUITNOW support program [x]  Tell family and friends about quitting [x]  Stress management [x]  Remove tobacco products (cigarettes, lighters, ash trays) by tomorrow, 10/1.   Patient was advised to contact [facility] if questions/concerns arise. Patient verbalized understanding of information.  Follow up in 1 week by phone to see how  she is doing after quitting tomorrow.   Time spent: 10 minutes  Rebbeca Paul, PharmD PGY2 Ambulatory Care Pharmacy Resident 07/19/2021 3:18 PM

## 2021-07-25 ENCOUNTER — Telehealth: Payer: Self-pay | Admitting: Student-PharmD

## 2021-07-25 NOTE — Telephone Encounter (Signed)
Prior Auth required for Prolia  PA PROCESS DETAILS: PA is required. PA can be initiated by calling [phone] or online at [email or web address]

## 2021-07-25 NOTE — Telephone Encounter (Signed)
Crystal [name] is a 70 y.o. female and was referred to the pharmacist telephone-based smoking cessation service by pulmonologist Dr. Annamaria Boots.   At last telephone visit on 07/19/21, patient was on track to quit smoking on 10/1 and had decreased from 20 to 5 cigarettes/day. She had started wearing the 21 mg patches and noticed some skin irritation at the site but rotates locations and it was getting better with time and Benadryl cream. I spoke with her today and she officially quit on 10/1. She keeps a board in her office with the count of how many days she has gone without smoking. She stopped using the patches Tuesday because it was causing too much skin irritation. When she used patches in the past she used [employer] brand (current ones are [employer] brand) and she had mild skin irritation but not this bad. She has been chewing lots of gum to make up for it but notices she does have more cravings without the patch but pushes through them. She has successfully not smoked any cigarettes since quitting.   Current tobacco use as of 07/25/21: None (quit 10/1)   Tobacco Use History -Current tobacco use as of 06/28/21: 20 cigarettes/day (1 ppd) -Time to first cigarette: < 30 minutes -Started smoking at 70 years old   Quit Attempt History  -Have you tried to quit in the past? Yes . Quit smoking for 11 months previously, but when mother moved in with her from the nursing home she started smoking again from the stress, and then her mother passed away 3 months later. Longest time other than that was 6 or 8 weeks about 20 years ago.  -Most recent quit attempt: February 2017 - January 2018  -Longest time ever been tobacco free: 11 months -What helped? In 2017 she used [facility] smoking cessation program. Her target quit date at that time was the anniversary of husband's death (died at 10 of massive heart attack in 2008). She used to smoke 2 ppd when her husband was alive (currently 1 ppd). At that time, she used  nicotine gum and patches, which worked really well for her. She also would keep a paper on her wall in her office that she would write the number of days it had been since she had quit which she found very motivating.  -What was difficult? Stress of 24/7 care of mother followed by her mother's passing   Tobacco Use Habits: -Triggers include getting together with a group that smokes. Working at home (works 1-2 hours, takes break to go smoke). More of a habit. Excitement - celebrating accomplishment/getting work done. Smoking when driving - doesn't bring cigarettes in her car anymore so she has stopped this. She gets edgy if she haven't smoked in a while. At least 1.5-2 hours when she starts craving. In the morning she smokes 2 or 3 cigarettes in the first hour and a half of waking up. Then it's usually 1 cigarette about every hour through the day.  -Does not wake at night to smoke. Has cravings but doesn't get up to do it. Reports she hasn't done that in years.  -Alcohol use: Drinks 1 glass of wine/night, fifth of vodka in a week. When drinking, she smokes more.  -Other smokers in household or daily life: None (lives alone). Daughter smokes but lives in [place]. Son vapes.  Identify social support: Siblings are her biggest supporters (2 brothers, 2 sisters). Older brother has quit for >30 years now and encourages her to smoke. He  is her only sibling that is local to [place]. Best friend - she and her husband quit smoking 15 years ago - could reach out to her or her husband for support.    On a scale of 1-10, how CONFIDENT are you that you will successfully quit: 9 (8-10)  -Barriers/concerns: Boredom - finding something to replace time with.    On a scale of 1-10, how IMPORTANT is it to you that you quit: 10 -Motivators: She is getting braces soon to correct her teeth/bite that have shifted since using a sleep apnea mouth piece, financial (spend $250/month on cigarettes), she would love to surprise her brother  that she has quit smoking, health and longevity (husband died from a heart attack and was a heavy smoker, grandsons are 38 and 5 and she wants to be around to watch them graduate high school and college)    Past pharmacotherapy trials:  [x]  Nicotine gum: worked well, quit x 11 months []  Nicotine lozenge [x]  Nicotine patch: worked well, quit x 11 months []  Nicotine inhaler []  Nicotine nasal spray []  Bupropion (Zyban) [x]  Varenicline (Chantix): Tried Chantix once at a difficult time in life (work, husband, finances), found that it did not work well, had more "blue spells."   Current Outpatient Medications  Medication Instructions   albuterol (VENTOLIN HFA) 108 (90 Base) MCG/ACT inhaler 1-2 puffs, Inhalation, Every 4 hours PRN   aspirin EC 81 mg, Oral, 2 times daily after meals, Swallow whole.   Calcium Carb-Cholecalciferol 600-800 MG-UNIT TABS 1 tablet, Oral, 2 times daily   Cholecalciferol (VITAMIN D PO) 2,000 Units, Oral, Daily   denosumab (PROLIA) 60 mg, Subcutaneous, Every 6 months   folic acid (FOLVITE) 326 mcg, Oral, Daily   Gemtesa 75 mg, Oral, Daily   losartan (COZAAR) 50 MG tablet TAKE 1 TABLET EVERY DAY (ANNUAL APPOINTMENT DUE IN MAY, MUST SEE MD FOR FUTURE REFILLS)   metoprolol succinate (TOPROL-XL) 25 MG 24 hr tablet TAKE 1 TABLET BY MOUTH EVERY DAY   vitamin B-12 (CYANOCOBALAMIN) 1,000 mcg, Oral, Daily    Assessment/Plan: Patient has quit smoking as of 07/20/21. Congratulated her on this significant progress! Discussed that she could try the [employer] brand of patches at a lower dose (7 or 14 mg) if she finds the cravings are too bad with the gum alone, to see if this brand irritates her skin less since she had success with it in the past. She will continue to evaluate how she is doing but for now will continue with the gum and try another patch brand if needed.   The following counseling was provided: [x]  Anticipated nicotine withdrawal symptoms [x]  Coping skills/strategies [x]   Information on 1-800-QUITNOW support program [x]  Tell family and friends about quitting [x]  Stress management  Patient was advised to contact [facility] Clinic if questions/concerns arise. Patient verbalized understanding of information.  Follow up in 1 week.   Time spent: 5 minutes  Rebbeca Paul, PharmD PGY2 Ambulatory Care Pharmacy Resident 07/25/2021 1:24 PM

## 2021-07-29 ENCOUNTER — Other Ambulatory Visit: Payer: Self-pay

## 2021-07-29 ENCOUNTER — Ambulatory Visit (INDEPENDENT_AMBULATORY_CARE_PROVIDER_SITE_OTHER): Payer: Medicare HMO

## 2021-07-29 DIAGNOSIS — M810 Age-related osteoporosis without current pathological fracture: Secondary | ICD-10-CM

## 2021-07-29 MED ORDER — DENOSUMAB 60 MG/ML ~~LOC~~ SOSY
60.0000 mg | PREFILLED_SYRINGE | Freq: Once | SUBCUTANEOUS | Status: AC
  Administered 2021-07-29: 60 mg via SUBCUTANEOUS

## 2021-07-29 NOTE — Telephone Encounter (Signed)
PA initiated via [email or web address] KEY: [id number]

## 2021-07-31 NOTE — Telephone Encounter (Signed)
Prior auth APPROVED ° ° ° ° °

## 2021-07-31 NOTE — Telephone Encounter (Signed)
Last Prolia inj 07/29/21 Next Prolia inj due 01/28/22

## 2021-07-31 NOTE — Telephone Encounter (Signed)
Pt ready for scheduling on or after 07/26/21  Out-of-pocket cost due at time of visit: $280  Primary: [employer] Prolia co-insurance: 20% (approximately $255) Admin fee co-insurance: 20% (approximately $25)  Secondary: n/a Prolia co-insurance:  Admin fee co-insurance:   Deductible: does not apply  Prior Auth: APPROVED PA# [id number] Valid: 10/20/21-10/19/22  ** This summary of benefits is an estimation of the patient's out-of-pocket cost. Exact cost may vary based on individual plan coverage.

## 2021-08-01 ENCOUNTER — Telehealth: Payer: Self-pay | Admitting: Student-PharmD

## 2021-08-01 NOTE — Telephone Encounter (Addendum)
Crystal [name] is a 70 y.o. female and was referred to the pharmacist telephone-based smoking cessation service by pulmonologist Dr. Annamaria Boots.  At last telephone visit on 10/6, she had not smoked since quitting on 10/1. She reported stopping the patches earlier that week because it was causing skin irritation. She did not previously experience that when she previously used the [employer] brand of patches years ago (these were [employer]). She was using the gum only and doing well and working to push through the cravings.   Today she reports she had a moment of anger and feeling bad about herself and bought a pack of cigarettes. She smoked 1/2 of the pack on Tues/Wednesday this week and threw the rest away and has not smoked since 10/13. She notes that not smoking when she first wakes up and with her coffee was easier to get rid of than she thought. It has been the afternoon while she has a glass of wine and starts cooking that has been the hardest temptation and is when she had her moment of indiscretion this week. Since our last talk she also tried the [employer] brand of patches (7 mg) and had the same skin reaction so she is going to avoid patches altogether. She also bought the lozenges and has found that they have a longer effect than the gum so she has been using them more though it does dry her throat out a little bit. She is scheduled to receive her CPAP machine on Tuesday and hoping to get braces then in the next month at which point she will no longer use any gum.   Current tobacco use as of 08/02/21: Smoked 1/2 pack Tues/Wed this week, hasn't had any since 10/13  Tobacco Use History -Current tobacco use as of 06/28/21: 20 cigarettes/day (1 ppd) -Time to first cigarette: < 30 minutes -Started smoking at 70 years old   Quit Attempt History  -Have you tried to quit in the past? Yes. Quit smoking for 11 months previously, but when mother moved in with her from the nursing home she started smoking  again from the stress, and then her mother passed away 3 months later. Longest time other than that was 6 or 8 weeks about 20 years ago.  -Most recent quit attempt: February 2017 - January 2018  -Longest time ever been tobacco free: 11 months -What helped? In 2017 she used [facility] smoking cessation program. Her target quit date at that time was the anniversary of husband's death (died at 8 of massive heart attack in 2008). She used to smoke 2 ppd when her husband was alive (currently 1 ppd). At that time, she used nicotine gum and patches, which worked really well for her. She also would keep a paper on her wall in her office that she would write the number of days it had been since she had quit which she found very motivating.  -What was difficult? Stress of 24/7 care of mother followed by her mother's passing  Tobacco Use Habits: -Triggers include getting together with a group that smokes. Working at home (works 1-2 hours, takes break to go smoke). More of a habit. Excitement - celebrating accomplishment/getting work done. Smoking when driving - doesn't bring cigarettes in her car anymore so she has stopped this. She gets edgy if she haven't smoked in a while. At least 1.5-2 hours when she starts craving. In the morning she smokes 2 or 3 cigarettes in the first hour and a half of  waking up. Then it's usually 1 cigarette about every hour through the day.  -Does not wake at night to smoke. Has cravings but doesn't get up to do it. Reports she hasn't done that in years.  -Alcohol use: Drinks 1 glass of wine/night, fifth of vodka in a week. When drinking, she smokes more.  -Other smokers in household or daily life: None (lives alone). Daughter smokes but lives in [place]. Son vapes.  Identify social support: Siblings are her biggest supporters (2 brothers, 2 sisters). Older brother has quit for >30 years now and encourages her to smoke. He is her only sibling that is local to [place]. Best friend - she and  her husband quit smoking 15 years ago - could reach out to her or her husband for support.   On a scale of 1-10, how CONFIDENT are you that you will successfully quit: 9 (8-10)  -Barriers/concerns: Boredom - finding something to replace time with.   On a scale of 1-10, how IMPORTANT is it to you that you quit: 10 -Motivators: She is getting braces soon to correct her teeth/bite that have shifted since using a sleep apnea mouth piece, financial (spend $250/month on cigarettes), she would love to surprise her brother that she has quit smoking, health and longevity (husband died from a heart attack and was a heavy smoker, grandsons are 69 and 5 and she wants to be around to watch them graduate high school and college)    Past pharmacotherapy trials:  [x]  Nicotine gum: worked well, quit x 11 months []  Nicotine lozenge [x]  Nicotine patch: worked well, quit x 11 months; during current quit attempt, experiences skin reactions with multiple brands and dose strengths []  Nicotine inhaler []  Nicotine nasal spray []  Bupropion (Zyban) [x]  Varenicline (Chantix): Tried Chantix once at a difficult time in life (work, husband, finances), found that it did not work well, had more "blue spells."   Current Outpatient Medications  Medication Instructions   albuterol (VENTOLIN HFA) 108 (90 Base) MCG/ACT inhaler 1-2 puffs, Inhalation, Every 4 hours PRN   aspirin EC 81 mg, Oral, 2 times daily after meals, Swallow whole.   Calcium Carb-Cholecalciferol 600-800 MG-UNIT TABS 1 tablet, Oral, 2 times daily   Cholecalciferol (VITAMIN D PO) 2,000 Units, Oral, Daily   denosumab (PROLIA) 60 mg, Subcutaneous, Every 6 months   folic acid (FOLVITE) 062 mcg, Oral, Daily   Gemtesa 75 mg, Oral, Daily   losartan (COZAAR) 50 MG tablet TAKE 1 TABLET EVERY DAY (ANNUAL APPOINTMENT DUE IN MAY, MUST SEE MD FOR FUTURE REFILLS)   metoprolol succinate (TOPROL-XL) 25 MG 24 hr tablet TAKE 1 TABLET BY MOUTH EVERY DAY   vitamin B-12  (CYANOCOBALAMIN) 1,000 mcg, Oral, Daily    Assessment/Plan: [name] had a small relapse of smoking again this week with 1/2 pack after initially quitting 10/1. Now has not smoked since 10/13. We discussed how to manage stress during these moments and ideas of what she can do during those times to get her thoughts off buying cigarettes. Also discussed importance of giving herself grace when these slip ups occur. Congratulated her on continuing her quit attempt. Encouraged her to change her mindset - think of herself as a nonsmoker now, that buying a pack of cigarettes is no longer an option for her because she doesn't smoke. She said she will remind herself that she is quitting not as a punishment to herself but to improve her health and better herself. She will continue using nicotine gum and lozenge  as needed until she gets braces then switch to only lozenges.   The following counseling was provided: [x]  Anticipated nicotine withdrawal symptoms [x]  Coping skills/strategies [x]  Information on 1-800-QUITNOW support program [x]  Tell family and friends about quitting [x]  Stress management  Patient was advised to contact Pulmonary Clinic if questions/concerns arise. Patient verbalized understanding of information.  Follow up phone call 1 week.   Time spent: 25 mins  Rebbeca Paul, PharmD PGY2 Ambulatory Care Pharmacy Resident 08/02/2021 4:33 PM

## 2021-08-02 ENCOUNTER — Encounter: Payer: Self-pay | Admitting: *Deleted

## 2021-08-02 DIAGNOSIS — Z87891 Personal history of nicotine dependence: Secondary | ICD-10-CM

## 2021-08-02 DIAGNOSIS — F17210 Nicotine dependence, cigarettes, uncomplicated: Secondary | ICD-10-CM

## 2021-08-07 DIAGNOSIS — G4733 Obstructive sleep apnea (adult) (pediatric): Secondary | ICD-10-CM | POA: Diagnosis not present

## 2021-08-08 ENCOUNTER — Telehealth: Payer: Self-pay | Admitting: Student-PharmD

## 2021-08-08 DIAGNOSIS — G4733 Obstructive sleep apnea (adult) (pediatric): Secondary | ICD-10-CM | POA: Diagnosis not present

## 2021-08-08 NOTE — Telephone Encounter (Signed)
Crystal [name] is a 70 y.o. female and was referred to the pharmacist telephone-based smoking cessation service by pulmonologist Dr. Annamaria Boots.  At last telephone visit on 10/13, she had a slip in her quit attempt and smoked 1/2 pack of cigarettes. New quit date of 10/13 and she has not smoked since then (today is day 8). She keeps a count of how many days she goes without smoking as motivation. She is also motivated to be able to surprise her brother on Thanksgiving that she has quit smoking. She continues to use lozenges and gum to help though she thinks it is more of a habit than nicotine dependence that is the hardest part. She was at a birthday party over the weekend with a lot of smokers and she isolated herself with those who were not smoking and successfully did not smoke. She is working on ways to relieve stress besides smoking. She colors in a relaxing coloring book and is also practicing her cursive in a workbook and this has helped to distract her and calm her. She received her CPAP machine yesterday and used it last night - she slept for 7.5 hours.   Current tobacco use as of 08/08/21: None (quit 08/01/21)  Tobacco Use History -Current tobacco use as of 06/28/21: 20 cigarettes/day (1 ppd) -Time to first cigarette: < 30 minutes -Started smoking at 70 years old   Quit Attempt History  -Have you tried to quit in the past? Yes. Quit smoking for 11 months previously, but when mother moved in with her from the nursing home she started smoking again from the stress, and then her mother passed away 3 months later. Longest time other than that was 6 or 8 weeks about 20 years ago.  -Most recent quit attempt: February 2017 - January 2018  -Longest time ever been tobacco free: 11 months -What helped? In 2017 she used [place] smoking cessation program. Her target quit date at that time was the anniversary of husband's death (died at 20 of massive heart attack in 2008). She used to smoke 2 ppd  when her husband was alive (currently 1 ppd). At that time, she used nicotine gum and patches, which worked really well for her. She also would keep a paper on her wall in her office that she would write the number of days it had been since she had quit which she found very motivating.  -What was difficult? Stress of 24/7 care of mother followed by her mother's passing  Tobacco Use Habits: -Triggers include getting together with a group that smokes. Working at home (works 1-2 hours, takes break to go smoke). More of a habit. Excitement - celebrating accomplishment/getting work done. Smoking when driving - doesn't bring cigarettes in her car anymore so she has stopped this. She gets edgy if she haven't smoked in a while. At least 1.5-2 hours when she starts craving. In the morning she smokes 2 or 3 cigarettes in the first hour and a half of waking up. Then it's usually 1 cigarette about every hour through the day.  -Does not wake at night to smoke. Has cravings but doesn't get up to do it. Reports she hasn't done that in years.  -Alcohol use: Drinks 1 glass of wine/night, fifth of vodka in a week. When drinking, she smokes more.  -Other smokers in household or daily life: None (lives alone). Daughter smokes but lives in [place]. Son vapes.  Identify social support: Siblings are her biggest supporters (2 brothers,  2 sisters). Older brother has quit for >30 years now and encourages her to smoke. He is her only sibling that is local to [place]. Best friend - she and her husband quit smoking 15 years ago - could reach out to her or her husband for support.   On a scale of 1-10, how CONFIDENT are you that you will successfully quit: 9 (8-10)  -Barriers/concerns: Boredom - finding something to replace time with.   On a scale of 1-10, how IMPORTANT is it to you that you quit: 10 -Motivators: She is getting braces soon to correct her teeth/bite that have shifted since using a sleep apnea mouth piece, financial (spend  $250/month on cigarettes), she would love to surprise her brother that she has quit smoking, health and longevity (husband died from a heart attack and was a heavy smoker, grandsons are 48 and 5 and she wants to be around to watch them graduate high school and college)    Past pharmacotherapy trials:  [x]  Nicotine gum: worked well, quit x 11 months []  Nicotine lozenge [x]  Nicotine patch: worked well, quit x 11 months; during current quit attempt, experiences skin reactions with multiple brands and dose strengths []  Nicotine inhaler []  Nicotine nasal spray []  Bupropion (Zyban) [x]  Varenicline (Chantix): Tried Chantix once at a difficult time in life (work, husband, finances), found that it did not work well, had more "blue spells."   Current Outpatient Medications  Medication Instructions   albuterol (VENTOLIN HFA) 108 (90 Base) MCG/ACT inhaler 1-2 puffs, Inhalation, Every 4 hours PRN   aspirin EC 81 mg, Oral, 2 times daily after meals, Swallow whole.   Calcium Carb-Cholecalciferol 600-800 MG-UNIT TABS 1 tablet, Oral, 2 times daily   Cholecalciferol (VITAMIN D PO) 2,000 Units, Oral, Daily   denosumab (PROLIA) 60 mg, Subcutaneous, Every 6 months   folic acid (FOLVITE) 440 mcg, Oral, Daily   Gemtesa 75 mg, Oral, Daily   losartan (COZAAR) 50 MG tablet TAKE 1 TABLET EVERY DAY (ANNUAL APPOINTMENT DUE IN MAY, MUST SEE MD FOR FUTURE REFILLS)   metoprolol succinate (TOPROL-XL) 25 MG 24 hr tablet TAKE 1 TABLET BY MOUTH EVERY DAY   vitamin B-12 (CYANOCOBALAMIN) 1,000 mcg, Oral, Daily    Assessment/Plan: Crystal [name] has successfully quit smoking for 8 days. We discussed stress management and resetting her mindset to think of herself as a nonsmoker, that buying a pack of cigarettes is no longer an option for her because she doesn't smoke, so she needs to manage her stress in another way. She is working on this and seems to be doing well. I congratulated her on her success thus far and encouraged her to think  of how happy her brother would be if on Thanksgiving in 5 weeks she can tell him that she has been smoke free for this long. She says she will use that to motivate her to keep working toward it in moments of difficulty. She will continue using the nicotine gum and lozenges as needed.   The following counseling was provided: [x]  Anticipated nicotine withdrawal symptoms [x]  Coping skills/strategies [x]  Information on 1-800-QUITNOW support program [x]  Tell family and friends about quitting [x]  Stress management  Patient was advised to contact [facility] if questions/concerns arise. Patient verbalized understanding of information.  Follow up phone call 1 week.   Time spent: 15 mins  Rebbeca Paul, PharmD PGY2 Ambulatory Care Pharmacy Resident 08/08/2021 3:44 PM

## 2021-08-09 NOTE — Progress Notes (Signed)
Patient given Prolia today in office. Patient tolerated injection well with no side effects.

## 2021-08-15 ENCOUNTER — Telehealth: Payer: Self-pay | Admitting: Student-PharmD

## 2021-08-15 NOTE — Telephone Encounter (Signed)
Crystal [name] is a 70 y.o. female and was referred to the pharmacist telephone-based smoking cessation service by pulmonologist Dr. Annamaria Boots.  Crystal [name] has successfully quit smoking as of 10/13. She experienced skin irritation with multiple brands of nicotine patches, so she is currently using a combination of nicotine gum and lozenges throughout the day. She feels that the lozenges last a little longer than the gum. She finds that it is the hardest to continue not smoking when she experiences a burst of anger or short temper. However, she did have an instance of this happen on Tuesday and, while the previous time this happened at the beginning of the month she bought a back of cigarettes, she successfully did not return to smoking this time. She is still using stress management techniques like coloring in a coloring book and practicing cursive. She writes her mantra "I choose to be smoke free now" throughout the day in cursive. She continues with her goal of quitting through Thanksgiving so she can surprise her brother then but notes that the day after Thanksgiving she is having her son's family over who smokes and that is going to be the hardest challenge so far.   Current tobacco use as of 08/08/21: None (quit 08/01/21)  Tobacco Use History -Baseline tobacco use as of 06/28/21: 20 cigarettes/day (1 ppd) -Time to first cigarette: < 30 minutes -Started smoking at 70 years old   Quit Attempt History  -Have you tried to quit in the past? Yes. Quit smoking for 11 months previously, but when mother moved in with her from the nursing home she started smoking again from the stress, and then her mother passed away 3 months later. Longest time other than that was 6 or 8 weeks about 20 years ago.  -Most recent quit attempt: February 2017 - January 2018  -Longest time ever been tobacco free: 11 months -What helped? In 2017 she used [place] smoking cessation program. Her target quit date at that time was  the anniversary of husband's death (died at 28 of massive heart attack in 2008). She used to smoke 2 ppd when her husband was alive (currently 1 ppd). At that time, she used nicotine gum and patches, which worked really well for her. She also would keep a paper on her wall in her office that she would write the number of days it had been since she had quit which she found very motivating.  -What was difficult? Stress of 24/7 care of mother followed by her mother's passing   Tobacco Use Habits: -Triggers include getting together with a group that smokes. Working at home (works 1-2 hours, takes break to go smoke). More of a habit. Excitement - celebrating accomplishment/getting work done. Smoking when driving - doesn't bring cigarettes in her car anymore so she has stopped this. She gets edgy if she haven't smoked in a while. At least 1.5-2 hours when she starts craving. In the morning she smokes 2 or 3 cigarettes in the first hour and a half of waking up. Then it's usually 1 cigarette about every hour through the day.  -Does not wake at night to smoke. Has cravings but doesn't get up to do it. Reports she hasn't done that in years.  -Alcohol use: Drinks 1 glass of wine/night, fifth of vodka in a week. When drinking, she smokes more.  -Other smokers in household or daily life: None (lives alone). Daughter smokes but lives in [place]. Son vapes.  Identify social support: Siblings are  her biggest supporters (2 brothers, 2 sisters). Older brother has quit for >30 years now and encourages her to smoke. He is her only sibling that is local to [place]. Best friend - she and her husband quit smoking 15 years ago - could reach out to her or her husband for support.   On a scale of 1-10, how CONFIDENT are you that you will successfully quit: 9 (8-10)  -Barriers/concerns: Boredom - finding something to replace time with.   On a scale of 1-10, how IMPORTANT is it to you that you quit: 10 -Motivators: She is getting  braces soon to correct her teeth/bite that have shifted since using a sleep apnea mouth piece, financial (spend $250/month on cigarettes), she would love to surprise her brother that she has quit smoking, health and longevity (husband died from a heart attack and was a heavy smoker, grandsons are 48 and 5 and she wants to be around to watch them graduate high school and college)    Past pharmacotherapy trials:  [x]  Nicotine gum: worked well, quit x 11 months []  Nicotine lozenge [x]  Nicotine patch: worked well, quit x 11 months; during current quit attempt, experiences skin reactions with multiple brands and dose strengths []  Nicotine inhaler []  Nicotine nasal spray []  Bupropion (Zyban) [x]  Varenicline (Chantix): Tried Chantix once at a difficult time in life (work, husband, finances), found that it did not work well, had more "blue spells."   Current Outpatient Medications  Medication Instructions   albuterol (VENTOLIN HFA) 108 (90 Base) MCG/ACT inhaler 1-2 puffs, Inhalation, Every 4 hours PRN   aspirin EC 81 mg, Oral, 2 times daily after meals, Swallow whole.   Calcium Carb-Cholecalciferol 600-800 MG-UNIT TABS 1 tablet, Oral, 2 times daily   Cholecalciferol (VITAMIN D PO) 2,000 Units, Oral, Daily   denosumab (PROLIA) 60 mg, Subcutaneous, Every 6 months   folic acid (FOLVITE) 536 mcg, Oral, Daily   Gemtesa 75 mg, Oral, Daily   losartan (COZAAR) 50 MG tablet TAKE 1 TABLET EVERY DAY (ANNUAL APPOINTMENT DUE IN MAY, MUST SEE MD FOR FUTURE REFILLS)   metoprolol succinate (TOPROL-XL) 25 MG 24 hr tablet TAKE 1 TABLET BY MOUTH EVERY DAY   vitamin B-12 (CYANOCOBALAMIN) 1,000 mcg, Oral, Daily    Assessment/Plan: Crystal [name] has successfully quit smoking since 08/01/21. Today is day 15, as she keeps count on a piece of paper in her office. Congratulated her on this success so far and with not resorting to smoking during an especially difficult day this week. Encouraged her to continue working on stress  management techniques. We discussed that building the habit of smoking didn't happen overnight and the habit of quitting smoking takes time as well. She will continue using the nicotine gum and lozenges as needed. We will touch base again next week.   The following counseling was provided: [x]  Anticipated nicotine withdrawal symptoms [x]  Coping skills/strategies [x]  Information on 1-800-QUITNOW support program [x]  Tell family and friends about quitting [x]  Stress management  Patient was advised to contact Pulmonary Clinic if questions/concerns arise. Patient verbalized understanding of information.  Follow up phone call 1 week.   Time spent: 15 mins  Rebbeca Paul, PharmD PGY2 Ambulatory Care Pharmacy Resident 08/15/2021 4:25 PM

## 2021-08-16 ENCOUNTER — Telehealth: Payer: Self-pay | Admitting: Internal Medicine

## 2021-08-16 NOTE — Telephone Encounter (Signed)
Patient calling in  Patient says she spoke w/ her insurance company & they advised her that there was no record of a PA for her Prolia injection she received 07/29/21 in the office.. says they have PA covered for 2023 but not 2022  Patient is requesting claim be submitted to insurance as soon as possible so she can receive her EOB  Please call patient [phone]

## 2021-08-16 NOTE — Progress Notes (Signed)
Patient ID: Crystal [name], female   DOB: August 10, 1951, 70 y.o.   MRN: [id number]  Medical screening examination/treatment/procedure(s) were performed by non-physician practitioner and as supervising physician I was immediately available for consultation/collaboration.  I agree with above. Cathlean Cower, MD

## 2021-08-22 ENCOUNTER — Telehealth: Payer: Self-pay | Admitting: Student-PharmD

## 2021-08-22 NOTE — Progress Notes (Signed)
[facility] [place] Phone: [phone] Subjective:   [name], am serving as a scribe for Dr. Hulan Saas. This visit occurred during the SARS-CoV-2 public health emergency.  Safety protocols were in place, including screening questions prior to the visit, additional usage of staff PPE, and extensive cleaning of exam room while observing appropriate contact time as indicated for disinfecting solutions.   I'm seeing this patient by the request  of:  Biagio Borg, MD  CC: knee pain   SHF:[id number]  06/03/2021 Degenerative arthritis with a Baker's cyst.  Discussed with patient about continuing to monitor.  Patient wants to hold on any type of injection at this time.  Does not want any surgical intervention.  Follow-up with me again in 2 to 3 months  Updated 08/26/2021 Crystal [name] is a 70 y.o. female coming in with complaint of L knee pain. Tenderness in postieorr aspect of L knee for past 10 days.  Patient's left knee does have some degenerative changes noted.  Patient has had some instability she feels but nothing that has stopped her from activity.  Has not used anything for pain medication wise.  Patient noted does state that it is definitely worsening since previous exam again.      Past Medical History:  Diagnosis Date   ALLERGIC RHINITIS    Allergy    ANXIETY    no medations needed   Arthritis    Asthma    Bronchitis    Cataract    no surgery   COLONIC POLYPS, HX OF    Complication of anesthesia    oxygen sat dropped during endoscopy    COPD    pt unsure of this dx   Cough    DEPRESSION    Fatty liver    GERD    GLUCOSE INTOLERANCE    HYPERTENSION    IBS    Impaired glucose tolerance    Obstructive sleep apnea 06/25/2018   OSTEOPENIA    Osteopenia    Osteoporosis 07/30/2017   PERIPHERAL EDEMA    Pneumonia    Pneumonia, organism unspecified(486)    Sleep apnea    Oral Aparatus    TOBACCO USE DISORDER/SMOKER-SMOKING CESSATION DISCUSSED    Past Surgical History:  Procedure Laterality Date   birthmark removed     from back as a child   bladder mesh     2012   CESAREAN SECTION     x2   CHOLECYSTECTOMY     COLONOSCOPY  2018   TA   MOUTH SURGERY  04/25/2020   tissue graft -room of mouth to front gums   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   Procedure: LEFT TOTAL HIP ARTHROPLASTY ANTERIOR APPROACH;  Surgeon: Melrose Nakayama, MD;  Location: [facility];  Service: Orthopedics;  Laterality: Left;   WISDOM TOOTH EXTRACTION     Social History   Socioeconomic History   Marital status: Widowed    Spouse name: Not on file   Number of children: 2   Years of education: Not on file   Highest education level: Not on file  Occupational History   Occupation: Occupational psychologist industries, signage for [employer]  Tobacco Use   Smoking status: Some Days    Packs/day: 1.00    Years: 42.00    Pack years: 42.00    Types: Cigarettes    Start date: 10/20/1977   Smokeless tobacco: Never  Vaping Use   Vaping  Use: Never used  Substance and Sexual Activity   Alcohol use: Yes    Alcohol/week: 21.0 standard drinks    Types: 7 Glasses of wine, 14 Standard drinks or equivalent per week    Comment: couple of drinks 1-2 nites per week    Drug use: No   Sexual activity: Not on file  Other Topics Concern   Not on file  Social History Narrative   Not on file   Social Determinants of Health   Financial Resource Strain: Not on file  Food Insecurity: Not on file  Transportation Needs: Not on file  Physical Activity: Not on file  Stress: Not on file  Social Connections: Not on file   Allergies  Allergen Reactions   Ace Inhibitors Cough   Penicillins Rash   Family History  Problem Relation Age of Onset   Breast cancer Sister 18       +lump and tamoxifen; eventually had bilateral mastectomies   Allergic rhinitis Sister    Colon cancer Father        dx. >78    Bladder Cancer Father        dx. 35-79; not a smoker   Skin cancer Father        basal cell carcinoma   Colon polyps Father    Colon cancer Paternal Grandfather        unspecified age   Melanoma Paternal Grandfather        unspecified age   Skin cancer Mother        NOS; dx. 26s   Other Mother        hx of hysterectomy in her early 33s for fibroid cysts   Heart attack Maternal Uncle 67   Heart attack Maternal Grandmother 62   Thyroid cancer Paternal Grandmother 88   Breast cancer Sister        dx. early 63s; s/p BL mastectomies   Cervical cancer Sister        dx. late 86s   Skin cancer Brother        NOS   Anxiety disorder Other    Hypothyroidism Other    Hyperlipidemia Other    Diabetes Other    Esophageal cancer Neg Hx    Stomach cancer Neg Hx    Rectal cancer Neg Hx     Current Outpatient Medications (Endocrine & Metabolic):    denosumab (PROLIA) 60 MG/ML SOSY injection, Inject 60 mg into the skin every 6 (six) months.  Current Outpatient Medications (Cardiovascular):    losartan (COZAAR) 50 MG tablet, TAKE 1 TABLET EVERY DAY (ANNUAL APPOINTMENT DUE IN MAY, MUST SEE MD FOR FUTURE REFILLS)   metoprolol succinate (TOPROL-XL) 25 MG 24 hr tablet, TAKE 1 TABLET BY MOUTH EVERY DAY  Current Outpatient Medications (Respiratory):    albuterol (VENTOLIN HFA) 108 (90 Base) MCG/ACT inhaler, Inhale 1-2 puffs into the lungs every 4 (four) hours as needed for wheezing or shortness of breath (coughing).  Current Outpatient Medications (Analgesics):    aspirin EC 81 MG tablet, Take 1 tablet (81 mg total) by mouth 2 (two) times daily after a meal. Swallow whole. (Patient taking differently: Take 81 mg by mouth as needed. Swallow whole.)  Current Outpatient Medications (Hematological):    folic acid (FOLVITE) 536 MCG tablet, Take 400 mcg by mouth daily.   vitamin B-12 (CYANOCOBALAMIN) 1000 MCG tablet, Take 1 tablet (1,000 mcg total) by mouth daily.  Current Outpatient Medications  (Other):    Calcium Carb-Cholecalciferol 600-800 MG-UNIT TABS, Take  1 tablet by mouth in the morning and at bedtime.    Cholecalciferol (VITAMIN D PO), Take 2,000 Units by mouth daily.    Vibegron (GEMTESA) 75 MG TABS, Take 75 mg by mouth daily.     Review of Systems:  No headache, visual changes, nausea, vomiting, diarrhea, constipation, dizziness, abdominal pain, skin rash, fevers, chills, night sweats, weight loss, swollen lymph nodes, body aches, joint swelling, chest pain, shortness of breath, mood changes. POSITIVE muscle aches  Objective  Blood pressure 130/84, pulse 92, height 5' 3"  (1.6 m), weight 192 lb (87.1 kg), SpO2 99 %.   General: No apparent distress alert and oriented x3 mood and affect normal, dressed appropriately.  HEENT: Pupils equal, extraocular movements intact  Respiratory: Patient's speak in full sentences and does not appear short of breath  Cardiovascular: No lower extremity edema, non tender, no erythema  Gait antalgic MSK: knee exam shows patient does have instability with valgus and varus force.  Patient does have an effusion noted of the patellofemoral joint.  Patient does have crepitus noted. Posterior popliteal area does have some small effusion noted.  Limited muscular skeletal ultrasound was performed and interpreted by Hulan Saas, M  Limited ultrasound of patient's left knee shows the patient does have an effusion with a synovitis of the patellofemoral joint with narrowing of the patellofemoral and lateral joint space moderate to severe in nature.  Patient does have a Baker's cyst noted but mild in size at the moment. Impression: Knee arthritis with effusion  After informed written and verbal consent, patient was seated on exam table. Left knee was prepped with alcohol swab and utilizing anterolateral approach, patient's left knee space was injected with 4:1  marcaine 0.5%: Kenalog 46m/dL. Patient tolerated the procedure well without immediate  complications.   Impression and Recommendations:     The above documentation has been reviewed and is accurate and complete ZLyndal Pulley DO

## 2021-08-23 NOTE — Telephone Encounter (Signed)
Crystal [name] is a 70 y.o. female and was referred to the pharmacist telephone-based smoking cessation service by pulmonologist Dr. Annamaria Boots.  Crystal [name] has successfully quit smoking as of 10/13. She experienced skin irritation with multiple brands of nicotine patches, so she is currently using a combination of nicotine gum and lozenges throughout the day. She feels that the lozenges last a little longer than the gum. She finds that it is the hardest to continue not smoking when she experiences a burst of anger or short temper.   I called Crystal [name] to follow up on how she is doing with quitting but was unable to reach her and LVM. She returned call and left a message for me stating that she is doing well and still has not smoked since her quit date 3 weeks ago. She "had a testy day yesterday and worked her way through it" and expects she will continue to have these for some time. She let me know that there is no need to call her back and I can just reach back out again next week to check in.   Current tobacco use as of 08/08/21: None (quit 08/01/21)  Tobacco Use History -Baseline tobacco use as of 06/28/21: 20 cigarettes/day (1 ppd) -Time to first cigarette: < 30 minutes -Started smoking at 70 years old   Quit Attempt History  -Have you tried to quit in the past? Yes. Quit smoking for 11 months previously, but when mother moved in with her from the nursing home she started smoking again from the stress, and then her mother passed away 3 months later. Longest time other than that was 6 or 8 weeks about 20 years ago.  -Most recent quit attempt: February 2017 - January 2018  -Longest time ever been tobacco free: 11 months -What helped? In 2017 she used [name]'s smoking cessation program. Her target quit date at that time was the anniversary of husband's death (died at 5 of massive heart attack in 2008). She used to smoke 2 ppd when her husband was alive (currently 1 ppd). At that time, she used  nicotine gum and patches, which worked really well for her. She also would keep a paper on her wall in her office that she would write the number of days it had been since she had quit which she found very motivating.  -What was difficult? Stress of 24/7 care of mother followed by her mother's passing   Tobacco Use Habits: -Triggers include getting together with a group that smokes. Working at home (works 1-2 hours, takes break to go smoke). More of a habit. Excitement - celebrating accomplishment/getting work done. Smoking when driving - doesn't bring cigarettes in her car anymore so she has stopped this. She gets edgy if she haven't smoked in a while. At least 1.5-2 hours when she starts craving. In the morning she smokes 2 or 3 cigarettes in the first hour and a half of waking up. Then it's usually 1 cigarette about every hour through the day.  -Does not wake at night to smoke. Has cravings but doesn't get up to do it. Reports she hasn't done that in years.  -Alcohol use: Drinks 1 glass of wine/night, fifth of vodka in a week. When drinking, she smokes more.  -Other smokers in household or daily life: None (lives alone). Daughter smokes but lives in [place]. Son vapes.  Identify social support: Siblings are her biggest supporters (2 brothers, 2 sisters). Older brother has quit for >30 years now  and encourages her to smoke. He is her only sibling that is local to [place]. Best friend - she and her husband quit smoking 15 years ago - could reach out to her or her husband for support.   On a scale of 1-10, how CONFIDENT are you that you will successfully quit: 9 (8-10)  -Barriers/concerns: Boredom - finding something to replace time with.   On a scale of 1-10, how IMPORTANT is it to you that you quit: 10 -Motivators: She is getting braces soon to correct her teeth/bite that have shifted since using a sleep apnea mouth piece, financial (spend $250/month on cigarettes), she would love to surprise her brother  that she has quit smoking, health and longevity (husband died from a heart attack and was a heavy smoker, grandsons are 21 and 5 and she wants to be around to watch them graduate high school and college)    Past pharmacotherapy trials:  [x]  Nicotine gum: worked well, quit x 11 months []  Nicotine lozenge [x]  Nicotine patch: worked well, quit x 11 months; during current quit attempt, experiences skin reactions with multiple brands and dose strengths []  Nicotine inhaler []  Nicotine nasal spray []  Bupropion (Zyban) [x]  Varenicline (Chantix): Tried Chantix once at a difficult time in life (work, husband, finances), found that it did not work well, had more "blue spells."   Current Outpatient Medications  Medication Instructions   albuterol (VENTOLIN HFA) 108 (90 Base) MCG/ACT inhaler 1-2 puffs, Inhalation, Every 4 hours PRN   aspirin EC 81 mg, Oral, 2 times daily after meals, Swallow whole.   Calcium Carb-Cholecalciferol 600-800 MG-UNIT TABS 1 tablet, Oral, 2 times daily   Cholecalciferol (VITAMIN D PO) 2,000 Units, Oral, Daily   denosumab (PROLIA) 60 mg, Subcutaneous, Every 6 months   folic acid (FOLVITE) 774 mcg, Oral, Daily   Gemtesa 75 mg, Oral, Daily   losartan (COZAAR) 50 MG tablet TAKE 1 TABLET EVERY DAY (ANNUAL APPOINTMENT DUE IN MAY, MUST SEE MD FOR FUTURE REFILLS)   metoprolol succinate (TOPROL-XL) 25 MG 24 hr tablet TAKE 1 TABLET BY MOUTH EVERY DAY   vitamin B-12 (CYANOCOBALAMIN) 1,000 mcg, Oral, Daily    Assessment/Plan: Crystal [name] has successfully quit smoking since 08/01/21. Today is day 22, as she keeps count on a piece of paper in her office. I will check in with her again next week on her progress.    Follow up phone call 1 week.   Time spent: 5 mins  Crystal [name], PharmD PGY2 Ambulatory Care Pharmacy Resident 08/23/2021 11:31 AM

## 2021-08-26 ENCOUNTER — Ambulatory Visit: Payer: Self-pay

## 2021-08-26 ENCOUNTER — Encounter: Payer: Self-pay | Admitting: Family Medicine

## 2021-08-26 ENCOUNTER — Other Ambulatory Visit: Payer: Self-pay

## 2021-08-26 ENCOUNTER — Ambulatory Visit: Payer: Medicare HMO | Admitting: Family Medicine

## 2021-08-26 VITALS — BP 130/84 | HR 92 | Ht 63.0 in | Wt 192.0 lb

## 2021-08-26 DIAGNOSIS — M25562 Pain in left knee: Secondary | ICD-10-CM

## 2021-08-26 DIAGNOSIS — G8929 Other chronic pain: Secondary | ICD-10-CM | POA: Diagnosis not present

## 2021-08-26 DIAGNOSIS — M1712 Unilateral primary osteoarthritis, left knee: Secondary | ICD-10-CM

## 2021-08-26 NOTE — Assessment & Plan Note (Signed)
Chronic problem with exacerbation.  Patient given a possibility of drainage but patient Baker's cyst was very small.  Patient at this point we will continue with the conservative therapy with the icing regimen, bracing, topical anti-inflammatories.  If worsening pain patient could be a candidate for the viscosupplementation.  Patient will follow up with me again in 3 months otherwise.

## 2021-08-26 NOTE — Patient Instructions (Signed)
Dont let grandkids take all dark chocolate See me again in 3 months

## 2021-08-29 ENCOUNTER — Telehealth: Payer: Self-pay | Admitting: Student-PharmD

## 2021-08-29 NOTE — Telephone Encounter (Addendum)
Crystal [name] is a 70 y.o. female and was referred to the pharmacist telephone-based smoking cessation service by pulmonologist Dr. Annamaria Boots.  Crystal [name] has successfully quit smoking as of 10/13. She experienced skin irritation with multiple brands of nicotine patches, so she is currently using a combination of nicotine gum and lozenges throughout the day. She finds that it is the hardest to continue not smoking when she experiences a burst of anger or short temper.   I spoke with Crystal [name] today and she reports she is doing well. She no longer counts the days it has been since quitting smoking ever since she passed day 21 because she feels it is a habit now to not smoke. She is very pleased with herself because she went to a fundraiser Saturday night with a lot of smokers in attendance and she successfully did not smoke. She has now told others that she has quit smoking including her best friend who is also a smoker and some ladies in her water fitness class. Reports she is using more gum than lozenges lately. She uses 2 mg gum/lozenges and uses maybe 6-10 pieces of gum and 2-3 lozenges per day. She is also pleased that in her month since quitting she has only gained 4 lbs.   Current tobacco use as of 08/08/21: None (quit 08/01/21)  Tobacco Use History -Baseline tobacco use as of 06/28/21: 20 cigarettes/day (1 ppd) -Time to first cigarette: < 30 minutes -Started smoking at 70 years old   Quit Attempt History  -Have you tried to quit in the past? Yes. Quit smoking for 11 months previously, but when mother moved in with her from the nursing home she started smoking again from the stress, and then her mother passed away 3 months later. Longest time other than that was 6 or 8 weeks about 20 years ago.  -Most recent quit attempt: February 2017 - January 2018  -Longest time ever been tobacco free: 11 months -What helped? In 2017 she used [facility] smoking cessation program. Her target quit date at that  time was the anniversary of husband's death (died at 9 of massive heart attack in 2008). She used to smoke 2 ppd when her husband was alive (currently 1 ppd). At that time, she used nicotine gum and patches, which worked really well for her. She also would keep a paper on her wall in her office that she would write the number of days it had been since she had quit which she found very motivating.  -What was difficult? Stress of 24/7 care of mother followed by her mother's passing   Tobacco Use Habits: -Triggers include getting together with a group that smokes. Working at home (works 1-2 hours, takes break to go smoke). More of a habit. Excitement - celebrating accomplishment/getting work done. Smoking when driving - doesn't bring cigarettes in her car anymore so she has stopped this. She gets edgy if she haven't smoked in a while. At least 1.5-2 hours when she starts craving. In the morning she smokes 2 or 3 cigarettes in the first hour and a half of waking up. Then it's usually 1 cigarette about every hour through the day.  -Does not wake at night to smoke. Has cravings but doesn't get up to do it. Reports she hasn't done that in years.  -Alcohol use: Drinks 1 glass of wine/night, fifth of vodka in a week. When drinking, she smokes more.  -Other smokers in household or daily life: None (lives alone). Daughter  smokes but lives in [place]. Son vapes.  Identify social support: Siblings are her biggest supporters (2 brothers, 2 sisters). Older brother has quit for >30 years now and encourages her to smoke. He is her only sibling that is local to [place]. Best friend - she and her husband quit smoking 15 years ago - could reach out to her or her husband for support.   On a scale of 1-10, how CONFIDENT are you that you will successfully quit: 9 (8-10)  -Barriers/concerns: Boredom - finding something to replace time with.   On a scale of 1-10, how IMPORTANT is it to you that you quit: 10 -Motivators: She is  getting braces soon to correct her teeth/bite that have shifted since using a sleep apnea mouth piece, financial (spend $250/month on cigarettes), she would love to surprise her brother that she has quit smoking, health and longevity (husband died from a heart attack and was a heavy smoker, grandsons are 21 and 5 and she wants to be around to watch them graduate high school and college)    Past pharmacotherapy trials:  [x]  Nicotine gum: worked well, quit x 11 months []  Nicotine lozenge [x]  Nicotine patch: worked well, quit x 11 months; during current quit attempt, experiences skin reactions with multiple brands and dose strengths []  Nicotine inhaler []  Nicotine nasal spray []  Bupropion (Zyban) [x]  Varenicline (Chantix): Tried Chantix once at a difficult time in life (work, husband, finances), found that it did not work well, had more "blue spells."   Current Outpatient Medications  Medication Instructions   albuterol (VENTOLIN HFA) 108 (90 Base) MCG/ACT inhaler 1-2 puffs, Inhalation, Every 4 hours PRN   aspirin EC 81 mg, Oral, 2 times daily after meals, Swallow whole.   Calcium Carb-Cholecalciferol 600-800 MG-UNIT TABS 1 tablet, Oral, 2 times daily   Cholecalciferol (VITAMIN D PO) 2,000 Units, Oral, Daily   denosumab (PROLIA) 60 mg, Subcutaneous, Every 6 months   folic acid (FOLVITE) 830 mcg, Oral, Daily   Gemtesa 75 mg, Oral, Daily   losartan (COZAAR) 50 MG tablet TAKE 1 TABLET EVERY DAY (ANNUAL APPOINTMENT DUE IN MAY, MUST SEE MD FOR FUTURE REFILLS)   metoprolol succinate (TOPROL-XL) 25 MG 24 hr tablet TAKE 1 TABLET BY MOUTH EVERY DAY   vitamin B-12 (CYANOCOBALAMIN) 1,000 mcg, Oral, Daily    Assessment/Plan: Crystal [name] has successfully quit smoking since 08/01/21. She has made it nearly a month which I congratulated her on. She seems more positive/encouraged about her quit attempt today than during previous calls. She continues to use nicotine gum and lozenges as needed throughout the day. As  she becomes more confident in her quitting we will start to wean these off until she no longer needs them either. This will take time and we will work toward this together. She is close to reaching her goal of not smoking through Thanksgiving so she can tell her brother she quit. I will touch base with her once more next week then plan for follow up after Thanksgiving.   Follow up phone call 1 week.   Time spent: 10 mins  Rebbeca Paul, PharmD PGY2 Ambulatory Care Pharmacy Resident 08/29/2021 2:27 PM

## 2021-08-30 DIAGNOSIS — Z1231 Encounter for screening mammogram for malignant neoplasm of breast: Secondary | ICD-10-CM | POA: Diagnosis not present

## 2021-08-30 LAB — HM MAMMOGRAPHY

## 2021-09-03 NOTE — Progress Notes (Signed)
HPI Female Smoker followed for OSA/ oral appliance, complicated by allergic rhinitis, anxiety, COPD, HBP, GERD HST 07/20/2018-AHI 20/hour, desaturation to 81%, body weight 192 pounds   ====================================================================   06/03/21- 70 year old female Smoker followed for OSA/ oral appliance,(Dr Ron Parker) complicated by allergic rhinitis, anxiety, COPD, HBP, GERD Allergist was following COPD    Body weight today-188 lbs Covid vax-4 Phizer -----OSA, uses mouth guard, denies problems Teeth are shifting, affecting bite. She has f/u appointment pending with Dr Ron Parker to discuss. Recently visited daughter who told her she snores some but without obvious apneas wearing appliance. She feels she sleeps ok.  She asked to talk about breathing and smoking. Interested in the pharmacy smoking program. Allergist did spirometry in May: FVC 2.44, FEV1 1.97/ 91%, R 0.81. Has albuterol hfa- uses before exertion, but can't tell any difference. Not a lot of wheeze or cough. CXR 08/31/20- IMPRESSION: No acute disease  09/03/21- 70 year old female former Smoker (quit October 13) followed for OSA/ oral appliance,(Dr Ron Parker) complicated by allergic rhinitis, anxiety, COPD, HBP, GERD Allergist was following COPD    CPAP auto 5-15/ [facility] Body weight today-191 lbs Covid vax-4 Phizer Flu vax-declines Pharmacy Smoking Program- [place] gum Last visit- NP Video 9/28- Lung Cancer Screening program She stopped using an oral appliance because it moved her teeth and changed her bite.  She went back to CPAP.  She quit smoking and is gradually tapering off of gum and lozenges. PFT 09/04/21- WNL. Possible restriction CT chest Low Dose 07/18/21- IMPRESSION: 1. Lung-RADS 1, negative. Continue annual screening with low-dose chest CT without contrast in 12 months. 2. Aortic atherosclerosis (ICD10-I70.0). Coronary artery calcification. 3.  Emphysema (ICD10-J43.9).    ROS-see HPI   + =  positive Constitutional:    weight loss, night sweats, fevers, chills, +fatigue, lassitude. HEENT:    headaches, difficulty swallowing, tooth/dental problems, sore throat,       sneezing, itching, ear ache, nasal congestion, post nasal drip, snoring CV:    chest pain, orthopnea, PND, swelling in lower extremities, anasarca,                                   dizziness, palpitations Resp:  + shortness of breath with exertion or at rest.                productive cough,   non-productive cough, coughing up of blood.              change in color of mucus.  wheezing.   Skin:    rash or lesions. GI:  No-   heartburn, indigestion, abdominal pain, nausea, vomiting, diarrhea,                 change in bowel habits, loss of appetite GU: dysuria, change in color of urine, no urgency or frequency.   flank pain. MS:   joint pain, stiffness, decreased range of motion, back pain. Neuro-     nothing unusual Psych:  change in mood or affect.  depression or anxiety.   memory loss.  OBJ- Physical Exam General- Alert, Oriented, Affect-appropriate, Distress- none acute, + overweight Skin- rash-none, lesions- none, excoriation- none Lymphadenopathy- none Head- atraumatic            Eyes- Gross vision intact, PERRLA, conjunctivae and secretions clear            Ears- Hearing, canals-normal  Nose- Clear, no-Septal dev, mucus, polyps, erosion, perforation             Throat- Mallampati II-III , mucosa clear , drainage- none, tonsils- atrophic Neck- flexible , trachea midline, no stridor , thyroid nl, carotid no bruit Chest - symmetrical excursion , unlabored           Heart/CV- RRR , no murmur , no gallop  , no rub, nl s1 s2                           - JVD- none , edema- none, stasis changes- none, varices- none           Lung- clear to P&A, wheeze- none, coug+dry, dullness-none, rub- none           Chest wall-  Abd-  Br/ Gen/ Rectal- Not done, not indicated Extrem- cyanosis- none, clubbing, none,  atrophy- none, strength- nl Neuro- grossly intact to observation

## 2021-09-04 ENCOUNTER — Ambulatory Visit (INDEPENDENT_AMBULATORY_CARE_PROVIDER_SITE_OTHER): Payer: Medicare HMO | Admitting: Internal Medicine

## 2021-09-04 ENCOUNTER — Encounter: Payer: Self-pay | Admitting: Internal Medicine

## 2021-09-04 ENCOUNTER — Ambulatory Visit: Payer: Medicare HMO | Admitting: Internal Medicine

## 2021-09-04 ENCOUNTER — Other Ambulatory Visit: Payer: Self-pay

## 2021-09-04 VITALS — BP 122/68 | HR 66 | Temp 98.3°F | Ht 63.0 in | Wt 191.0 lb

## 2021-09-04 DIAGNOSIS — G4733 Obstructive sleep apnea (adult) (pediatric): Secondary | ICD-10-CM | POA: Diagnosis not present

## 2021-09-04 DIAGNOSIS — J449 Chronic obstructive pulmonary disease, unspecified: Secondary | ICD-10-CM | POA: Diagnosis not present

## 2021-09-04 DIAGNOSIS — Z720 Tobacco use: Secondary | ICD-10-CM | POA: Diagnosis not present

## 2021-09-04 LAB — PULMONARY FUNCTION TEST
DL/VA % pred: 105 %
DL/VA: 4.39 ml/min/mmHg/L
DLCO cor % pred: 95 %
DLCO cor: 17.87 ml/min/mmHg
DLCO unc % pred: 95 %
DLCO unc: 17.87 ml/min/mmHg
FEF 25-75 Post: 2.19 L/sec
FEF 25-75 Pre: 1.97 L/sec
FEF2575-%Change-Post: 11 %
FEF2575-%Pred-Post: 122 %
FEF2575-%Pred-Pre: 109 %
FEV1-%Change-Post: 1 %
FEV1-%Pred-Post: 83 %
FEV1-%Pred-Pre: 82 %
FEV1-Post: 1.79 L
FEV1-Pre: 1.77 L
FEV1FVC-%Change-Post: 7 %
FEV1FVC-%Pred-Pre: 108 %
FEV6-%Change-Post: -5 %
FEV6-%Pred-Post: 74 %
FEV6-%Pred-Pre: 79 %
FEV6-Post: 2.03 L
FEV6-Pre: 2.14 L
FEV6FVC-%Pred-Post: 104 %
FEV6FVC-%Pred-Pre: 104 %
FVC-%Change-Post: -5 %
FVC-%Pred-Post: 71 %
FVC-%Pred-Pre: 75 %
FVC-Post: 2.03 L
FVC-Pre: 2.14 L
Post FEV1/FVC ratio: 88 %
Post FEV6/FVC ratio: 100 %
Pre FEV1/FVC ratio: 82 %
Pre FEV6/FVC Ratio: 100 %
RV % pred: 93 %
RV: 2.01 L
TLC % pred: 91 %
TLC: 4.49 L

## 2021-09-04 NOTE — Patient Instructions (Signed)
Fine to continue Ventolin/ albuterol hfa- you can call us to refill as needed  I'm proud of your smoking cessation effort- good work!!  Sales executive- D[facility]- continue auto 5-15, mask of choice, humidifier, supplies,  Please install AirView  We will get you back within the usual insurance window for follow-up after you got your CPAP. Please call if we can help.

## 2021-09-04 NOTE — Patient Instructions (Signed)
Full PFT performed today. °

## 2021-09-04 NOTE — Progress Notes (Signed)
Full PFT performed today. °

## 2021-09-05 ENCOUNTER — Telehealth: Payer: Self-pay | Admitting: Student-PharmD

## 2021-09-05 NOTE — Telephone Encounter (Signed)
Crystal [name] is a 70 y.o. female and was referred to the pharmacist telephone-based smoking cessation service by pulmonologist Dr. Annamaria Boots.  Crystal [name] has successfully quit smoking as of 10/13. She experienced skin irritation with multiple brands of nicotine patches, so she is currently using a combination of nicotine gum and lozenges throughout the day. She finds that it is the hardest to continue not smoking when she experiences a burst of anger or short temper.   I spoke with Crystal [name] today and she reports she is doing well. She has continued to tell more people that she has quit including her daughter-in-law and grandson last weekend. She uses 2 mg gum/lozenges and uses maybe 6-10 pieces of gum and 2-3 lozenges per day - same as last week. She states that not smoking with her morning coffee is still the hardest but she is able to push through without smoking.   Current tobacco use: None (quit 08/01/21)  Tobacco Use History -Baseline tobacco use as of 06/28/21: 20 cigarettes/day (1 ppd) -Time to first cigarette: < 30 minutes -Started smoking at 70 years old   Quit Attempt History  -Have you tried to quit in the past? Yes. Quit smoking for 11 months previously, but when mother moved in with her from the nursing home she started smoking again from the stress, and then her mother passed away 3 months later. Longest time other than that was 6 or 8 weeks about 20 years ago.  -Most recent quit attempt: February 2017 - January 2018  -Longest time ever been tobacco free: 11 months -What helped? In 2017 she used [facility]'s smoking cessation program. Her target quit date at that time was the anniversary of husband's death (died at 48 of massive heart attack in 2008). She used to smoke 2 ppd when her husband was alive (currently 1 ppd). At that time, she used nicotine gum and patches, which worked really well for her. She also would keep a paper on her wall in her office that she would write the number  of days it had been since she had quit which she found very motivating.  -What was difficult? Stress of 24/7 care of mother followed by her mother's passing   Tobacco Use Habits: -Triggers include getting together with a group that smokes. Working at home (works 1-2 hours, takes break to go smoke). More of a habit. Excitement - celebrating accomplishment/getting work done. Smoking when driving - doesn't bring cigarettes in her car anymore so she has stopped this. She gets edgy if she haven't smoked in a while. At least 1.5-2 hours when she starts craving. In the morning she smokes 2 or 3 cigarettes in the first hour and a half of waking up. Then it's usually 1 cigarette about every hour through the day.  -Does not wake at night to smoke. Has cravings but doesn't get up to do it. Reports she hasn't done that in years.  -Alcohol use: Drinks 1 glass of wine/night, fifth of vodka in a week. When drinking, she smokes more.  -Other smokers in household or daily life: None (lives alone). Daughter smokes but lives in [place]. Son vapes.  Identify social support: Siblings are her biggest supporters (2 brothers, 2 sisters). Older brother has quit for >30 years now and encourages her to smoke. He is her only sibling that is local to [place]. Best friend - she and her husband quit smoking 15 years ago - could reach out to her or her husband for support.  On a scale of 1-10, how CONFIDENT are you that you will successfully quit: 9 (8-10)  -Barriers/concerns: Boredom - finding something to replace time with.   On a scale of 1-10, how IMPORTANT is it to you that you quit: 10 -Motivators: She is getting braces soon to correct her teeth/bite that have shifted since using a sleep apnea mouth piece, financial (spend $250/month on cigarettes), she would love to surprise her brother that she has quit smoking, health and longevity (husband died from a heart attack and was a heavy smoker, grandsons are 48 and 5 and she wants to be  around to watch them graduate high school and college)    Past pharmacotherapy trials:  [x]  Nicotine gum: worked well, quit x 11 months []  Nicotine lozenge [x]  Nicotine patch: worked well, quit x 11 months; during current quit attempt, experiences skin reactions with multiple brands and dose strengths []  Nicotine inhaler []  Nicotine nasal spray []  Bupropion (Zyban) [x]  Varenicline (Chantix): Tried Chantix once at a difficult time in life (work, husband, finances), found that it did not work well, had more "blue spells."   Current Outpatient Medications  Medication Instructions   albuterol (VENTOLIN HFA) 108 (90 Base) MCG/ACT inhaler 1-2 puffs, Inhalation, Every 4 hours PRN   aspirin EC 81 mg, Oral, 2 times daily after meals, Swallow whole.   Calcium Carb-Cholecalciferol 600-800 MG-UNIT TABS 1 tablet, Oral, 2 times daily   Cholecalciferol (VITAMIN D PO) 2,000 Units, Oral, Daily   denosumab (PROLIA) 60 mg, Subcutaneous, Every 6 months   folic acid (FOLVITE) 638 mcg, Oral, Daily   Gemtesa 75 mg, Oral, Daily   losartan (COZAAR) 50 MG tablet TAKE 1 TABLET EVERY DAY (ANNUAL APPOINTMENT DUE IN MAY, MUST SEE MD FOR FUTURE REFILLS)   metoprolol succinate (TOPROL-XL) 25 MG 24 hr tablet TAKE 1 TABLET BY MOUTH EVERY DAY   vitamin B-12 (CYANOCOBALAMIN) 1,000 mcg, Oral, Daily    Assessment/Plan: Crystal [name] has successfully quit smoking since 08/01/21. She has made it now over a month which I congratulated her on. She seems more positive/encouraged about her quit attempt lately than during initial calls. She continues to use nicotine gum and lozenges as needed throughout the day. As she becomes more confident in her quitting we will start to wean these off until she no longer needs them either. This will take time and we will work toward this together. She is close to reaching her goal of not smoking through Thanksgiving so she can tell her brother she quit. I will touch base with her after Thanksgiving.    Follow up phone call 2 weeks.   Time spent: 10 mins  Rebbeca Paul, PharmD PGY2 Ambulatory Care Pharmacy Resident 09/05/2021 3:35 PM

## 2021-09-19 ENCOUNTER — Telehealth: Payer: Self-pay | Admitting: Student-PharmD

## 2021-09-19 NOTE — Telephone Encounter (Signed)
Crystal [name] is a 70 y.o. female and was referred to the pharmacist telephone-based smoking cessation service by pulmonologist Dr. Annamaria Boots.  Crystal [name] has successfully quit smoking as of 10/13. She experienced skin irritation with multiple brands of nicotine patches, so she is currently using a combination of nicotine gum and lozenges throughout the day.   I spoke with Crystal [name] today and she reports she is doing well. She saw her brother at Thanksgiving last week and was able to surprise him with the fact that she has quit smoking, which was a goal she was working toward since the beginning of our conversations. He was very surprised and proud of her. She is down to using 0-1 lozenges per day and 6 pieces of gum. She has successfully made it through multiple social events with smokers and stressful situations without smoking. When asked if there are any moments where she is tempted she smoke, she said that there are occasionally times when she is tempted but never to actually act on it. She stated "I'm not going to buy another pack." Her morning coffee is still the hardest time to get through without smoking but she continues to push through. She is not planning to schedule getting braces until the new year.   Current tobacco use: None (quit 08/01/21)  Tobacco Use History -Baseline tobacco use as of 06/28/21: 20 cigarettes/day (1 ppd) -Time to first cigarette: < 30 minutes -Started smoking at 70 years old  Quit Attempt History  -Have you tried to quit in the past? Yes. Quit smoking for 11 months previously, but when mother moved in with her from the nursing home she started smoking again from the stress, and then her mother passed away 3 months later. Longest time other than that was 6 or 8 weeks about 20 years ago.  -Most recent quit attempt: February 2017 - January 2018  -Longest time ever been tobacco free: 11 months -What helped? In 2017 she used [facility]'s smoking cessation program. Her  target quit date at that time was the anniversary of husband's death (died at 67 of massive heart attack in 2008). She used to smoke 2 ppd when her husband was alive (currently 1 ppd). At that time, she used nicotine gum and patches, which worked really well for her. She also would keep a paper on her wall in her office that she would write the number of days it had been since she had quit which she found very motivating.  -What was difficult? Stress of 24/7 care of mother followed by her mother's passing   Tobacco Use Habits: -Triggers include getting together with a group that smokes. Working at home (works 1-2 hours, takes break to go smoke). More of a habit. Excitement - celebrating accomplishment/getting work done. Smoking when driving - doesn't bring cigarettes in her car anymore so she has stopped this. She gets edgy if she haven't smoked in a while. At least 1.5-2 hours when she starts craving. In the morning she smokes 2 or 3 cigarettes in the first hour and a half of waking up. Then it's usually 1 cigarette about every hour through the day.  -Does not wake at night to smoke. Has cravings but doesn't get up to do it. Reports she hasn't done that in years.  -Alcohol use: Drinks 1 glass of wine/night, fifth of vodka in a week. When drinking, she smokes more.  -Other smokers in household or daily life: None (lives alone). Daughter smokes but lives in [place].  Son vapes.  Identify social support: Siblings are her biggest supporters (2 brothers, 2 sisters). Older brother has quit for >30 years now and encourages her to smoke. He is her only sibling that is local to [place]. Best friend - she and her husband quit smoking 15 years ago - could reach out to her or her husband for support.   On a scale of 1-10, how CONFIDENT are you that you will successfully quit: 9 (8-10)  -Barriers/concerns: Boredom - finding something to replace time with.   On a scale of 1-10, how IMPORTANT is it to you that you quit:  10 -Motivators: She is getting braces soon to correct her teeth/bite that have shifted since using a sleep apnea mouth piece, financial (spend $250/month on cigarettes), she would love to surprise her brother that she has quit smoking, health and longevity (husband died from a heart attack and was a heavy smoker, grandsons are 103 and 5 and she wants to be around to watch them graduate high school and college)    Past pharmacotherapy trials:  [x]  Nicotine gum: worked well, quit x 11 months []  Nicotine lozenge [x]  Nicotine patch: worked well, quit x 11 months; during current quit attempt, experiences skin reactions with multiple brands and dose strengths []  Nicotine inhaler []  Nicotine nasal spray []  Bupropion (Zyban) [x]  Varenicline (Chantix): Tried Chantix once at a difficult time in life (work, husband, finances), found that it did not work well, had more "blue spells."   Current Outpatient Medications  Medication Instructions   albuterol (VENTOLIN HFA) 108 (90 Base) MCG/ACT inhaler 1-2 puffs, Inhalation, Every 4 hours PRN   Calcium Carb-Cholecalciferol 600-800 MG-UNIT TABS 1 tablet, Oral, 2 times daily   Cholecalciferol (VITAMIN D PO) 2,000 Units, Oral, Daily   denosumab (PROLIA) 60 mg, Subcutaneous, Every 6 months   folic acid (FOLVITE) 371 mcg, Oral, Daily   Gemtesa 75 mg, Oral, Daily   losartan (COZAAR) 50 MG tablet TAKE 1 TABLET EVERY DAY (ANNUAL APPOINTMENT DUE IN MAY, MUST SEE MD FOR FUTURE REFILLS)   metoprolol succinate (TOPROL-XL) 25 MG 24 hr tablet TAKE 1 TABLET BY MOUTH EVERY DAY   vitamin B-12 (CYANOCOBALAMIN) 1,000 mcg, Oral, Daily    Assessment/Plan: Crystal [name] has successfully quit smoking since 08/01/21. I congratulated her on her success with continue to not smoke. We discussed working slowly toward weaning off of nicotine replacement. She is naturally already doing this since the amount of gum/lozenges she is using has declined since our last conversation. She will work  toward this slowly as her confidence as a nonsmoker continues to grow, with the main goal being to continue to not smoke, but to decrease the amount of NRT needed.    Follow up phone call 2 weeks.   Time spent: 10 mins  Rebbeca Paul, PharmD PGY2 Ambulatory Care Pharmacy Resident 09/19/2021 3:33 PM

## 2021-10-03 ENCOUNTER — Telehealth: Payer: Self-pay | Admitting: Student-PharmD

## 2021-10-03 NOTE — Telephone Encounter (Signed)
Crystal [name] is a 70 y.o. female and was referred to the pharmacist telephone-based smoking cessation service by pulmonologist Dr. Annamaria Boots.  Crystal [name] has successfully quit smoking as of 10/13. She experienced skin irritation with multiple brands of nicotine patches, so she is currently using a combination of nicotine gum and lozenges throughout the day.   I spoke with Crystal [name] today and she reports it has been more difficult lately with urges to smoke. She's not sure if it's because it's the holiday season and things have been a little busier. She says she has still never considered buying a pack of cigarettes and doesn't plan to. She is still using about the same amount of lozenges (0-1) and gum (6 pieces). She has now reached the 2 month mark of not smoking and tells me that she would have never made it this far without having the accountability in me and thanks me for following up with her to help her succeed.   Current tobacco use: None (quit 08/01/21)  Tobacco Use History -Baseline tobacco use as of 06/28/21: 20 cigarettes/day (1 ppd) -Time to first cigarette: < 30 minutes -Started smoking at 70 years old  Quit Attempt History  -Have you tried to quit in the past? Yes. Quit smoking for 11 months previously, but when mother moved in with her from the nursing home she started smoking again from the stress, and then her mother passed away 3 months later. Longest time other than that was 6 or 8 weeks about 20 years ago.  -Most recent quit attempt: February 2017 - January 2018  -Longest time ever been tobacco free: 11 months -What helped? In 2017 she used [facility]'s smoking cessation program. Her target quit date at that time was the anniversary of husband's death (died at 45 of massive heart attack in 2008). She used to smoke 2 ppd when her husband was alive (currently 1 ppd). At that time, she used nicotine gum and patches, which worked really well for her. She also would keep a paper on her  wall in her office that she would write the number of days it had been since she had quit which she found very motivating.  -What was difficult? Stress of 24/7 care of mother followed by her mother's passing   Tobacco Use Habits: -Triggers include getting together with a group that smokes. Working at home (works 1-2 hours, takes break to go smoke). More of a habit. Excitement - celebrating accomplishment/getting work done. Smoking when driving - doesn't bring cigarettes in her car anymore so she has stopped this. She gets edgy if she haven't smoked in a while. At least 1.5-2 hours when she starts craving. In the morning she smokes 2 or 3 cigarettes in the first hour and a half of waking up. Then it's usually 1 cigarette about every hour through the day.  -Does not wake at night to smoke. Has cravings but doesn't get up to do it. Reports she hasn't done that in years.  -Alcohol use: Drinks 1 glass of wine/night, fifth of vodka in a week. When drinking, she smokes more.  -Other smokers in household or daily life: None (lives alone). Daughter smokes but lives in [place]. Son vapes.  Identify social support: Siblings are her biggest supporters (2 brothers, 2 sisters). Older brother has quit for >30 years now and encourages her to smoke. He is her only sibling that is local to [place]. Best friend - she and her husband quit smoking 15 years ago -  could reach out to her or her husband for support.   On a scale of 1-10, how CONFIDENT are you that you will successfully quit: 9 (8-10)  -Barriers/concerns: Boredom - finding something to replace time with.   On a scale of 1-10, how IMPORTANT is it to you that you quit: 10 -Motivators: She is getting braces soon to correct her teeth/bite that have shifted since using a sleep apnea mouth piece, financial (spend $250/month on cigarettes), she would love to surprise her brother that she has quit smoking, health and longevity (husband died from a heart attack and was a  heavy smoker, grandsons are 54 and 5 and she wants to be around to watch them graduate high school and college)   Past pharmacotherapy trials:  [x]  Nicotine gum: worked well, quit x 11 months []  Nicotine lozenge [x]  Nicotine patch: worked well, quit x 11 months; during current quit attempt, experiences skin reactions with multiple brands and dose strengths []  Nicotine inhaler []  Nicotine nasal spray []  Bupropion (Zyban) [x]  Varenicline (Chantix): Tried Chantix once at a difficult time in life (work, husband, finances), found that it did not work well, had more "blue spells."   Current Outpatient Medications  Medication Instructions   albuterol (VENTOLIN HFA) 108 (90 Base) MCG/ACT inhaler 1-2 puffs, Inhalation, Every 4 hours PRN   Calcium Carb-Cholecalciferol 600-800 MG-UNIT TABS 1 tablet, Oral, 2 times daily   Cholecalciferol (VITAMIN D PO) 2,000 Units, Oral, Daily   denosumab (PROLIA) 60 mg, Subcutaneous, Every 6 months   folic acid (FOLVITE) 323 mcg, Oral, Daily   Gemtesa 75 mg, Oral, Daily   losartan (COZAAR) 50 MG tablet TAKE 1 TABLET EVERY DAY (ANNUAL APPOINTMENT DUE IN MAY, MUST SEE MD FOR FUTURE REFILLS)   metoprolol succinate (TOPROL-XL) 25 MG 24 hr tablet TAKE 1 TABLET BY MOUTH EVERY DAY   vitamin B-12 (CYANOCOBALAMIN) 1,000 mcg, Oral, Daily    Assessment/Plan: Crystal [name] has successfully quit smoking since 08/01/21. I congratulated her on her success with continuing to not smoke. We discussed working slowly toward weaning off of nicotine replacement but not hesitating to use it when she needs it, like in this season of it being a little more difficult.   Follow up phone call in a few weeks.   Time spent: 10 mins  Rebbeca Paul, PharmD PGY2 Ambulatory Care Pharmacy Resident 10/03/2021 4:11 PM

## 2021-10-14 ENCOUNTER — Encounter: Payer: Self-pay | Admitting: Internal Medicine

## 2021-10-14 NOTE — Assessment & Plan Note (Signed)
She benefits from CPAP.  She will bring download card. Plan-continue auto 5-15

## 2021-10-14 NOTE — Assessment & Plan Note (Signed)
No significant obstruction despite her smoking history Plan-okay to use rescue inhaler before exercise

## 2021-10-24 ENCOUNTER — Telehealth: Payer: Self-pay | Admitting: Student-PharmD

## 2021-10-24 NOTE — Telephone Encounter (Signed)
Crystal [name] is a 71 y.o. female and was referred to the pharmacist telephone-based smoking cessation service by pulmonologist Dr. Annamaria Boots.  Crystal [name] has successfully quit smoking as of 10/13. She experienced skin irritation with multiple brands of nicotine patches, so she is currently using a combination of nicotine gum and lozenges throughout the day.   I spoke with Crystal [name] today and she reports still thinking of smoking occasionally but never wanting to act on it. In those moments that test her that used to lead to her smoking, she will step aside and take a few deep breaths and that gets her through. She is down to using 3-4 lozenges/gum per day just as needed.   Current tobacco use: None (quit 08/01/21)  Tobacco Use History -Baseline tobacco use as of 06/28/21: 20 cigarettes/day (1 ppd) -Time to first cigarette: < 30 minutes -Started smoking at 71 years old  Quit Attempt History  -Have you tried to quit in the past? Yes. Quit smoking for 11 months previously, but when mother moved in with her from the nursing home she started smoking again from the stress, and then her mother passed away 3 months later. Longest time other than that was 6 or 8 weeks about 20 years ago.  -Most recent quit attempt: February 2017 - January 2018  -Longest time ever been tobacco free: 11 months -What helped? In 2017 she used [facility]'s smoking cessation program. Her target quit date at that time was the anniversary of husband's death (died at 69 of massive heart attack in 2008). She used to smoke 2 ppd when her husband was alive (currently 1 ppd). At that time, she used nicotine gum and patches, which worked really well for her. She also would keep a paper on her wall in her office that she would write the number of days it had been since she had quit which she found very motivating.  -What was difficult? Stress of 24/7 care of mother followed by her mother's passing   Tobacco Use Habits: -Triggers include  getting together with a group that smokes. Working at home (works 1-2 hours, takes break to go smoke). More of a habit. Excitement - celebrating accomplishment/getting work done. Smoking when driving - doesn't bring cigarettes in her car anymore so she has stopped this. She gets edgy if she haven't smoked in a while. At least 1.5-2 hours when she starts craving. In the morning she smokes 2 or 3 cigarettes in the first hour and a half of waking up. Then it's usually 1 cigarette about every hour through the day.  -Does not wake at night to smoke. Has cravings but doesn't get up to do it. Reports she hasn't done that in years.  -Alcohol use: Drinks 1 glass of wine/night, fifth of vodka in a week. When drinking, she smokes more.  -Other smokers in household or daily life: None (lives alone). Daughter smokes but lives in [place]. Son vapes.  Identify social support: Siblings are her biggest supporters (2 brothers, 2 sisters). Older brother has quit for >30 years now and encourages her to smoke. He is her only sibling that is local to [place]. Best friend - she and her husband quit smoking 15 years ago - could reach out to her or her husband for support.   On a scale of 1-10, how CONFIDENT are you that you will successfully quit: 9 (8-10)  -Barriers/concerns: Boredom - finding something to replace time with.   On a scale of 1-10, how  IMPORTANT is it to you that you quit: 10 -Motivators: She is getting braces soon to correct her teeth/bite that have shifted since using a sleep apnea mouth piece, financial (spend $250/month on cigarettes), she would love to surprise her brother that she has quit smoking, health and longevity (husband died from a heart attack and was a heavy smoker, grandsons are 59 and 5 and she wants to be around to watch them graduate high school and college)   Past pharmacotherapy trials:  [x]  Nicotine gum: worked well, quit x 11 months []  Nicotine lozenge [x]  Nicotine patch: worked well, quit x  11 months; during current quit attempt, experiences skin reactions with multiple brands and dose strengths []  Nicotine inhaler []  Nicotine nasal spray []  Bupropion (Zyban) [x]  Varenicline (Chantix): Tried Chantix once at a difficult time in life (work, husband, finances), found that it did not work well, had more "blue spells."   Current Outpatient Medications  Medication Instructions   albuterol (VENTOLIN HFA) 108 (90 Base) MCG/ACT inhaler 1-2 puffs, Inhalation, Every 4 hours PRN   Calcium Carb-Cholecalciferol 600-800 MG-UNIT TABS 1 tablet, Oral, 2 times daily   Cholecalciferol (VITAMIN D PO) 2,000 Units, Oral, Daily   denosumab (PROLIA) 60 mg, Subcutaneous, Every 6 months   folic acid (FOLVITE) 500 mcg, Oral, Daily   Gemtesa 75 mg, Oral, Daily   losartan (COZAAR) 50 MG tablet TAKE 1 TABLET EVERY DAY (ANNUAL APPOINTMENT DUE IN MAY, MUST SEE MD FOR FUTURE REFILLS)   metoprolol succinate (TOPROL-XL) 25 MG 24 hr tablet TAKE 1 TABLET BY MOUTH EVERY DAY   vitamin B-12 (CYANOCOBALAMIN) 1,000 mcg, Oral, Daily    Assessment/Plan: Crystal [name] has successfully quit smoking since 08/01/21. I congratulated her on her success with continuing to not smoke. We discussed working slowly toward weaning off of nicotine replacement but not hesitating to use it when she needs it.   Follow up phone call in 1 month.    Time spent: 10 mins  Rebbeca Paul, PharmD PGY2 Ambulatory Care Pharmacy Resident 10/24/2021 1:07 PM

## 2021-11-07 NOTE — Progress Notes (Signed)
HPI Female Smoker followed for OSA/ oral appliance, complicated by allergic rhinitis, anxiety, COPD, HBP, GERD HST 07/20/2018-AHI 20/hour, desaturation to 81%, body weight 192 pounds PFT 09/04/21- WNL. 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  She went back to CPAP.  She quit smoking and is gradually tapering off of gum and lozenges. PFT 09/04/21- WNL. Possible restriction CT chest Low Dose 07/18/21- IMPRESSION: 1. Lung-RADS 1, negative. Continue annual screening with low-dose chest CT without contrast in 12 months. 2. Aortic atherosclerosis (ICD10-I70.0). Coronary artery calcification. 3.  Emphysema (ICD10-J43.9).  11/08/21- 71 year old female former Smoker (quit October 13) followed for OSA, complicated by allergic rhinitis, anxiety, COPD, HBP, GERD Allergist was following COPD    CPAP auto 5-15/ [facility] Download- compliance 100%, AHI 0.2/ hr Body weight today-199 lbs Covid vax-4 Phizer Flu vax-declines Pharmacy Smoking Program- Nicorette gum -----Patient states that she has shortness of breath with activity and swelling in both legs but more in the left leg. Denies cough Download reviewed.  She is very comfortable with her CPAP and falls asleep as soon as she puts it on. She is at least more aware of dyspnea with exertion since she has quit smoking-discussed.  She thinks her legs have been swelling some, especially the left lower leg.  She has had some discomfort at the left ankle.   She is aware of atherosclerosis on her CT.  Since PFT was normal in November and chest exam is clear, we will get appropriate lab work including check for D-dimer, but I have asked her to discuss possible cardiac evaluation with her primary physician, Dr. Jenny Reichmann.  ROS-see HPI   + = positive Constitutional:    weight loss, night sweats, fevers, chills, +fatigue, lassitude. HEENT:    headaches, difficulty swallowing, tooth/dental problems, sore throat,       sneezing, itching, ear ache, nasal congestion, post nasal drip, snoring CV:    chest pain, orthopnea, PND, +swelling in lower extremities, anasarca,                                   dizziness, palpitations Resp:  + shortness of breath with exertion or at rest.                productive cough,   non-productive cough, coughing up of blood.              change in color of mucus.  wheezing.   Skin:    rash or lesions. GI:  No-   heartburn, indigestion, abdominal pain, nausea, vomiting, diarrhea,                 change in bowel habits, loss of appetite GU: dysuria, change in color of urine, no urgency or frequency.   flank pain. MS:   joint pain, stiffness, decreased range of motion, back pain. Neuro-     nothing unusual Psych:  change in mood or affect.  depression or anxiety.   memory loss.  OBJ- Physical Exam General- Alert, Oriented, Affect-appropriate, Distress- none acute, +  overweight Skin- rash-none, lesions- none, excoriation- none Lymphadenopathy- none Head- atraumatic            Eyes- Gross vision intact, PERRLA, conjunctivae and secretions clear            Ears- Hearing, canals-normal            Nose- Clear, no-Septal dev, mucus, polyps, erosion, perforation             Throat- Mallampati II-III , mucosa clear , drainage- none, tonsils- atrophic Neck- flexible , trachea midline, no stridor , thyroid nl, carotid no bruit Chest - symmetrical excursion , unlabored           Heart/CV- RRR , no murmur , no gallop  , no rub, nl s1  s2                           - JVD- none , edema- none, stasis changes- none, varices- none           Lung- clear to P&A, wheeze- none, cough-none, dullness-none, rub- none           Chest wall-  Abd-  Br/ Gen/ Rectal- Not done, not indicated Extrem- +heavy legs with small superficial varices, but no pitting edema. Neg Homan's. Neuro- grossly intact to observation

## 2021-11-08 ENCOUNTER — Ambulatory Visit: Payer: Medicare HMO | Admitting: Internal Medicine

## 2021-11-08 ENCOUNTER — Encounter: Payer: Self-pay | Admitting: Internal Medicine

## 2021-11-08 ENCOUNTER — Other Ambulatory Visit: Payer: Self-pay

## 2021-11-08 VITALS — BP 124/78 | HR 70 | Temp 98.5°F | Ht 63.0 in | Wt 199.6 lb

## 2021-11-08 DIAGNOSIS — R609 Edema, unspecified: Secondary | ICD-10-CM | POA: Diagnosis not present

## 2021-11-08 DIAGNOSIS — G4733 Obstructive sleep apnea (adult) (pediatric): Secondary | ICD-10-CM

## 2021-11-08 DIAGNOSIS — R0609 Other forms of dyspnea: Secondary | ICD-10-CM | POA: Diagnosis not present

## 2021-11-08 DIAGNOSIS — J449 Chronic obstructive pulmonary disease, unspecified: Secondary | ICD-10-CM

## 2021-11-08 LAB — BASIC METABOLIC PANEL
BUN: 17 mg/dL (ref 6–23)
CO2: 29 mEq/L (ref 19–32)
Calcium: 9.5 mg/dL (ref 8.4–10.5)
Chloride: 103 mEq/L (ref 96–112)
Creatinine, Ser: 0.7 mg/dL (ref 0.40–1.20)
GFR: 86.93 mL/min (ref >60.00)
Glucose, Bld: 97 mg/dL (ref 70–99)
Potassium: 4.9 mEq/L (ref 3.5–5.1)
Sodium: 137 mEq/L (ref 135–145)

## 2021-11-08 LAB — CBC WITH DIFFERENTIAL/PLATELET
Basophils Absolute: 0.1 10*3/uL (ref 0.0–0.1)
Basophils Relative: 1.2 % (ref 0.0–3.0)
Eosinophils Absolute: 0.2 10*3/uL (ref 0.0–0.7)
Eosinophils Relative: 2.8 % (ref 0.0–5.0)
HCT: 40.8 % (ref 36.0–46.0)
Hemoglobin: 13.4 g/dL (ref 12.0–15.0)
Lymphocytes Relative: 32.7 % (ref 12.0–46.0)
Lymphs Abs: 1.8 10*3/uL (ref 0.7–4.0)
MCHC: 32.9 g/dL (ref 30.0–36.0)
MCV: 94.6 fl (ref 78.0–100.0)
Monocytes Absolute: 0.6 10*3/uL (ref 0.1–1.0)
Monocytes Relative: 11.2 % (ref 3.0–12.0)
Neutro Abs: 2.9 10*3/uL (ref 1.4–7.7)
Neutrophils Relative %: 52.1 % (ref 43.0–77.0)
Platelets: 245 10*3/uL (ref 150.0–400.0)
RBC: 4.31 Mil/uL (ref 3.87–5.11)
RDW: 13.2 % (ref 11.5–15.5)
WBC: 5.5 10*3/uL (ref 4.0–10.5)

## 2021-11-08 LAB — D-DIMER, QUANTITATIVE: D-Dimer, Quant: 0.36 mcg/mL FEU (ref <0.50)

## 2021-11-08 NOTE — Assessment & Plan Note (Signed)
There were minor superficial varices but no pitting edema today. Plan-lab for CBC with differential, BMP,BMETt, D-dimer

## 2021-11-08 NOTE — Assessment & Plan Note (Signed)
Benefits from CPAP with good compliance and control Plan-continue auto 5-15

## 2021-11-08 NOTE — Assessment & Plan Note (Signed)
Has had some bronchitis in the past, now improved.  Little evidence of sustained obstructive change despite long smoking history.

## 2021-11-08 NOTE — Patient Instructions (Signed)
We can continue CPAP auto 5-15  Order- lab- CBC w diff, BMET, BNP, D-dimer   dx dyspnea on exertion, peripheral edema  Please talk with Dr Jenny Reichmann about atherosclerosis. He might ask you to see a cardiologist.

## 2021-11-09 LAB — PRO B NATRIURETIC PEPTIDE: NT-Pro BNP: 65 pg/mL (ref 0–301)

## 2021-11-13 ENCOUNTER — Encounter: Payer: Self-pay | Admitting: Internal Medicine

## 2021-11-13 ENCOUNTER — Other Ambulatory Visit: Payer: Self-pay

## 2021-11-13 ENCOUNTER — Ambulatory Visit (INDEPENDENT_AMBULATORY_CARE_PROVIDER_SITE_OTHER): Payer: Medicare HMO | Admitting: Internal Medicine

## 2021-11-13 VITALS — BP 138/70 | HR 67 | Ht 63.0 in | Wt 202.0 lb

## 2021-11-13 DIAGNOSIS — I10 Essential (primary) hypertension: Secondary | ICD-10-CM

## 2021-11-13 DIAGNOSIS — I700 Atherosclerosis of aorta: Secondary | ICD-10-CM | POA: Insufficient documentation

## 2021-11-13 DIAGNOSIS — R7302 Impaired glucose tolerance (oral): Secondary | ICD-10-CM | POA: Diagnosis not present

## 2021-11-13 DIAGNOSIS — I2510 Atherosclerotic heart disease of native coronary artery without angina pectoris: Secondary | ICD-10-CM

## 2021-11-13 DIAGNOSIS — Z0001 Encounter for general adult medical examination with abnormal findings: Secondary | ICD-10-CM | POA: Diagnosis not present

## 2021-11-13 DIAGNOSIS — E7800 Pure hypercholesterolemia, unspecified: Secondary | ICD-10-CM

## 2021-11-13 DIAGNOSIS — R609 Edema, unspecified: Secondary | ICD-10-CM

## 2021-11-13 DIAGNOSIS — Z87891 Personal history of nicotine dependence: Secondary | ICD-10-CM

## 2021-11-13 DIAGNOSIS — R0789 Other chest pain: Secondary | ICD-10-CM

## 2021-11-13 DIAGNOSIS — E538 Deficiency of other specified B group vitamins: Secondary | ICD-10-CM | POA: Diagnosis not present

## 2021-11-13 DIAGNOSIS — R931 Abnormal findings on diagnostic imaging of heart and coronary circulation: Secondary | ICD-10-CM | POA: Insufficient documentation

## 2021-11-13 MED ORDER — LOSARTAN POTASSIUM 50 MG PO TABS: ORAL_TABLET | ORAL | 3 refills | Status: DC

## 2021-11-13 MED ORDER — ATORVASTATIN CALCIUM 20 MG PO TABS: 20.0000 mg | ORAL_TABLET | Freq: Every day | ORAL | 3 refills | Status: DC

## 2021-11-13 NOTE — Patient Instructions (Addendum)
We have discussed the Cardiac CT Score test to measure the calcification level (if any) in your heart arteries.  This test has been ordered in our [facility], so please call [facility] directly, as they prefer this, at [phone] to be scheduled.  Please continue the aspirin   Please take all new medication as prescribed  - the lipitor 20 mg per day  Please continue all other medications as before, and refills have been done if requested - the losartan  Please have the pharmacy call with any other refills you may need.  Please continue your efforts at being more active, low cholesterol diet, and weight control.  You are otherwise up to date with prevention measures today.  Please keep your appointments with your specialists as you may have planned - pulmonary and urology  No further lab work needed today  Please make an Appointment to return in 6 months, or sooner if needed

## 2021-11-13 NOTE — Progress Notes (Signed)
Patient ID: Crystal [name], female   DOB: 07-29-1951, 71 y.o.   MRN: [id number]         Chief Complaint:: wellness exam and Office Visit (Discuss lung imaging and leg swelling)  And hyperlidemia       HPI:  Crystal [name] is a 71 y.o. female here for wellness exam; decliens covid booster, shingrix o/w up to date                        Also next prolia due apr 10, quit smoking oct 2022, now taking asa 81 mg in light of known Cor Art Calcifications on CT, willing to start statin, willing to have Card Ct score, and Pt denies chest pain, increased sob or doe, wheezing, orthopnea, PND, increased LE swelling, palpitations, dizziness or syncope, except for LLE swelling with a known left knee recurring baker cyst.   Pt denies polydipsia, polyuria, or new focal neuro s/s.   Pt denies fever, wt loss, night sweats, loss of appetite, or other constitutional symptoms    Wt Readings from Last 3 Encounters:  11/13/21 202 lb (91.6 kg)  11/08/21 199 lb 9.6 oz (90.5 kg)  09/04/21 191 lb (86.6 kg)   BP Readings from Last 3 Encounters:  11/13/21 138/70  11/08/21 124/78  09/04/21 122/68   Immunization History  Administered Date(s) Administered   Hepatitis B, adult 01/23/2017, 02/23/2017   Hepatitis B, ped/adol 07/27/2017   PFIZER Comirnaty(Gray Top)Covid-19 Tri-Sucrose Vaccine 02/10/2021   PFIZER(Purple Top)SARS-COV-2 Vaccination 12/13/2019, 12/27/2019, 08/25/2020   Pneumococcal Conjugate-13 03/05/2016   Pneumococcal Polysaccharide-23 03/11/2017   Td 12/21/2006   Tdap 02/17/2013  There are no preventive care reminders to display for this patient.    Past Medical History:  Diagnosis Date   ALLERGIC RHINITIS    Allergy    ANXIETY    no medations needed   Arthritis    Asthma    Bronchitis    Cataract    no surgery   COLONIC POLYPS, HX OF    Complication of anesthesia    oxygen sat dropped during endoscopy    COPD    pt unsure of this dx   Cough    DEPRESSION    Fatty liver    GERD     GLUCOSE INTOLERANCE    HYPERTENSION    IBS    Impaired glucose tolerance    Obstructive sleep apnea 06/25/2018   OSTEOPENIA    Osteopenia    Osteoporosis 07/30/2017   PERIPHERAL EDEMA    Pneumonia    Pneumonia, organism unspecified(486)    Sleep apnea    Oral Aparatus   TOBACCO USE DISORDER/SMOKER-SMOKING CESSATION DISCUSSED    Past Surgical History:  Procedure Laterality Date   birthmark removed     from back as a child   bladder mesh     2012   CESAREAN SECTION     x2   CHOLECYSTECTOMY     COLONOSCOPY  2018   TA   MOUTH SURGERY  04/25/2020   tissue graft -room of mouth to front gums   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   Procedure: LEFT TOTAL HIP ARTHROPLASTY ANTERIOR APPROACH;  Surgeon: Crystal Nakayama, MD;  Location: [facility];  Service: Orthopedics;  Laterality: Left;   WISDOM TOOTH EXTRACTION      reports that she quit smoking about 3 months ago. Her smoking use included cigarettes. She started smoking about 44 years ago. She has a 42.00 pack-year smoking history.  She has never used smokeless tobacco. She reports current alcohol use of about 21.0 standard drinks per week. She reports that she does not use drugs. family history includes Allergic rhinitis in her sister; Anxiety disorder in an other family member; Bladder Cancer in her father; Breast cancer in her sister; Breast cancer (age of onset: 94) in her sister; Cervical cancer in her sister; Colon cancer in her father and paternal grandfather; Colon polyps in her father; Diabetes in an other family member; Heart attack (age of onset: 26) in her maternal grandmother; Heart attack (age of onset: 64) in her maternal uncle; Hyperlipidemia in an other family member; Hypothyroidism in an other family member; Melanoma in her paternal grandfather; Other in her mother; Skin cancer in her brother, father, and mother; Thyroid cancer (age of onset: 110) in her paternal grandmother. Allergies  Allergen Reactions   Ace Inhibitors Cough    Penicillins Rash   Current Outpatient Medications on File Prior to Visit  Medication Sig Dispense Refill   albuterol (VENTOLIN HFA) 108 (90 Base) MCG/ACT inhaler Inhale 1-2 puffs into the lungs every 4 (four) hours as needed for wheezing or shortness of breath (coughing). 18 g 1   Calcium Carb-Cholecalciferol 600-800 MG-UNIT TABS Take 1 tablet by mouth in the morning and at bedtime.      Cholecalciferol (VITAMIN D PO) Take 2,000 Units by mouth daily.      denosumab (PROLIA) 60 MG/ML SOSY injection Inject 60 mg into the skin every 6 (six) months.     folic acid (FOLVITE) 177 MCG tablet Take 400 mcg by mouth daily.     metoprolol succinate (TOPROL-XL) 25 MG 24 hr tablet TAKE 1 TABLET BY MOUTH EVERY DAY 90 tablet 3   vitamin B-12 (CYANOCOBALAMIN) 1000 MCG tablet Take 1 tablet (1,000 mcg total) by mouth daily. 90 tablet 3   No current facility-administered medications on file prior to visit.        ROS:  All others reviewed and negative.  Objective        PE:  BP 138/70 (BP Location: Right Arm, Patient Position: Sitting, Cuff Size: Large)    Pulse 67    Ht 5' 3"  (1.6 m)    Wt 202 lb (91.6 kg)    SpO2 99%    BMI 35.78 kg/m                 Constitutional: Pt appears in NAD               HENT: Head: NCAT.                Right Ear: External ear normal.                 Left Ear: External ear normal.                Eyes: . Pupils are equal, round, and reactive to light. Conjunctivae and EOM are normal               Nose: without d/c or deformity               Neck: Neck supple. Gross normal ROM               Cardiovascular: Normal rate and regular rhythm.                 Pulmonary/Chest: Effort normal and breath sounds without rales or wheezing.  Abd:  Soft, NT, ND, + BS, no organomegaly               Neurological: Pt is alert. At baseline orientation, motor grossly intact               Skin: Skin is warm. No rashes, no other new lesions, LE edema - trace to 1+ LLE edema only  and post left knee baker cyst               Psychiatric: Pt behavior is normal without agitation   Micro: none  Cardiac tracings I have personally interpreted today:  ECG - SR 68  Pertinent Radiological findings (summarize): none   Lab Results  Component Value Date   WBC 5.5 11/08/2021   HGB 13.4 11/08/2021   HCT 40.8 11/08/2021   PLT 245.0 11/08/2021   GLUCOSE 97 11/08/2021   CHOL 195 03/11/2021   TRIG 61.0 03/11/2021   HDL 82.70 03/11/2021   LDLDIRECT 82.2 07/01/2013   LDLCALC 100 (H) 03/11/2021   ALT 10 03/11/2021   AST 15 03/11/2021   NA 137 11/08/2021   K 4.9 11/08/2021   CL 103 11/08/2021   CREATININE 0.70 11/08/2021   BUN 17 11/08/2021   CO2 29 11/08/2021   TSH 1.10 03/11/2021   INR 1.0 08/31/2020   HGBA1C 5.5 03/11/2021   Assessment/Plan:  Crystal [name] is a 71 y.o. White or Caucasian [1] female with  has a past medical history of ALLERGIC RHINITIS, Allergy, ANXIETY, Arthritis, Asthma, Bronchitis, Cataract, COLONIC POLYPS, HX OF, Complication of anesthesia, COPD, Cough, DEPRESSION, Fatty liver, GERD, GLUCOSE INTOLERANCE, HYPERTENSION, IBS, Impaired glucose tolerance, Obstructive sleep apnea (06/25/2018), OSTEOPENIA, Osteopenia, Osteoporosis (07/30/2017), PERIPHERAL EDEMA, Pneumonia, Pneumonia, organism unspecified(486), Sleep apnea, and TOBACCO USE DISORDER/SMOKER-SMOKING CESSATION DISCUSSED.  Encounter for well adult exam with abnormal findings Age and sex appropriate education and counseling updated with regular exercise and diet Referrals for preventative services - none needed Immunizations addressed - declines shingrix and covid booster Smoking counseling  - none needed Evidence for depression or other mood disorder - none significant Most recent labs reviewed. I have personally reviewed and have noted: 1) the patient's medical and social history 2) The patient's current medications and supplements 3) The patient's height, weight, and BMI have been  recorded in the chart   B12 deficiency Lab Results  Component Value Date   VITAMINB12 1,012 (H) 03/11/2021   Stable, cont oral replacement - b12 1000 mcg qd   Coronary artery calcification seen on CAT scan Pt to continue asa, also for quantification with Card CT score, ecg reviewed  HLD (hyperlipidemia) Lab Results  Component Value Date   LDLCALC 100 (H) 03/11/2021   Uncontrolled, goal ldl < 70, pt to start lipitor 20, cont low chol diet   Aortic atherosclerosis ([place]) Pt to continue low chol diet, exercise, start lipitor 20, cont asa 81  Essential hypertension BP Readings from Last 3 Encounters:  11/13/21 138/70  11/08/21 124/78  09/04/21 122/68   Stable, pt to continue medical treatment toprol, losartan   Impaired glucose tolerance Lab Results  Component Value Date   HGBA1C 5.5 03/11/2021   Stable, pt to continue current medical treatment  - diet   Former smoker Now quit oct 2022 - encouraged contd abstinence  PERIPHERAL EDEMA C/w venous insufficiency +/- baker cyst related, cont current tx, leg elevation, low salt diet,  Compression stocking  Followup: Return in about 6 months (around 05/13/2022).  Crystal Cower, MD 11/17/2021 6:59 AM  [facility]

## 2021-11-14 DIAGNOSIS — R350 Frequency of micturition: Secondary | ICD-10-CM | POA: Diagnosis not present

## 2021-11-14 DIAGNOSIS — N3946 Mixed incontinence: Secondary | ICD-10-CM | POA: Diagnosis not present

## 2021-11-17 ENCOUNTER — Encounter: Payer: Self-pay | Admitting: Internal Medicine

## 2021-11-17 DIAGNOSIS — E785 Hyperlipidemia, unspecified: Secondary | ICD-10-CM | POA: Insufficient documentation

## 2021-11-17 NOTE — Assessment & Plan Note (Signed)
Age and sex appropriate education and counseling updated with regular exercise and diet °Referrals for preventative services - none needed °Immunizations addressed - declines shingrix and covid booster °Smoking counseling  - none needed °Evidence for depression or other mood disorder - none significant °Most recent labs reviewed. °I have personally reviewed and have noted: °1) the patient's medical and social history °2) The patient's current medications and supplements °3) The patient's height, weight, and BMI have been recorded in the chart ° °

## 2021-11-17 NOTE — Assessment & Plan Note (Signed)
Lab Results  Component Value Date   LDLCALC 100 (H) 03/11/2021   Uncontrolled, goal ldl < 70, pt to start lipitor 20, cont low chol diet

## 2021-11-17 NOTE — Assessment & Plan Note (Signed)
Now quit oct 2022 - encouraged contd abstinence

## 2021-11-17 NOTE — Assessment & Plan Note (Addendum)
Pt to continue asa, also for quantification with Card CT score, ecg reviewed

## 2021-11-17 NOTE — Assessment & Plan Note (Signed)
Lab Results  Component Value Date   HGBA1C 5.5 03/11/2021   Stable, pt to continue current medical treatment  - diet

## 2021-11-17 NOTE — Assessment & Plan Note (Signed)
BP Readings from Last 3 Encounters:  11/13/21 138/70  11/08/21 124/78  09/04/21 122/68   Stable, pt to continue medical treatment toprol, losartan

## 2021-11-17 NOTE — Assessment & Plan Note (Signed)
C/w venous insufficiency +/- baker cyst related, cont current tx, leg elevation, low salt diet,  Compression stocking

## 2021-11-17 NOTE — Assessment & Plan Note (Signed)
Lab Results  Component Value Date   VITAMINB12 1,012 (H) 03/11/2021   Stable, cont oral replacement - b12 1000 mcg qd

## 2021-11-17 NOTE — Assessment & Plan Note (Signed)
Pt to continue low chol diet, exercise, start lipitor 20, cont asa 81

## 2021-11-18 ENCOUNTER — Encounter: Payer: Self-pay | Admitting: Internal Medicine

## 2021-11-21 ENCOUNTER — Telehealth: Payer: Self-pay | Admitting: Student-PharmD

## 2021-11-21 NOTE — Telephone Encounter (Signed)
Crystal [name] is a 71 y.o. female and was referred to the pharmacist telephone-based smoking cessation service by pulmonologist Dr. Annamaria Boots.  Crystal [name] has successfully quit smoking as of 08/01/21. She experienced skin irritation with multiple brands of nicotine patches, so she is currently using a combination of nicotine gum and lozenges throughout the day.   I spoke with Crystal [name] today and she reports rarely thinking of smoking but still never wanting to act on it. She is down to using 2-4 lozenges/gum per day just as needed. She tries to push through the desire for lozenge/gum to try to wean herself off of them but if she is still feeling the temptation she will use it.   Current tobacco use: None (quit 08/01/21)  Tobacco Use History -Baseline tobacco use as of 06/28/21: 20 cigarettes/day (1 ppd) -Time to first cigarette: < 30 minutes -Started smoking at 71 years old  Quit Attempt History  -Have you tried to quit in the past? Yes. Quit smoking for 11 months previously, but when mother moved in with her from the nursing home she started smoking again from the stress, and then her mother passed away 3 months later. Longest time other than that was 6 or 8 weeks about 20 years ago.  -Most recent quit attempt: February 2017 - January 2018  -Longest time ever been tobacco free: 11 months -What helped? In 2017 she used [facility]'s smoking cessation program. Her target quit date at that time was the anniversary of husband's death (died at 72 of massive heart attack in 2008). She used to smoke 2 ppd when her husband was alive (currently 1 ppd). At that time, she used nicotine gum and patches, which worked really well for her. She also would keep a paper on her wall in her office that she would write the number of days it had been since she had quit which she found very motivating.  -What was difficult? Stress of 24/7 care of mother followed by her mother's passing   Tobacco Use Habits: -Triggers  include getting together with a group that smokes. Working at home (works 1-2 hours, takes break to go smoke). More of a habit. Excitement - celebrating accomplishment/getting work done. Smoking when driving - doesn't bring cigarettes in her car anymore so she has stopped this. She gets edgy if she haven't smoked in a while. At least 1.5-2 hours when she starts craving. In the morning she smokes 2 or 3 cigarettes in the first hour and a half of waking up. Then it's usually 1 cigarette about every hour through the day.  -Does not wake at night to smoke. Has cravings but doesn't get up to do it. Reports she hasn't done that in years.  -Alcohol use: Drinks 1 glass of wine/night, fifth of vodka in a week. When drinking, she smokes more.  -Other smokers in household or daily life: None (lives alone). Daughter smokes but lives in [place]. Son vapes.  Identify social support: Siblings are her biggest supporters (2 brothers, 2 sisters). Older brother has quit for >30 years now and encourages her to smoke. He is her only sibling that is local to [place]. Best friend - she and her husband quit smoking 15 years ago - could reach out to her or her husband for support.   On a scale of 1-10, how CONFIDENT are you that you will successfully quit: 9 (8-10)  -Barriers/concerns: Boredom - finding something to replace time with.   On a scale of 1-10,  how IMPORTANT is it to you that you quit: 10 -Motivators: She is getting braces soon to correct her teeth/bite that have shifted since using a sleep apnea mouth piece, financial (spend $250/month on cigarettes), she would love to surprise her brother that she has quit smoking, health and longevity (husband died from a heart attack and was a heavy smoker, grandsons are 54 and 5 and she wants to be around to watch them graduate high school and college)   Past pharmacotherapy trials:  [x]  Nicotine gum: worked well, quit x 11 months []  Nicotine lozenge [x]  Nicotine patch: worked well,  quit x 11 months; during current quit attempt, experiences skin reactions with multiple brands and dose strengths []  Nicotine inhaler []  Nicotine nasal spray []  Bupropion (Zyban) [x]  Varenicline (Chantix): Tried Chantix once at a difficult time in life (work, husband, finances), found that it did not work well, had more "blue spells."   Current Outpatient Medications  Medication Instructions   albuterol (VENTOLIN HFA) 108 (90 Base) MCG/ACT inhaler 1-2 puffs, Inhalation, Every 4 hours PRN   atorvastatin (LIPITOR) 20 mg, Oral, Daily   Calcium Carb-Cholecalciferol 600-800 MG-UNIT TABS 1 tablet, Oral, 2 times daily   Cholecalciferol (VITAMIN D PO) 2,000 Units, Oral, Daily   denosumab (PROLIA) 60 mg, Subcutaneous, Every 6 months   folic acid (FOLVITE) 482 mcg, Oral, Daily   losartan (COZAAR) 50 MG tablet TAKE 1 TABLET EVERY DAY (ANNUAL APPOINTMENT DUE IN MAY, MUST SEE MD FOR FUTURE REFILLS)   metoprolol succinate (TOPROL-XL) 25 MG 24 hr tablet TAKE 1 TABLET BY MOUTH EVERY DAY   vitamin B-12 (CYANOCOBALAMIN) 1,000 mcg, Oral, Daily    Assessment/Plan: Crystal [name] has successfully quit smoking since 08/01/21. I congratulated her on her success with continuing to not smoke. We discussed working slowly toward weaning off of nicotine replacement but not hesitating to use it when she needs it.   Follow up phone call in 6 weeks.   Time spent: 10 mins  Rebbeca Paul, PharmD PGY2 Ambulatory Care Pharmacy Resident 11/21/2021 1:04 PM

## 2021-11-21 NOTE — Progress Notes (Signed)
Crystal [name] [place] Phone: [phone] Subjective:   Crystal [name], am serving as a scribe for Dr. Hulan Saas. This visit occurred during the SARS-CoV-2 public health emergency.  Safety protocols were in place, including screening questions prior to the visit, additional usage of staff PPE, and extensive cleaning of exam room while observing appropriate contact time as indicated for disinfecting solutions.  I'm seeing this patient by the request  of:  Biagio Borg, MD  CC: Left knee and leg pain  [id number]  08/26/2021 Chronic problem with exacerbation.  Patient given a possibility of drainage but patient Baker's cyst was very small.  Patient at this point we will continue with the conservative therapy with the icing regimen, bracing, topical anti-inflammatories.  If worsening pain patient could be a candidate for the viscosupplementation.  Patient will follow up with me again in 3 months otherwise.  Update 11/27/2021 Crystal [name] is a 71 y.o. female coming in with complaint of L knee pain.  Pain was worse over Christmas but pain has improved despite swelling. Most painful with full knee flexion. Notes catching in lateral aspect of knee.        Past Medical History:  Diagnosis Date   ALLERGIC RHINITIS    Allergy    ANXIETY    [name] medations needed   Arthritis    Asthma    Bronchitis    Cataract    [name] surgery   COLONIC POLYPS, HX OF    Complication of anesthesia    oxygen sat dropped during endoscopy    COPD    pt unsure of this dx   Cough    DEPRESSION    Fatty liver    GERD    GLUCOSE INTOLERANCE    HYPERTENSION    IBS    Impaired glucose tolerance    Obstructive sleep apnea 06/25/2018   OSTEOPENIA    Osteopenia    Osteoporosis 07/30/2017   PERIPHERAL EDEMA    Pneumonia    Pneumonia, organism unspecified(486)    Sleep apnea    Oral Aparatus   TOBACCO USE DISORDER/SMOKER-SMOKING CESSATION  DISCUSSED    Past Surgical History:  Procedure Laterality Date   birthmark removed     from back as a child   bladder mesh     2012   CESAREAN SECTION     x2   CHOLECYSTECTOMY     COLONOSCOPY  2018   TA   MOUTH SURGERY  04/25/2020   tissue graft -room of mouth to front gums   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   Procedure: LEFT TOTAL HIP ARTHROPLASTY ANTERIOR APPROACH;  Surgeon: Melrose Nakayama, MD;  Location: [facility];  Service: Orthopedics;  Laterality: Left;   WISDOM TOOTH EXTRACTION     Social History   Socioeconomic History   Marital status: Widowed    Spouse name: Not on file   Number of children: 2   Years of education: Not on file   Highest education level: Not on file  Occupational History   Occupation: Occupational psychologist industries, signage for [employer]  Tobacco Use   Smoking status: Former    Packs/day: 1.00    Years: 42.00    Pack years: 42.00    Types: Cigarettes    Start date: 10/20/1977    Quit date: 08/01/2021    Years since quitting: 0.3   Smokeless tobacco: Never  Vaping Use   Vaping Use: Never used  Substance and Sexual Activity   Alcohol use: Yes    Alcohol/week: 21.0 standard drinks    Types: 7 Glasses of wine, 14 Standard drinks or equivalent per week    Comment: couple of drinks 1-2 nites per week    Drug use: [name]   Sexual activity: Not on file  Other Topics Concern   Not on file  Social History Narrative   Not on file   Social Determinants of Health   Financial Resource Strain: Not on file  Food Insecurity: Not on file  Transportation Needs: Not on file  Physical Activity: Not on file  Stress: Not on file  Social Connections: Not on file   Allergies  Allergen Reactions   Ace Inhibitors Cough   Penicillins Rash   Family History  Problem Relation Age of Onset   Breast cancer Sister 45       +lump and tamoxifen; eventually had bilateral mastectomies   Allergic rhinitis Sister    Colon cancer Father        dx. >81    Bladder Cancer Father        dx. 65-79; not a smoker   Skin cancer Father        basal cell carcinoma   Colon polyps Father    Colon cancer Paternal Grandfather        unspecified age   Melanoma Paternal Grandfather        unspecified age   Skin cancer Mother        NOS; dx. 78s   Other Mother        hx of hysterectomy in her early 5s for fibroid cysts   Heart attack Maternal Uncle 67   Heart attack Maternal Grandmother 62   Thyroid cancer Paternal Grandmother 88   Breast cancer Sister        dx. early 34s; s/p BL mastectomies   Cervical cancer Sister        dx. late 32s   Skin cancer Brother        NOS   Anxiety disorder Other    Hypothyroidism Other    Hyperlipidemia Other    Diabetes Other    Esophageal cancer Neg Hx    Stomach cancer Neg Hx    Rectal cancer Neg Hx     Current Outpatient Medications (Endocrine & Metabolic):    denosumab (PROLIA) 60 MG/ML SOSY injection, Inject 60 mg into the skin every 6 (six) months.  Current Outpatient Medications (Cardiovascular):    atorvastatin (LIPITOR) 20 MG tablet, Take 1 tablet (20 mg total) by mouth daily.   losartan (COZAAR) 50 MG tablet, TAKE 1 TABLET EVERY DAY (ANNUAL APPOINTMENT DUE IN MAY, MUST SEE MD FOR FUTURE REFILLS)   metoprolol succinate (TOPROL-XL) 25 MG 24 hr tablet, TAKE 1 TABLET BY MOUTH EVERY DAY  Current Outpatient Medications (Respiratory):    albuterol (VENTOLIN HFA) 108 (90 Base) MCG/ACT inhaler, Inhale 1-2 puffs into the lungs every 4 (four) hours as needed for wheezing or shortness of breath (coughing).   Current Outpatient Medications (Hematological):    folic acid (FOLVITE) 510 MCG tablet, Take 400 mcg by mouth daily.   vitamin B-12 (CYANOCOBALAMIN) 1000 MCG tablet, Take 1 tablet (1,000 mcg total) by mouth daily.  Current Outpatient Medications (Other):    Calcium Carb-Cholecalciferol 600-800 MG-UNIT TABS, Take 1 tablet by mouth in the morning and at bedtime.    Cholecalciferol (VITAMIN D PO),  Take 2,000 Units by mouth daily.    mirabegron ER (MYRBETRIQ)  25 MG TB24 tablet, Take 25 mg by mouth daily.   Reviewed prior external information including notes and imaging from  primary care provider As well as notes that were available from care everywhere and other healthcare systems.  Past medical history, social, surgical and family history all reviewed in electronic medical record.  [name] pertanent information unless stated regarding to the chief complaint.   Review of Systems:  [name] headache, visual changes, nausea, vomiting, diarrhea, constipation, dizziness, abdominal pain, skin rash, fevers, chills, night sweats, weight loss, swollen lymph nodes, body aches, joint swelling, chest pain, shortness of breath, mood changes. POSITIVE muscle aches  Objective  Blood pressure 122/82, pulse 86, height 5' 3"  (1.6 m), weight 200 lb (90.7 kg), SpO2 98 %.   General: [name] apparent distress alert and oriented x3 mood and affect normal, dressed appropriately.  HEENT: Pupils equal, extraocular movements intact  Respiratory: Patient's speak in full sentences and does not appear short of breath  Cardiovascular: [name] lower extremity edema, non tender, [name] erythema  Severely antalgic.  Patient does have swelling of the left leg with [name] swelling of the lower extremity compared to the contralateral mild warmness as well.  Increasing discomfort symptoms.  Patient does have limited range of motion of the knee secondary to the effusion as well.  Instability noted with valgus and varus force.  Procedure: Real-time Ultrasound Guided Injection of left knee Device: GE Logiq Q7 Ultrasound guided injection is preferred based studies that show increased duration, increased effect, greater accuracy, decreased procedural pain, increased response rate, and decreased cost with ultrasound guided versus blind injection.  Verbal informed consent obtained.  Time-out conducted.  Noted [name] overlying erythema, induration, or other  signs of local infection.  Skin prepped in a sterile fashion.  Local anesthesia: Topical Ethyl chloride.  With sterile technique and under real time ultrasound guidance: With a 22-gauge 2 inch needle patient was injected with 4 cc of 0.5% Marcaine and aspirated 20 cc then injected 1 cc of Kenalog 40 mg/dL. This was from a superior lateral approach.  Completed without difficulty  Pain immediately resolved suggesting accurate placement of the medication.  Advised to call if fevers/chills, erythema, induration, drainage, or persistent bleeding.  Impression: Technically successful ultrasound guided injection.    Impression and Recommendations:     The above documentation has been reviewed and is accurate and complete Lyndal Pulley, DO

## 2021-11-27 ENCOUNTER — Ambulatory Visit: Payer: Medicare HMO | Admitting: Family Medicine

## 2021-11-27 ENCOUNTER — Other Ambulatory Visit: Payer: Self-pay

## 2021-11-27 ENCOUNTER — Ambulatory Visit: Payer: Self-pay

## 2021-11-27 ENCOUNTER — Encounter: Payer: Self-pay | Admitting: Family Medicine

## 2021-11-27 VITALS — BP 122/82 | HR 86 | Ht 63.0 in | Wt 200.0 lb

## 2021-11-27 DIAGNOSIS — M25562 Pain in left knee: Secondary | ICD-10-CM

## 2021-11-27 DIAGNOSIS — G8929 Other chronic pain: Secondary | ICD-10-CM

## 2021-11-27 DIAGNOSIS — M2550 Pain in unspecified joint: Secondary | ICD-10-CM | POA: Diagnosis not present

## 2021-11-27 DIAGNOSIS — M170 Bilateral primary osteoarthritis of knee: Secondary | ICD-10-CM | POA: Diagnosis not present

## 2021-11-27 NOTE — Patient Instructions (Addendum)
Drained knee  [facility]  ([facility] in [facility]) [address] [place], [place] [address] [phone] 11/28/2021 2:00pm, Arrive at 1:45pm  See me again in 4-5 weeks

## 2021-11-27 NOTE — Assessment & Plan Note (Signed)
Chronic exacerbation with acute flare  Does have arthritis May need baker cyst aspirated again as well.  May need visco  RTC in 4-6 weeks

## 2021-11-28 ENCOUNTER — Ambulatory Visit (HOSPITAL_COMMUNITY)
Admission: RE | Admit: 2021-11-28 | Discharge: 2021-11-28 | Disposition: A | Discharge status: Home / Self Care | Payer: Medicare HMO | Source: Ambulatory Visit | Attending: Cardiovascular Disease | Admitting: Cardiovascular Disease

## 2021-11-28 DIAGNOSIS — M25562 Pain in left knee: Secondary | ICD-10-CM | POA: Diagnosis not present

## 2021-11-28 DIAGNOSIS — M7989 Other specified soft tissue disorders: Secondary | ICD-10-CM | POA: Diagnosis not present

## 2021-11-28 DIAGNOSIS — G8929 Other chronic pain: Secondary | ICD-10-CM | POA: Insufficient documentation

## 2021-11-28 LAB — SYNOVIAL FLUID ANALYSIS, COMPLETE
Basophils, %: 0 %
Eosinophils-Synovial: 0 % (ref 0–2)
Lymphocytes-Synovial Fld: 74 % (ref 0–74)
Monocyte/Macrophage: 16 % (ref 0–69)
Neutrophil, Synovial: 8 % (ref 0–24)
Synoviocytes, %: 2 % (ref 0–15)
WBC, Synovial: 675 cells/uL — ABNORMAL HIGH (ref <150)

## 2021-11-29 ENCOUNTER — Encounter: Payer: Self-pay | Admitting: Family Medicine

## 2021-12-18 DIAGNOSIS — R931 Abnormal findings on diagnostic imaging of heart and coronary circulation: Secondary | ICD-10-CM

## 2021-12-18 HISTORY — DX: Abnormal findings on diagnostic imaging of heart and coronary circulation: R93.1

## 2021-12-25 IMAGING — DX DG CHEST 2V
2 series · 2 of 2 positions shown · non-contrast
Comparison: 08/31/2020

CLINICAL DATA: tobacco user

EXAM:
CHEST - 2 VIEW

[chest pa]
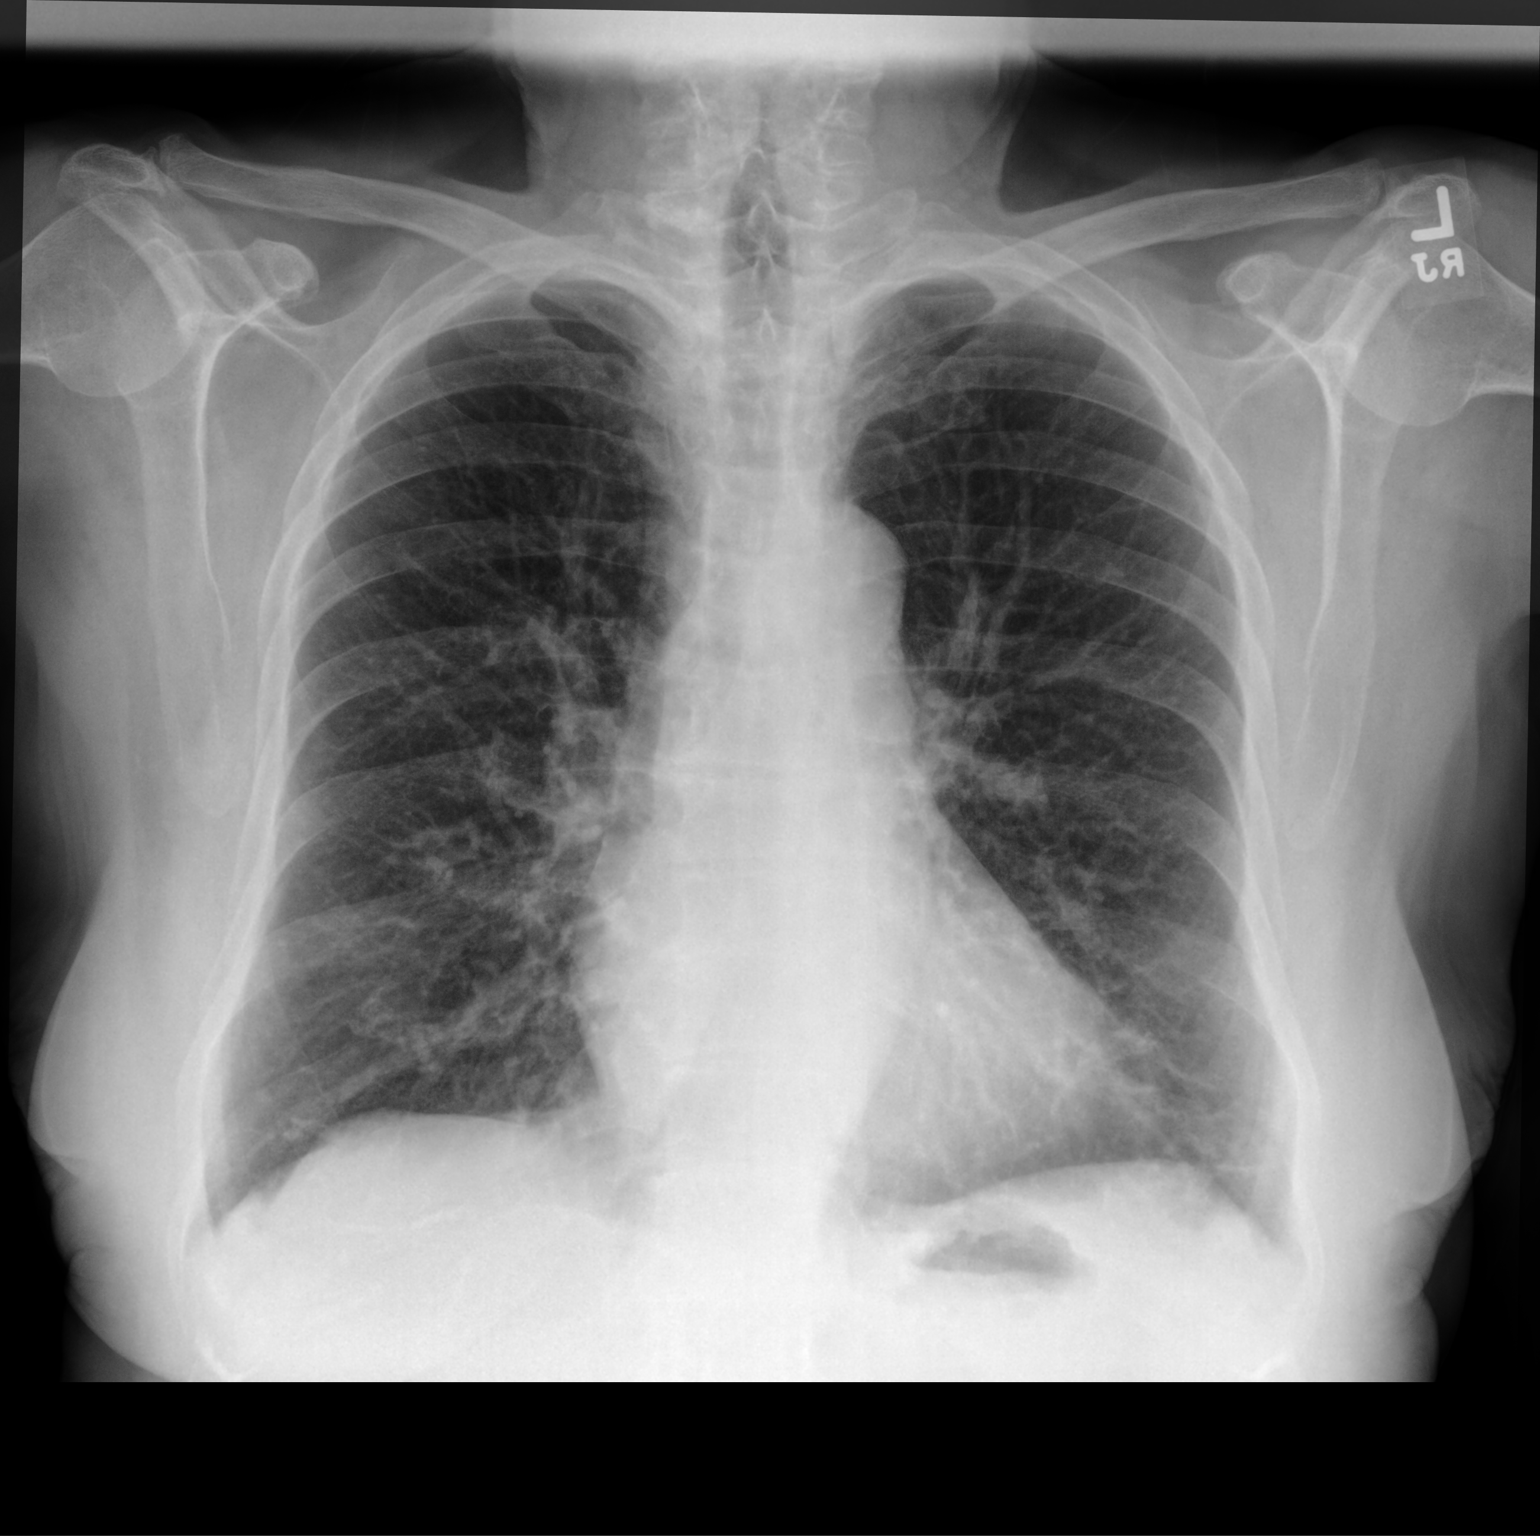

[chest lat]
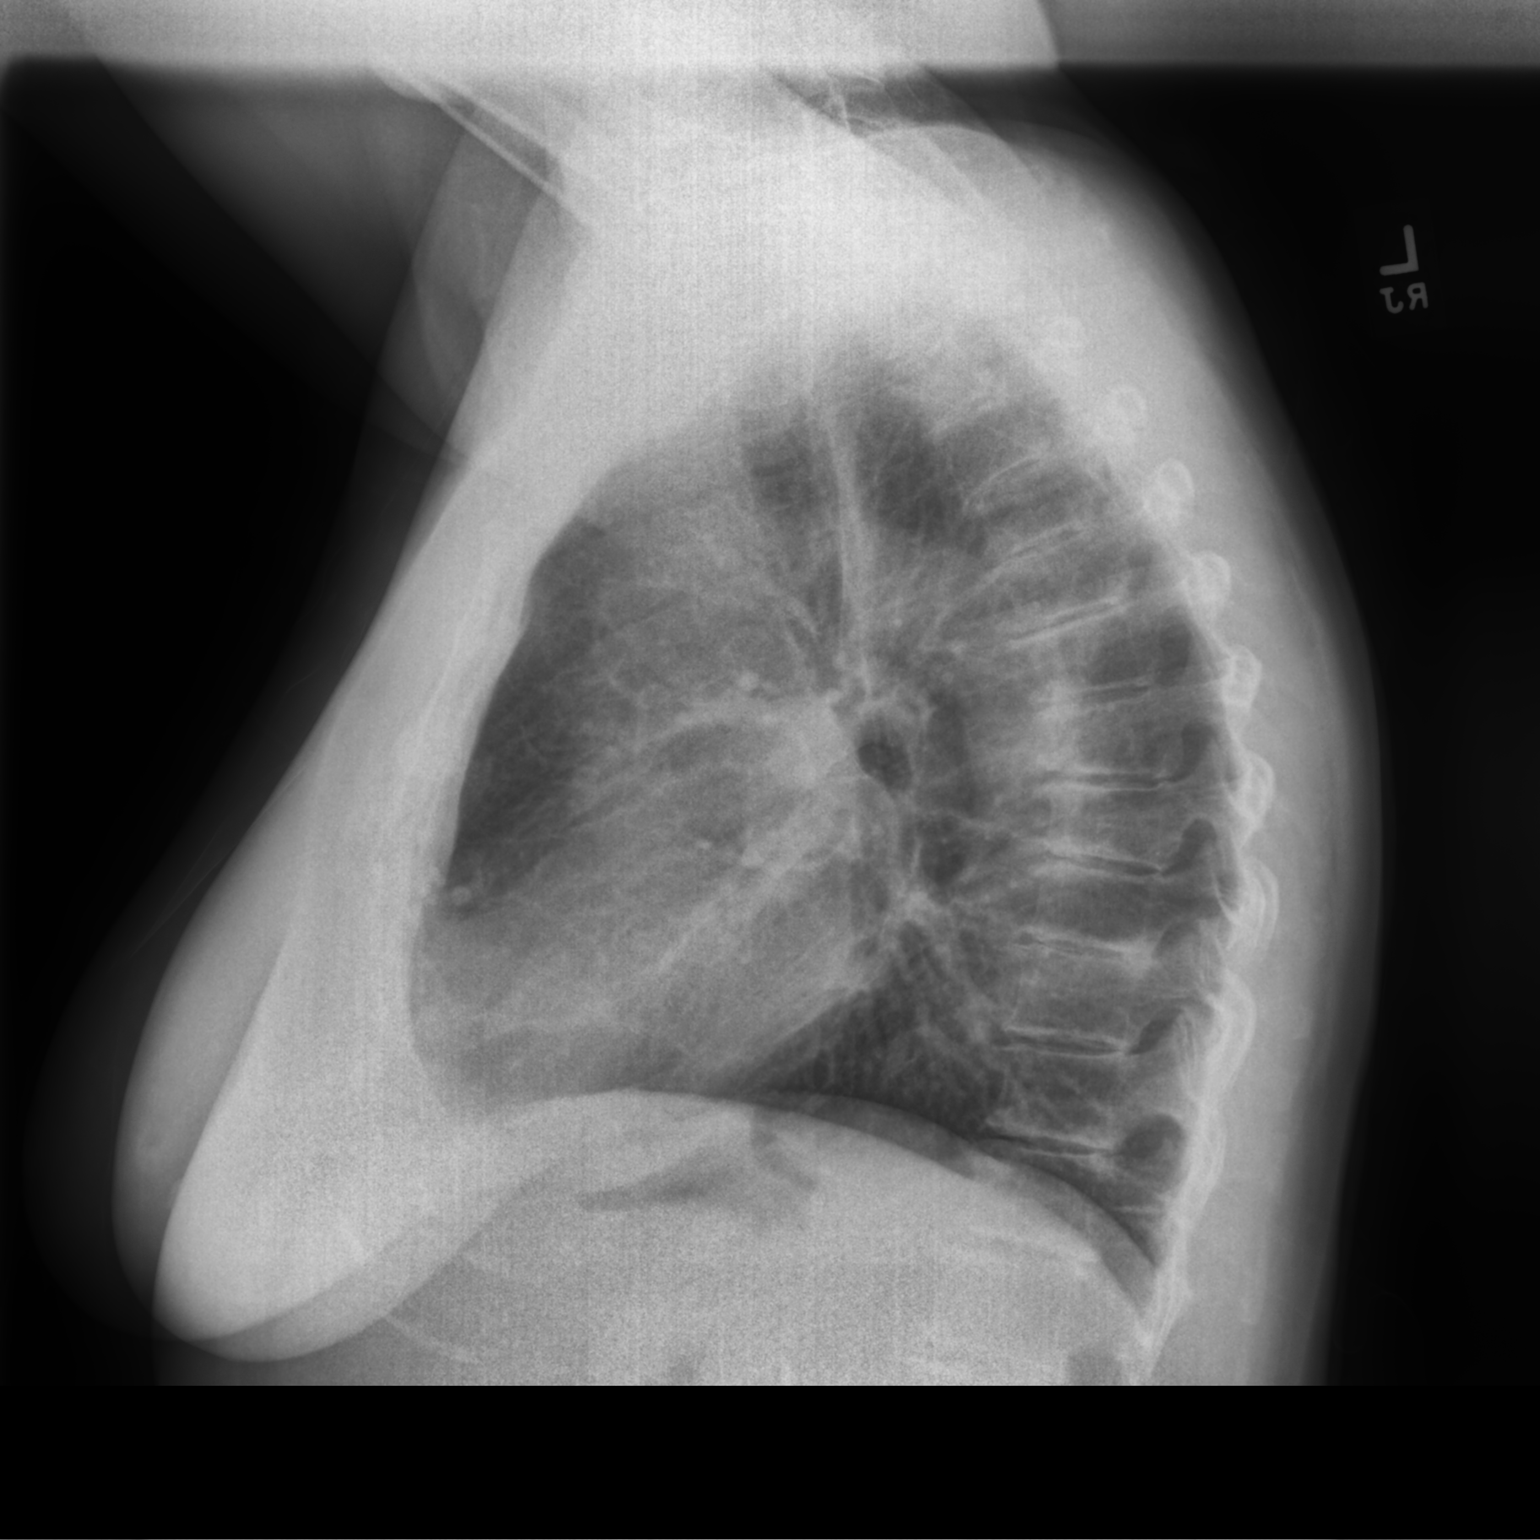

[2 of 2 positions shown; findings below may reference images not displayed]

FINDINGS: Frontal and lateral views of the chest demonstrate an unremarkable
cardiac silhouette. No airspace disease, effusion, or pneumothorax.
No acute bony abnormalities.
IMPRESSION: 1. No acute intrathoracic process.

## 2021-12-26 ENCOUNTER — Other Ambulatory Visit: Payer: Self-pay

## 2021-12-26 ENCOUNTER — Ambulatory Visit (INDEPENDENT_AMBULATORY_CARE_PROVIDER_SITE_OTHER)
Admission: RE | Admit: 2021-12-26 | Discharge: 2021-12-26 | Disposition: A | Discharge status: Home / Self Care | Payer: Self-pay | Source: Ambulatory Visit | Attending: Internal Medicine | Admitting: Internal Medicine

## 2021-12-26 DIAGNOSIS — I10 Essential (primary) hypertension: Secondary | ICD-10-CM

## 2021-12-26 DIAGNOSIS — E7800 Pure hypercholesterolemia, unspecified: Secondary | ICD-10-CM

## 2021-12-26 DIAGNOSIS — R7302 Impaired glucose tolerance (oral): Secondary | ICD-10-CM

## 2021-12-26 DIAGNOSIS — I700 Atherosclerosis of aorta: Secondary | ICD-10-CM

## 2021-12-27 NOTE — Progress Notes (Signed)
[facility] [address] [place] [address] Phone: [phone] Subjective:   Bruce Donath, am serving as a scribe for Dr. Antoine Primas.  This visit occurred during the SARS-CoV-2 public health emergency.  Safety protocols were in place, including screening questions prior to the visit, additional usage of staff PPE, and extensive cleaning of exam room while observing appropriate contact time as indicated for disinfecting solutions.   I'm seeing this patient by the request  of:  Corwin Levins, MD  CC: Left knee pain greater than right knee  OZH:[id number]  Crystal [name] is a 71 y.o. female coming in with complaint of B knee pain. Pain in L>R. L drained 11/27/2021. Patient states that since Friday she has had increase in pain in L knee. Pain over medial aspect with locking sensation over the weekend.   R knee pain started on Tuesday after water fitness.      Past Medical History:  Diagnosis Date   ALLERGIC RHINITIS    Allergy    ANXIETY    no medations needed   Arthritis    Asthma    Bronchitis    Cataract    no surgery   COLONIC POLYPS, HX OF    Complication of anesthesia    oxygen sat dropped during endoscopy    COPD    pt unsure of this dx   Cough    DEPRESSION    Fatty liver    GERD    GLUCOSE INTOLERANCE    HYPERTENSION    IBS    Impaired glucose tolerance    Obstructive sleep apnea 06/25/2018   OSTEOPENIA    Osteopenia    Osteoporosis 07/30/2017   PERIPHERAL EDEMA    Pneumonia    Pneumonia, organism unspecified(486)    Sleep apnea    Oral Aparatus   TOBACCO USE DISORDER/SMOKER-SMOKING CESSATION DISCUSSED    Past Surgical History:  Procedure Laterality Date   birthmark removed     from back as a child   bladder mesh     2012   CESAREAN SECTION     x2   CHOLECYSTECTOMY     COLONOSCOPY  2018   TA   MOUTH SURGERY  04/25/2020   tissue graft -room of mouth to front gums   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    Procedure: LEFT TOTAL HIP ARTHROPLASTY ANTERIOR APPROACH;  Surgeon: Marcene Corning, MD;  Location: [facility];  Service: Orthopedics;  Laterality: Left;   WISDOM TOOTH EXTRACTION     Social History   Socioeconomic History   Marital status: Widowed    Spouse name: Not on file   Number of children: 2   Years of education: Not on file   Highest education level: Not on file  Occupational History   Occupation: Data processing manager industries, signage for [employer]  Tobacco Use   Smoking status: Former    Packs/day: 1.00    Years: 42.00    Pack years: 42.00    Types: Cigarettes    Start date: 10/20/1977    Quit date: 08/01/2021    Years since quitting: 0.4   Smokeless tobacco: Never  Vaping Use   Vaping Use: Never used  Substance and Sexual Activity   Alcohol use: Yes    Alcohol/week: 21.0 standard drinks    Types: 7 Glasses of wine, 14 Standard drinks or equivalent per week    Comment: couple of drinks 1-2 nites per week    Drug  use: No   Sexual activity: Not on file  Other Topics Concern   Not on file  Social History Narrative   Not on file   Social Determinants of Health   Financial Resource Strain: Not on file  Food Insecurity: Not on file  Transportation Needs: Not on file  Physical Activity: Not on file  Stress: Not on file  Social Connections: Not on file   Allergies  Allergen Reactions   Ace Inhibitors Cough   Penicillins Rash   Family History  Problem Relation Age of Onset   Breast cancer Sister 24       +lump and tamoxifen; eventually had bilateral mastectomies   Allergic rhinitis Sister    Colon cancer Father        dx. >28   Bladder Cancer Father        dx. 39-79; not a smoker   Skin cancer Father        basal cell carcinoma   Colon polyps Father    Colon cancer Paternal Grandfather        unspecified age   Melanoma Paternal Grandfather        unspecified age   Skin cancer Mother        NOS; dx. 32s   Other Mother        hx of  hysterectomy in her early 40s for fibroid cysts   Heart attack Maternal Uncle 67   Heart attack Maternal Grandmother 23   Thyroid cancer Paternal Grandmother 10   Breast cancer Sister        dx. early 79s; s/p BL mastectomies   Cervical cancer Sister        dx. late 20s   Skin cancer Brother        NOS   Anxiety disorder Other    Hypothyroidism Other    Hyperlipidemia Other    Diabetes Other    Esophageal cancer Neg Hx    Stomach cancer Neg Hx    Rectal cancer Neg Hx     Current Outpatient Medications (Endocrine & Metabolic):    denosumab (PROLIA) 60 MG/ML SOSY injection, Inject 60 mg into the skin every 6 (six) months.  Current Outpatient Medications (Cardiovascular):    atorvastatin (LIPITOR) 20 MG tablet, Take 1 tablet (20 mg total) by mouth daily.   losartan (COZAAR) 50 MG tablet, TAKE 1 TABLET EVERY DAY (ANNUAL APPOINTMENT DUE IN MAY, MUST SEE MD FOR FUTURE REFILLS)   metoprolol succinate (TOPROL-XL) 25 MG 24 hr tablet, TAKE 1 TABLET BY MOUTH EVERY DAY  Current Outpatient Medications (Respiratory):    albuterol (VENTOLIN HFA) 108 (90 Base) MCG/ACT inhaler, Inhale 1-2 puffs into the lungs every 4 (four) hours as needed for wheezing or shortness of breath (coughing).   Current Outpatient Medications (Hematological):    folic acid (FOLVITE) 400 MCG tablet, Take 400 mcg by mouth daily.   vitamin B-12 (CYANOCOBALAMIN) 1000 MCG tablet, Take 1 tablet (1,000 mcg total) by mouth daily.  Current Outpatient Medications (Other):    Calcium Carb-Cholecalciferol 600-800 MG-UNIT TABS, Take 1 tablet by mouth in the morning and at bedtime.    Cholecalciferol (VITAMIN D PO), Take 2,000 Units by mouth daily.    mirabegron ER (MYRBETRIQ) 25 MG TB24 tablet, Take 25 mg by mouth daily.   Reviewed prior external information including notes and imaging from  primary care provider As well as notes that were available from care everywhere and other healthcare systems.  Past medical history,  social, surgical and  family history all reviewed in electronic medical record.  No pertanent information unless stated regarding to the chief complaint.   Review of Systems:  No headache, visual changes, nausea, vomiting, diarrhea, constipation, dizziness, abdominal pain, skin rash, fevers, chills, night sweats, weight loss, swollen lymph nodes, body aches, joint swelling, chest pain, shortness of breath, mood changes. POSITIVE muscle aches  Objective  Blood pressure (!) 150/82, pulse 86, height 5\' 3"  (1.6 m), weight 202 lb (91.6 kg), SpO2 98 %.   General: No apparent distress alert and oriented x3 mood and affect normal, dressed appropriately.  HEENT: Pupils equal, extraocular movements intact  Respiratory: Patient's speak in full sentences and does not appear short of breath  Severely antalgic.  Patient does have an effusion noted of the left knee again.  Lacks the last 10 degrees of flexion.  Instability with valgus and varus force  Procedure: Real-time Ultrasound Guided Injection of left knee Device: GE Logiq Q7 Ultrasound guided injection is preferred based studies that show increased duration, increased effect, greater accuracy, decreased procedural pain, increased response rate, and decreased cost with ultrasound guided versus blind injection.  Verbal informed consent obtained.  Time-out conducted.  Noted no overlying erythema, induration, or other signs of local infection.  Skin prepped in a sterile fashion.  Local anesthesia: Topical Ethyl chloride.  With sterile technique and under real time ultrasound guidance: With a 22-gauge 2 inch needle patient was injected with 4 cc of 0.5% Marcaine and aspirated 65 cc of straw light-colored fluid then injected 60 mg per 3 mL of Monovisc. This was from a superior lateral approach.  Completed without difficulty  Pain immediately resolved suggesting accurate placement of the medication.  Advised to call if fevers/chills, erythema, induration,  drainage, or persistent bleeding.  Impression: Technically successful ultrasound guided injection.    Impression and Recommendations:     The above documentation has been reviewed and is accurate and complete Judi Saa, DO

## 2021-12-28 NOTE — Telephone Encounter (Signed)
Prolia VOB initiated via [email or web address] ? ?Last OV:  ?Next OV:  ?Last Prolia inj: 07/29/21 ?Next Prolia inj DUE: 01/28/22 ? ?

## 2022-01-01 ENCOUNTER — Encounter: Payer: Self-pay | Admitting: Family Medicine

## 2022-01-01 ENCOUNTER — Other Ambulatory Visit: Payer: Self-pay | Admitting: Internal Medicine

## 2022-01-01 ENCOUNTER — Ambulatory Visit (INDEPENDENT_AMBULATORY_CARE_PROVIDER_SITE_OTHER): Payer: Medicare HMO | Admitting: Family Medicine

## 2022-01-01 ENCOUNTER — Ambulatory Visit: Payer: Self-pay

## 2022-01-01 ENCOUNTER — Other Ambulatory Visit: Payer: Self-pay

## 2022-01-01 VITALS — BP 150/82 | HR 86 | Ht 63.0 in | Wt 202.0 lb

## 2022-01-01 DIAGNOSIS — M1712 Unilateral primary osteoarthritis, left knee: Secondary | ICD-10-CM

## 2022-01-01 DIAGNOSIS — G8929 Other chronic pain: Secondary | ICD-10-CM

## 2022-01-01 DIAGNOSIS — R7889 Finding of other specified substances, not normally found in blood: Secondary | ICD-10-CM

## 2022-01-01 DIAGNOSIS — M25562 Pain in left knee: Secondary | ICD-10-CM

## 2022-01-01 DIAGNOSIS — M170 Bilateral primary osteoarthritis of knee: Secondary | ICD-10-CM

## 2022-01-01 DIAGNOSIS — R931 Abnormal findings on diagnostic imaging of heart and coronary circulation: Secondary | ICD-10-CM

## 2022-01-01 MED ORDER — ASPIRIN 81 MG PO TBEC: 81.0000 mg | DELAYED_RELEASE_TABLET | Freq: Every day | ORAL | 12 refills | Status: DC

## 2022-01-01 NOTE — Assessment & Plan Note (Signed)
Worsening pain again at this time.  Concerned that patient's arthritis is seems to be worsening.  Patient may need to consider the possibility of surgical intervention with replacement if this continues.  Patient did have significant amount of aspiration done today and hopefully the viscosupplementation will be helpful.  Patient will follow-up with me again in 6 weeks. ?

## 2022-01-01 NOTE — Patient Instructions (Addendum)
Monovisc for L knee today ?Drained it as well ?See me again in 5-6 weeks ?

## 2022-01-01 NOTE — Telephone Encounter (Signed)
Prior auth required for PROLIA ? ?PA PROCESS DETAILS: PA is required. PA can be initiated by calling [phone] or online at ?[email or web address] ? ?

## 2022-01-02 ENCOUNTER — Telehealth: Payer: Self-pay | Admitting: Student-PharmD

## 2022-01-02 NOTE — Telephone Encounter (Signed)
? ?Crystal [name] is a 71 y.o. female and was referred to the pharmacist telephone-based smoking cessation service by pulmonologist Dr. Annamaria Boots. ? ?Crystal [name] has successfully quit smoking as of 08/01/21. She experienced skin irritation with multiple brands of nicotine patches, so she is currently using a combination of nicotine gum and lozenges throughout the day.  ? ?I spoke with Crystal [name] today and she reports she no longer has thoughts of smoking. She has shifted her mindset to being a "nonsmoker." She uses 1-2 pieces of gum/lozenge as needed. She is frustrated with the weight gain she's experienced over the past few months despite no change in her diet and continuing to be active with water aerobics.  ? ?Current tobacco use: None (quit 08/01/21) ? ?Tobacco Use History ?-Baseline tobacco use as of 06/28/21: 20 cigarettes/day (1 ppd) ?-Time to first cigarette: < 30 minutes ?-Started smoking at 71 years old ? ?Quit Attempt History  ?-Have you tried to quit in the past? Yes. Quit smoking for 11 months previously, but when mother moved in with her from the nursing home she started smoking again from the stress, and then her mother passed away 3 months later. Longest time other than that was 6 or 8 weeks about 20 years ago.  ?-Most recent quit attempt: February 2017 - January 2018  ?-Longest time ever been tobacco free: 11 months ?-What helped? In 2017 she used [facility] smoking cessation program. Her target quit date at that time was the anniversary of husband's death (died at 38 of massive heart attack in 2008). She used to smoke 2 ppd when her husband was alive (currently 1 ppd). At that time, she used nicotine gum and patches, which worked really well for her. She also would keep a paper on her wall in her office that she would write the number of days it had been since she had quit which she found very motivating.  ?-What was difficult? Stress of 24/7 care of mother followed by her mother's passing  ? ?Tobacco Use  Habits: ?-Triggers include getting together with a group that smokes. Working at home (works 1-2 hours, takes break to go smoke). More of a habit. Excitement - celebrating accomplishment/getting work done. Smoking when driving - doesn't bring cigarettes in her car anymore so she has stopped this. She gets edgy if she haven't smoked in a while. At least 1.5-2 hours when she starts craving. In the morning she smokes 2 or 3 cigarettes in the first hour and a half of waking up. Then it's usually 1 cigarette about every hour through the day.  ?-Does not wake at night to smoke. Has cravings but doesn't get up to do it. Reports she hasn't done that in years.  ?-Alcohol use: Drinks 1 glass of wine/night, fifth of vodka in a week. When drinking, she smokes more.  ?-Other smokers in household or daily life: None (lives alone). Daughter smokes but lives in [place]. Son vapes.  ?Identify social support: Siblings are her biggest supporters (2 brothers, 2 sisters). Older brother has quit for >30 years now and encourages her to smoke. He is her only sibling that is local to [place]. Best friend - she and her husband quit smoking 15 years ago - could reach out to her or her husband for support.  ? ?On a scale of 1-10, how CONFIDENT are you that you will successfully quit: 9 (8-10)  ?-Barriers/concerns: Boredom - finding something to replace time with.  ? ?On a scale of 1-10, how  IMPORTANT is it to you that you quit: 10 ?-Motivators: She is getting braces soon to correct her teeth/bite that have shifted since using a sleep apnea mouth piece, financial (spend $250/month on cigarettes), she would love to surprise her brother that she has quit smoking, health and longevity (husband died from a heart attack and was a heavy smoker, grandsons are 35 and 5 and she wants to be around to watch them graduate high school and college)  ? ?Past pharmacotherapy trials:  ?[x]  Nicotine gum: worked well, quit x 11 months ?[]  Nicotine lozenge ?[x]  Nicotine  patch: worked well, quit x 11 months; during current quit attempt, experiences skin reactions with multiple brands and dose strengths ?[]  Nicotine inhaler ?[]  Nicotine nasal spray ?[]  Bupropion (Zyban) ?[x]  Varenicline (Chantix): Tried Chantix once at a difficult time in life (work, husband, finances), found that it did not work well, had more "blue spells."  ? ?Current Outpatient Medications  ?Medication Instructions  ? albuterol (VENTOLIN HFA) 108 (90 Base) MCG/ACT inhaler 1-2 puffs, Inhalation, Every 4 hours PRN  ? aspirin 81 mg, Oral, Daily, Swallow whole.  ? atorvastatin (LIPITOR) 20 mg, Oral, Daily  ? Calcium Carb-Cholecalciferol 600-800 MG-UNIT TABS 1 tablet, Oral, 2 times daily  ? Cholecalciferol (VITAMIN D PO) 2,000 Units, Oral, Daily  ? denosumab (PROLIA) 60 mg, Subcutaneous, Every 6 months  ? folic acid (FOLVITE) 832 mcg, Oral, Daily  ? losartan (COZAAR) 50 MG tablet TAKE 1 TABLET EVERY DAY (ANNUAL APPOINTMENT DUE IN MAY, MUST SEE MD FOR FUTURE REFILLS)  ? metoprolol succinate (TOPROL-XL) 25 MG 24 hr tablet TAKE 1 TABLET BY MOUTH EVERY DAY  ? mirabegron ER (MYRBETRIQ) 25 mg, Oral, Daily  ? vitamin B-12 (CYANOCOBALAMIN) 1,000 mcg, Oral, Daily  ? ? ?Assessment/Plan: ?Crystal [name] has successfully quit smoking since 08/01/21. I congratulated her on her success with continuing to not smoke. We discussed working slowly toward weaning off of nicotine replacement but not hesitating to use it when she needs it.  ? ?Follow up phone call in 8 weeks.  ? ?Time spent: 10 mins ? ?Rebbeca Paul, PharmD ?PGY2 Ambulatory Care Pharmacy Resident ?01/02/2022 4:05 PM ? ?

## 2022-01-03 ENCOUNTER — Other Ambulatory Visit: Payer: Self-pay

## 2022-01-03 ENCOUNTER — Ambulatory Visit: Payer: Medicare HMO | Admitting: Cardiology

## 2022-01-03 ENCOUNTER — Encounter: Payer: Self-pay | Admitting: Cardiology

## 2022-01-03 VITALS — BP 136/80 | HR 66 | Ht 63.0 in | Wt 203.0 lb

## 2022-01-03 DIAGNOSIS — I10 Essential (primary) hypertension: Secondary | ICD-10-CM | POA: Diagnosis not present

## 2022-01-03 DIAGNOSIS — I700 Atherosclerosis of aorta: Secondary | ICD-10-CM | POA: Diagnosis not present

## 2022-01-03 DIAGNOSIS — E785 Hyperlipidemia, unspecified: Secondary | ICD-10-CM

## 2022-01-03 DIAGNOSIS — G4733 Obstructive sleep apnea (adult) (pediatric): Secondary | ICD-10-CM | POA: Diagnosis not present

## 2022-01-03 DIAGNOSIS — R931 Abnormal findings on diagnostic imaging of heart and coronary circulation: Secondary | ICD-10-CM

## 2022-01-03 MED ORDER — ATORVASTATIN CALCIUM 40 MG PO TABS: 40.0000 mg | ORAL_TABLET | Freq: Every day | ORAL | 3 refills | Status: DC

## 2022-01-03 NOTE — Progress Notes (Signed)
Primary Care Provider: Corwin Levins, MD Cardiologist: Bryan Lemma, MD Electrophysiologist: None  Clinic Note: Chief Complaint  Patient presents with   New Patient (Initial Visit)    Elevated Coronary Calcium Score    ===================================  ASSESSMENT/PLAN   Problem List Items Addressed This Visit       Cardiology Problems   Elevated coronary artery calcium score-173 - Primary (Chronic)    Mild to moderate risk.    I spent close to 20 minutes explaining the pathophysiology of atherosclerosis and what coronary calcification means.  Multiple questions were asked and answered.  We talked about the importance of risk factor modification.   Step 1: smoking cessation,  encouraged continued abstinence Step 2:-> increasing exercise and adjusting diet =>  she is making an effort to exercise now and has also changed her diet. Step 3: Blood pressure control: Borderline pressure today at 136/80 on stable dose of ARB and beta-blocker. ->   Low threshold to increase ARB to 100 mg if pressures stay at this level. Step 4: Lipid control-LDL goal now preferably less than 70 (she has been stable and an LDL of roughly 100,. Recently started on statin by PCP.- I well increase the dose to atorvastatin 40 mg daily Step 5: Glycemic control: Most recent A1c 5.5 Hopefully with dietary changes and increased exercise, this will stay controlled.  She is taking aspirin which is not unreasonable, but arguably not necessary at this point.  Okay to hold for procedures or surgeries.      Relevant Medications   atorvastatin (LIPITOR) 40 MG tablet   Other Relevant Orders   EKG 12-Lead   Hyperlipidemia with target LDL less than 70 (Chronic)    Should be due for labs recheck soon.  Most recent LDL was 100. I increased her statin to 40 mg.  She should have labs checked in the next couple months by PCP.      Relevant Medications   atorvastatin (LIPITOR) 40 MG tablet   Essential  hypertension (Chronic)    Blood pressures have been pretty well controlled, but a little higher here today.  Monitor closely.  Low threshold to increase ARB up to 100 mg daily.  Continue current dose of Toprol.      Relevant Medications   atorvastatin (LIPITOR) 40 MG tablet   Aortic atherosclerosis (HCC) (Chronic)    Similar risk factor modification with increased exercise, cholesterol diet, initiation of statin, close lites and control blood pressure control.      Relevant Medications   atorvastatin (LIPITOR) 40 MG tablet     Other   Obstructive sleep apnea (Chronic)    Stressed the importance of CPAP compliance       ===================================  HPI:    Crystal [name] is an obese 71 y.o. female former smoker with HTN and HLD as well as "asthma" with a PMH below who presents today for who is being seen today for the evaluation of Elevated Coronary Calcium Score at the request of Corwin Levins, MD.  [name] was seen by Dr. Jonny Ruiz for routine annual screening on November 13, 2021.  She was proud to announce that she quit smoking as of October 2022.  She had gone for a screening CT scan for lung cancer that revealed coronary artery calcification.  She therefore started taking 81 mg aspirin.  About the only symptom she noted with some left lower extremity swelling from her known left knee Baker's cyst but no PND, orthopnea or extremity  edema.  She has some mild exertional dyspnea from deconditioning, obesity and long-term smoking. =>  Referred for Coronary Calcium Score, and started on Lipitor 20 mg daily.  Discussed low-cholesterol diet. Encouraged continued abstinence of smoking and adjusted diet. Recommend compression stockings for leg swelling Continued on current dose of Toprol 50 mg and losartan 50 mg  Recent Hospitalizations: None  Reviewed  CV studies:    The following studies were reviewed today: (if available, images/films reviewed: From Epic Chart or Care  Everywhere) Coronary Calcium Score 12/31/2021:: Total Agatston score 173 (LAD).  78th percentile for age-race and sex matched controls.  Aortic atherosclerosis noted.   Interval History:   [name] presents here today because of a Coronary Calcium Score of 173.  She has been started on a statin and is already on beta-blocker, ARB along with aspirin.  She is a little bit concerned, mostly had lots of questions that she wanted to ask.  She does acknowledge that she gained weight since smoking cessation, but is happy that she is no longer smoking.  She does have exertional dyspnea if she is walking up several flights of steps or going up an incline.  Otherwise does pretty well.  She has really been trying to adjust her lifestyle with diet changes but is also doing a lot of water aerobics 2 to 3 days a week.  She had been doing it up to 4 days a week, but hurt her back and had to scale down and exercise. Sometimes when she is doing her water aerobics she will have a sense of difficulty catching her breath and a tightness.  Usually if she is able to stop and get her chest above the water, she breathes fine.  She says the water aerobics is much better for her because of her knee pain.  She has chronic left knee swelling.  CV Review of Symptoms (Summary) Cardiovascular ROS: positive for - DOE with significant exertion - hard to catch her breath.  LLE swelling - mostly Knee - not true edema. negative for - chest pain, irregular heartbeat, orthopnea, palpitations, paroxysmal nocturnal dyspnea, rapid heart rate, shortness of breath, or lightheadedness, dizziness or wooziness, syncope/near syncope or TIA/amaurosis fugax, claudication.  REVIEWED OF SYSTEMS   Review of Systems  Constitutional:  Negative for malaise/fatigue (Energy level is definitely improving since she has been starting to exercise, but she is still deconditioned.) and weight loss (Not for lack of trying; she is actually gained about  10+ pounds since smoking cessation in October 2022).  HENT:  Negative for nosebleeds.   Respiratory:  Positive for shortness of breath (Only with significant exertion because of obesity and deconditioning-really notes that if she goes up steps or walks up an incline..  Long-term smoker). Negative for cough and wheezing.   Cardiovascular:  Negative for leg swelling (Trivial).  Gastrointestinal:  Negative for blood in stool and melena.  Genitourinary:  Negative for dysuria and hematuria.  Musculoskeletal:  Positive for joint pain. Negative for falls and myalgias.  Neurological:  Negative for dizziness, focal weakness and weakness.  Psychiatric/Behavioral:  Negative for depression. The patient is not nervous/anxious and does not have insomnia.    I have reviewed and (if needed) personally updated the patient's problem list, medications, allergies, past medical and surgical history, social and family history.   PAST MEDICAL HISTORY   Past Medical History:  Diagnosis Date   ALLERGIC RHINITIS    Allergy    ANXIETY    no medations  needed   Arthritis    Asthma    Bronchitis    Cataract    no surgery   COLONIC POLYPS, HX OF    Complication of anesthesia    oxygen sat dropped during endoscopy    COPD    pt unsure of this dx   Cough    DEPRESSION    Elevated coronary artery calcium score 12/2021   Agatston score 173-all LAD   Fatty liver    GERD    GLUCOSE INTOLERANCE    HYPERTENSION    IBS    Impaired glucose tolerance    Obstructive sleep apnea 06/25/2018   OSTEOPENIA    Osteopenia    Osteoporosis 07/30/2017   PERIPHERAL EDEMA    Pneumonia    Pneumonia, organism unspecified(486)    Sleep apnea    Oral Aparatus   TOBACCO USE DISORDER/SMOKER-SMOKING CESSATION DISCUSSED     PAST SURGICAL HISTORY   Past Surgical History:  Procedure Laterality Date   birthmark removed     from back as a child   bladder mesh     2012   CESAREAN SECTION     x2   CHOLECYSTECTOMY      COLONOSCOPY  2018   TA   MOUTH SURGERY  04/25/2020   tissue graft -room of mouth to front gums   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   Procedure: LEFT TOTAL HIP ARTHROPLASTY ANTERIOR APPROACH;  Surgeon: Marcene Corning, MD;  Location: [facility];  Service: Orthopedics;  Laterality: Left;   WISDOM TOOTH EXTRACTION      Immunization History  Administered Date(s) Administered   Hepatitis B, adult 01/23/2017, 02/23/2017   Hepatitis B, ped/adol 07/27/2017   PFIZER Comirnaty(Gray Top)Covid-19 Tri-Sucrose Vaccine 02/10/2021   PFIZER(Purple Top)SARS-COV-2 Vaccination 12/13/2019, 12/27/2019, 08/25/2020   Pneumococcal Conjugate-13 03/05/2016   Pneumococcal Polysaccharide-23 03/11/2017   Td 12/21/2006   Tdap 02/17/2013    MEDICATIONS/ALLERGIES   Current Meds  Medication Sig   albuterol (VENTOLIN HFA) 108 (90 Base) MCG/ACT inhaler Inhale 1-2 puffs into the lungs every 4 (four) hours as needed for wheezing or shortness of breath (coughing).   aspirin 81 MG EC tablet Take 1 tablet (81 mg total) by mouth daily. Swallow whole.   Calcium Carb-Cholecalciferol 600-800 MG-UNIT TABS Take 1 tablet by mouth in the morning and at bedtime.    Cholecalciferol (VITAMIN D PO) Take 2,000 Units by mouth daily.    denosumab (PROLIA) 60 MG/ML SOSY injection Inject 60 mg into the skin every 6 (six) months.   folic acid (FOLVITE) 400 MCG tablet Take 400 mcg by mouth daily.   losartan (COZAAR) 50 MG tablet TAKE 1 TABLET EVERY DAY (ANNUAL APPOINTMENT DUE IN MAY, MUST SEE MD FOR FUTURE REFILLS)   metoprolol succinate (TOPROL-XL) 25 MG 24 hr tablet TAKE 1 TABLET BY MOUTH EVERY DAY   mirabegron ER (MYRBETRIQ) 25 MG TB24 tablet Take 25 mg by mouth daily.   vitamin B-12 (CYANOCOBALAMIN) 1000 MCG tablet Take 1 tablet (1,000 mcg total) by mouth daily.   [DISCONTINUED] atorvastatin (LIPITOR) 20 MG tablet Take 1 tablet (20 mg total) by mouth daily.    Allergies  Allergen Reactions   Ace Inhibitors Cough   Penicillins  Rash    SOCIAL HISTORY/FAMILY HISTORY   Reviewed in Epic:  Pertinent findings:  Social History   Tobacco Use   Smoking status: Former    Packs/day: 1.00    Years: 42.00    Pack years: 42.00    Types: Cigarettes  Start date: 10/20/1977    Quit date: 08/01/2021    Years since quitting: 0.4   Smokeless tobacco: Never  Vaping Use   Vaping Use: Never used  Substance Use Topics   Alcohol use: Yes    Alcohol/week: 21.0 standard drinks    Types: 7 Glasses of wine, 14 Standard drinks or equivalent per week    Comment: couple of drinks 1-2 nites per week    Drug use: No   Social History   Social History Narrative   She indicates that she has been most of her life sedentary, but started exercising in the fall of October 2022: Has started doing water aerobics.      She had work up to 4 days a week, but has had to scale down to about 2 days a week because of knee pain.    OBJCTIVE -PE, EKG, labs   Wt Readings from Last 3 Encounters:  01/03/22 203 lb (92.1 kg)  01/01/22 202 lb (91.6 kg)  11/27/21 200 lb (90.7 kg)    Physical Exam: BP 136/80   Pulse 66   Ht 5\' 3"  (1.6 m)   Wt 203 lb (92.1 kg)   SpO2 100%   BMI 35.96 kg/m  Physical Exam Vitals reviewed.  Constitutional:      General: She is not in acute distress.    Appearance: Normal appearance. She is obese. She is not ill-appearing or toxic-appearing.     Comments: Mesomorphic obesity.  Well-groomed  HENT:     Head: Normocephalic and atraumatic.  Neck:     Vascular: No carotid bruit.  Cardiovascular:     Rate and Rhythm: Normal rate and regular rhythm.     Pulses: Normal pulses.     Heart sounds: Normal heart sounds. No murmur heard.   No friction rub. No gallop.  Pulmonary:     Effort: Pulmonary effort is normal. No respiratory distress.     Breath sounds: Normal breath sounds. No wheezing, rhonchi or rales.  Chest:     Chest wall: No tenderness.  Abdominal:     General: Abdomen is flat. Bowel sounds are  normal. There is no distension.     Palpations: Abdomen is soft. There is no mass.     Tenderness: There is no abdominal tenderness.     Hernia: No hernia is present.     Comments: Obese.  No HSM or bruit  Musculoskeletal:        General: Swelling (Trivial ankle) present. Normal range of motion.     Cervical back: Normal range of motion and neck supple.  Skin:    General: Skin is warm and dry.     Coloration: Skin is not jaundiced or pale.  Neurological:     General: No focal deficit present.     Mental Status: She is alert and oriented to person, place, and time.     Cranial Nerves: No cranial nerve deficit.     Gait: Gait normal.  Psychiatric:        Mood and Affect: Mood normal.        Behavior: Behavior normal.        Thought Content: Thought content normal.        Judgment: Judgment normal.     Adult ECG Report  Rate: 66;  Rhythm: normal sinus rhythm and normal axis, intervals and durations. ;   Narrative Interpretation: Normal EKG.  Recent Labs: Reviewed Lab Results  Component Value Date   CHOL 195 03/11/2021  HDL 82.70 03/11/2021   LDLCALC 100 (H) 03/11/2021   LDLDIRECT 82.2 07/01/2013   TRIG 61.0 03/11/2021   CHOLHDL 2 03/11/2021  02/2020: TC 191,TG 50, HDL 85, LDL 96. 02/2018: TC 195, TG 45, HDL 98, LDL 88  Lab Results  Component Value Date   CREATININE 0.70 11/08/2021   BUN 17 11/08/2021   NA 137 11/08/2021   K 4.9 11/08/2021   CL 103 11/08/2021   CO2 29 11/08/2021   CBC Latest Ref Rng & Units 11/08/2021 03/11/2021 08/31/2020  WBC 4.0 - 10.5 K/uL 5.5 6.5 5.9  Hemoglobin 12.0 - 15.0 g/dL 74.2 59.5 63.8  Hematocrit 36.0 - 46.0 % 40.8 40.9 42.8  Platelets 150.0 - 400.0 K/uL 245.0 273.0 273    Lab Results  Component Value Date   HGBA1C 5.5 03/11/2021   Lab Results  Component Value Date   TSH 1.10 03/11/2021    ==================================================  COVID-19 Education: The signs and symptoms of COVID-19 were discussed with the patient  and how to seek care for testing (follow up with PCP or arrange E-visit).    I spent a total of 30 minutes with the patient spent in direct patient consultation.  Additional time spent with chart review  / charting (studies, outside notes, etc): 16 min Total Time: 46 min  Current medicines are reviewed at length with the patient today.  (+/- concerns) none  This visit occurred during the SARS-CoV-2 public health emergency.  Safety protocols were in place, including screening questions prior to the visit, additional usage of staff PPE, and extensive cleaning of exam room while observing appropriate contact time as indicated for disinfecting solutions.  Notice: This dictation was prepared with Dragon dictation along with smart phrase technology. Any transcriptional errors that result from this process are unintentional and may not be corrected upon review.  Studies Ordered:   Orders Placed This Encounter  Procedures   EKG 12-Lead    Patient Instructions / Medication Changes & Studies & Tests Ordered   Patient Instructions  Medication Instructions:  INCREASE atorvastatin (Lipitor) to 40 mg daily  *If you need a refill on your cardiac medications before your next appointment, please call your pharmacy*  Follow-Up: At [facility], you and your health needs are our priority.  As part of our continuing mission to provide you with exceptional heart care, we have created designated Provider Care Teams.  These Care Teams include your primary Cardiologist (physician) and Advanced Practice Providers (APPs -  Physician Assistants and Nurse Practitioners) who all work together to provide you with the care you need, when you need it.  We recommend signing up for the patient portal called "MyChart".  Sign up information is provided on this After Visit Summary.  MyChart is used to connect with patients for Virtual Visits (Telemedicine).  Patients are able to view lab/test results, encounter notes,  upcoming appointments, etc.  Non-urgent messages can be sent to your provider as well.   To learn more about what you can do with MyChart, go to [email or web address].    Your next appointment:   October  The format for your next appointment:   In Person  Provider:   Dr. Lawernce Pitts, M.D., M.S. Interventional Cardiologist   Pager # [phone] Phone # [phone] [address] [place], [place] [address]   Thank you for choosing [facility] at [facility]!!

## 2022-01-03 NOTE — Patient Instructions (Addendum)
Medication Instructions:  ?INCREASE atorvastatin (Lipitor) to 40 mg daily ? ?*If you need a refill on your cardiac medications before your next appointment, please call your pharmacy* ? ?Follow-Up: ?At [facility], you and your health needs are our priority.  As part of our continuing mission to provide you with exceptional heart care, we have created designated Provider Care Teams.  These Care Teams include your primary Cardiologist (physician) and Advanced Practice Providers (APPs -  Physician Assistants and Nurse Practitioners) who all work together to provide you with the care you need, when you need it. ? ?We recommend signing up for the patient portal called "MyChart".  Sign up information is provided on this After Visit Summary.  MyChart is used to connect with patients for Virtual Visits (Telemedicine).  Patients are able to view lab/test results, encounter notes, upcoming appointments, etc.  Non-urgent messages can be sent to your provider as well.   ?To learn more about what you can do with MyChart, go to [email or web address].   ? ?Your next appointment:   ?October ? ?The format for your next appointment:   ?In Person ? ?Provider:   ?Dr. Ellyn Hack  ? ? ? ?

## 2022-01-05 ENCOUNTER — Encounter: Payer: Self-pay | Admitting: Cardiology

## 2022-01-05 NOTE — Assessment & Plan Note (Addendum)
Mild to moderate risk.   ? ?I spent close to 20 minutes explaining the pathophysiology of atherosclerosis and what coronary calcification means.  Multiple questions were asked and answered. ? ?We talked about the importance of risk factor modification.   ?? Step 1: smoking cessation,  ?? encouraged continued abstinence ?? Step 2:-> increasing exercise and adjusting diet =>  ?? she is making an effort to exercise now and has also changed her diet. ?? Step 3: Blood pressure control: Borderline pressure today at 136/80 on stable dose of ARB and beta-blocker. ->   ?? Low threshold to increase ARB to 100 mg if pressures stay at this level. ?? Step 4: Lipid control-LDL goal now preferably less than 70 (she has been stable and an LDL of roughly 100,. ?? Recently started on statin by PCP.- ?? I well increase the dose to atorvastatin 40 mg daily ?? Step 5: Glycemic control: Most recent A1c 5.5 ?? Hopefully with dietary changes and increased exercise, this will stay controlled. ? ?She is taking aspirin which is not unreasonable, but arguably not necessary at this point.  Okay to hold for procedures or surgeries. ?

## 2022-01-05 NOTE — Assessment & Plan Note (Signed)
Should be due for labs recheck soon.  Most recent LDL was 100. ?I increased her statin to 40 mg.  She should have labs checked in the next couple months by PCP. ?

## 2022-01-05 NOTE — Assessment & Plan Note (Signed)
Stressed the importance of CPAP compliance ?

## 2022-01-05 NOTE — Assessment & Plan Note (Signed)
Similar risk factor modification with increased exercise, cholesterol diet, initiation of statin, close lites and control blood pressure control. ?

## 2022-01-05 NOTE — Assessment & Plan Note (Signed)
Blood pressures have been pretty well controlled, but a little higher here today.  Monitor closely.  Low threshold to increase ARB up to 100 mg daily.  Continue current dose of Toprol. ?

## 2022-01-09 NOTE — Telephone Encounter (Signed)
Pt ready for scheduling on or after 01/28/22 ? ?Out-of-pocket cost due at time of visit: $301 ? ?Primary: [employer] ?Prolia co-insurance: 20% (approximately $276) ?Admin fee co-insurance: 20% (approximately $25) ? ?Secondary: n/a ?Prolia co-insurance:  ?Admin fee co-insurance:  ? ?Deductible: does not apply ? ?Prior Auth: APPROVED ?PA# [id number] ?Valid: 10/20/21-10/19/22 ? ?** This summary of benefits is an estimation of the patient's out-of-pocket cost. Exact cost may vary based on individual plan coverage.  ? ? ? ? ? ? ? ?

## 2022-01-17 ENCOUNTER — Other Ambulatory Visit: Payer: Self-pay | Admitting: Internal Medicine

## 2022-01-17 NOTE — Telephone Encounter (Signed)
Please refill as per office routine med refill policy (all routine meds to be refilled for 3 mo or monthly (per pt preference) up to one year from last visit, then month to month grace period for 3 mo, then further med refills will have to be denied) ? ?

## 2022-01-27 ENCOUNTER — Ambulatory Visit (INDEPENDENT_AMBULATORY_CARE_PROVIDER_SITE_OTHER): Payer: Medicare HMO

## 2022-01-27 DIAGNOSIS — M810 Age-related osteoporosis without current pathological fracture: Secondary | ICD-10-CM | POA: Diagnosis not present

## 2022-01-27 MED ORDER — DENOSUMAB 60 MG/ML ~~LOC~~ SOSY
60.0000 mg | PREFILLED_SYRINGE | Freq: Once | SUBCUTANEOUS | Status: AC
  Administered 2022-01-27: 60 mg via SUBCUTANEOUS

## 2022-01-27 NOTE — Progress Notes (Signed)
Pt came in for Prolia injection. Injection was given w/o complications. ?

## 2022-02-05 ENCOUNTER — Ambulatory Visit: Payer: Medicare HMO | Admitting: Family Medicine

## 2022-02-07 IMAGING — CT CT CHEST LUNG CANCER SCREENING LOW DOSE W/O CM
2 of 4 series · 15 of 36 positions shown, 18 images · non-contrast
Comparison: None.

CLINICAL DATA: Current smoker, 82 pack-year history.

EXAM:
CT CHEST WITHOUT CONTRAST LOW-DOSE FOR LUNG CANCER SCREENING
TECHNIQUE: Multidetector CT imaging of the chest was performed following the
standard protocol without IV contrast.

[Series 3: lung thins 1.0 · axial · 0.70mm/px · z∈[-241,-17]mm · 12 of 248 slices shown, 15 images]
[im 12/248  mediastinal]
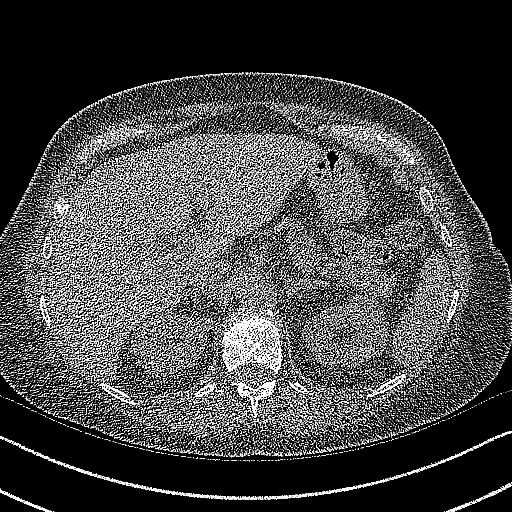
[im 12/248  lung]
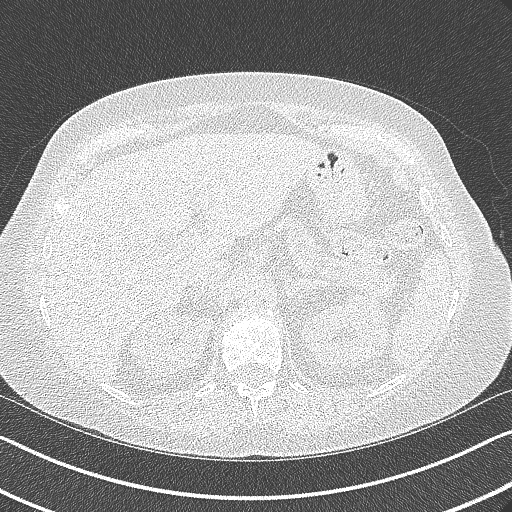
[im 34/248  lung]
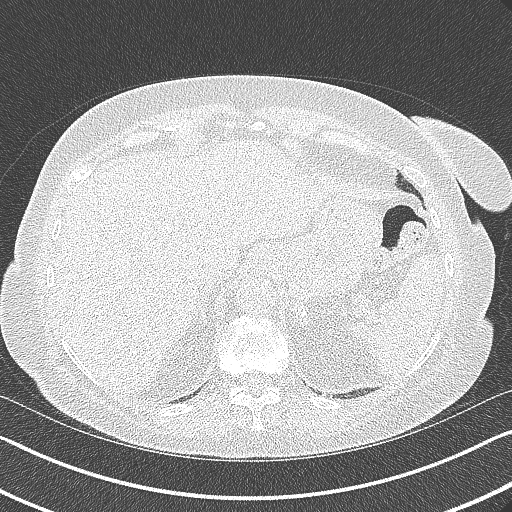
[im 57/248  lung]
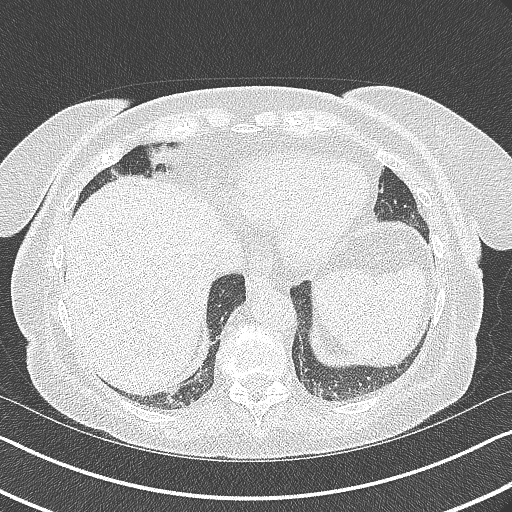
[im 79/248  lung]
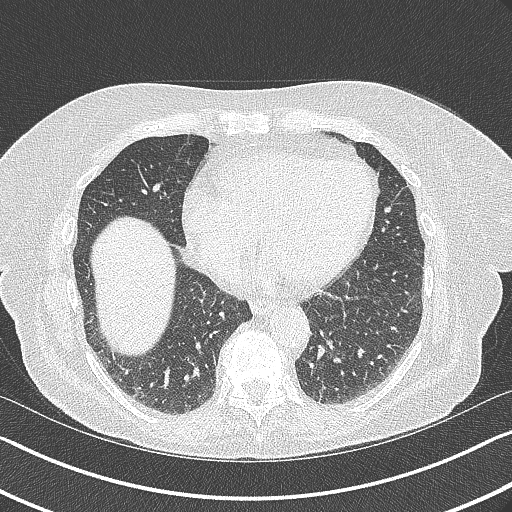
[im 90/248  mediastinal]
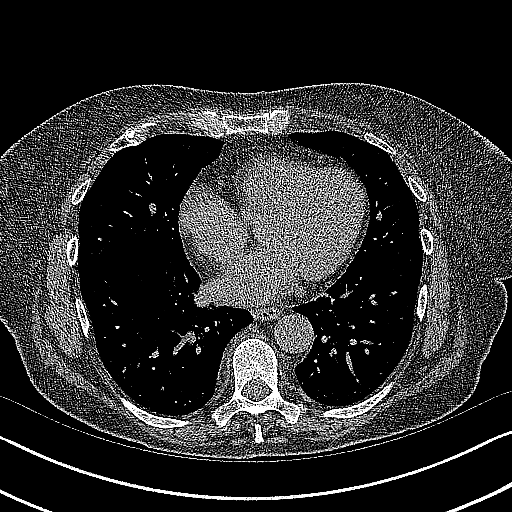
[im 90/248  lung]
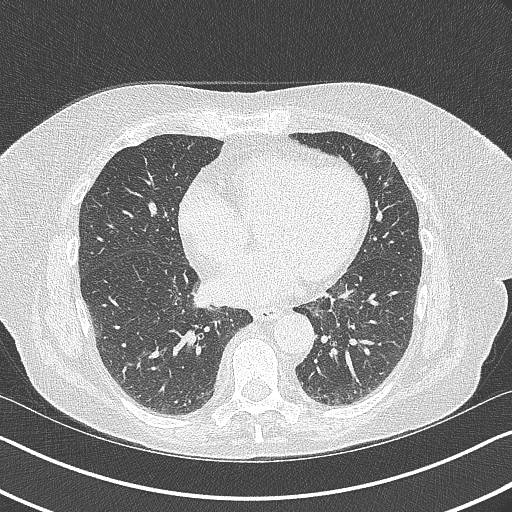
[im 113/248  lung]
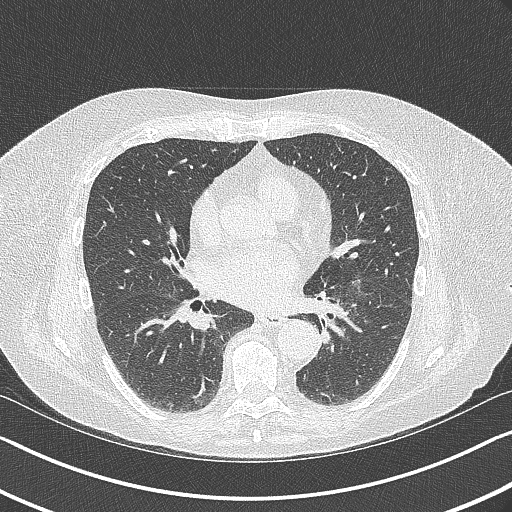
[im 135/248  lung]
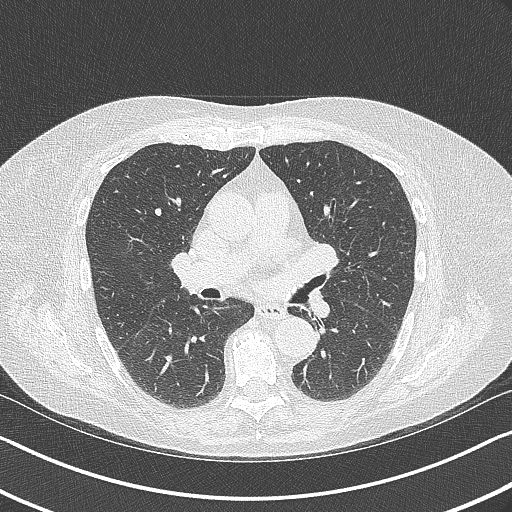
[im 158/248  lung]
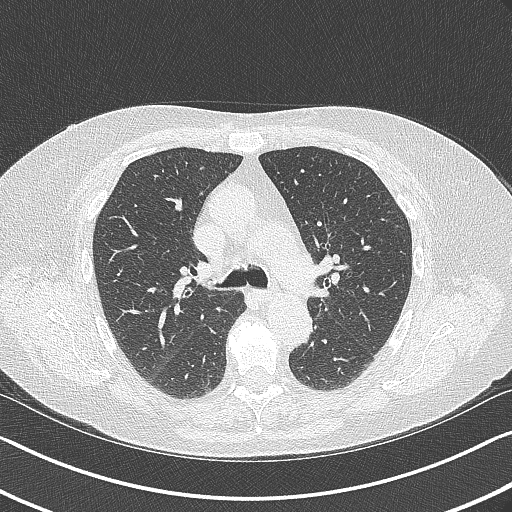
[im 169/248  mediastinal]
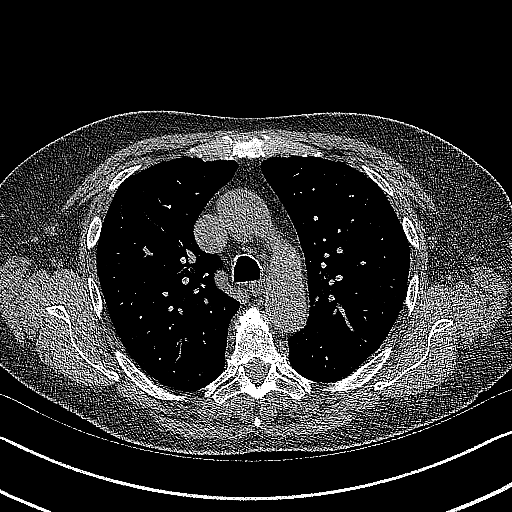
[im 169/248  lung]
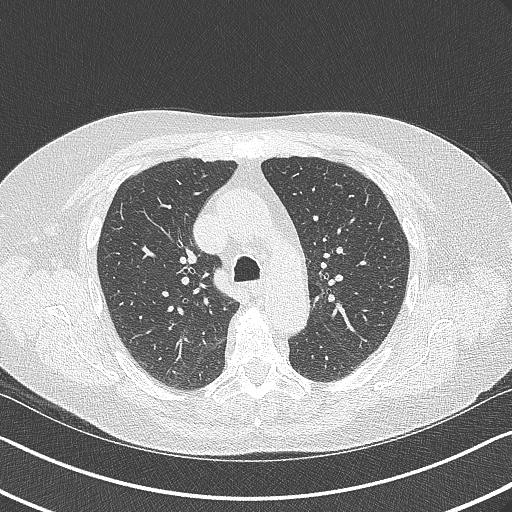
[im 191/248  lung]
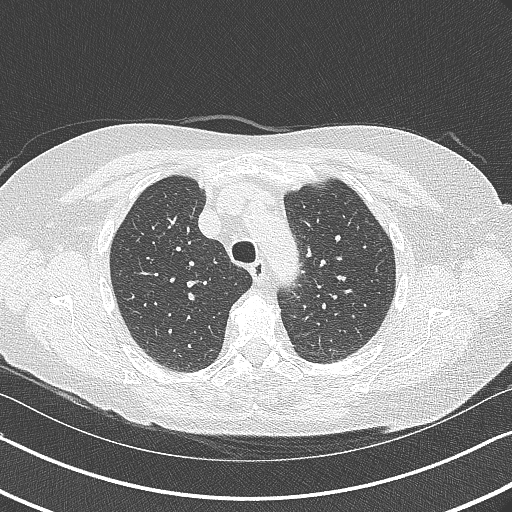
[im 214/248  lung]
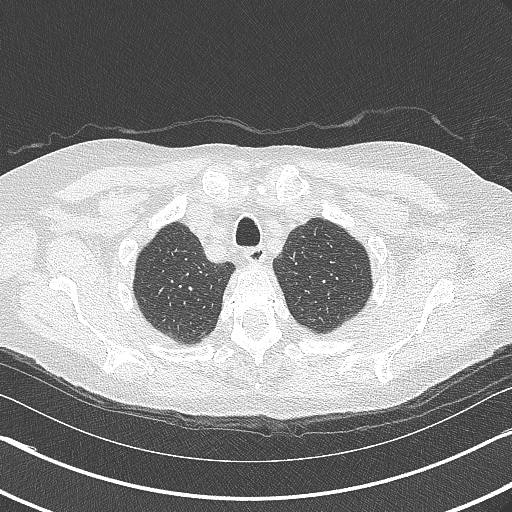
[im 236/248  lung]
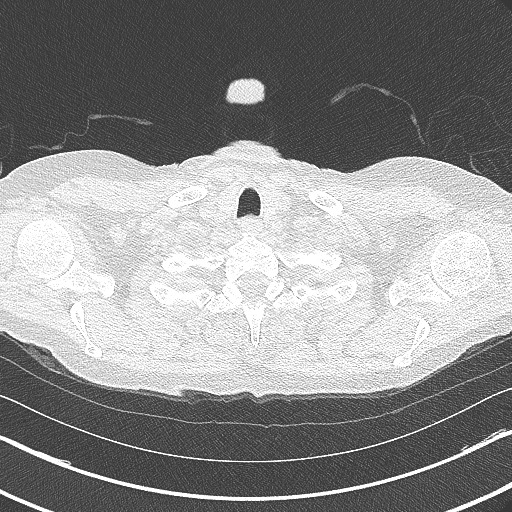

[Series 5: coronal · coronal · 0.52mm/px · 3 of 129 slices shown]
[im 26/129  lung]
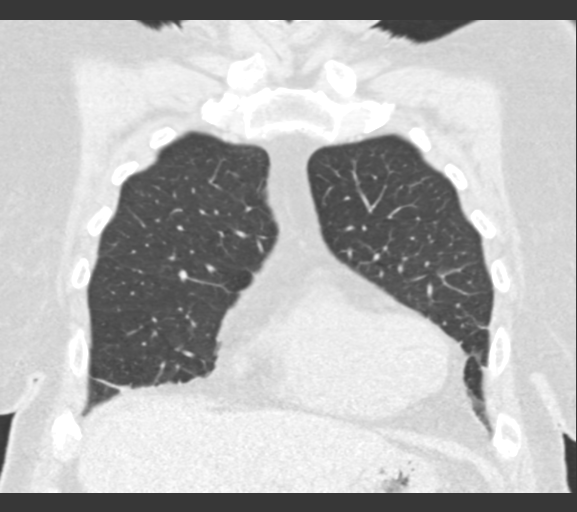
[im 52/129  lung]
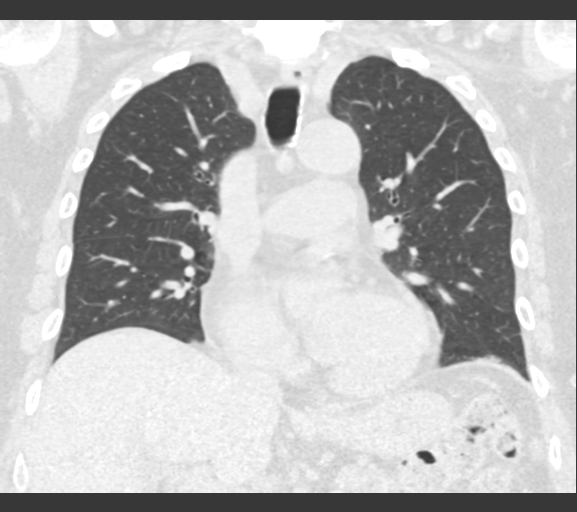
[im 77/129  lung]
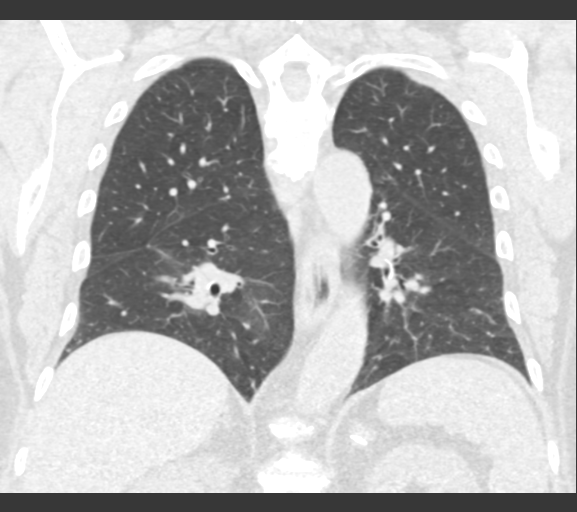

[15 of 36 positions shown; findings below may reference images not displayed]

FINDINGS: Cardiovascular: Atherosclerotic calcification of the aorta, aortic
valve and coronary arteries. Heart is enlarged. No pericardial
effusion.

Mediastinum/Nodes: No pathologically enlarged mediastinal or
axillary lymph nodes. Hilar regions are difficult to definitively
evaluate without IV contrast but appear grossly unremarkable.
Esophagus is grossly unremarkable.

Lungs/Pleura: Centrilobular emphysema. Smoking related respiratory
bronchiolitis. Scattered pulmonary parenchymal scarring. No
suspicious pulmonary nodules. No pleural fluid.

Upper Abdomen: Visualized portions of the liver and right adrenal
gland are unremarkable. Left adrenal calcification. Visualized
portions of the kidneys, spleen, pancreas, stomach and bowel are
otherwise unremarkable.

Musculoskeletal: Degenerative changes in the spine. No worrisome
lytic or sclerotic lesions.
IMPRESSION: 1. Lung-RADS 1, negative. Continue annual screening with low-dose
chest CT without contrast in 12 months.
2. Aortic atherosclerosis ([id number]). Coronary artery
calcification.
3.  Emphysema ([id number]).

## 2022-02-12 NOTE — Telephone Encounter (Signed)
Last Prolia inj 01/27/22 ?Next Prolia inj due 07/30/22 ?

## 2022-02-13 NOTE — Progress Notes (Signed)
?Charlann Boxer D.O. ?[facility] ?[place] ?Phone: [phone] ?Subjective:   ?I, Jacqualin Combes, am serving as a scribe for Dr. Hulan Saas. ? ?This visit occurred during the SARS-CoV-2 public health emergency.  Safety protocols were in place, including screening questions prior to the visit, additional usage of staff PPE, and extensive cleaning of exam room while observing appropriate contact time as indicated for disinfecting solutions.  ? ? ?I'm seeing this patient by the request  of:  Biagio Borg, MD ? ?CC: Bilateral knee pain left greater than right ? ?YTR:ZNBVAPOLID  ?01/01/2022 ?Worsening pain again at this time.  Concerned that patient's arthritis is seems to be worsening.  Patient may need to consider the possibility of surgical intervention with replacement if this continues.  Patient did have significant amount of aspiration done today and hopefully the viscosupplementation will be helpful.  Patient will follow-up with me again in 6 weeks. ? ?Update 02/24/2022 ?SRI CLEGG is a 71 y.o. female coming in with complaint of B knee pain, L>R. Patient states has been doing well. During water fitness this morning side to side movement was tender. No other complaints.  Has been able to be relatively active, not stopping her from any activities with her family, some tightness still noted in the morning of her knee as well as her bilateral ankles. ? ?  ? ?Past Medical History:  ?Diagnosis Date  ? ALLERGIC RHINITIS   ? Allergy   ? ANXIETY   ? no medations needed  ? Arthritis   ? Asthma   ? Bronchitis   ? Cataract   ? no surgery  ? COLONIC POLYPS, HX OF   ? Complication of anesthesia   ? oxygen sat dropped during endoscopy   ? COPD   ? pt unsure of this dx  ? Cough   ? DEPRESSION   ? Elevated coronary artery calcium score 12/2021  ? Agatston score 173-all LAD  ? Fatty liver   ? GERD   ? GLUCOSE INTOLERANCE   ? HYPERTENSION   ? IBS   ? Impaired glucose tolerance   ?  Obstructive sleep apnea 06/25/2018  ? OSTEOPENIA   ? Osteopenia   ? Osteoporosis 07/30/2017  ? PERIPHERAL EDEMA   ? Pneumonia   ? Pneumonia, organism unspecified(486)   ? Sleep apnea   ? Oral Aparatus  ? TOBACCO USE DISORDER/SMOKER-SMOKING CESSATION DISCUSSED   ? ?Past Surgical History:  ?Procedure Laterality Date  ? birthmark removed    ? from back as a child  ? bladder mesh    ? 2012  ? CESAREAN SECTION    ? x2  ? CHOLECYSTECTOMY    ? COLONOSCOPY  2018  ? TA  ? MOUTH SURGERY  04/25/2020  ? tissue graft -room of mouth to front gums  ? 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  ? Procedure: LEFT TOTAL HIP ARTHROPLASTY ANTERIOR APPROACH;  Surgeon: Melrose Nakayama, MD;  Location: [facility];  Service: Orthopedics;  Laterality: Left;  ? WISDOM TOOTH EXTRACTION    ? ?Social History  ? ?Socioeconomic History  ? Marital status: Widowed  ?  Spouse name: Not on file  ? Number of children: 2  ? Years of education: Not on file  ? Highest education level: Not on file  ?Occupational History  ? Occupation: Occupational psychologist industries, Museum/gallery curator for [employer]  ?  Comment: Retired  ?Tobacco Use  ? Smoking status: Former  ?  Packs/day: 1.00  ?  Years: 42.00  ?  Pack years: 42.00  ?  Types: Cigarettes  ?  Start date: 10/20/1977  ?  Quit date: 08/01/2021  ?  Years since quitting: 0.5  ? Smokeless tobacco: Never  ?Vaping Use  ? Vaping Use: Never used  ?Substance and Sexual Activity  ? Alcohol use: Yes  ?  Alcohol/week: 21.0 standard drinks  ?  Types: 7 Glasses of wine, 14 Standard drinks or equivalent per week  ?  Comment: couple of drinks 1-2 nites per week   ? Drug use: No  ? Sexual activity: Not on file  ?Other Topics Concern  ? Not on file  ?Social History Narrative  ? She indicates that she has been most of her life sedentary, but started exercising in the fall of October 2022: Has started doing water aerobics.  ?   ? She had work up to 4 days a week, but has had to scale down to about 2 days a week because of knee pain.   ? ?Social Determinants of Health  ? ?Financial Resource Strain: Not on file  ?Food Insecurity: Not on file  ?Transportation Needs: Not on file  ?Physical Activity: Not on file  ?Stress: Not on file  ?Social Connections: Not on file  ? ?Allergies  ?Allergen Reactions  ? Ace Inhibitors Cough  ? Penicillins Rash  ? ?Family History  ?Problem Relation Age of Onset  ? Breast cancer Sister 7  ?     +lump and tamoxifen; eventually had bilateral mastectomies  ? Allergic rhinitis Sister   ? Colon cancer Father   ?     dx. >79  ? Bladder Cancer Father   ?     dx. 9-79; not a smoker  ? Skin cancer Father   ?     basal cell carcinoma  ? Colon polyps Father   ? Colon cancer Paternal Grandfather   ?     unspecified age  ? Melanoma Paternal Grandfather   ?     unspecified age  ? Skin cancer Mother   ?     NOS; dx. 70s  ? Other Mother   ?     hx of hysterectomy in her early 75s for fibroid cysts  ? Heart attack Maternal Uncle 67  ? Heart attack Maternal Grandmother 62  ? Thyroid cancer Paternal Grandmother 41  ? Breast cancer Sister   ?     dx. early 66s; s/p BL mastectomies  ? Cervical cancer Sister   ?     dx. late 46s  ? Skin cancer Brother   ?     NOS  ? Anxiety disorder Other   ? Hypothyroidism Other   ? Hyperlipidemia Other   ? Diabetes Other   ? Esophageal cancer Neg Hx   ? Stomach cancer Neg Hx   ? Rectal cancer Neg Hx   ? ? ?Current Outpatient Medications (Endocrine & Metabolic):  ?  denosumab (PROLIA) 60 MG/ML SOSY injection, Inject 60 mg into the skin every 6 (six) months. ? ?Current Outpatient Medications (Cardiovascular):  ?  atorvastatin (LIPITOR) 40 MG tablet, Take 1 tablet (40 mg total) by mouth daily. ?  losartan (COZAAR) 50 MG tablet, TAKE 1 TABLET EVERY DAY (ANNUAL APPOINTMENT DUE IN MAY, MUST SEE MD FOR FUTURE REFILLS) ?  metoprolol succinate (TOPROL-XL) 25 MG 24 hr tablet, TAKE 1 TABLET BY MOUTH EVERY DAY ? ?Current Outpatient Medications (Respiratory):  ?  albuterol (VENTOLIN HFA) 108 (90 Base) MCG/ACT  inhaler, Inhale  1-2 puffs into the lungs every 4 (four) hours as needed for wheezing or shortness of breath (coughing). ? ?Current Outpatient Medications (Analgesics):  ?  aspirin 81 MG EC tablet, Take 1 tablet (81 mg total) by mouth daily. Swallow whole. ? ?Current Outpatient Medications (Hematological):  ?  folic acid (FOLVITE) 021 MCG tablet, Take 400 mcg by mouth daily. ?  vitamin B-12 (CYANOCOBALAMIN) 1000 MCG tablet, Take 1 tablet (1,000 mcg total) by mouth daily. ? ?Current Outpatient Medications (Other):  ?  Calcium Carb-Cholecalciferol 600-800 MG-UNIT TABS, Take 1 tablet by mouth in the morning and at bedtime.  ?  Cholecalciferol (VITAMIN D PO), Take 2,000 Units by mouth daily.  ?  mirabegron ER (MYRBETRIQ) 25 MG TB24 tablet, Take 25 mg by mouth daily. ? ? ? ?Review of Systems: ? No headache, visual changes, nausea, vomiting, diarrhea, constipation, dizziness, abdominal pain, skin rash, fevers, chills, night sweats, weight loss, swollen lymph nodes, body aches, joint swelling, chest pain, shortness of breath, mood changes. POSITIVE muscle aches ? ?Objective  ?Blood pressure 134/88, pulse 86, height 5' 3"  (1.6 m), weight 203 lb (92.1 kg), SpO2 96 %. ?  ?General: No apparent distress alert and oriented x3 mood and affect normal, dressed appropriately.  ?HEENT: Pupils equal, extraocular movements intact  ?Respiratory: Patient's speak in full sentences and does not appear short of breath  ?Cardiovascular: No lower extremity edema, non tender, no erythema  ?MSK: Patient does have a very mild antalgic gait.  Patient does have trace effusion noted of the knee and a mild popliteal fullness that is consistent with a Baker's cyst.  Patient does have good range of motion.  Still has instability with valgus and varus force ? ?  ?Impression and Recommendations:  ?  ? ?The above documentation has been reviewed and is accurate and complete Lyndal Pulley, DO ? ? ? ?

## 2022-02-24 ENCOUNTER — Ambulatory Visit: Payer: Medicare HMO | Admitting: Family Medicine

## 2022-02-24 DIAGNOSIS — M170 Bilateral primary osteoarthritis of knee: Secondary | ICD-10-CM

## 2022-02-24 NOTE — Patient Instructions (Signed)
Heel lifts ?Worsening pain you know where we are ?See you again in 2-3 months ?

## 2022-02-24 NOTE — Assessment & Plan Note (Signed)
Patient is doing well overall.  Discussed icing regimen and home exercise, discussed which activities to do and which ones to avoid, increase activity slowly otherwise.  Follow-up with me again in 6-8 worsening pain consider repeat the steroid injections into the viscosupplementation ?

## 2022-02-27 ENCOUNTER — Telehealth: Payer: Self-pay | Admitting: Student-PharmD

## 2022-02-27 NOTE — Telephone Encounter (Signed)
? ?ROGER FASNACHT is a 71 y.o. female and was referred to the pharmacist telephone-based smoking cessation service by pulmonologist Dr. Annamaria Boots. ? ?[name] has successfully quit smoking as of 08/01/21. She experienced skin irritation with multiple brands of nicotine patches, so she is currently using a combination of nicotine gum and lozenges throughout the day.  ? ?I spoke with [name] today and she to continues to have no thoughts of smoking. She has successfully shifted her mindset to being a "nonsmoker." She does still feel frustrated with not being able to lose the weight she gained in the process of quitting but is grateful that she has quit smoking. She continues to be active with her water aerobics. She feels confident in continuing to be a nonsmoker.  ? ?Current tobacco use: None (quit 08/01/21) ? ?Tobacco Use History ?-Baseline tobacco use as of 06/28/21: 20 cigarettes/day (1 ppd) ?-Time to first cigarette: < 30 minutes ?-Started smoking at 71 years old ? ?Quit Attempt History  ?-Have you tried to quit in the past? Yes. Quit smoking for 11 months previously, but when mother moved in with her from the nursing home she started smoking again from the stress, and then her mother passed away 3 months later. Longest time other than that was 6 or 8 weeks about 20 years ago.  ?-Most recent quit attempt: February 2017 - January 2018  ?-Longest time ever been tobacco free: 11 months ?-What helped? In 2017 she used [facility] cessation program. Her target quit date at that time was the anniversary of husband's death (died at 64 of massive heart attack in 2008). She used to smoke 2 ppd when her husband was alive (currently 1 ppd). At that time, she used nicotine gum and patches, which worked really well for her. She also would keep a paper on her wall in her office that she would write the number of days it had been since she had quit which she found very motivating.  ?-What was difficult? Stress of 24/7 care  of mother followed by her mother's passing  ? ?Tobacco Use Habits: ?-Triggers include getting together with a group that smokes. Working at home (works 1-2 hours, takes break to go smoke). More of a habit. Excitement - celebrating accomplishment/getting work done. Smoking when driving - doesn't bring cigarettes in her car anymore so she has stopped this. She gets edgy if she haven't smoked in a while. At least 1.5-2 hours when she starts craving. In the morning she smokes 2 or 3 cigarettes in the first hour and a half of waking up. Then it's usually 1 cigarette about every hour through the day.  ?-Does not wake at night to smoke. Has cravings but doesn't get up to do it. Reports she hasn't done that in years.  ?-Alcohol use: Drinks 1 glass of wine/night, fifth of vodka in a week. When drinking, she smokes more.  ?-Other smokers in household or daily life: None (lives alone). Daughter smokes but lives in [place]. Son vapes.  ?Identify social support: Siblings are her biggest supporters (2 brothers, 2 sisters). Older brother has quit for >30 years now and encourages her to smoke. He is her only sibling that is local to [place]. Best friend - she and her husband quit smoking 15 years ago - could reach out to her or her husband for support.  ? ?On a scale of 1-10, how CONFIDENT are you that you will successfully quit: 9 (8-10)  ?-Barriers/concerns: Boredom - finding something to  replace time with.  ? ?On a scale of 1-10, how IMPORTANT is it to you that you quit: 10 ?-Motivators: She is getting braces soon to correct her teeth/bite that have shifted since using a sleep apnea mouth piece, financial (spend $250/month on cigarettes), she would love to surprise her brother that she has quit smoking, health and longevity (husband died from a heart attack and was a heavy smoker, grandsons are 52 and 5 and she wants to be around to watch them graduate high school and college)  ? ?Past pharmacotherapy trials:  ?[x]  Nicotine gum: worked  well, quit x 11 months ?[]  Nicotine lozenge ?[x]  Nicotine patch: worked well, quit x 11 months; during current quit attempt, experiences skin reactions with multiple brands and dose strengths ?[]  Nicotine inhaler ?[]  Nicotine nasal spray ?[]  Bupropion (Zyban) ?[x]  Varenicline (Chantix): Tried Chantix once at a difficult time in life (work, husband, finances), found that it did not work well, had more "blue spells."  ? ?Current Outpatient Medications  ?Medication Instructions  ? albuterol (VENTOLIN HFA) 108 (90 Base) MCG/ACT inhaler 1-2 puffs, Inhalation, Every 4 hours PRN  ? aspirin 81 mg, Oral, Daily, Swallow whole.  ? atorvastatin (LIPITOR) 40 mg, Oral, Daily  ? Calcium Carb-Cholecalciferol 600-800 MG-UNIT TABS 1 tablet, Oral, 2 times daily  ? Cholecalciferol (VITAMIN D PO) 2,000 Units, Oral, Daily  ? denosumab (PROLIA) 60 mg, Subcutaneous, Every 6 months  ? folic acid (FOLVITE) 078 mcg, Oral, Daily  ? losartan (COZAAR) 50 MG tablet TAKE 1 TABLET EVERY DAY (ANNUAL APPOINTMENT DUE IN MAY, MUST SEE MD FOR FUTURE REFILLS)  ? metoprolol succinate (TOPROL-XL) 25 MG 24 hr tablet TAKE 1 TABLET BY MOUTH EVERY DAY  ? mirabegron ER (MYRBETRIQ) 25 mg, Oral, Daily  ? vitamin B-12 (CYANOCOBALAMIN) 1,000 mcg, Oral, Daily  ? ? ?Assessment/Plan: ?[name] has successfully quit smoking since 08/01/21. I congratulated her on her success with continuing to not smoke. We discussed working slowly toward weaning off of nicotine replacement but not hesitating to use it when she needs it. Given patient's success with quitting for >6 months and her confidence in ability to maintain being a nonsmoker, will not plan follow up with patient. Encouraged her to reach out to the pulmonology office if she feels she needs further touch bases with a pharmacist. She is agreeable to this plan and feels she no longer needs scheduled follow up.  ? ?Time spent: 10 mins ? ?Rebbeca Paul, PharmD ?PGY2 Ambulatory Care Pharmacy Resident ?02/27/2022 3:56 PM ?

## 2022-04-07 ENCOUNTER — Ambulatory Visit (INDEPENDENT_AMBULATORY_CARE_PROVIDER_SITE_OTHER): Payer: Medicare HMO

## 2022-04-07 DIAGNOSIS — Z0000 Encounter for general adult medical examination without abnormal findings: Secondary | ICD-10-CM | POA: Diagnosis not present

## 2022-04-07 NOTE — Patient Instructions (Signed)
Crystal [name] , Thank you for taking time to come for your Medicare Wellness Visit. I appreciate your ongoing commitment to your health goals. Please review the following plan we discussed and let me know if I can assist you in the future.   Screening recommendations/referrals: Colonoscopy: 06/07/2020 Mammogram: 08/30/2021 Bone Density: 06/10/2020 Recommended yearly ophthalmology/optometry visit for glaucoma screening and checkup Recommended yearly dental visit for hygiene and checkup  Vaccinations: Influenza vaccine: declined  Pneumococcal vaccine: completed  Tdap vaccine: 02/17/2013 Shingles vaccine: will consider     Advanced directives: yes   Conditions/risks identified: none   Next appointment: none    Preventive Care 10 Years and Older, Female Preventive care refers to lifestyle choices and visits with your health care provider that can promote health and wellness. What does preventive care include? A yearly physical exam. This is also called an annual well check. Dental exams once or twice a year. Routine eye exams. Ask your health care provider how often you should have your eyes checked. Personal lifestyle choices, including: Daily care of your teeth and gums. Regular physical activity. Eating a healthy diet. Avoiding tobacco and drug use. Limiting alcohol use. Practicing safe sex. Taking low-dose aspirin every day. Taking vitamin and mineral supplements as recommended by your health care provider. What happens during an annual well check? The services and screenings done by your health care provider during your annual well check will depend on your age, overall health, lifestyle risk factors, and family history of disease. Counseling  Your health care provider may ask you questions about your: Alcohol use. Tobacco use. Drug use. Emotional well-being. Home and relationship well-being. Sexual activity. Eating habits. History of falls. Memory and ability to  understand (cognition). Work and work Statistician. Reproductive health. Screening  You may have the following tests or measurements: Height, weight, and BMI. Blood pressure. Lipid and cholesterol levels. These may be checked every 5 years, or more frequently if you are over 29 years old. Skin check. Lung cancer screening. You may have this screening every year starting at age 38 if you have a 30-pack-year history of smoking and currently smoke or have quit within the past 15 years. Fecal occult blood test (FOBT) of the stool. You may have this test every year starting at age 67. Flexible sigmoidoscopy or colonoscopy. You may have a sigmoidoscopy every 5 years or a colonoscopy every 10 years starting at age 27. Hepatitis C blood test. Hepatitis B blood test. Sexually transmitted disease (STD) testing. Diabetes screening. This is done by checking your blood sugar (glucose) after you have not eaten for a while (fasting). You may have this done every 1-3 years. Bone density scan. This is done to screen for osteoporosis. You may have this done starting at age 20. Mammogram. This may be done every 1-2 years. Talk to your health care provider about how often you should have regular mammograms. Talk with your health care provider about your test results, treatment options, and if necessary, the need for more tests. Vaccines  Your health care provider may recommend certain vaccines, such as: Influenza vaccine. This is recommended every year. Tetanus, diphtheria, and acellular pertussis (Tdap, Td) vaccine. You may need a Td booster every 10 years. Zoster vaccine. You may need this after age 58. Pneumococcal 13-valent conjugate (PCV13) vaccine. One dose is recommended after age 96. Pneumococcal polysaccharide (PPSV23) vaccine. One dose is recommended after age 27. Talk to your health care provider about which screenings and vaccines you need and how often  you need them. This information is not  intended to replace advice given to you by your health care provider. Make sure you discuss any questions you have with your health care provider. Document Released: 11/02/2015 Document Revised: 06/25/2016 Document Reviewed: 08/07/2015 Elsevier Interactive Patient Education  2017 [place] Prevention in the Home Falls can cause injuries. They can happen to people of all ages. There are many things you can do to make your home safe and to help prevent falls. What can I do on the outside of my home? Regularly fix the edges of walkways and driveways and fix any cracks. Remove anything that might make you trip as you walk through a door, such as a raised step or threshold. Trim any bushes or trees on the path to your home. Use bright outdoor lighting. Clear any walking paths of anything that might make someone trip, such as rocks or tools. Regularly check to see if handrails are loose or broken. Make sure that both sides of any steps have handrails. Any raised decks and porches should have guardrails on the edges. Have any leaves, snow, or ice cleared regularly. Use sand or salt on walking paths during winter. Clean up any spills in your garage right away. This includes oil or grease spills. What can I do in the bathroom? Use night lights. Install grab bars by the toilet and in the tub and shower. Do not use towel bars as grab bars. Use non-skid mats or decals in the tub or shower. If you need to sit down in the shower, use a plastic, non-slip stool. Keep the floor dry. Clean up any water that spills on the floor as soon as it happens. Remove soap buildup in the tub or shower regularly. Attach bath mats securely with double-sided non-slip rug tape. Do not have throw rugs and other things on the floor that can make you trip. What can I do in the bedroom? Use night lights. Make sure that you have a light by your bed that is easy to reach. Do not use any sheets or blankets that are  too big for your bed. They should not hang down onto the floor. Have a firm chair that has side arms. You can use this for support while you get dressed. Do not have throw rugs and other things on the floor that can make you trip. What can I do in the kitchen? Clean up any spills right away. Avoid walking on wet floors. Keep items that you use a lot in easy-to-reach places. If you need to reach something above you, use a strong step stool that has a grab bar. Keep electrical cords out of the way. Do not use floor polish or wax that makes floors slippery. If you must use wax, use non-skid floor wax. Do not have throw rugs and other things on the floor that can make you trip. What can I do with my stairs? Do not leave any items on the stairs. Make sure that there are handrails on both sides of the stairs and use them. Fix handrails that are broken or loose. Make sure that handrails are as long as the stairways. Check any carpeting to make sure that it is firmly attached to the stairs. Fix any carpet that is loose or worn. Avoid having throw rugs at the top or bottom of the stairs. If you do have throw rugs, attach them to the floor with carpet tape. Make sure that you have a light  switch at the top of the stairs and the bottom of the stairs. If you do not have them, ask someone to add them for you. What else can I do to help prevent falls? Wear shoes that: Do not have high heels. Have rubber bottoms. Are comfortable and fit you well. Are closed at the toe. Do not wear sandals. If you use a stepladder: Make sure that it is fully opened. Do not climb a closed stepladder. Make sure that both sides of the stepladder are locked into place. Ask someone to hold it for you, if possible. Clearly mark and make sure that you can see: Any grab bars or handrails. First and last steps. Where the edge of each step is. Use tools that help you move around (mobility aids) if they are needed. These  include: Canes. Walkers. Scooters. Crutches. Turn on the lights when you go into a dark area. Replace any light bulbs as soon as they burn out. Set up your furniture so you have a clear path. Avoid moving your furniture around. If any of your floors are uneven, fix them. If there are any pets around you, be aware of where they are. Review your medicines with your doctor. Some medicines can make you feel dizzy. This can increase your chance of falling. Ask your doctor what other things that you can do to help prevent falls. This information is not intended to replace advice given to you by your health care provider. Make sure you discuss any questions you have with your health care provider. Document Released: 08/02/2009 Document Revised: 03/13/2016 Document Reviewed: 11/10/2014 Elsevier Interactive Patient Education  2017 [employer].

## 2022-04-07 NOTE — Progress Notes (Signed)
Subjective:   Crystal [name] is a 71 y.o. female who presents for Medicare Annual (Subsequent) preventive examination.   I connected with Kalina Morabito today by telephone and verified that I am speaking with the correct person using two identifiers. Location patient: home Location provider: work Persons participating in the virtual visit: patient, provider.   I discussed the limitations, risks, security and privacy concerns of performing an evaluation and management service by telephone and the availability of in person appointments. I also discussed with the patient that there may be a patient responsible charge related to this service. The patient expressed understanding and verbally consented to this telephonic visit.    Interactive audio and video telecommunications were attempted between this provider and patient, however failed, due to patient having technical difficulties OR patient did not have access to video capability.  We continued and completed visit with audio only.    Review of Systems     Cardiac Risk Factors include: advanced age (>86mn, >>16women)     Objective:    Today's Vitals   There is no height or weight on file to calculate BMI.     04/07/2022   11:20 AM 09/11/2020    4:00 PM 09/11/2020    5:41 AM 07/27/2017   10:30 AM 04/16/2017    2:13 PM 01/01/2016    8:46 AM 12/18/2015    8:30 AM  Advanced Directives  Does Patient Have a Medical Advance Directive? Yes No No Yes Yes Yes Yes  Type of [place]of [place]   [place] will [place]  [place] Chart? No - copy requested   No - copy requested  No - copy requested   Would patient like information on creating a medical advance directive?  No - Patient declined No - Patient declined        Current Medications (verified) Outpatient Encounter  Medications as of 04/07/2022  Medication Sig   albuterol (VENTOLIN HFA) 108 (90 Base) MCG/ACT inhaler Inhale 1-2 puffs into the lungs every 4 (four) hours as needed for wheezing or shortness of breath (coughing).   aspirin 81 MG EC tablet Take 1 tablet (81 mg total) by mouth daily. Swallow whole.   atorvastatin (LIPITOR) 40 MG tablet Take 1 tablet (40 mg total) by mouth daily.   Calcium Carb-Cholecalciferol 600-800 MG-UNIT TABS Take 1 tablet by mouth in the morning and at bedtime.    Cholecalciferol (VITAMIN D PO) Take 2,000 Units by mouth daily.    denosumab (PROLIA) 60 MG/ML SOSY injection Inject 60 mg into the skin every 6 (six) months.   folic acid (FOLVITE) 4038MCG tablet Take 400 mcg by mouth daily.   losartan (COZAAR) 50 MG tablet TAKE 1 TABLET EVERY DAY (ANNUAL APPOINTMENT DUE IN MAY, MUST SEE MD FOR FUTURE REFILLS)   metoprolol succinate (TOPROL-XL) 25 MG 24 hr tablet TAKE 1 TABLET BY MOUTH EVERY DAY   mirabegron ER (MYRBETRIQ) 25 MG TB24 tablet Take 25 mg by mouth daily.   vitamin B-12 (CYANOCOBALAMIN) 1000 MCG tablet Take 1 tablet (1,000 mcg total) by mouth daily.   No facility-administered encounter medications on file as of 04/07/2022.    Allergies (verified) Ace inhibitors and Penicillins   History: Past Medical History:  Diagnosis Date   ALLERGIC RHINITIS    Allergy    ANXIETY    no medations needed   Arthritis  Asthma    Bronchitis    Cataract    no surgery   COLONIC POLYPS, HX OF    Complication of anesthesia    oxygen sat dropped during endoscopy    COPD    pt unsure of this dx   Cough    DEPRESSION    Elevated coronary artery calcium score 12/2021   Agatston score 173-all LAD   Fatty liver    GERD    GLUCOSE INTOLERANCE    HYPERTENSION    IBS    Impaired glucose tolerance    Obstructive sleep apnea 06/25/2018   OSTEOPENIA    Osteopenia    Osteoporosis 07/30/2017   PERIPHERAL EDEMA    Pneumonia    Pneumonia, organism unspecified(486)    Sleep  apnea    Oral Aparatus   TOBACCO USE DISORDER/SMOKER-SMOKING CESSATION DISCUSSED    Past Surgical History:  Procedure Laterality Date   birthmark removed     from back as a child   bladder mesh     2012   CESAREAN SECTION     x2   CHOLECYSTECTOMY     COLONOSCOPY  2018   TA   MOUTH SURGERY  04/25/2020   tissue graft -room of mouth to front gums   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   Procedure: LEFT TOTAL HIP ARTHROPLASTY ANTERIOR APPROACH;  Surgeon: Melrose Nakayama, MD;  Location: [facility];  Service: Orthopedics;  Laterality: Left;   WISDOM TOOTH EXTRACTION     Family History  Problem Relation Age of Onset   Breast cancer Sister 69       +lump and tamoxifen; eventually had bilateral mastectomies   Allergic rhinitis Sister    Colon cancer Father        dx. >98   Bladder Cancer Father        dx. 62-79; not a smoker   Skin cancer Father        basal cell carcinoma   Colon polyps Father    Colon cancer Paternal Grandfather        unspecified age   Melanoma Paternal Grandfather        unspecified age   Skin cancer Mother        NOS; dx. 26s   Other Mother        hx of hysterectomy in her early 31s for fibroid cysts   Heart attack Maternal Uncle 67   Heart attack Maternal Grandmother 62   Thyroid cancer Paternal Grandmother 88   Breast cancer Sister        dx. early 81s; s/p BL mastectomies   Cervical cancer Sister        dx. late 22s   Skin cancer Brother        NOS   Anxiety disorder Other    Hypothyroidism Other    Hyperlipidemia Other    Diabetes Other    Esophageal cancer Neg Hx    Stomach cancer Neg Hx    Rectal cancer Neg Hx    Social History   Socioeconomic History   Marital status: Widowed    Spouse name: Not on file   Number of children: 2   Years of education: Not on file   Highest education level: Not on file  Occupational History   Occupation: Occupational psychologist industries, signage for [employer]    Comment: Retired  Tobacco Use    Smoking status: Former    Packs/day: 1.00    Years: 42.00    Total  pack years: 42.00    Types: Cigarettes    Start date: 10/20/1977    Quit date: 08/01/2021    Years since quitting: 0.6   Smokeless tobacco: Never  Vaping Use   Vaping Use: Never used  Substance and Sexual Activity   Alcohol use: Yes    Alcohol/week: 21.0 standard drinks of alcohol    Types: 7 Glasses of wine, 14 Standard drinks or equivalent per week    Comment: couple of drinks 1-2 nites per week    Drug use: No   Sexual activity: Not on file  Other Topics Concern   Not on file  Social History Narrative   She indicates that she has been most of her life sedentary, but started exercising in the fall of October 2022: Has started doing water aerobics.      She had work up to 4 days a week, but has had to scale down to about 2 days a week because of knee pain.   Social Determinants of Health   Financial Resource Strain: Low Risk  (04/07/2022)   Overall Financial Resource Strain (CARDIA)    Difficulty of Paying Living Expenses: Not hard at all  Food Insecurity: No Food Insecurity (04/07/2022)   Hunger Vital Sign    Worried About Running Out of Food in the Last Year: Never true    Ran Out of Food in the Last Year: Never true  Transportation Needs: No Transportation Needs (04/07/2022)   PRAPARE - Hydrologist (Medical): No    Lack of Transportation (Non-Medical): No  Physical Activity: Sufficiently Active (04/07/2022)   Exercise Vital Sign    Days of Exercise per Week: 3 days    Minutes of Exercise per Session: 50 min  Stress: No Stress Concern Present (04/07/2022)   [place]    Feeling of Stress : Not at all  Social Connections: Socially Isolated (04/07/2022)   Social Connection and Isolation Panel [NHANES]    Frequency of Communication with Friends and Family: Three times a week    Frequency of Social Gatherings with  Friends and Family: Three times a week    Attends Religious Services: Never    Active Member of Clubs or Organizations: No    Attends Archivist Meetings: Never    Marital Status: Widowed    Tobacco Counseling Counseling given: Not Answered   Clinical Intake:  Pre-visit preparation completed: Yes  Pain : No/denies pain     Nutritional Risks: None Diabetes: No  How often do you need to have someone help you when you read instructions, pamphlets, or other written materials from your doctor or pharmacy?: 1 - Never What is the last grade level you completed in school?: college  Diabetic?no   Interpreter Needed?: No  Information entered by :: [place] [facility] Daily Living    04/07/2022   11:25 AM  In your present state of health, do you have any difficulty performing the following activities:  Hearing? 0  Vision? 0  Difficulty concentrating or making decisions? 0  Walking or climbing stairs? 0  Dressing or bathing? 0  Doing errands, shopping? 0  Preparing Food and eating ? N  Using the Toilet? N  In the past six months, have you accidently leaked urine? N  Do you have problems with loss of bowel control? N  Managing your Medications? N  Managing your Finances? N  Housekeeping or managing your Housekeeping?  N    Patient Care Team: Biagio Borg, MD as PCP - General (Internal Medicine) Leonie Man, MD as PCP - Cardiology (Cardiology)  Indicate any recent Medical Services you may have received from other than [facility] providers in the past year (date may be approximate).     Assessment:   This is a routine wellness examination for [place].  Hearing/Vision screen Vision Screening - Comments:: Annual eye exams wears glasses   Dietary issues and exercise activities discussed: Current Exercise Habits: Home exercise routine, Type of exercise: walking, Time (Minutes): 50, Frequency (Times/Week): 3, Weekly Exercise (Minutes/Week): 150,  Intensity: Mild, Exercise limited by: None identified   Goals Addressed   None    Depression Screen    04/07/2022   11:21 AM 04/07/2022   11:19 AM 03/13/2021   10:53 AM 03/13/2021   10:33 AM 02/24/2020   10:12 AM 02/24/2020    9:42 AM 03/08/2018    3:06 PM  PHQ 2/9 Scores  PHQ - 2 Score 0 0 1 0 0 0 0    Fall Risk    04/07/2022   11:21 AM 03/13/2021   10:52 AM 03/13/2021   10:33 AM 02/24/2020   10:12 AM 02/24/2020    9:42 AM  Fall Risk   Falls in the past year? 0 0 0 0 0  Number falls in past yr: 0 0 0 0 0  Injury with Fall? 0 0 0 0 0  Risk for fall due to :     No Fall Risks  Follow up Falls evaluation completed    Falls evaluation completed  Comment prn cane        FALL RISK PREVENTION PERTAINING TO THE HOME:  Any stairs in or around the home? No  If so, are there any without handrails? No  Home free of loose throw rugs in walkways, pet beds, electrical cords, etc? Yes  Adequate lighting in your home to reduce risk of falls? Yes   ASSISTIVE DEVICES UTILIZED TO PREVENT FALLS:  Life alert? No  Use of a cane, walker or w/c? Yes  Grab bars in the bathroom? Yes  Shower chair or bench in shower? No  Elevated toilet seat or a handicapped toilet? No     Cognitive Function:  Normal cognitive status assessed by telephone conversation  by this Nurse Health Advisor. No abnormalities found.        Immunizations Immunization History  Administered Date(s) Administered   Hepatitis B, adult 01/23/2017, 02/23/2017   Hepatitis B, ped/adol 07/27/2017   PFIZER Comirnaty(Gray Top)Covid-19 Tri-Sucrose Vaccine 02/10/2021   PFIZER(Purple Top)SARS-COV-2 Vaccination 12/13/2019, 12/27/2019, 08/25/2020   Pneumococcal Conjugate-13 03/05/2016   Pneumococcal Polysaccharide-23 03/11/2017   Td 12/21/2006   Tdap 02/17/2013    TDAP status: Up to date  Flu Vaccine status: Up to date  Pneumococcal vaccine status: Up to date  Covid-19 vaccine status: Completed vaccines  Qualifies for  Shingles Vaccine? Yes   Zostavax completed No   Shingrix Completed?: No.    Education has been provided regarding the importance of this vaccine. Patient has been advised to call insurance company to determine out of pocket expense if they have not yet received this vaccine. Advised may also receive vaccine at local pharmacy or Health Dept. Verbalized acceptance and understanding.  Screening Tests Health Maintenance  Topic Date Due   Zoster Vaccines- Shingrix (1 of 2) Never done   COVID-19 Vaccine (5 - Pfizer series) 04/07/2021   HEMOGLOBIN A1C  09/11/2021   OPHTHALMOLOGY EXAM  01/14/2022   FOOT EXAM  03/13/2022   MAMMOGRAM  08/30/2022   TETANUS/TDAP  02/18/2023   COLONOSCOPY (Pts 45-62yr Insurance coverage will need to be confirmed)  06/08/2023   Pneumonia Vaccine 72 Years old  Completed   DEXA SCAN  Completed   Hepatitis C Screening  Completed   HPV VACCINES  Aged Out   INFLUENZA VACCINE  Discontinued    Health Maintenance  Health Maintenance Due  Topic Date Due   Zoster Vaccines- Shingrix (1 of 2) Never done   COVID-19 Vaccine (5 - Pfizer series) 04/07/2021   HEMOGLOBIN A1C  09/11/2021   OPHTHALMOLOGY EXAM  01/14/2022   FOOT EXAM  03/13/2022    Colorectal cancer screening: Type of screening: Colonoscopy. Completed 06/07/2020. Repeat every 3 years  Mammogram status: Completed 08/30/2021. Repeat every year  Bone Density status: Completed 06/13/2020. Results reflect: Bone density results: OSTEOPENIA. Repeat every 5 years.  Lung Cancer Screening: (Low Dose CT Chest recommended if Age 72-80years, 30 pack-year currently smoking OR have quit w/in 15years.) does not qualify.   Lung Cancer Screening Referral: n/a  Additional Screening:  Hepatitis C Screening: does not qualify;   Vision Screening: Recommended annual ophthalmology exams for early detection of glaucoma and other disorders of the eye. Is the patient up to date with their annual eye exam?  Yes  Who is the  provider or what is the name of the office in which the patient attends annual eye exams? Dr. PJerline Pain If pt is not established with a provider, would they like to be referred to a provider to establish care? No .   Dental Screening: Recommended annual dental exams for proper oral hygiene  Community Resource Referral / Chronic Care Management: CRR required this visit?  No   CCM required this visit?  No      Plan:     I have personally reviewed and noted the following in the patient's chart:   Medical and social history Use of alcohol, tobacco or illicit drugs  Current medications and supplements including opioid prescriptions.  Functional ability and status Nutritional status Physical activity Advanced directives List of other physicians Hospitalizations, surgeries, and ER visits in previous 12 months Vitals Screenings to include cognitive, depression, and falls Referrals and appointments  In addition, I have reviewed and discussed with patient certain preventive protocols, quality metrics, and best practice recommendations. A written personalized care plan for preventive services as well as general preventive health recommendations were provided to patient.     LRandel Pigg LPN   [id number]  Nurse Notes: none

## 2022-04-09 ENCOUNTER — Ambulatory Visit: Payer: Medicare HMO | Admitting: Internal Medicine

## 2022-04-17 ENCOUNTER — Telehealth: Payer: Self-pay | Admitting: Internal Medicine

## 2022-04-17 NOTE — Telephone Encounter (Signed)
Patient notified that labs are ordered

## 2022-04-17 NOTE — Telephone Encounter (Signed)
Pt would like to have her labs ordered a couple of days before her appointment in 04/24/22 if possible.  Please advise.

## 2022-04-18 ENCOUNTER — Other Ambulatory Visit (INDEPENDENT_AMBULATORY_CARE_PROVIDER_SITE_OTHER): Payer: Medicare HMO

## 2022-04-18 DIAGNOSIS — E559 Vitamin D deficiency, unspecified: Secondary | ICD-10-CM

## 2022-04-18 DIAGNOSIS — E538 Deficiency of other specified B group vitamins: Secondary | ICD-10-CM | POA: Diagnosis not present

## 2022-04-18 DIAGNOSIS — I10 Essential (primary) hypertension: Secondary | ICD-10-CM | POA: Diagnosis not present

## 2022-04-18 LAB — CBC WITH DIFFERENTIAL/PLATELET
Basophils Absolute: 0.1 10*3/uL (ref 0.0–0.1)
Basophils Relative: 0.8 % (ref 0.0–3.0)
Eosinophils Absolute: 2.1 10*3/uL — ABNORMAL HIGH (ref 0.0–0.7)
Eosinophils Relative: 26.8 % — ABNORMAL HIGH (ref 0.0–5.0)
HCT: 40 % (ref 36.0–46.0)
Hemoglobin: 13.1 g/dL (ref 12.0–15.0)
Lymphocytes Relative: 26.2 % (ref 12.0–46.0)
Lymphs Abs: 2.1 10*3/uL (ref 0.7–4.0)
MCHC: 32.8 g/dL (ref 30.0–36.0)
MCV: 95.7 fl (ref 78.0–100.0)
Monocytes Absolute: 0.7 10*3/uL (ref 0.1–1.0)
Monocytes Relative: 8.6 % (ref 3.0–12.0)
Neutro Abs: 3 10*3/uL (ref 1.4–7.7)
Neutrophils Relative %: 37.6 % — ABNORMAL LOW (ref 43.0–77.0)
Platelets: 248 10*3/uL (ref 150.0–400.0)
RBC: 4.18 Mil/uL (ref 3.87–5.11)
RDW: 14.2 % (ref 11.5–15.5)
WBC: 7.9 10*3/uL (ref 4.0–10.5)

## 2022-04-18 LAB — LIPID PANEL
Cholesterol: 158 mg/dL (ref 0–200)
HDL: 83.7 mg/dL (ref >39.00)
LDL Cholesterol: 63 mg/dL (ref 0–99)
NonHDL: 73.92
Total CHOL/HDL Ratio: 2
Triglycerides: 53 mg/dL (ref 0.0–149.0)
VLDL: 10.6 mg/dL (ref 0.0–40.0)

## 2022-04-18 LAB — BASIC METABOLIC PANEL
BUN: 17 mg/dL (ref 6–23)
CO2: 30 mEq/L (ref 19–32)
Calcium: 9.5 mg/dL (ref 8.4–10.5)
Chloride: 104 mEq/L (ref 96–112)
Creatinine, Ser: 0.71 mg/dL (ref 0.40–1.20)
GFR: 85.2 mL/min (ref >60.00)
Glucose, Bld: 93 mg/dL (ref 70–99)
Potassium: 4.9 mEq/L (ref 3.5–5.1)
Sodium: 142 mEq/L (ref 135–145)

## 2022-04-18 LAB — HEPATIC FUNCTION PANEL
ALT: 13 U/L (ref 0–35)
AST: 18 U/L (ref 0–37)
Albumin: 4.2 g/dL (ref 3.5–5.2)
Alkaline Phosphatase: 93 U/L (ref 39–117)
Bilirubin, Direct: 0.1 mg/dL (ref 0.0–0.3)
Total Bilirubin: 0.6 mg/dL (ref 0.2–1.2)
Total Protein: 7.4 g/dL (ref 6.0–8.3)

## 2022-04-18 LAB — URINALYSIS, ROUTINE W REFLEX MICROSCOPIC
Bilirubin Urine: NEGATIVE
Hgb urine dipstick: NEGATIVE
Ketones, ur: NEGATIVE
Leukocytes,Ua: NEGATIVE
Nitrite: NEGATIVE
Specific Gravity, Urine: 1.01 (ref 1.000–1.030)
Total Protein, Urine: NEGATIVE
Urine Glucose: NEGATIVE
Urobilinogen, UA: 1 (ref 0.0–1.0)
pH: 7 (ref 5.0–8.0)

## 2022-04-18 LAB — TSH: TSH: 1.11 u[IU]/mL (ref 0.35–5.50)

## 2022-04-18 LAB — VITAMIN B12: Vitamin B-12: 571 pg/mL (ref 211–911)

## 2022-04-18 LAB — VITAMIN D 25 HYDROXY (VIT D DEFICIENCY, FRACTURES): VITD: 57.18 ng/mL (ref 30.00–100.00)

## 2022-04-24 ENCOUNTER — Encounter: Payer: Self-pay | Admitting: Internal Medicine

## 2022-04-24 ENCOUNTER — Ambulatory Visit (INDEPENDENT_AMBULATORY_CARE_PROVIDER_SITE_OTHER): Payer: Medicare HMO | Admitting: Internal Medicine

## 2022-04-24 VITALS — BP 130/84 | HR 95 | Temp 98.4°F | Ht 63.0 in | Wt 200.4 lb

## 2022-04-24 DIAGNOSIS — E785 Hyperlipidemia, unspecified: Secondary | ICD-10-CM

## 2022-04-24 DIAGNOSIS — R931 Abnormal findings on diagnostic imaging of heart and coronary circulation: Secondary | ICD-10-CM | POA: Diagnosis not present

## 2022-04-24 DIAGNOSIS — R7302 Impaired glucose tolerance (oral): Secondary | ICD-10-CM | POA: Diagnosis not present

## 2022-04-24 DIAGNOSIS — J441 Chronic obstructive pulmonary disease with (acute) exacerbation: Secondary | ICD-10-CM | POA: Diagnosis not present

## 2022-04-24 DIAGNOSIS — E538 Deficiency of other specified B group vitamins: Secondary | ICD-10-CM | POA: Diagnosis not present

## 2022-04-24 DIAGNOSIS — Z87891 Personal history of nicotine dependence: Secondary | ICD-10-CM | POA: Diagnosis not present

## 2022-04-24 DIAGNOSIS — E559 Vitamin D deficiency, unspecified: Secondary | ICD-10-CM

## 2022-04-24 MED ORDER — METHYLPREDNISOLONE ACETATE 80 MG/ML IJ SUSP
80.0000 mg | Freq: Once | INTRAMUSCULAR | Status: AC
  Administered 2022-04-24: 80 mg via INTRAMUSCULAR

## 2022-04-24 MED ORDER — PREDNISONE 10 MG PO TABS: ORAL_TABLET | ORAL | 0 refills | Status: DC

## 2022-04-24 MED ORDER — ALBUTEROL SULFATE HFA 108 (90 BASE) MCG/ACT IN AERS
1.0000 | INHALATION_SPRAY | RESPIRATORY_TRACT | 5 refills | Status: DC | PRN
Start: 2022-04-24 — End: 2022-10-27

## 2022-04-24 NOTE — Progress Notes (Signed)
Patient ID: Crystal [name], female   DOB: May 20, 1951, 71 y.o.   MRN: [id number]        Chief Complaint: follow up cough with sob/wheezing, allergies, CAD, low vit d, hyperglycemia       HPI:  [name] is a 71 y.o. female here with c/o 3 days onset mild to mod scant prod cough with sob/wheezing where she is using her inhaler 4 times per day instead of just a couple of times per wk.  Pt denies chest pain, orthopnea, PND, increased LE swelling, palpitations, dizziness or syncope.   Pt denies polydipsia, polyuria, or new focal neuro s/s.   Pt denies fever, wt loss, night sweats, loss of appetite, or other constitutional symptoms   Taking Vit D       Wt Readings from Last 3 Encounters:  04/24/22 200 lb 6 oz (90.9 kg)  02/24/22 203 lb (92.1 kg)  01/03/22 203 lb (92.1 kg)   BP Readings from Last 3 Encounters:  04/24/22 130/84  02/24/22 134/88  01/03/22 136/80         Past Medical History:  Diagnosis Date   ALLERGIC RHINITIS    Allergy    ANXIETY    no medations needed   Arthritis    Asthma    Bronchitis    Cataract    no surgery   COLONIC POLYPS, HX OF    Complication of anesthesia    oxygen sat dropped during endoscopy    COPD    pt unsure of this dx   Cough    DEPRESSION    Elevated coronary artery calcium score 12/2021   Agatston score 173-all LAD   Fatty liver    GERD    GLUCOSE INTOLERANCE    HYPERTENSION    IBS    Impaired glucose tolerance    Obstructive sleep apnea 06/25/2018   OSTEOPENIA    Osteopenia    Osteoporosis 07/30/2017   PERIPHERAL EDEMA    Pneumonia    Pneumonia, organism unspecified(486)    Sleep apnea    Oral Aparatus   TOBACCO USE DISORDER/SMOKER-SMOKING CESSATION DISCUSSED    Past Surgical History:  Procedure Laterality Date   birthmark removed     from back as a child   bladder mesh     2012   CESAREAN SECTION     x2   CHOLECYSTECTOMY     COLONOSCOPY  2018   TA   MOUTH SURGERY  04/25/2020   tissue graft -room of mouth to  front gums   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   Procedure: LEFT TOTAL HIP ARTHROPLASTY ANTERIOR APPROACH;  Surgeon: Melrose Nakayama, MD;  Location: [facility];  Service: Orthopedics;  Laterality: Left;   WISDOM TOOTH EXTRACTION      reports that she quit smoking about 8 months ago. Her smoking use included cigarettes. She started smoking about 44 years ago. She has a 42.00 pack-year smoking history. She has never used smokeless tobacco. She reports current alcohol use of about 21.0 standard drinks of alcohol per week. She reports that she does not use drugs. family history includes Allergic rhinitis in her sister; Anxiety disorder in an other family member; Bladder Cancer in her father; Breast cancer in her sister; Breast cancer (age of onset: 87) in her sister; Cervical cancer in her sister; Colon cancer in her father and paternal grandfather; Colon polyps in her father; Diabetes in an other family member; Heart attack (age of onset: 75) in her maternal grandmother; Heart  attack (age of onset: 1) in her maternal uncle; Hyperlipidemia in an other family member; Hypothyroidism in an other family member; Melanoma in her paternal grandfather; Other in her mother; Skin cancer in her brother, father, and mother; Thyroid cancer (age of onset: 5) in her paternal grandmother. Allergies  Allergen Reactions   Ace Inhibitors Cough   Penicillins Rash   Current Outpatient Medications on File Prior to Visit  Medication Sig Dispense Refill   aspirin 81 MG EC tablet Take 1 tablet (81 mg total) by mouth daily. Swallow whole. 30 tablet 12   atorvastatin (LIPITOR) 40 MG tablet Take 1 tablet (40 mg total) by mouth daily. 90 tablet 3   Calcium Carb-Cholecalciferol 600-800 MG-UNIT TABS Take 1 tablet by mouth in the morning and at bedtime.      Cholecalciferol (VITAMIN D PO) Take 2,000 Units by mouth daily.      denosumab (PROLIA) 60 MG/ML SOSY injection Inject 60 mg into the skin every 6 (six) months.     folic  acid (FOLVITE) 623 MCG tablet Take 400 mcg by mouth daily.     losartan (COZAAR) 50 MG tablet TAKE 1 TABLET EVERY DAY (ANNUAL APPOINTMENT DUE IN MAY, MUST SEE MD FOR FUTURE REFILLS) 90 tablet 3   metoprolol succinate (TOPROL-XL) 25 MG 24 hr tablet TAKE 1 TABLET BY MOUTH EVERY DAY 90 tablet 3   mirabegron ER (MYRBETRIQ) 25 MG TB24 tablet Take 25 mg by mouth daily.     vitamin B-12 (CYANOCOBALAMIN) 1000 MCG tablet Take 1 tablet (1,000 mcg total) by mouth daily. 90 tablet 3   MYRBETRIQ 50 MG TB24 tablet Take 50 mg by mouth daily.     No current facility-administered medications on file prior to visit.        ROS:  All others reviewed and negative.  Objective        PE:  BP 130/84   Pulse 95   Temp 98.4 F (36.9 C) (Oral)   Ht 5' 3"  (1.6 m)   Wt 200 lb 6 oz (90.9 kg)   SpO2 96%   BMI 35.49 kg/m                 Constitutional: Pt appears in NAD, not ill appearing, non toxic               HENT: Head: NCAT.                Right Ear: External ear normal.                 Left Ear: External ear normal. Bilat tm's with mild erythema.  Max sinus areasnon tender.  Pharynx with mild erythema, no exudate               Eyes: . Pupils are equal, round, and reactive to light. Conjunctivae and EOM are normal               Nose: without d/c or deformity               Neck: Neck supple. Gross normal ROM               Cardiovascular: Normal rate and regular rhythm.                 Pulmonary/Chest: Effort normal and breath sounds without rales but with few bilateral wheezing.  Neurological: Pt is alert. At baseline orientation, motor grossly intact               Skin: Skin is warm. No rashes, no other new lesions, LE edema - none               Psychiatric: Pt behavior is normal without agitation   Micro: none  Cardiac tracings I have personally interpreted today:  none  Pertinent Radiological findings (summarize): none   Lab Results  Component Value Date   WBC 7.9  04/18/2022   HGB 13.1 04/18/2022   HCT 40.0 04/18/2022   PLT 248.0 04/18/2022   GLUCOSE 93 04/18/2022   CHOL 158 04/18/2022   TRIG 53.0 04/18/2022   HDL 83.70 04/18/2022   LDLDIRECT 82.2 07/01/2013   LDLCALC 63 04/18/2022   ALT 13 04/18/2022   AST 18 04/18/2022   NA 142 04/18/2022   K 4.9 04/18/2022   CL 104 04/18/2022   CREATININE 0.71 04/18/2022   BUN 17 04/18/2022   CO2 30 04/18/2022   TSH 1.11 04/18/2022   INR 1.0 08/31/2020   HGBA1C 5.5 03/11/2021   Assessment/Plan:  AMIYRAH LAMERE is a 71 y.o. White or Caucasian [1] female with  has a past medical history of ALLERGIC RHINITIS, Allergy, ANXIETY, Arthritis, Asthma, Bronchitis, Cataract, COLONIC POLYPS, HX OF, Complication of anesthesia, COPD, Cough, DEPRESSION, Elevated coronary artery calcium score (12/2021), Fatty liver, GERD, GLUCOSE INTOLERANCE, HYPERTENSION, IBS, Impaired glucose tolerance, Obstructive sleep apnea (06/25/2018), OSTEOPENIA, Osteopenia, Osteoporosis (07/30/2017), PERIPHERAL EDEMA, Pneumonia, Pneumonia, organism unspecified(486), Sleep apnea, and TOBACCO USE DISORDER/SMOKER-SMOKING CESSATION DISCUSSED.  Vitamin D deficiency Last vitamin D Lab Results  Component Value Date   VD25OH 57.18 04/18/2022   Stable, cont oral replacement   COPD exacerbation (HCC) Mild to mod, for depomedrol IM 80 mg x 1, prednisone taper, cont inhaler with refill, consider f/u pulmonry if not improved  Impaired glucose tolerance Lab Results  Component Value Date   HGBA1C 5.5 03/11/2021   Stable, pt to continue current medical treatment  - diet, wt control, excercise   Elevated coronary artery calcium score-173 Stable, now on ASA 81 mg and statin,  to f/u any worsening symptoms or concerns  Former smoker Quit x 8 mo, I encouraged stay quit  Hyperlipidemia with target LDL less than 70 Lab Results  Component Value Date   LDLCALC 63 04/18/2022   Stable, goal ldl < 70, pt to continue current statin lipitor 40 mg  qd  Followup: Return in about 6 months (around 10/25/2022).  Cathlean Cower, MD 04/25/2022 9:06 AM [facility]

## 2022-04-24 NOTE — Patient Instructions (Signed)
You had the steroid shot today  Please take all new medication as prescribed - the prednisone  Please continue all other medications as before, and refills have been done if requested - the inhaler  Please have the pharmacy call with any other refills you may need.  Please continue your efforts at being more active, low cholesterol diet, and weight control.  Please keep your appointments with your specialists as you may have planned  Please make an Appointment to return in 6 months, or sooner if needed, also with Lab Appointment for testing done 3-5 days before at the [facility] (so this is for TWO appointments - please see the scheduling desk as you leave)

## 2022-04-25 ENCOUNTER — Encounter: Payer: Self-pay | Admitting: Internal Medicine

## 2022-04-25 NOTE — Assessment & Plan Note (Signed)
Quit x 8 mo, I encouraged stay quit

## 2022-04-25 NOTE — Assessment & Plan Note (Signed)
Lab Results  Component Value Date   HGBA1C 5.5 03/11/2021   Stable, pt to continue current medical treatment  - diet, wt control, excercise

## 2022-04-25 NOTE — Assessment & Plan Note (Signed)
Last vitamin D Lab Results  Component Value Date   VD25OH 57.18 04/18/2022   Stable, cont oral replacement

## 2022-04-25 NOTE — Assessment & Plan Note (Signed)
Lab Results  Component Value Date   LDLCALC 63 04/18/2022   Stable, goal ldl < 70, pt to continue current statin lipitor 40 mg qd

## 2022-04-25 NOTE — Assessment & Plan Note (Signed)
Stable, now on ASA 81 mg and statin,  to f/u any worsening symptoms or concerns

## 2022-04-25 NOTE — Assessment & Plan Note (Signed)
Mild to mod, for depomedrol IM 80 mg x 1, prednisone taper, cont inhaler with refill, consider f/u pulmonry if not improved

## 2022-05-07 ENCOUNTER — Encounter: Payer: Self-pay | Admitting: Internal Medicine

## 2022-05-07 NOTE — Progress Notes (Signed)
HPI Female Smoker followed for OSA/ oral appliance, complicated by allergic rhinitis, anxiety, COPD, HBP, GERD HST 07/20/2018-AHI 20/hour, desaturation to 81%, body weight 192 pounds PFT 09/04/21- WNL. Possible restriction  ====================================================================   11/08/21- 71 year old female former Smoker (quit October 13) followed for OSA, complicated by allergic rhinitis, anxiety, COPD, HBP, GERD Allergist was following COPD    CPAP auto 5-15/ [employer] Download- compliance 100%, AHI 0.2/ hr Body weight today-199 lbs Covid vax-4 Phizer Flu vax-declines Pharmacy Smoking Program- Nicorette gum -----Patient states that she has shortness of breath with activity and swelling in both legs but more in the left leg. Denies cough Download reviewed.  She is very comfortable with her CPAP and falls asleep as soon as she puts it on. She is at least more aware of dyspnea with exertion since she has quit smoking-discussed.  She thinks her legs have been swelling some, especially the left lower leg.  She has had some discomfort at the left ankle.  She is aware of atherosclerosis on her CT.  Since PFT was normal in November and chest exam is clear, we will get appropriate lab work including check for D-dimer, but I have asked her to discuss possible cardiac evaluation with her primary physician, Dr. Jenny Reichmann.  05/08/22-71 year old female former Smoker (42pkyrs) followed for OSA, complicated by allergic rhinitis, anxiety, COPD, HBP, GERD Allergist  and PCP were following COPD    Covid vax-4 Phizer CPAP auto 5-15/ [employer] Download- compliance  100%, AHI 0.3/ hr Body weight today-203 lbs CT chest low dose screening due in Sept. Download reviewed.  Good compliance and control. Known COPD is followed by her primary physician and by her allergist. Blames recent [place] smoke for increased cough.  Felt better after a prednisone course from Dr. Jenny Reichmann.  Residual sputum is clear and  sinuses now feel clear.  [facility] the [facility] are [facility].  ROS-see HPI   + = positive Constitutional:    weight loss, night sweats, fevers, chills, +fatigue, lassitude. HEENT:    headaches, difficulty swallowing, tooth/dental problems, sore throat,       sneezing, itching, ear ache, nasal congestion, post nasal drip, snoring CV:    chest pain, orthopnea, PND, +swelling in lower extremities, anasarca,                                   dizziness, palpitations Resp:  + shortness of breath with exertion or at rest.                productive cough,   non-productive cough, coughing up of blood.              change in color of mucus.  wheezing.   Skin:    rash or lesions. GI:  No-   heartburn, indigestion, abdominal pain, nausea, vomiting, diarrhea,                 change in bowel habits, loss of appetite GU: dysuria, change in color of urine, no urgency or frequency.   flank pain. MS:   joint pain, stiffness, decreased range of motion, back pain. Neuro-     nothing unusual Psych:  change in mood or affect.  depression or anxiety.   memory loss.  OBJ- Physical Exam General- Alert, Oriented, Affect-appropriate, Distress- none acute, + overweight Skin- rash-none, lesions- none, excoriation- none Lymphadenopathy- none Head- atraumatic  Eyes- Gross vision intact, PERRLA, conjunctivae and secretions clear            Ears- Hearing, canals-normal            Nose- Clear, no-Septal dev, mucus, polyps, erosion, perforation             Throat- Mallampati II-III , mucosa clear , drainage- none, tonsils- atrophic Neck- flexible , trachea midline, no stridor , thyroid nl, carotid no bruit Chest - symmetrical excursion , unlabored           Heart/CV- RRR , no murmur , no gallop  , no rub, nl s1 s2                           - JVD- none , edema- none, stasis changes- none, varices- none           Lung- clear to P&A, wheeze- none, cough+light, dullness-none, rub- none           Chest  wall-  Abd-  Br/ Gen/ Rectal- Not done, not indicated Extrem- +heavy legs with small superficial varices, but no pitting edema. Neg Homan's. Neuro- grossly intact to observation

## 2022-05-08 ENCOUNTER — Encounter: Payer: Self-pay | Admitting: Internal Medicine

## 2022-05-08 ENCOUNTER — Ambulatory Visit: Payer: Medicare HMO | Admitting: Internal Medicine

## 2022-05-08 DIAGNOSIS — J449 Chronic obstructive pulmonary disease, unspecified: Secondary | ICD-10-CM | POA: Diagnosis not present

## 2022-05-08 DIAGNOSIS — G4733 Obstructive sleep apnea (adult) (pediatric): Secondary | ICD-10-CM

## 2022-05-08 NOTE — Patient Instructions (Signed)
We can continue CPAP auto 5-15  Please call if we can help

## 2022-05-21 ENCOUNTER — Ambulatory Visit: Payer: Medicare HMO | Admitting: Family Medicine

## 2022-05-26 ENCOUNTER — Encounter: Payer: Self-pay | Admitting: Allergy

## 2022-05-26 ENCOUNTER — Ambulatory Visit: Payer: Medicare HMO | Admitting: Allergy

## 2022-05-26 ENCOUNTER — Telehealth: Payer: Self-pay

## 2022-05-26 VITALS — BP 110/70 | HR 107 | Temp 98.4°F | Resp 16 | Ht 63.0 in | Wt 197.5 lb

## 2022-05-26 DIAGNOSIS — J449 Chronic obstructive pulmonary disease, unspecified: Secondary | ICD-10-CM | POA: Insufficient documentation

## 2022-05-26 DIAGNOSIS — D7219 Other eosinophilia: Secondary | ICD-10-CM | POA: Diagnosis not present

## 2022-05-26 DIAGNOSIS — J3089 Other allergic rhinitis: Secondary | ICD-10-CM

## 2022-05-26 DIAGNOSIS — J302 Other seasonal allergic rhinitis: Secondary | ICD-10-CM

## 2022-05-26 MED ORDER — RYALTRIS 665-25 MCG/ACT NA SUSP: 1.0000 | Freq: Two times a day (BID) | NASAL | 5 refills | Status: DC

## 2022-05-26 MED ORDER — BREZTRI AEROSPHERE 160-9-4.8 MCG/ACT IN AERO: 2.0000 | INHALATION_SPRAY | Freq: Two times a day (BID) | RESPIRATORY_TRACT | 2 refills | Status: DC

## 2022-05-26 NOTE — Assessment & Plan Note (Addendum)
Was doing well until June and required oral prednisone and IM steroid. Breathing is not back to baseline. Quit smoking.    Today's spirometry showed 10% improvement in FEV1 post bronchodilator treatment. Clinically feeling improved.  . Daily controller medication(s):  o Start Breztri 2 puffs twice a day with spacer and rinse mouth afterwards. Samples given.  . May use albuterol rescue inhaler 2 puffs every 4 to 6 hours as needed for shortness of breath, chest tightness, coughing, and wheezing. May use albuterol rescue inhaler 2 puffs 5 to 15 minutes prior to strenuous physical activities. Monitor frequency of use.   Get spirometry at next visit.

## 2022-05-26 NOTE — Assessment & Plan Note (Signed)
Past history - 2019 skin testing was positive to dust mites, grass, trees and borderline to mouse. Interim history - only using meds prn.  Continue environmental control measures.   May use over the counter antihistamines such as Zyrtec (cetirizine), Claritin (loratadine), Allegra (fexofenadine), or Xyzal (levocetirizine) daily as needed.  Start [name] (olopatadine + mometasone nasal spray combination) 1-2 sprays per nostril twice a day. Sample given.  This replaces your other nasal sprays.  If this works well for you, then have [facility] ship the medication to your home - prescription already sent in.   Nasal saline spray (i.e., Simply Saline) or nasal saline lavage (i.e., NeilMed) is recommended as needed and prior to medicated nasal sprays.

## 2022-05-26 NOTE — Telephone Encounter (Signed)
Pt called back and states she has set up an apptmnt with Allergy and Asthma specialist for today at 10:40. NO longer needs a referral.

## 2022-05-26 NOTE — Assessment & Plan Note (Signed)
04/18/2022 eos elevated at 2100. No prior history.  Get repeat CBC diff. If still elevated then will need further work up.

## 2022-05-26 NOTE — Telephone Encounter (Signed)
Pt has stated she needs a referral ton Allery & Asthma specialist.. Pt has stated she would like to speak to Dr. Gwynn Burly nurse her self.   Pts number is [phone]

## 2022-05-26 NOTE — Progress Notes (Signed)
Follow Up Note  RE: Crystal [name] MRN: [id number] DOB: 1951/02/18 Date of Office Visit: 05/26/2022  Referring provider: Biagio Borg, MD Primary care provider: Biagio Borg, MD  Chief Complaint: Follow-up, Asthma, and Cough (End of June)  History of Present Illness: I had the pleasure of seeing Crystal [name] for a follow up visit at the [facility] on 05/26/2022. She is a 71 y.o. female, who is being followed for COPD, allergic rhinitis. Her previous allergy office visit was on 02/18/2021 with Dr. Maudie Mercury. Today is a new complaint visit of coughing .  COPD Patient has been having issues sine June with coughing (some phlegm), shortness of breath, chest tightness, wheezing.  Patient finished prednisone and IM steroid injection in July with some benefit.  Currently using albuterol as needed which helps for a few hours a time.  Prior to this episode only needed to use albuterol prior to exertion.  No fevers/chills.  Quit smoking.   No prior history of elevated eos.  Follows with cardiology.    Seasonal and perennial allergic rhinitis Currently not taking any antihistamines.  Taking saline and Flonase in the mornings prn.   05/08/2022 pulmonology visit: "We can continue CPAP auto 5-15"  04/24/2022 PCP visit: "Crystal [name] is a 71 y.o. female here with c/o 3 days onset mild to mod scant prod cough with sob/wheezing where she is using her inhaler 4 times per day instead of just a couple of times per wk.  Pt denies chest pain, orthopnea, PND, increased LE swelling, palpitations, dizziness or syncope.   Pt denies polydipsia, polyuria, or new focal neuro s/s.   Pt denies fever, wt loss, night sweats, loss of appetite, or other constitutional symptoms   Taking Vit D"  Assessment and Plan: Crystal [name] is a 71 y.o. female with: COPD with asthma ([place]) Was doing well until June and required oral prednisone and IM steroid. Breathing is not back to baseline. Quit smoking.    Today's spirometry showed 10% improvement in FEV1 post bronchodilator treatment. Clinically feeling improved.  Daily controller medication(s):  Start Breztri 2 puffs twice a day with spacer and rinse mouth afterwards. Samples given.  May use albuterol rescue inhaler 2 puffs every 4 to 6 hours as needed for shortness of breath, chest tightness, coughing, and wheezing. May use albuterol rescue inhaler 2 puffs 5 to 15 minutes prior to strenuous physical activities. Monitor frequency of use.  Get spirometry at next visit.  Other allergic rhinitis Past history - 2019 skin testing was positive to dust mites, grass, trees and borderline to mouse. Interim history - only using meds prn. Continue environmental control measures.  May use over the counter antihistamines such as Zyrtec (cetirizine), Claritin (loratadine), Allegra (fexofenadine), or Xyzal (levocetirizine) daily as needed. Start [name] (olopatadine + mometasone nasal spray combination) 1-2 sprays per nostril twice a day. Sample given. This replaces your other nasal sprays. If this works well for you, then have [facility] ship the medication to your home - prescription already sent in.  Nasal saline spray (i.e., Simply Saline) or nasal saline lavage (i.e., NeilMed) is recommended as needed and prior to medicated nasal sprays.  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 No prior history. Get repeat CBC diff. If still elevated then will need further work up.  Return in about 6 weeks (around 07/07/2022).  Meds ordered this encounter  Medications   Budeson-Glycopyrrol-Formoterol (BREZTRI AEROSPHERE) 160-9-4.8 MCG/ACT AERO    Sig: Inhale 2 puffs into  the lungs in the morning and at bedtime. with spacer and rinse mouth afterwards.    Dispense:  10.7 g    Refill:  2   Olopatadine-Mometasone ([name]) 665-25 MCG/ACT SUSP    Sig: Place 1-2 sprays into the nose in the morning and at bedtime.    Dispense:  29 g    Refill:  5     [phone]   Lab Orders         CBC with Differential/Platelet      Diagnostics: Spirometry:  Tracings reviewed. Her effort: It was hard to get consistent efforts and there is a question as to whether this reflects a maximal maneuver. FVC: 1.74L FEV1: 1.52L, 73% predicted FEV1/FVC ratio: 87% Interpretation: No overt abnormalities noted given today's efforts with 10% improvement in FEV1 post bronchodilator treatment. Clinically feeling improved.   Please see scanned spirometry results for details.  Medication List:  Current Outpatient Medications  Medication Sig Dispense Refill   albuterol (VENTOLIN HFA) 108 (90 Base) MCG/ACT inhaler Inhale 1-2 puffs into the lungs every 4 (four) hours as needed for wheezing or shortness of breath (coughing). 18 g 5   aspirin 81 MG EC tablet Take 1 tablet (81 mg total) by mouth daily. Swallow whole. 30 tablet 12   atorvastatin (LIPITOR) 40 MG tablet Take 1 tablet (40 mg total) by mouth daily. 90 tablet 3   Budeson-Glycopyrrol-Formoterol (BREZTRI AEROSPHERE) 160-9-4.8 MCG/ACT AERO Inhale 2 puffs into the lungs in the morning and at bedtime. with spacer and rinse mouth afterwards. 10.7 g 2   Calcium Carb-Cholecalciferol 600-800 MG-UNIT TABS Take 1 tablet by mouth in the morning and at bedtime.      Cholecalciferol (VITAMIN D PO) Take 2,000 Units by mouth daily.      denosumab (PROLIA) 60 MG/ML SOSY injection Inject 60 mg into the skin every 6 (six) months.     folic acid (FOLVITE) 979 MCG tablet Take 400 mcg by mouth daily.     losartan (COZAAR) 50 MG tablet TAKE 1 TABLET EVERY DAY (ANNUAL APPOINTMENT DUE IN MAY, MUST SEE MD FOR FUTURE REFILLS) 90 tablet 3   metoprolol succinate (TOPROL-XL) 25 MG 24 hr tablet TAKE 1 TABLET BY MOUTH EVERY DAY 90 tablet 3   mirabegron ER (MYRBETRIQ) 25 MG TB24 tablet Take 25 mg by mouth daily.     MYRBETRIQ 50 MG TB24 tablet Take 50 mg by mouth daily.     Olopatadine-Mometasone ([name]) [id number] MCG/ACT SUSP Place 1-2  sprays into the nose in the morning and at bedtime. 29 g 5   vitamin B-12 (CYANOCOBALAMIN) 1000 MCG tablet Take 1 tablet (1,000 mcg total) by mouth daily. 90 tablet 3   No current facility-administered medications for this visit.   Allergies: Allergies  Allergen Reactions   Ace Inhibitors Cough   Penicillins Rash   I reviewed her past medical history, social history, family history, and environmental history and no significant changes have been reported from her previous visit.  Review of Systems  Constitutional:  Negative for appetite change, chills, fever and unexpected weight change.  HENT:  Negative for congestion and rhinorrhea.   Eyes:  Negative for itching.  Respiratory:  Positive for cough, chest tightness, shortness of breath and wheezing.   Gastrointestinal:  Negative for abdominal pain.  Skin:  Negative for rash.  Allergic/Immunologic: Positive for environmental allergies.  Neurological:  Negative for headaches.    Objective: BP 110/70   Pulse (!) 107   Temp 98.4 F (36.9 C)  Resp 16   Ht 5' 3"  (1.6 m)   Wt 197 lb 8 oz (89.6 kg)   SpO2 95%   BMI 34.99 kg/m  Body mass index is 34.99 kg/m. Physical Exam Vitals and nursing note reviewed.  Constitutional:      Appearance: Normal appearance. She is well-developed.  HENT:     Head: Normocephalic and atraumatic.     Right Ear: External ear normal.     Left Ear: External ear normal.     Nose: Nose normal.     Mouth/Throat:     Mouth: Mucous membranes are moist.     Pharynx: Oropharynx is clear.  Eyes:     Conjunctiva/sclera: Conjunctivae normal.  Cardiovascular:     Rate and Rhythm: Normal rate and regular rhythm.     Heart sounds: Normal heart sounds. No murmur heard. Pulmonary:     Effort: Pulmonary effort is normal.     Breath sounds: Normal breath sounds. No wheezing, rhonchi or rales.  Musculoskeletal:     Cervical back: Neck supple.  Skin:    General: Skin is warm.     Findings: No rash.   Neurological:     Mental Status: She is alert and oriented to person, place, and time.  Psychiatric:        Behavior: Behavior normal.    Previous notes and tests were reviewed. The plan was reviewed with the patient/family, and all questions/concerned were addressed.  It was my pleasure to see Sharica today and participate in her care. Please feel free to contact me with any questions or concerns.  Sincerely,  Rexene Alberts, DO Allergy & Immunology  [facility] office: [place] office: [phone]

## 2022-05-26 NOTE — Patient Instructions (Addendum)
Breathing: Daily controller medication(s):  Start Breztri 2 puffs twice a day with spacer and rinse mouth afterwards. May use albuterol rescue inhaler 2 puffs every 4 to 6 hours as needed for shortness of breath, chest tightness, coughing, and wheezing. May use albuterol rescue inhaler 2 puffs 5 to 15 minutes prior to strenuous physical activities. Monitor frequency of use.  Breathing ontrol goals:  Full participation in all desired activities (may need albuterol before activity) Albuterol use two times or less a week on average (not counting use with activity) Cough interfering with sleep two times or less a month Oral steroids no more than once a year No hospitalizations  Eosinophils Get bloodwork We are ordering labs, so please allow 1-2 weeks for the results to come back. With the newly implemented Cures Act, the labs might be visible to you at the same time that they become visible to me. However, I will not address the results until all of the results are back, so please be patient.  In the meantime, continue recommendations in your patient instructions, including avoidance measures (if applicable), until you hear from me.  Allergic rhinitis: 2019 skin testing was positive to dust mites, grass, trees and borderline to mouse. Continue environmental control measures.  May use over the counter antihistamines such as Zyrtec (cetirizine), Claritin (loratadine), Allegra (fexofenadine), or Xyzal (levocetirizine) daily as needed. Start [name] (olopatadine + mometasone nasal spray combination) 1-2 sprays per nostril twice a day. Sample given. This replaces your other nasal sprays. If this works well for you, then have [facility] ship the medication to your home - prescription already sent in.  Nasal saline spray (i.e., Simply Saline) or nasal saline lavage (i.e., NeilMed) is recommended as needed and prior to medicated nasal sprays.  Follow up in 6 weeks or sooner if needed.  Our [place]  office is moving in September 2023 to a new location. New address: [address] [place], [place], [place] [address] (white building). [place] office: [phone] (same phone number).

## 2022-05-27 ENCOUNTER — Other Ambulatory Visit: Payer: Self-pay | Admitting: Allergy

## 2022-05-27 DIAGNOSIS — D7219 Other eosinophilia: Secondary | ICD-10-CM

## 2022-05-27 DIAGNOSIS — G4733 Obstructive sleep apnea (adult) (pediatric): Secondary | ICD-10-CM | POA: Diagnosis not present

## 2022-05-27 LAB — CBC WITH DIFFERENTIAL/PLATELET
Basophils Absolute: 0.1 10*3/uL (ref 0.0–0.2)
Basos: 1 %
EOS (ABSOLUTE): 1.9 10*3/uL — ABNORMAL HIGH (ref 0.0–0.4)
Eos: 24 %
Hematocrit: 41.4 % (ref 34.0–46.6)
Hemoglobin: 14 g/dL (ref 11.1–15.9)
Immature Grans (Abs): 0 10*3/uL (ref 0.0–0.1)
Immature Granulocytes: 0 %
Lymphocytes Absolute: 2 10*3/uL (ref 0.7–3.1)
Lymphs: 24 %
MCH: 32 pg (ref 26.6–33.0)
MCHC: 33.8 g/dL (ref 31.5–35.7)
MCV: 95 fL (ref 79–97)
Monocytes Absolute: 0.9 10*3/uL (ref 0.1–0.9)
Monocytes: 11 %
Neutrophils Absolute: 3.2 10*3/uL (ref 1.4–7.0)
Neutrophils: 40 %
Platelets: 285 10*3/uL (ref 150–450)
RBC: 4.38 x10E6/uL (ref 3.77–5.28)
RDW: 12.8 % (ref 11.7–15.4)
WBC: 8 10*3/uL (ref 3.4–10.8)

## 2022-05-27 NOTE — Progress Notes (Signed)
Please call patient. Let her know that her eosinophils were still higher than I would like 1900 (previous one was 2100).   I need her to get some additional bloodwork for me.  She can come to our office to get it drawn or we can mail the labs to her home.   Keep the September appointment. Make sure she gets her CT chest done that was already ordered by another provider.

## 2022-05-29 DIAGNOSIS — D7219 Other eosinophilia: Secondary | ICD-10-CM | POA: Diagnosis not present

## 2022-06-02 LAB — IGG, IGA, IGM
IgA/Immunoglobulin A, Serum: 211 mg/dL (ref 64–422)
IgG (Immunoglobin G), Serum: 962 mg/dL (ref 586–1602)
IgM (Immunoglobulin M), Srm: 104 mg/dL (ref 26–217)

## 2022-06-02 LAB — COMPREHENSIVE METABOLIC PANEL
ALT: 18 IU/L (ref 0–32)
AST: 21 IU/L (ref 0–40)
Albumin/Globulin Ratio: 1.7 (ref 1.2–2.2)
Albumin: 4.5 g/dL (ref 3.8–4.8)
Alkaline Phosphatase: 81 IU/L (ref 44–121)
BUN/Creatinine Ratio: 16 (ref 12–28)
BUN: 13 mg/dL (ref 8–27)
Bilirubin Total: 0.4 mg/dL (ref 0.0–1.2)
CO2: 22 mmol/L (ref 20–29)
Calcium: 9.6 mg/dL (ref 8.7–10.3)
Chloride: 104 mmol/L (ref 96–106)
Creatinine, Ser: 0.79 mg/dL (ref 0.57–1.00)
Globulin, Total: 2.7 g/dL (ref 1.5–4.5)
Glucose: 96 mg/dL (ref 70–99)
Potassium: 4.5 mmol/L (ref 3.5–5.2)
Sodium: 145 mmol/L — ABNORMAL HIGH (ref 134–144)
Total Protein: 7.2 g/dL (ref 6.0–8.5)
eGFR: 79 mL/min/{1.73_m2} (ref >59)

## 2022-06-02 LAB — ANCA PROFILE
Anti-MPO Antibodies: <0.2 units (ref 0.0–0.9)
Anti-PR3 Antibodies: <0.2 units (ref 0.0–0.9)
Atypical pANCA: <1:20 {titer}
C-ANCA: <1:20 {titer}
P-ANCA: <1:20 {titer}

## 2022-06-02 LAB — ANA W/REFLEX: Anti Nuclear Antibody (ANA): NEGATIVE

## 2022-06-02 LAB — IGE: IgE (Immunoglobulin E), Serum: 71 IU/mL (ref 6–495)

## 2022-06-02 LAB — HIV ANTIBODY (ROUTINE TESTING W REFLEX): HIV Screen 4th Generation wRfx: NONREACTIVE

## 2022-06-02 LAB — C-REACTIVE PROTEIN: CRP: 1 mg/L (ref 0–10)

## 2022-06-02 LAB — VITAMIN B12: Vitamin B-12: 1683 pg/mL — ABNORMAL HIGH (ref 232–1245)

## 2022-06-02 LAB — STRONGYLOIDES, AB, IGG: Strongyloides, Ab, IgG: NEGATIVE

## 2022-06-02 LAB — TRYPTASE: Tryptase: 7.6 ug/L (ref 2.2–13.2)

## 2022-06-02 LAB — SEDIMENTATION RATE: Sed Rate: 14 mm/hr (ref 0–40)

## 2022-06-03 LAB — OVA AND PARASITE EXAMINATION

## 2022-06-04 ENCOUNTER — Telehealth: Payer: Self-pay

## 2022-06-04 NOTE — Telephone Encounter (Signed)
Per Dr. Maudie Mercury:   Please place referral for heme/onc for peripheral eosinophilia. Thank you.

## 2022-06-10 ENCOUNTER — Telehealth: Payer: Self-pay | Admitting: Oncology

## 2022-06-10 NOTE — Telephone Encounter (Signed)
Scheduled appt per 8/22 referral. Pt is aware of appt date and time. Pt is aware to arrive 15 mins prior to appt time and to bring and updated insurance card. Pt is aware of appt location.

## 2022-06-20 DIAGNOSIS — H25813 Combined forms of age-related cataract, bilateral: Secondary | ICD-10-CM | POA: Diagnosis not present

## 2022-06-20 DIAGNOSIS — H353131 Nonexudative age-related macular degeneration, bilateral, early dry stage: Secondary | ICD-10-CM | POA: Diagnosis not present

## 2022-06-20 DIAGNOSIS — D23122 Other benign neoplasm of skin of left lower eyelid, including canthus: Secondary | ICD-10-CM | POA: Diagnosis not present

## 2022-06-20 DIAGNOSIS — H02423 Myogenic ptosis of bilateral eyelids: Secondary | ICD-10-CM | POA: Diagnosis not present

## 2022-06-20 NOTE — Assessment & Plan Note (Signed)
Managed by her PCP and Allergist.  Understands the we can try to help if needed.

## 2022-06-20 NOTE — Assessment & Plan Note (Signed)
Benefits from CPAP with good compliance and control Plan-continue AutoPap 5-15

## 2022-06-24 NOTE — Progress Notes (Signed)
Crystal [name] [place] [address] [place] Phone: [phone] Subjective:   IVilma [name], am serving as a scribe for Dr. Hulan Saas.  I'm seeing this patient by the request  of:  Biagio Borg, MD  CC: Bilateral knee pain follow-up  QIH:[id number]  02/24/2022 Patient is doing well overall.  Discussed icing regimen and home exercise, discussed which activities to do and which ones to avoid, increase activity slowly otherwise.  Follow-up with me again in 6-8 worsening pain consider repeat the steroid injections into the viscosupplementation  Update 07/01/2022 Crystal [name] is a 71 y.o. female coming in with complaint of B knee pain. Patient states doing great. Has better ROM in her knees. Able to do her aquatic fitness. Doesn't feel like she needs an injection today.      Past Medical History:  Diagnosis Date   ALLERGIC RHINITIS    Allergy    ANXIETY    no medations needed   Arthritis    Asthma    Bronchitis    Cataract    no surgery   COLONIC POLYPS, HX OF    Complication of anesthesia    oxygen sat dropped during endoscopy    COPD    pt unsure of this dx   Cough    DEPRESSION    Elevated coronary artery calcium score 12/2021   Agatston score 173-all LAD   Fatty liver    GERD    GLUCOSE INTOLERANCE    HYPERTENSION    IBS    Impaired glucose tolerance    Obstructive sleep apnea 06/25/2018   OSTEOPENIA    Osteopenia    Osteoporosis 07/30/2017   PERIPHERAL EDEMA    Pneumonia    Pneumonia, organism unspecified(486)    Sleep apnea    Oral Aparatus   TOBACCO USE DISORDER/SMOKER-SMOKING CESSATION DISCUSSED    Past Surgical History:  Procedure Laterality Date   birthmark removed     from back as a child   bladder mesh     2012   CESAREAN SECTION     x2   CHOLECYSTECTOMY     COLONOSCOPY  2018   [facility]   MOUTH SURGERY  04/25/2020   tissue graft -room of mouth to front gums   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    Procedure: LEFT TOTAL HIP ARTHROPLASTY ANTERIOR APPROACH;  Surgeon: Melrose Nakayama, MD;  Location: [facility];  Service: Orthopedics;  Laterality: Left;   WISDOM TOOTH EXTRACTION     Social History   Socioeconomic History   Marital status: Widowed    Spouse name: Not on file   Number of children: 2   Years of education: Not on file   Highest education level: Not on file  Occupational History   Occupation: Occupational psychologist industries, signage for [employer]    Comment: Retired  Tobacco Use   Smoking status: Former    Packs/day: 1.00    Years: 42.00    Total pack years: 42.00    Types: Cigarettes    Start date: 10/20/1977    Quit date: 08/01/2021    Years since quitting: 0.9   Smokeless tobacco: Never  Vaping Use   Vaping Use: Never used  Substance and Sexual Activity   Alcohol use: Yes    Alcohol/week: 21.0 standard drinks of alcohol    Types: 7 Glasses of wine, 14 Standard drinks or equivalent per week    Comment: couple of drinks 1-2 nites per week  Drug use: No   Sexual activity: Not on file  Other Topics Concern   Not on file  Social History Narrative   She indicates that she has been most of her life sedentary, but started exercising in the fall of October 2022: Has started doing water aerobics.      She had work up to 4 days a week, but has had to scale down to about 2 days a week because of knee pain.   Social Determinants of Health   Financial Resource Strain: Low Risk  (04/07/2022)   Overall Financial Resource Strain (CARDIA)    Difficulty of Paying Living Expenses: Not hard at all  Food Insecurity: No Food Insecurity (04/07/2022)   Hunger Vital Sign    Worried About Running Out of Food in the Last Year: Never true    Ran Out of Food in the Last Year: Never true  Transportation Needs: No Transportation Needs (04/07/2022)   PRAPARE - Hydrologist (Medical): No    Lack of Transportation (Non-Medical): No  Physical Activity:  Sufficiently Active (04/07/2022)   Exercise Vital Sign    Days of Exercise per Week: 3 days    Minutes of Exercise per Session: 50 min  Stress: No Stress Concern Present (04/07/2022)   [place]    Feeling of Stress : Not at all  Social Connections: Socially Isolated (04/07/2022)   Social Connection and Isolation Panel [NHANES]    Frequency of Communication with Friends and Family: Three times a week    Frequency of Social Gatherings with Friends and Family: Three times a week    Attends Religious Services: Never    Active Member of Clubs or Organizations: No    Attends Archivist Meetings: Never    Marital Status: Widowed   Allergies  Allergen Reactions   Ace Inhibitors Cough   Penicillins Rash   Family History  Problem Relation Age of Onset   Breast cancer Sister 76       +lump and tamoxifen; eventually had bilateral mastectomies   Allergic rhinitis Sister    Colon cancer Father        dx. >58   Bladder Cancer Father        dx. 45-79; not a smoker   Skin cancer Father        basal cell carcinoma   Colon polyps Father    Colon cancer Paternal Grandfather        unspecified age   Melanoma Paternal Grandfather        unspecified age   Skin cancer Mother        NOS; dx. 19s   Other Mother        hx of hysterectomy in her early 89s for fibroid cysts   Heart attack Maternal Uncle 67   Heart attack Maternal Grandmother 62   Thyroid cancer Paternal Grandmother 88   Breast cancer Sister        dx. early 27s; s/p BL mastectomies   Cervical cancer Sister        dx. late 24s   Skin cancer Brother        NOS   Anxiety disorder Other    Hypothyroidism Other    Hyperlipidemia Other    Diabetes Other    Esophageal cancer Neg Hx    Stomach cancer Neg Hx    Rectal cancer Neg Hx     Current Outpatient Medications (Endocrine &  Metabolic):    denosumab (PROLIA) 60 MG/ML SOSY injection, Inject 60 mg into  the skin every 6 (six) months.  Current Outpatient Medications (Cardiovascular):    atorvastatin (LIPITOR) 40 MG tablet, Take 1 tablet (40 mg total) by mouth daily.   losartan (COZAAR) 50 MG tablet, TAKE 1 TABLET EVERY DAY (ANNUAL APPOINTMENT DUE IN MAY, MUST SEE MD FOR FUTURE REFILLS)   metoprolol succinate (TOPROL-XL) 25 MG 24 hr tablet, TAKE 1 TABLET BY MOUTH EVERY DAY  Current Outpatient Medications (Respiratory):    albuterol (VENTOLIN HFA) 108 (90 Base) MCG/ACT inhaler, Inhale 1-2 puffs into the lungs every 4 (four) hours as needed for wheezing or shortness of breath (coughing).   Budeson-Glycopyrrol-Formoterol (BREZTRI AEROSPHERE) 160-9-4.8 MCG/ACT AERO, Inhale 2 puffs into the lungs in the morning and at bedtime. with spacer and rinse mouth afterwards.   Olopatadine-Mometasone ([name]) 665-25 MCG/ACT SUSP, Place 1-2 sprays into the nose in the morning and at bedtime.  Current Outpatient Medications (Analgesics):    aspirin 81 MG EC tablet, Take 1 tablet (81 mg total) by mouth daily. Swallow whole.  Current Outpatient Medications (Hematological):    folic acid (FOLVITE) 682 MCG tablet, Take 400 mcg by mouth daily.   vitamin B-12 (CYANOCOBALAMIN) 1000 MCG tablet, Take 1 tablet (1,000 mcg total) by mouth daily. (Patient not taking: Reported on 06/27/2022)  Current Outpatient Medications (Other):    Calcium Carb-Cholecalciferol 600-800 MG-UNIT TABS, Take 1 tablet by mouth in the morning and at bedtime.    Cholecalciferol (VITAMIN D PO), Take 2,000 Units by mouth daily.    MYRBETRIQ 50 MG TB24 tablet, Take 50 mg by mouth daily.    Objective  Blood pressure 130/88, pulse 75, height 5' 3"  (1.6 m), weight 199 lb (90.3 kg), SpO2 97 %.   General: No apparent distress alert and oriented x3 mood and affect normal, dressed appropriately.  HEENT: Pupils equal, extraocular movements intact  Respiratory: Patient's speak in full sentences and does not appear short of breath  Cardiovascular: No  lower extremity edema, non tender, no erythema  Bilateral knee exam still shows trace effusion noted.  Mild crepitus noted.  Mild lateral tracking of the patella on the left side.  Full range of motion though.    Impression and Recommendations:    The above documentation has been reviewed and is accurate and complete Lyndal Pulley, DO

## 2022-06-26 NOTE — Progress Notes (Unsigned)
[facility] Initial Visit:  Patient Care Team: Biagio Borg, MD as PCP - General (Internal Medicine) Leonie Man, MD as PCP - Cardiology (Cardiology)  CHIEF COMPLAINTS/PURPOSE OF CONSULTATION:  Oncology History   No history exists.    HISTORY OF PRESENTING ILLNESS: Crystal [name] 71 y.o. female is here because of eosinophilia.    Medical history notable for allergic rhinitis, tobacco use, OSA on CPAP with good compliance  April 18, 2022: WBC 7.9 hemoglobin 13.1 platelet count 248; 38 segs 26 lymphs 9 monos 27 eos 1 basophil absolute eosinophil count 2.1  May 26, 2022: Allergist evaluation.  Diagnosed with Asthma WBC 8.0 hemoglobin 14.0 platelet count 285; 40 segs 24 lymphs 11 monos 1 base out 24 eos absolute eosinophil count 1.9 Placed on Breztri  and [name]   May 29 2022:  Stool O&P negative.   Serology for Strongyloides ease, HIV were negative. ESR 14, CRP 1  ANA negative ANCA negative. Ig G962 IgA 211 IgM 104 IgE 71 Vitamin B12 1683 tryptase 7.6 CMP notable for sodium 145  June 27 2022: [facility]  Patient reports that eosinophil count was first noted to be elevated in June 2023; she noted increased cough that week which she related to smoke from [place]. July 23rd to 28th 2023 vacationed in [place] and noted increased cough at night.  Saw PCP on August 7th because the cough and sinus drainage had worsened.  Patient has history of COPD but was never diagnosed with asthma until August.  She has not been on any antibiotics.  Respiratory symptoms in form of cough, chest tightness and nasal congestion have improved since starting Breztri and [name].   Only new medications are those scribed by allergist.  Due of Prolia in October 2023.   Last mammogram was October 2022 which was negative She has history of colon polyps.  Last meeting with genetic counselor was 12 yrs ago July 17 2022 due for CT lung  screening.    Social:  Widowed.  Former smoker.  EtOH formerly heavy drinker now down to 1 to 2 nights a week.    [place]:  Brother recently noted to have elevated eosinophil count Both sisters have history of breast cancer Father had colon and bladder cancer  Review of Systems  Constitutional:  Negative for fatigue, fever and unexpected weight change.       Occasional fleeting chills.    HENT:   Positive for voice change. Negative for mouth sores, nosebleeds, sore throat and trouble swallowing.        Voice change since developing the cough  Eyes:  Negative for eye problems and icterus.  Respiratory:  Negative for hemoptysis, shortness of breath and wheezing.        Chest tightness and cough have improved with medication  Cardiovascular:  Negative for chest pain and leg swelling.  Gastrointestinal:  Negative for abdominal pain, blood in stool, constipation, diarrhea, nausea and vomiting.  Endocrine: Positive for hot flashes.  Genitourinary:  Negative for dysuria, frequency and hematuria.   Musculoskeletal:  Negative for back pain, gait problem and myalgias.       Chronic mild arthralgias  Skin:  Negative for itching and rash.  Neurological:  Negative for dizziness, gait problem, seizures and speech difficulty.  Hematological:  Negative for adenopathy. Bruises/bleeds easily.  Psychiatric/Behavioral:  Negative for depression, sleep disturbance and suicidal ideas.     MEDICAL HISTORY: Past Medical History:  Diagnosis Date   ALLERGIC  RHINITIS    Allergy    ANXIETY    no medations needed   Arthritis    Asthma    Bronchitis    Cataract    no surgery   COLONIC POLYPS, HX OF    Complication of anesthesia    oxygen sat dropped during endoscopy    COPD    pt unsure of this dx   Cough    DEPRESSION    Elevated coronary artery calcium score 12/2021   Agatston score 173-all LAD   Fatty liver    GERD    GLUCOSE INTOLERANCE    HYPERTENSION    IBS    Impaired glucose tolerance     Obstructive sleep apnea 06/25/2018   OSTEOPENIA    Osteopenia    Osteoporosis 07/30/2017   PERIPHERAL EDEMA    Pneumonia    Pneumonia, organism unspecified(486)    Sleep apnea    Oral Aparatus   TOBACCO USE DISORDER/SMOKER-SMOKING CESSATION DISCUSSED     SURGICAL HISTORY: Past Surgical History:  Procedure Laterality Date   birthmark removed     from back as a child   bladder mesh     2012   CESAREAN SECTION     x2   CHOLECYSTECTOMY     COLONOSCOPY  2018   TA   MOUTH SURGERY  04/25/2020   tissue graft -room of mouth to front gums   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   Procedure: LEFT TOTAL HIP ARTHROPLASTY ANTERIOR APPROACH;  Surgeon: Melrose Nakayama, MD;  Location: [facility];  Service: Orthopedics;  Laterality: Left;   WISDOM TOOTH EXTRACTION      SOCIAL HISTORY: Social History   Socioeconomic History   Marital status: Widowed    Spouse name: Not on file   Number of children: 2   Years of education: Not on file   Highest education level: Not on file  Occupational History   Occupation: Occupational psychologist industries, signage for [employer]    Comment: Retired  Tobacco Use   Smoking status: Former    Packs/day: 1.00    Years: 42.00    Total pack years: 42.00    Types: Cigarettes    Start date: 10/20/1977    Quit date: 08/01/2021    Years since quitting: 0.9   Smokeless tobacco: Never  Vaping Use   Vaping Use: Never used  Substance and Sexual Activity   Alcohol use: Yes    Alcohol/week: 21.0 standard drinks of alcohol    Types: 7 Glasses of wine, 14 Standard drinks or equivalent per week    Comment: couple of drinks 1-2 nites per week    Drug use: No   Sexual activity: Not on file  Other Topics Concern   Not on file  Social History Narrative   She indicates that she has been most of her life sedentary, but started exercising in the fall of October 2022: Has started doing water aerobics.      She had work up to 4 days a week, but has had to scale  down to about 2 days a week because of knee pain.   Social Determinants of Health   Financial Resource Strain: Low Risk  (04/07/2022)   Overall Financial Resource Strain (CARDIA)    Difficulty of Paying Living Expenses: Not hard at all  Food Insecurity: No Food Insecurity (04/07/2022)   Hunger Vital Sign    Worried About Running Out of Food in the Last Year: Never true    Ran  Out of Food in the Last Year: Never true  Transportation Needs: No Transportation Needs (04/07/2022)   PRAPARE - Hydrologist (Medical): No    Lack of Transportation (Non-Medical): No  Physical Activity: Sufficiently Active (04/07/2022)   Exercise Vital Sign    Days of Exercise per Week: 3 days    Minutes of Exercise per Session: 50 min  Stress: No Stress Concern Present (04/07/2022)   [place]    Feeling of Stress : Not at all  Social Connections: Socially Isolated (04/07/2022)   Social Connection and Isolation Panel [NHANES]    Frequency of Communication with Friends and Family: Three times a week    Frequency of Social Gatherings with Friends and Family: Three times a week    Attends Religious Services: Never    Active Member of Clubs or Organizations: No    Attends Archivist Meetings: Never    Marital Status: Widowed  Intimate Partner Violence: Not At Risk (04/07/2022)   Humiliation, Afraid, Rape, and Kick questionnaire    Fear of Current or Ex-Partner: No    Emotionally Abused: No    Physically Abused: No    Sexually Abused: No    FAMILY HISTORY Family History  Problem Relation Age of Onset   Breast cancer Sister 89       +lump and tamoxifen; eventually had bilateral mastectomies   Allergic rhinitis Sister    Colon cancer Father        dx. >79   Bladder Cancer Father        dx. 25-79; not a smoker   Skin cancer Father        basal cell carcinoma   Colon polyps Father    Colon cancer Paternal  Grandfather        unspecified age   Melanoma Paternal Grandfather        unspecified age   Skin cancer Mother        NOS; dx. 27s   Other Mother        hx of hysterectomy in her early 58s for fibroid cysts   Heart attack Maternal Uncle 67   Heart attack Maternal Grandmother 62   Thyroid cancer Paternal Grandmother 88   Breast cancer Sister        dx. early 5s; s/p BL mastectomies   Cervical cancer Sister        dx. late 95s   Skin cancer Brother        NOS   Anxiety disorder Other    Hypothyroidism Other    Hyperlipidemia Other    Diabetes Other    Esophageal cancer Neg Hx    Stomach cancer Neg Hx    Rectal cancer Neg Hx     ALLERGIES:  is allergic to ace inhibitors and penicillins.  MEDICATIONS:  Current Outpatient Medications  Medication Sig Dispense Refill   albuterol (VENTOLIN HFA) 108 (90 Base) MCG/ACT inhaler Inhale 1-2 puffs into the lungs every 4 (four) hours as needed for wheezing or shortness of breath (coughing). 18 g 5   aspirin 81 MG EC tablet Take 1 tablet (81 mg total) by mouth daily. Swallow whole. 30 tablet 12   atorvastatin (LIPITOR) 40 MG tablet Take 1 tablet (40 mg total) by mouth daily. 90 tablet 3   Budeson-Glycopyrrol-Formoterol (BREZTRI AEROSPHERE) 160-9-4.8 MCG/ACT AERO Inhale 2 puffs into the lungs in the morning and at bedtime. with spacer and rinse mouth afterwards.  10.7 g 2   Calcium Carb-Cholecalciferol 600-800 MG-UNIT TABS Take 1 tablet by mouth in the morning and at bedtime.      Cholecalciferol (VITAMIN D PO) Take 2,000 Units by mouth daily.      denosumab (PROLIA) 60 MG/ML SOSY injection Inject 60 mg into the skin every 6 (six) months.     folic acid (FOLVITE) 654 MCG tablet Take 400 mcg by mouth daily.     losartan (COZAAR) 50 MG tablet TAKE 1 TABLET EVERY DAY (ANNUAL APPOINTMENT DUE IN MAY, MUST SEE MD FOR FUTURE REFILLS) 90 tablet 3   metoprolol succinate (TOPROL-XL) 25 MG 24 hr tablet TAKE 1 TABLET BY MOUTH EVERY DAY 90 tablet 3    mirabegron ER (MYRBETRIQ) 25 MG TB24 tablet Take 25 mg by mouth daily.     MYRBETRIQ 50 MG TB24 tablet Take 50 mg by mouth daily.     Olopatadine-Mometasone ([name]) [id number] MCG/ACT SUSP Place 1-2 sprays into the nose in the morning and at bedtime. 29 g 5   vitamin B-12 (CYANOCOBALAMIN) 1000 MCG tablet Take 1 tablet (1,000 mcg total) by mouth daily. 90 tablet 3   No current facility-administered medications for this visit.    PHYSICAL EXAMINATION:  ECOG PERFORMANCE STATUS: 0 - Asymptomatic   There were no vitals filed for this visit.  There were no vitals filed for this visit.   Physical Exam Vitals and nursing note reviewed.  Constitutional:      General: She is not in acute distress.    Appearance: Normal appearance. She is obese. She is not ill-appearing, toxic-appearing or diaphoretic.  HENT:     Head: Normocephalic and atraumatic.     Right Ear: External ear normal.     Left Ear: External ear normal.     Nose: Nose normal. No congestion or rhinorrhea.     Mouth/Throat:     Mouth: Mucous membranes are moist.     Pharynx: Oropharynx is clear. No oropharyngeal exudate or posterior oropharyngeal erythema.  Eyes:     General: No scleral icterus.    Extraocular Movements: Extraocular movements intact.     Conjunctiva/sclera: Conjunctivae normal.     Pupils: Pupils are equal, round, and reactive to light.  Cardiovascular:     Rate and Rhythm: Normal rate and regular rhythm.     Heart sounds: No murmur heard.    No friction rub. No gallop.  Pulmonary:     Effort: Pulmonary effort is normal. No respiratory distress.     Breath sounds: Normal breath sounds. No stridor. No wheezing, rhonchi or rales.  Chest:     Chest wall: No tenderness.  Abdominal:     General: Bowel sounds are normal. There is no distension.     Palpations: Abdomen is soft. There is no mass.     Tenderness: There is no abdominal tenderness. There is no guarding or rebound.     Hernia: No hernia is  present.  Musculoskeletal:        General: No swelling, tenderness or deformity.     Cervical back: Normal range of motion and neck supple. No rigidity or tenderness.  Lymphadenopathy:     Head:     Right side of head: No submental, submandibular, tonsillar, preauricular, posterior auricular or occipital adenopathy.     Left side of head: No submental, submandibular, tonsillar, preauricular, posterior auricular or occipital adenopathy.     Cervical: No cervical adenopathy.     Right cervical: No superficial, deep or posterior cervical  adenopathy.    Left cervical: No superficial, deep or posterior cervical adenopathy.     Upper Body:     Right upper body: No supraclavicular, axillary, pectoral or epitrochlear adenopathy.     Left upper body: No supraclavicular, axillary, pectoral or epitrochlear adenopathy.  Skin:    General: Skin is warm.     Coloration: Skin is not jaundiced.  Neurological:     General: No focal deficit present.     Mental Status: She is alert and oriented to person, place, and time. Mental status is at baseline.     Cranial Nerves: No cranial nerve deficit.     Motor: No weakness.     Gait: Gait normal.  Psychiatric:        Mood and Affect: Mood normal.        Behavior: Behavior normal.        Thought Content: Thought content normal.        Judgment: Judgment normal.      LABORATORY DATA: I have personally reviewed the data as listed:  No visits with results within 1 Month(s) from this visit.  Latest known visit with results is:  Orders Only on 05/27/2022  Component Date Value Ref Range Status   Tryptase 05/29/2022 7.6  2.2 - 13.2 ug/L Final   Vitamin B-12 05/29/2022 1,683 (H)  232 - 1,245 pg/mL Final   IgE (Immunoglobulin E), Serum 05/29/2022 71  6 - 495 IU/mL Final   IgG (Immunoglobin G), Serum 05/29/2022 962  586 - 1,602 mg/dL Final   IgA/Immunoglobulin A, Serum 05/29/2022 211  64 - 422 mg/dL Final   IgM (Immunoglobulin M), Srm 05/29/2022 104  26 -  217 mg/dL Final   Glucose 05/29/2022 96  70 - 99 mg/dL Final   BUN 05/29/2022 13  8 - 27 mg/dL Final   Creatinine, Ser 05/29/2022 0.79  0.57 - 1.00 mg/dL Final   eGFR 05/29/2022 79  >59 mL/min/1.73 Final   BUN/Creatinine Ratio 05/29/2022 16  12 - 28 Final   Sodium 05/29/2022 145 (H)  134 - 144 mmol/L Final   Potassium 05/29/2022 4.5  3.5 - 5.2 mmol/L Final   Chloride 05/29/2022 104  96 - 106 mmol/L Final   CO2 05/29/2022 22  20 - 29 mmol/L Final   Calcium 05/29/2022 9.6  8.7 - 10.3 mg/dL Final   Total Protein 05/29/2022 7.2  6.0 - 8.5 g/dL Final   Albumin 05/29/2022 4.5  3.8 - 4.8 g/dL Final   Globulin, Total 05/29/2022 2.7  1.5 - 4.5 g/dL Final   Albumin/Globulin Ratio 05/29/2022 1.7  1.2 - 2.2 Final   Bilirubin Total 05/29/2022 0.4  0.0 - 1.2 mg/dL Final   Alkaline Phosphatase 05/29/2022 81  44 - 121 IU/L Final   AST 05/29/2022 21  0 - 40 IU/L Final   ALT 05/29/2022 18  0 - 32 IU/L Final   Anti-MPO Antibodies 05/29/2022 <0.2  0.0 - 0.9 units Final   Anti-PR3 Antibodies 05/29/2022 <0.2  0.0 - 0.9 units Final   C-ANCA 05/29/2022 <1:20  Neg:<1:20 titer Final   P-ANCA 05/29/2022 <1:20  Neg:<1:20 titer Final   Comment: The presence of positive fluorescence exhibiting P-ANCA or C-ANCA patterns alone is not specific for the diagnosis of Wegener's Granulomatosis (WG) or microscopic polyangiitis. Decisions about treatment should not be based solely on ANCA IFA results.  The International ANCA Group Consensus recommends follow up testing of positive sera with both PR-3 and MPO-ANCA enzyme immunoassays. As many as 5% serum  samples are positive only by EIA. Ref. AM J Clin Pathol 1999;111:507-513.    Atypical pANCA 05/29/2022 <1:20  Neg:<1:20 titer Final   Comment: The atypical pANCA pattern has been observed in a significant percentage of patients with ulcerative colitis, primary sclerosing cholangitis and autoimmune hepatitis.    Anti Nuclear Antibody (ANA) 05/29/2022 Negative  Negative  Final   CRP 05/29/2022 1  0 - 10 mg/L Final   Sed Rate 05/29/2022 14  0 - 40 mm/hr Final   HIV Screen 4th Generation wRfx 05/29/2022 Non Reactive  Non Reactive Final   Comment: HIV Negative HIV-1/HIV-2 antibodies and HIV-1 p24 antigen were NOT detected. There is no laboratory evidence of HIV infection.    OVA + PARASITE EXAM 05/29/2022 Final report   Final   Comment: These results were obtained using wet preparation(s) and trichrome stained smear. This test does not include testing for Cryptosporidium parvum, Cyclospora, or Microsporidia.    O&P result 1 05/29/2022 Comment   Final   Comment: No ova, cysts, or parasites seen. One negative specimen does not rule out the possibility of a parasitic infection.    Strongyloides, Ab, IgG 05/29/2022 Negative  Negative Final    RADIOGRAPHIC STUDIES: I have personally reviewed the radiological images as listed and agree with the findings in the report  No results found.  ASSESSMENT/PLAN    Eosinophils:  Bone marrow-derived leukocytes whose development and terminal differentiation are under the control of IL-3, GM-CSF, and IL-5 , with IL-5 being the cytokine that is primarily responsible for eosinophilopoiesis.    Eosinophilia:  Defined as > 450 eosinophils/ul as measured by sampling peripheral blood, although eosinophils are predominantly found in peripheral tissues Hypereosinophilia is defined as eosinophil count of at least 1.5 x 10 9th for 6 months.    Possible conditions causing hypereosinophilia in this patient  Infectious Diseases Parasitic infections primarily with helminths Certain fungal infections (Allergic bronchopulmonary aspergillosis, Coccidiomycosis)  Allergic or Atopic Diseases Drug hypersensitivity or medication-associated eosinophilias Atopic diseases Asthma  Hematologic and Neoplastic Disorders Hypereosinophilic syndromes (HES) including chronic eosinophilic leukemia Leukemia (Acute myelogenous leukemias most  commonly, B cell ALL) Lymphomas (particularly Hodgkin's, T- and B-cell lymphomas) Tumor associated Adenocarcinomas Squamous carcinomas Large cell lung carcinomas Transitional cell carcinoma of the bladder Systemic mastocytosis  Endocrinologic Disorders Hypoadrenalism Other Atheroembolic Disorders Sarcoidosis   Evaluation:  CBC with diff, CMP, JAK 2, PCR for bcr-abl, PDGFRA, PDGFRB, FGFR1, RF, CCP,  Serologies for Parasites (Strongyloidies, Toxaocariasis, Trichinosis, Schistosomiasis), Fungus (Aspergillus, Coccidiodomycosis), celiac disease.   Consider bone marrow bx/aspirate to evaluate for myeloid and lymphoid abnormalities to include T-cell gene rearrangements.  Allergen-specific IgE tests.  Celiac disease.  Skin scrapings for scabies.    Patient to undergo CT lung screening, mammogram in the near future so will hold off on CT CAP.  Can consider urine cytology to screen for bladder cancer    Personal history of colon polyps and family history of malignancy:  Last meeting with genetic counselor was 12 yrs ago.  Consider referring back.     Cancer Staging  No matching staging information was found for the patient.   No problem-specific Assessment & Plan notes found for this encounter.   No orders of the defined types were placed in this encounter.   All questions were answered. The patient knows to call the clinic with any problems, questions or concerns.  This note was electronically signed.    Barbee Cough, MD  06/26/2022 2:01 PM

## 2022-06-27 ENCOUNTER — Inpatient Hospital Stay: Payer: Medicare HMO

## 2022-06-27 ENCOUNTER — Other Ambulatory Visit: Payer: Self-pay

## 2022-06-27 ENCOUNTER — Inpatient Hospital Stay: Payer: Medicare HMO | Attending: Oncology | Admitting: Oncology

## 2022-06-27 VITALS — BP 152/85 | HR 102 | Temp 98.0°F | Resp 15 | Wt 198.1 lb

## 2022-06-27 DIAGNOSIS — Z87891 Personal history of nicotine dependence: Secondary | ICD-10-CM | POA: Insufficient documentation

## 2022-06-27 DIAGNOSIS — Z8249 Family history of ischemic heart disease and other diseases of the circulatory system: Secondary | ICD-10-CM | POA: Diagnosis not present

## 2022-06-27 DIAGNOSIS — R0789 Other chest pain: Secondary | ICD-10-CM | POA: Insufficient documentation

## 2022-06-27 DIAGNOSIS — Z884 Allergy status to anesthetic agent status: Secondary | ICD-10-CM | POA: Insufficient documentation

## 2022-06-27 DIAGNOSIS — R0981 Nasal congestion: Secondary | ICD-10-CM | POA: Diagnosis not present

## 2022-06-27 DIAGNOSIS — Z8052 Family history of malignant neoplasm of bladder: Secondary | ICD-10-CM | POA: Diagnosis not present

## 2022-06-27 DIAGNOSIS — Z8719 Personal history of other diseases of the digestive system: Secondary | ICD-10-CM | POA: Insufficient documentation

## 2022-06-27 DIAGNOSIS — R499 Unspecified voice and resonance disorder: Secondary | ICD-10-CM | POA: Insufficient documentation

## 2022-06-27 DIAGNOSIS — R0989 Other specified symptoms and signs involving the circulatory and respiratory systems: Secondary | ICD-10-CM | POA: Diagnosis not present

## 2022-06-27 DIAGNOSIS — D72119 Hypereosinophilic syndrome (hes), unspecified: Secondary | ICD-10-CM | POA: Insufficient documentation

## 2022-06-27 DIAGNOSIS — Z880 Allergy status to penicillin: Secondary | ICD-10-CM | POA: Diagnosis not present

## 2022-06-27 DIAGNOSIS — G4733 Obstructive sleep apnea (adult) (pediatric): Secondary | ICD-10-CM | POA: Insufficient documentation

## 2022-06-27 DIAGNOSIS — K900 Celiac disease: Secondary | ICD-10-CM | POA: Insufficient documentation

## 2022-06-27 DIAGNOSIS — Z803 Family history of malignant neoplasm of breast: Secondary | ICD-10-CM | POA: Diagnosis not present

## 2022-06-27 DIAGNOSIS — R232 Flushing: Secondary | ICD-10-CM | POA: Insufficient documentation

## 2022-06-27 DIAGNOSIS — Z800 Family history of malignant neoplasm of digestive organs: Secondary | ICD-10-CM | POA: Insufficient documentation

## 2022-06-27 DIAGNOSIS — Z8371 Family history of colonic polyps: Secondary | ICD-10-CM | POA: Insufficient documentation

## 2022-06-27 DIAGNOSIS — Z8349 Family history of other endocrine, nutritional and metabolic diseases: Secondary | ICD-10-CM | POA: Insufficient documentation

## 2022-06-27 DIAGNOSIS — Z818 Family history of other mental and behavioral disorders: Secondary | ICD-10-CM | POA: Insufficient documentation

## 2022-06-27 DIAGNOSIS — Z833 Family history of diabetes mellitus: Secondary | ICD-10-CM | POA: Insufficient documentation

## 2022-06-27 DIAGNOSIS — Z79899 Other long term (current) drug therapy: Secondary | ICD-10-CM | POA: Insufficient documentation

## 2022-06-27 DIAGNOSIS — Z86010 Personal history of colonic polyps: Secondary | ICD-10-CM

## 2022-06-27 DIAGNOSIS — D721 Eosinophilia, unspecified: Secondary | ICD-10-CM | POA: Diagnosis not present

## 2022-06-27 DIAGNOSIS — Z9049 Acquired absence of other specified parts of digestive tract: Secondary | ICD-10-CM | POA: Insufficient documentation

## 2022-06-27 DIAGNOSIS — Z808 Family history of malignant neoplasm of other organs or systems: Secondary | ICD-10-CM | POA: Insufficient documentation

## 2022-06-27 DIAGNOSIS — J449 Chronic obstructive pulmonary disease, unspecified: Secondary | ICD-10-CM | POA: Diagnosis not present

## 2022-06-27 LAB — CBC WITH DIFFERENTIAL (CANCER CENTER ONLY)
Abs Immature Granulocytes: 0.02 10*3/uL (ref 0.00–0.07)
Basophils Absolute: 0.1 10*3/uL (ref 0.0–0.1)
Basophils Relative: 1 %
Eosinophils Absolute: 1.1 10*3/uL — ABNORMAL HIGH (ref 0.0–0.5)
Eosinophils Relative: 13 %
HCT: 40.9 % (ref 36.0–46.0)
Hemoglobin: 13.8 g/dL (ref 12.0–15.0)
Immature Granulocytes: 0 %
Lymphocytes Relative: 25 %
Lymphs Abs: 2.2 10*3/uL (ref 0.7–4.0)
MCH: 31.6 pg (ref 26.0–34.0)
MCHC: 33.7 g/dL (ref 30.0–36.0)
MCV: 93.6 fL (ref 80.0–100.0)
Monocytes Absolute: 0.7 10*3/uL (ref 0.1–1.0)
Monocytes Relative: 8 %
Neutro Abs: 4.8 10*3/uL (ref 1.7–7.7)
Neutrophils Relative %: 53 %
Platelet Count: 288 10*3/uL (ref 150–400)
RBC: 4.37 MIL/uL (ref 3.87–5.11)
RDW: 13.2 % (ref 11.5–15.5)
WBC Count: 8.9 10*3/uL (ref 4.0–10.5)
nRBC: 0 % (ref 0.0–0.2)

## 2022-06-27 LAB — CMP (CANCER CENTER ONLY)
ALT: 13 U/L (ref 0–44)
AST: 18 U/L (ref 15–41)
Albumin: 4.4 g/dL (ref 3.5–5.0)
Alkaline Phosphatase: 67 U/L (ref 38–126)
Anion gap: 5 (ref 5–15)
BUN: 16 mg/dL (ref 8–23)
CO2: 30 mmol/L (ref 22–32)
Calcium: 10.6 mg/dL — ABNORMAL HIGH (ref 8.9–10.3)
Chloride: 104 mmol/L (ref 98–111)
Creatinine: 0.77 mg/dL (ref 0.44–1.00)
GFR, Estimated: >60 mL/min (ref >60)
Glucose, Bld: 106 mg/dL — ABNORMAL HIGH (ref 70–99)
Potassium: 4 mmol/L (ref 3.5–5.1)
Sodium: 139 mmol/L (ref 135–145)
Total Bilirubin: 0.6 mg/dL (ref 0.3–1.2)
Total Protein: 7.6 g/dL (ref 6.5–8.1)

## 2022-06-27 LAB — TECHNOLOGIST SMEAR REVIEW: Plt Morphology: NORMAL

## 2022-06-28 LAB — ANGIOTENSIN CONVERTING ENZYME: Angiotensin-Converting Enzyme: 46 U/L (ref 14–82)

## 2022-06-28 LAB — TISSUE TRANSGLUTAMINASE, IGG: Tissue Transglut Ab: 5 U/mL (ref 0–5)

## 2022-06-28 LAB — TISSUE TRANSGLUTAMINASE, IGA: Tissue Transglutaminase Ab, IgA: <2 U/mL (ref 0–3)

## 2022-06-28 NOTE — Telephone Encounter (Signed)
Prolia VOB initiated via [email or web address] 

## 2022-06-29 LAB — MISC LABCORP TEST (SEND OUT): Labcorp test code: 164640

## 2022-06-29 LAB — CYCLIC CITRUL PEPTIDE ANTIBODY, IGG/IGA: CCP Antibodies IgG/IgA: 4 units (ref 0–19)

## 2022-06-29 LAB — RHEUMATOID FACTOR: Rheumatoid fact SerPl-aCnc: 11.9 IU/mL (ref <14.0)

## 2022-06-30 ENCOUNTER — Telehealth: Payer: Self-pay | Admitting: Oncology

## 2022-06-30 LAB — IGG 4: IgG, Subclass 4: 8 mg/dL (ref 2–96)

## 2022-06-30 NOTE — Telephone Encounter (Signed)
Scheduled appt per 9/8 los. Pt is aware.

## 2022-07-01 ENCOUNTER — Ambulatory Visit: Payer: Medicare HMO | Admitting: Family Medicine

## 2022-07-01 DIAGNOSIS — M170 Bilateral primary osteoarthritis of knee: Secondary | ICD-10-CM

## 2022-07-01 LAB — PARASITE EXAM, BLOOD

## 2022-07-01 NOTE — Patient Instructions (Signed)
So glad you're doing so well 53m Vit C with iron containing meal See me when you need me

## 2022-07-01 NOTE — Assessment & Plan Note (Signed)
Patient is doing extremely well at this moment.  No change in management.  With patient doing so well can follow-up with me more on an as-needed basis.

## 2022-07-02 LAB — BCR ABL1 FISH (GENPATH)

## 2022-07-02 LAB — JAK2 (INCLUDING V617F AND EXON 12), MPL,& CALR W/RFL MPN PANEL (NGS)

## 2022-07-07 ENCOUNTER — Ambulatory Visit: Payer: Medicare HMO | Admitting: Allergy

## 2022-07-08 LAB — MISC LABCORP TEST (SEND OUT): Labcorp test code: 830801

## 2022-07-08 NOTE — Telephone Encounter (Signed)
Appt 07/30/22

## 2022-07-08 NOTE — Telephone Encounter (Signed)
Pt ready for scheduling on or after 07/30/22   Out-of-pocket cost due at time of visit: $301   Primary: [employer] Prolia co-insurance: 20% (approximately $276) Admin fee co-insurance: 20% (approximately $25)   Secondary: n/a Prolia co-insurance:  Admin fee co-insurance:    Deductible: does not apply   Prior Auth: APPROVED PA# [id number] Valid: 10/20/21-10/19/22   ** This summary of benefits is an estimation of the patient's out-of-pocket cost. Exact cost may vary based on individual plan coverage.

## 2022-07-17 ENCOUNTER — Ambulatory Visit (HOSPITAL_COMMUNITY)
Admission: RE | Admit: 2022-07-17 | Discharge: 2022-07-17 | Disposition: A | Discharge status: Home / Self Care | Payer: Medicare HMO | Source: Ambulatory Visit | Attending: Internal Medicine | Admitting: Internal Medicine

## 2022-07-17 DIAGNOSIS — F17210 Nicotine dependence, cigarettes, uncomplicated: Secondary | ICD-10-CM | POA: Diagnosis not present

## 2022-07-17 DIAGNOSIS — Z87891 Personal history of nicotine dependence: Secondary | ICD-10-CM | POA: Insufficient documentation

## 2022-07-21 ENCOUNTER — Other Ambulatory Visit: Payer: Self-pay

## 2022-07-21 ENCOUNTER — Encounter: Payer: Self-pay | Admitting: Internal Medicine

## 2022-07-21 DIAGNOSIS — Z122 Encounter for screening for malignant neoplasm of respiratory organs: Secondary | ICD-10-CM

## 2022-07-21 DIAGNOSIS — Z87891 Personal history of nicotine dependence: Secondary | ICD-10-CM

## 2022-07-21 DIAGNOSIS — F17210 Nicotine dependence, cigarettes, uncomplicated: Secondary | ICD-10-CM

## 2022-07-22 LAB — HISTOPLASMA ANTIGEN, URINE: Histoplasma Antigen, urine: <0.5 (ref <0.5)

## 2022-07-22 NOTE — Progress Notes (Signed)
[facility] Follow up Visit:  Patient Care Team: Biagio Borg, MD as PCP - General (Internal Medicine) Leonie Man, MD as PCP - Cardiology (Cardiology)  CHIEF COMPLAINTS/PURPOSE OF CONSULTATION:  Oncology History   No history exists.    HISTORY OF PRESENTING ILLNESS: Crystal [name] 71 y.o. female is here because of eosinophilia.    Medical history notable for allergic rhinitis, tobacco use, OSA on CPAP with good compliance  April 18, 2022: WBC 7.9 hemoglobin 13.1 platelet count 248; 38 segs 26 lymphs 9 monos 27 eos 1 basophil absolute eosinophil count 2.1  May 26, 2022: Allergist evaluation.  Diagnosed with Asthma WBC 8.0 hemoglobin 14.0 platelet count 285; 40 segs 24 lymphs 11 monos 1 base out 24 eos absolute eosinophil count 1.9 Placed on Breztri  and [name]   May 29 2022:  Stool O&P negative.   Serology for Strongyloides ease, HIV were negative. ESR 14, CRP 1  ANA negative ANCA negative. IgG 962 IgA 211 IgM 104 IgE 71 Vitamin B12 1683 tryptase 7.6 CMP notable for sodium 145  June 27 2022: [facility]  Patient reports that eosinophil count was first noted to be elevated in June 2023; she noted increased cough that week which she related to smoke from [place]. July 23rd to 28th 2023 vacationed in [place] and noted increased cough at night.  Saw PCP on August 7th because the cough and sinus drainage had worsened.  Patient has history of COPD but was never diagnosed with asthma until August.  She has not been on any antibiotics.  Respiratory symptoms in form of cough, chest tightness and nasal congestion have improved since starting Breztri and [name].   Only new medications are those scribed by allergist.  Due of Prolia in October 2023.   Last mammogram was October 2022 which was negative She has history of colon polyps.  Last meeting with genetic counselor was 12 yrs ago July 17 2022 due for CT lung  screening.    Social:  Widowed.  Former smoker.  EtOH formerly heavy drinker now down to 1 to 2 nights a week.    Funk:  Brother recently noted to have elevated eosinophil count Both sisters have history of breast cancer Father had colon and bladder cancer WBC 8.9 hemoglobin 13.8 MCV 94 platelet count 288; 53 segs 25 lymphs 8 monos 13 eos absolute eosinophil count 1.1 CMP notable for glucose 106 calcium 10.6 albumin 4.4 ACE 46 CCP for rheumatoid factor 11.9 tissue transglutaminase IgA undetectable Urine histoplasma antigen negative. Trichinella IgG negative Toxocara IgG negative FISH for BCR able negative  Jak 2 panel showed a TET 2 p.CYS 1378Tyr variant of potential clinical significance and a variant of unknown clinical significance CUX1 p.[id number] The following genes are NEGATIVE for clinically relevant mutations. Mutational hotspots and surrounding exonic regions were interrogated for DNA level point mutations and indels (fusions not assayed). ABL1, ANKRD26, ASXL1, ATRX, BCOR, BCORL1, BRAF, CALR, CBL, CCND2, CDKN2A, CEBPA, CSF3R, DDX41, DNMT3A, ETNK1, ETV6, EZH2, FBXW7, FLT3, GATA2, HRAS, IDH1, IDH2, JAK2, KDM6A, KIT, KMT2A, KRAS, MAP2K1, MPL, MYD88, NF1, NPM1, NRAS, PDGFRA, PHF6, PTEN, PTPN11, RUNX1, SETBP1, SF3B1, SRSF2, STAG2, TP53, U2AF1, WT1, ZRSR2  July 25 2022:  Scheduled follow up for management of eosinophilia Reviewed results of labs with patient.  Asthma under good control with Breztri   Review of Systems  Constitutional:  Negative for fatigue, fever and unexpected weight change.       Occasional fleeting chills.  HENT:   Positive for voice change. Negative for mouth sores, nosebleeds, sore throat and trouble swallowing.        Voice change since developing the cough  Eyes:  Negative for eye problems and icterus.  Respiratory:  Negative for hemoptysis, shortness of breath and wheezing.        Chest tightness and cough have improved with medication  Cardiovascular:   Negative for chest pain and leg swelling.  Gastrointestinal:  Negative for abdominal pain, blood in stool, constipation, diarrhea, nausea and vomiting.  Endocrine: Positive for hot flashes.  Genitourinary:  Negative for dysuria, frequency and hematuria.   Musculoskeletal:  Negative for back pain, gait problem and myalgias.       Chronic mild arthralgias  Skin:  Negative for itching and rash.  Neurological:  Negative for dizziness, gait problem, seizures and speech difficulty.  Hematological:  Negative for adenopathy. Bruises/bleeds easily.  Psychiatric/Behavioral:  Negative for depression, sleep disturbance and suicidal ideas.     MEDICAL HISTORY: Past Medical History:  Diagnosis Date   ALLERGIC RHINITIS    Allergy    ANXIETY    no medations needed   Arthritis    Asthma    Bronchitis    Cataract    no surgery   COLONIC POLYPS, HX OF    Complication of anesthesia    oxygen sat dropped during endoscopy    COPD    pt unsure of this dx   Cough    DEPRESSION    Elevated coronary artery calcium score 12/2021   Agatston score 173-all LAD   Fatty liver    GERD    GLUCOSE INTOLERANCE    HYPERTENSION    IBS    Impaired glucose tolerance    Obstructive sleep apnea 06/25/2018   OSTEOPENIA    Osteopenia    Osteoporosis 07/30/2017   PERIPHERAL EDEMA    Pneumonia    Pneumonia, organism unspecified(486)    Sleep apnea    Oral Aparatus   TOBACCO USE DISORDER/SMOKER-SMOKING CESSATION DISCUSSED     SURGICAL HISTORY: Past Surgical History:  Procedure Laterality Date   birthmark removed     from back as a child   bladder mesh     2012   CESAREAN SECTION     x2   CHOLECYSTECTOMY     COLONOSCOPY  2018   TA   MOUTH SURGERY  04/25/2020   tissue graft -room of mouth to front gums   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   Procedure: LEFT TOTAL HIP ARTHROPLASTY ANTERIOR APPROACH;  Surgeon: Melrose Nakayama, MD;  Location: [facility];  Service: Orthopedics;  Laterality: Left;    WISDOM TOOTH EXTRACTION      SOCIAL HISTORY: Social History   Socioeconomic History   Marital status: Widowed    Spouse name: Not on file   Number of children: 2   Years of education: Not on file   Highest education level: Not on file  Occupational History   Occupation: Occupational psychologist industries, signage for [employer]    Comment: Retired  Tobacco Use   Smoking status: Former    Packs/day: 1.00    Years: 42.00    Total pack years: 42.00    Types: Cigarettes    Start date: 10/20/1977    Quit date: 08/01/2021    Years since quitting: 0.9   Smokeless tobacco: Never  Vaping Use   Vaping Use: Never used  Substance and Sexual Activity   Alcohol use: Yes  Alcohol/week: 21.0 standard drinks of alcohol    Types: 7 Glasses of wine, 14 Standard drinks or equivalent per week    Comment: couple of drinks 1-2 nites per week    Drug use: No   Sexual activity: Not on file  Other Topics Concern   Not on file  Social History Narrative   She indicates that she has been most of her life sedentary, but started exercising in the fall of October 2022: Has started doing water aerobics.      She had work up to 4 days a week, but has had to scale down to about 2 days a week because of knee pain.   Social Determinants of Health   Financial Resource Strain: Low Risk  (04/07/2022)   Overall Financial Resource Strain (CARDIA)    Difficulty of Paying Living Expenses: Not hard at all  Food Insecurity: No Food Insecurity (04/07/2022)   Hunger Vital Sign    Worried About Running Out of Food in the Last Year: Never true    Ran Out of Food in the Last Year: Never true  Transportation Needs: No Transportation Needs (04/07/2022)   PRAPARE - Hydrologist (Medical): No    Lack of Transportation (Non-Medical): No  Physical Activity: Sufficiently Active (04/07/2022)   Exercise Vital Sign    Days of Exercise per Week: 3 days    Minutes of Exercise per Session: 50 min   Stress: No Stress Concern Present (04/07/2022)   [place]    Feeling of Stress : Not at all  Social Connections: Socially Isolated (04/07/2022)   Social Connection and Isolation Panel [NHANES]    Frequency of Communication with Friends and Family: Three times a week    Frequency of Social Gatherings with Friends and Family: Three times a week    Attends Religious Services: Never    Active Member of Clubs or Organizations: No    Attends Archivist Meetings: Never    Marital Status: Widowed  Intimate Partner Violence: Not At Risk (04/07/2022)   Humiliation, Afraid, Rape, and Kick questionnaire    Fear of Current or Ex-Partner: No    Emotionally Abused: No    Physically Abused: No    Sexually Abused: No    FAMILY HISTORY Family History  Problem Relation Age of Onset   Breast cancer Sister 49       +lump and tamoxifen; eventually had bilateral mastectomies   Allergic rhinitis Sister    Colon cancer Father        dx. >79   Bladder Cancer Father        dx. 4-79; not a smoker   Skin cancer Father        basal cell carcinoma   Colon polyps Father    Colon cancer Paternal Grandfather        unspecified age   Melanoma Paternal Grandfather        unspecified age   Skin cancer Mother        NOS; dx. 41s   Other Mother        hx of hysterectomy in her early 25s for fibroid cysts   Heart attack Maternal Uncle 67   Heart attack Maternal Grandmother 62   Thyroid cancer Paternal Grandmother 88   Breast cancer Sister        dx. early 77s; s/p BL mastectomies   Cervical cancer Sister  dx. late 20s   Skin cancer Brother        NOS   Anxiety disorder Other    Hypothyroidism Other    Hyperlipidemia Other    Diabetes Other    Esophageal cancer Neg Hx    Stomach cancer Neg Hx    Rectal cancer Neg Hx     ALLERGIES:  is allergic to ace inhibitors and penicillins.  MEDICATIONS:  Current Outpatient  Medications  Medication Sig Dispense Refill   albuterol (VENTOLIN HFA) 108 (90 Base) MCG/ACT inhaler Inhale 1-2 puffs into the lungs every 4 (four) hours as needed for wheezing or shortness of breath (coughing). 18 g 5   aspirin 81 MG EC tablet Take 1 tablet (81 mg total) by mouth daily. Swallow whole. 30 tablet 12   atorvastatin (LIPITOR) 40 MG tablet Take 1 tablet (40 mg total) by mouth daily. 90 tablet 3   Budeson-Glycopyrrol-Formoterol (BREZTRI AEROSPHERE) 160-9-4.8 MCG/ACT AERO Inhale 2 puffs into the lungs in the morning and at bedtime. with spacer and rinse mouth afterwards. 10.7 g 2   Calcium Carb-Cholecalciferol 600-800 MG-UNIT TABS Take 1 tablet by mouth in the morning and at bedtime.      Cholecalciferol (VITAMIN D PO) Take 2,000 Units by mouth daily.      denosumab (PROLIA) 60 MG/ML SOSY injection Inject 60 mg into the skin every 6 (six) months.     folic acid (FOLVITE) 742 MCG tablet Take 400 mcg by mouth daily.     losartan (COZAAR) 50 MG tablet TAKE 1 TABLET EVERY DAY (ANNUAL APPOINTMENT DUE IN MAY, MUST SEE MD FOR FUTURE REFILLS) 90 tablet 3   metoprolol succinate (TOPROL-XL) 25 MG 24 hr tablet TAKE 1 TABLET BY MOUTH EVERY DAY 90 tablet 3   MYRBETRIQ 50 MG TB24 tablet Take 50 mg by mouth daily.     Olopatadine-Mometasone ([name]) [id number] MCG/ACT SUSP Place 1-2 sprays into the nose in the morning and at bedtime. 29 g 5   vitamin B-12 (CYANOCOBALAMIN) 1000 MCG tablet Take 1 tablet (1,000 mcg total) by mouth daily. (Patient not taking: Reported on 06/27/2022) 90 tablet 3   No current facility-administered medications for this visit.    PHYSICAL EXAMINATION:  ECOG PERFORMANCE STATUS: 0 - Asymptomatic   There were no vitals filed for this visit.  There were no vitals filed for this visit.   Physical Exam Vitals and nursing note reviewed.  Constitutional:      General: She is not in acute distress.    Appearance: Normal appearance. She is obese. She is not ill-appearing,  toxic-appearing or diaphoretic.  HENT:     Head: Normocephalic and atraumatic.     Right Ear: External ear normal.     Left Ear: External ear normal.     Nose: Nose normal. No congestion or rhinorrhea.     Mouth/Throat:     Mouth: Mucous membranes are moist.     Pharynx: Oropharynx is clear. No oropharyngeal exudate or posterior oropharyngeal erythema.  Eyes:     General: No scleral icterus.    Extraocular Movements: Extraocular movements intact.     Conjunctiva/sclera: Conjunctivae normal.     Pupils: Pupils are equal, round, and reactive to light.  Cardiovascular:     Rate and Rhythm: Normal rate and regular rhythm.     Heart sounds: No murmur heard.    No friction rub. No gallop.  Pulmonary:     Effort: Pulmonary effort is normal. No respiratory distress.     Breath  sounds: Normal breath sounds. No stridor. No wheezing, rhonchi or rales.  Chest:     Chest wall: No tenderness.  Abdominal:     General: Bowel sounds are normal. There is no distension.     Palpations: Abdomen is soft. There is no mass.     Tenderness: There is no abdominal tenderness. There is no guarding or rebound.     Hernia: No hernia is present.  Musculoskeletal:        General: No swelling, tenderness or deformity.     Cervical back: Normal range of motion and neck supple. No rigidity or tenderness.  Lymphadenopathy:     Head:     Right side of head: No submental, submandibular, tonsillar, preauricular, posterior auricular or occipital adenopathy.     Left side of head: No submental, submandibular, tonsillar, preauricular, posterior auricular or occipital adenopathy.     Cervical: No cervical adenopathy.     Right cervical: No superficial, deep or posterior cervical adenopathy.    Left cervical: No superficial, deep or posterior cervical adenopathy.     Upper Body:     Right upper body: No supraclavicular, axillary, pectoral or epitrochlear adenopathy.     Left upper body: No supraclavicular, axillary,  pectoral or epitrochlear adenopathy.  Skin:    General: Skin is warm.     Coloration: Skin is not jaundiced.  Neurological:     General: No focal deficit present.     Mental Status: She is alert and oriented to person, place, and time. Mental status is at baseline.     Cranial Nerves: No cranial nerve deficit.     Motor: No weakness.     Gait: Gait normal.  Psychiatric:        Mood and Affect: Mood normal.        Behavior: Behavior normal.        Thought Content: Thought content normal.        Judgment: Judgment normal.      LABORATORY DATA: I have personally reviewed the data as listed:  Appointment on 06/27/2022  Component Date Value Ref Range Status   BCR ABL1 / ABL1 06/27/2022 See Scanned report in [place]   Final   Performed at [facility], [place] [address] [place], [place] [address]   Angiotensin-Converting Enzyme 06/27/2022 46  14 - 82 U/L Final   Comment: (NOTE) Performed At: [facility] [place], [place] [id number] Rush Farmer MD DQ:[id number]    WBC Count 06/27/2022 8.9  4.0 - 10.5 K/uL Final   RBC 06/27/2022 4.37  3.87 - 5.11 MIL/uL Final   Hemoglobin 06/27/2022 13.8  12.0 - 15.0 g/dL Final   HCT 06/27/2022 40.9  36.0 - 46.0 % Final   MCV 06/27/2022 93.6  80.0 - 100.0 fL Final   MCH 06/27/2022 31.6  26.0 - 34.0 pg Final   MCHC 06/27/2022 33.7  30.0 - 36.0 g/dL Final   RDW 06/27/2022 13.2  11.5 - 15.5 % Final   Platelet Count 06/27/2022 288  150 - 400 K/uL Final   nRBC 06/27/2022 0.0  0.0 - 0.2 % Final   Neutrophils Relative % 06/27/2022 53  % Final   Neutro Abs 06/27/2022 4.8  1.7 - 7.7 K/uL Final   Lymphocytes Relative 06/27/2022 25  % Final   Lymphs Abs 06/27/2022 2.2  0.7 - 4.0 K/uL Final   Monocytes Relative 06/27/2022 8  % Final   Monocytes Absolute 06/27/2022 0.7  0.1 -  1.0 K/uL Final   Eosinophils Relative 06/27/2022 13  % Final   Eosinophils Absolute 06/27/2022 1.1 (H)  0.0 - 0.5 K/uL Final    Basophils Relative 06/27/2022 1  % Final   Basophils Absolute 06/27/2022 0.1  0.0 - 0.1 K/uL Final   Immature Granulocytes 06/27/2022 0  % Final   Abs Immature Granulocytes 06/27/2022 0.02  0.00 - 0.07 K/uL Final   Performed at [facility], [place] [address], [place], [place] [address]   Sodium 06/27/2022 139  135 - 145 mmol/L Final   Potassium 06/27/2022 4.0  3.5 - 5.1 mmol/L Final   Chloride 06/27/2022 104  98 - 111 mmol/L Final   CO2 06/27/2022 30  22 - 32 mmol/L Final   Glucose, Bld 06/27/2022 106 (H)  70 - 99 mg/dL Final   Glucose reference range applies only to samples taken after fasting for at least 8 hours.   BUN 06/27/2022 16  8 - 23 mg/dL Final   Creatinine 06/27/2022 0.77  0.44 - 1.00 mg/dL Final   Calcium 06/27/2022 10.6 (H)  8.9 - 10.3 mg/dL Final   Total Protein 06/27/2022 7.6  6.5 - 8.1 g/dL Final   Albumin 06/27/2022 4.4  3.5 - 5.0 g/dL Final   AST 06/27/2022 18  15 - 41 U/L Final   ALT 06/27/2022 13  0 - 44 U/L Final   Alkaline Phosphatase 06/27/2022 67  38 - 126 U/L Final   Total Bilirubin 06/27/2022 0.6  0.3 - 1.2 mg/dL Final   GFR, Estimated 06/27/2022 >60  >60 mL/min Final   Comment: (NOTE) Calculated using the CKD-EPI Creatinine Equation (2021)    Anion gap 06/27/2022 5  5 - 15 Final   Performed at [facility], [place] [address], [place], [place] [address]   Histoplasma Antigen, urine 06/27/2022 <0.5  <0.5 ng/mL Final   Comment: (NOTE) This test was developed and its performance characteristics determined by [facility]. It has not been cleared or approved by the Food and Drug Administration. Performed At: [facility] [place], [place] [id number] Rush Farmer MD WG:[id number]    Disclaimer: 06/27/2022 PENDING   Incomplete   Rhuematoid fact SerPl-aCnc 06/27/2022 11.9  <14.0 IU/mL Final   Comment: (NOTE) Performed At: [facility] [place], [place]  [id number] Rush Farmer MD MW:[id number]    Parasite Exam, Blood 06/27/2022 Comment  None Seen Final   Comment: (NOTE) No Plasmodium, Babesia, or other blood parasites seen. One negative result does not rule out the possibility of a parasitic infestation. If protozoal, filarial, or trypanosomal infection is strongly suspected, test should be performed at least three times with samples obtained at different times in the fever cycle. Performed At: [facility] [place], [place] [id number] Rush Farmer MD [id number]    IgG, Subclass 4 06/27/2022 8  2 - 96 mg/dL Final   Comment: (NOTE) Performed At: [facility] [place], [place] [id number] Rush Farmer MD OA:[id number]    JAK 2, MPL, Glendell Docker, MPN 06/27/2022 See Scanned report in [place]   Final   Performed at [facility], [place] [address] [place], [place] [address]  Office Visit on 06/27/2022  Component Date Value Ref Range Status   WBC MORPHOLOGY 06/27/2022 MORPHOLOGY UNREMARKABLE   Final   RBC MORPHOLOGY 06/27/2022 MORPHOLOGY UNREMARKABLE   Final   Plt Morphology 06/27/2022 Normal platelet morphology   Final   Clinical Information 06/27/2022  Eosinophilia   Final   Performed at [facility], [place] [address] [place], [place] [address]   CCP Antibodies IgG/IgA 06/27/2022 4  0 - 19 units Final   Comment: (NOTE)                          Negative               <20                          Weak positive      20 - 39                          Moderate positive  40 - 59                          Strong positive        >59 Performed At: [facility] [place], [place] [id number] Rush Farmer MD [id number]    Tissue Transglutaminase Ab, IgA 06/27/2022 <2  0 - 3 U/mL Final   Comment: (NOTE)                              Negative        0 -  3                              Weak Positive   4 - 10                               Positive           >10 Tissue Transglutaminase (tTG) has been identified as the endomysial antigen.  Studies have demonstr- ated that endomysial IgA antibodies have over 99% specificity for gluten sensitive enteropathy. Performed At: [facility] [place], [place] [id number] Rush Farmer MD HL:[id number]    Tissue Transglut Ab 06/27/2022 5  0 - 5 U/mL Final   Comment: (NOTE)                              Negative        0 - 5                              Weak Positive   6 - 9                              Positive           >9 Performed At: [facility] [place], [place] [id number] Rush Farmer MD LX:[id number]    [place] test code 06/27/2022 [id number]   Final   [facility]   [facility] test name 06/27/2022 [facility] TRICHINELLA IGG ANTIBODY   Final   Performed at [facility], [place] [address] [place], [place] [address]   Misc [facility] result 06/27/2022 COMMENT   Final   Comment: (NOTE) Performed At: [facility] [place], [place] [id number] Rush Farmer MD NO:[id number]  [facility] test code 06/27/2022 [id number]   Final   Comment: [facility] Performed at [facility], [place] [address], [place], [place] [address]    [facility] test name 06/27/2022 [facility] TOXOCARA IGG AB   Final   Performed at [facility], [place] [address] [place], [place] [address]   Misc [facility] result 06/27/2022 COMMENT   Final   Comment: See Scanned report in [place] (NOTE) Performed At: [facility] [place], [place] [id number] Rush Farmer MD TG:[id number] Performed at [facility], [place] [address], [place], [place] [address]    [place] test code 06/27/2022 [id number]   Final   [facility] test name 06/27/2022 Coccidioides immitis Antibodies   Final   Performed at [facility], [place] [address]  [place], [place] [address]   Misc [facility] result 06/27/2022 COMMENT   Final   Comment: (NOTE) Test Ordered: [id number] t-Transglutaminase (tTG) IgA t-Transglutaminase (tTG) IgA   <2               U/mL     BN     Reference Range: 0-3                                                                Negative        0 -  3                              Weak Positive   4 - 10                              Positive           >10 Tissue Transglutaminase (tTG) has been identified as the endomysial antigen.  Studies have demonstr- ated that endomysial IgA antibodies have over 99% specificity for gluten sensitive enteropathy. Performed At: [facility] [address] [place], [place] [id number] Rush Farmer MD WC:[id number]     RADIOGRAPHIC STUDIES: I have personally reviewed the radiological images as listed and agree with the findings in the report  No results found.  ASSESSMENT/PLAN  Eosinophils:  Bone marrow-derived leukocytes whose development and terminal differentiation are under the control of IL-3, GM-CSF, and IL-5 , with IL-5 being the cytokine that is primarily responsible for eosinophilopoiesis.    Eosinophilia:  Defined as > 450 eosinophils/ul as measured by sampling peripheral blood, although eosinophils are predominantly found in peripheral tissues Hypereosinophilia is defined as eosinophil count of at least 1.5 x 10 9th for 6 months.    Possible conditions causing hypereosinophilia in this patient  Infectious Diseases Certain fungal infections (Allergic bronchopulmonary aspergillosis, Coccidiomycosis)  Allergic or Atopic Diseases Drug hypersensitivity or medication-associated eosinophilias Atopic diseases Asthma  Hematologic and Neoplastic Disorders Hypereosinophilic syndromes (HES) including chronic eosinophilic leukemia Lymphomas (particularly Hodgkin's, T- and B-cell lymphomas) Tumor associated Adenocarcinomas Squamous carcinomas Large cell lung  carcinomas Transitional cell carcinoma of the bladder Systemic mastocytosis  Endocrinologic Disorders Hypoadrenalism Other Atheroembolic Disorders   The most likely explanation in this patient is asthma.  Eosinophilia has improved with use of inhaled agents Patient to undergo CT lung screening, mammogram in  the near future so will hold off on CT CAP.  Can consider urine cytology to screen for bladder cancer   Somatic mutations in hematopoietic lineages:  The Jak2 panel demonstrated two variants of potential significance   Mutation in TET2 [phone]):  TET2 is commonly mutated in a range of myeloid disorders including myelodysplastic syndromes (MDS), myeloproliferative neoplasms (MPN), acute myeloid leukemia (AML), and chronic myelomonocytic leukemia (CMML) TET2 mutations are seen in approximately 20-25% of MDS cases. Of note, TET2 mutations may also be seen in the setting of age related clonal hematopoiesis of indeterminate potential (CHIP) . One study found that TET2 mutations in MPN are associated with decreased overall survival and increased risk of leukemic transformation. In cases of confirmed myeloid neoplasms, including MDS, MPN, and AML, the prognostic significance of TET2 mutations remains under investigation.   variant in CUX1 ([id number]):  This variant has not been reported as a somatic variants in tumors (COSMIC). It has been observed as an extremely rare population variant in publicly available databases (gnomAD). Therefore, due to the paucity of functional and clinical evidence, its significance is currently unclear. It exhibits an allele frequency close to 50% raising the possibility of germline origin, and has not been documented in the ClinVar germline public database.  Probable Clonal Hematopoeisis of Indeterminate Potential (CHIP):  A condition of hematological malignancy-associated somatic mutation(s) in the absence of other hematological malignancy diagnostic criteria.  It is due to accumulation of mutations over time.  It is especially common in the elderly (>10% in those > 65).  Also called age-related clonal hematopoiesis [facility]). The minimum variant allele frequency (VAF) for genetic variants in individuals to meet the criteria for CHIP is ?2% .  These are commonly driver mutations in TET2 and DNMT3A genes.    It is associated with an increased risk of progression to a hematological malignancy (0.5% to 1% per year)  This offers a reasonable explanation as to the findings of the the above mentioned findings in the [place] 2 panel in the setting of a reactive eosinophilia   Personal history of colon polyps and family history of malignancy:  Last meeting with genetic counselor was 12 yrs ago.  Consider referring back.      Cancer Staging  No matching staging information was found for the patient.   No problem-specific Assessment & Plan notes found for this encounter.   No orders of the defined types were placed in this encounter.   All questions were answered. The patient knows to call the clinic with any problems, questions or concerns.  This note was electronically signed.    Barbee Cough, MD  07/22/2022 2:09 PM

## 2022-07-22 NOTE — Progress Notes (Unsigned)
Follow Up Note  RE: Crystal [name] MRN: [id number] DOB: 20-Nov-1950 Date of Office Visit: 07/23/2022  Referring provider: Biagio Borg, MD Primary care provider: Biagio Borg, MD  Chief Complaint: No chief complaint on file.  History of Present Illness: I had the pleasure of seeing Crystal [name] for a follow up visit at [facility] on 07/22/2022. She is a 71 y.o. female, who is being followed for COPD with asthma, allergic rhinitis and peripheral eosinophilia. Her previous allergy office visit was on 05/26/2022 with Dr. Maudie Mercury. Today is a regular follow up visit.  COPD with asthma [facility]) Was doing well until June and required oral prednisone and IM steroid. Breathing is not back to baseline. Quit smoking.   Today's spirometry showed 10% improvement in FEV1 post bronchodilator treatment. Clinically feeling improved.  Daily controller medication(s):  Start Breztri 2 puffs twice a day with spacer and rinse mouth afterwards. Samples given.  May use albuterol rescue inhaler 2 puffs every 4 to 6 hours as needed for shortness of breath, chest tightness, coughing, and wheezing. May use albuterol rescue inhaler 2 puffs 5 to 15 minutes prior to strenuous physical activities. Monitor frequency of use.  Get spirometry at next visit.   Other allergic rhinitis Past history - 2019 skin testing was positive to dust mites, grass, trees and borderline to mouse. Interim history - only using meds prn. Continue environmental control measures.  May use over the counter antihistamines such as Zyrtec (cetirizine), Claritin (loratadine), Allegra (fexofenadine), or Xyzal (levocetirizine) daily as needed. Start [name] (olopatadine + mometasone nasal spray combination) 1-2 sprays per nostril twice a day. Sample given. This replaces your other nasal sprays. If this works well for you, then have [facility] ship the medication to your home - prescription already sent in.  Nasal saline spray  (i.e., Simply Saline) or nasal saline lavage (i.e., NeilMed) is recommended as needed and prior to medicated nasal sprays.   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 No prior history. Get repeat CBC diff. If still elevated then will need further work up.   Return in about 6 weeks (around 07/07/2022).   Vitamin B12 level was slightly elevated - if you are taking any over the counter supplements that have vitamin B12 then recommend to stop and we will recheck this level in 1 month.   I would like for you to see a blood specialist (hematologist) for further evaluation.   06/27/2022 heme/onc visit: "Evaluation:  CBC with diff, CMP, JAK 2, PCR for bcr-abl, PDGFRA, PDGFRB, FGFR1, RF, CCP,  Serologies for Parasites (Strongyloidies, Toxaocariasis, Trichinosis, Schistosomiasis), Fungus (Aspergillus, Coccidiodomycosis), celiac disease.   Consider bone marrow bx/aspirate to evaluate for myeloid and lymphoid abnormalities to include T-cell gene rearrangements.  Allergen-specific IgE tests.  Celiac disease.  Skin scrapings for scabies.     Patient to undergo CT lung screening, mammogram in the near future so will hold off on CT CAP.  Can consider urine cytology to screen for bladder cancer    Personal history of colon polyps and family history of malignancy:  Last meeting with genetic counselor was 12 yrs ago.  Consider referring back.  "  Assessment and Plan: Crystal [name] is a 71 y.o. female with: No problem-specific Assessment & Plan notes found for this encounter.  No follow-ups on file.  No orders of the defined types were placed in this encounter.  Lab Orders  No laboratory test(s) ordered today    Diagnostics: Spirometry:  Tracings  reviewed. Her effort: {Blank single:19197::"Good reproducible efforts.","It was hard to get consistent efforts and there is a question as to whether this reflects a maximal maneuver.","Poor effort, data can not be interpreted."} FVC: ***L FEV1: ***L,  ***% predicted FEV1/FVC ratio: ***% Interpretation: {Blank single:19197::"Spirometry consistent with mild obstructive disease","Spirometry consistent with moderate obstructive disease","Spirometry consistent with severe obstructive disease","Spirometry consistent with possible restrictive disease","Spirometry consistent with mixed obstructive and restrictive disease","Spirometry uninterpretable due to technique","Spirometry consistent with normal pattern","No overt abnormalities noted given today's efforts"}.  Please see scanned spirometry results for details.  Skin Testing: {Blank single:19197::"Select foods","Environmental allergy panel","Environmental allergy panel and select foods","Food allergy panel","None","Deferred due to recent antihistamines use"}. *** Results discussed with patient/family.   Medication List:  Current Outpatient Medications  Medication Sig Dispense Refill   albuterol (VENTOLIN HFA) 108 (90 Base) MCG/ACT inhaler Inhale 1-2 puffs into the lungs every 4 (four) hours as needed for wheezing or shortness of breath (coughing). 18 g 5   aspirin 81 MG EC tablet Take 1 tablet (81 mg total) by mouth daily. Swallow whole. 30 tablet 12   atorvastatin (LIPITOR) 40 MG tablet Take 1 tablet (40 mg total) by mouth daily. 90 tablet 3   Budeson-Glycopyrrol-Formoterol (BREZTRI AEROSPHERE) 160-9-4.8 MCG/ACT AERO Inhale 2 puffs into the lungs in the morning and at bedtime. with spacer and rinse mouth afterwards. 10.7 g 2   Calcium Carb-Cholecalciferol 600-800 MG-UNIT TABS Take 1 tablet by mouth in the morning and at bedtime.      Cholecalciferol (VITAMIN D PO) Take 2,000 Units by mouth daily.      denosumab (PROLIA) 60 MG/ML SOSY injection Inject 60 mg into the skin every 6 (six) months.     folic acid (FOLVITE) 325 MCG tablet Take 400 mcg by mouth daily.     losartan (COZAAR) 50 MG tablet TAKE 1 TABLET EVERY DAY (ANNUAL APPOINTMENT DUE IN MAY, MUST SEE MD FOR FUTURE REFILLS) 90 tablet 3    metoprolol succinate (TOPROL-XL) 25 MG 24 hr tablet TAKE 1 TABLET BY MOUTH EVERY DAY 90 tablet 3   MYRBETRIQ 50 MG TB24 tablet Take 50 mg by mouth daily.     Olopatadine-Mometasone ([name]) [id number] MCG/ACT SUSP Place 1-2 sprays into the nose in the morning and at bedtime. 29 g 5   vitamin B-12 (CYANOCOBALAMIN) 1000 MCG tablet Take 1 tablet (1,000 mcg total) by mouth daily. (Patient not taking: Reported on 06/27/2022) 90 tablet 3   No current facility-administered medications for this visit.   Allergies: Allergies  Allergen Reactions   Ace Inhibitors Cough   Penicillins Rash   I reviewed her past medical history, social history, family history, and environmental history and no significant changes have been reported from her previous visit.  Review of Systems  Constitutional:  Negative for appetite change, chills, fever and unexpected weight change.  HENT:  Negative for congestion and rhinorrhea.   Eyes:  Negative for itching.  Respiratory:  Positive for cough, chest tightness, shortness of breath and wheezing.   Gastrointestinal:  Negative for abdominal pain.  Skin:  Negative for rash.  Allergic/Immunologic: Positive for environmental allergies.  Neurological:  Negative for headaches.    Objective: There were no vitals taken for this visit. There is no height or weight on file to calculate BMI. Physical Exam Vitals and nursing note reviewed.  Constitutional:      Appearance: Normal appearance. She is well-developed.  HENT:     Head: Normocephalic and atraumatic.     Right Ear: External ear normal.  Left Ear: External ear normal.     Nose: Nose normal.     Mouth/Throat:     Mouth: Mucous membranes are moist.     Pharynx: Oropharynx is clear.  Eyes:     Conjunctiva/sclera: Conjunctivae normal.  Cardiovascular:     Rate and Rhythm: Normal rate and regular rhythm.     Heart sounds: Normal heart sounds. No murmur heard. Pulmonary:     Effort: Pulmonary effort is normal.      Breath sounds: Normal breath sounds. No wheezing, rhonchi or rales.  Musculoskeletal:     Cervical back: Neck supple.  Skin:    General: Skin is warm.     Findings: No rash.  Neurological:     Mental Status: She is alert and oriented to person, place, and time.  Psychiatric:        Behavior: Behavior normal.    Previous notes and tests were reviewed. The plan was reviewed with the patient/family, and all questions/concerned were addressed.  It was my pleasure to see Crystal [name] today and participate in her care. Please feel free to contact me with any questions or concerns.  Sincerely,  Rexene Alberts, DO Allergy & Immunology  [facility] office: [place] office: [phone]

## 2022-07-23 ENCOUNTER — Encounter: Payer: Self-pay | Admitting: Allergy

## 2022-07-23 ENCOUNTER — Ambulatory Visit: Payer: Medicare HMO | Admitting: Allergy

## 2022-07-23 VITALS — BP 146/88 | HR 78 | Temp 97.4°F | Resp 16 | Ht 63.0 in | Wt 197.8 lb

## 2022-07-23 DIAGNOSIS — D7219 Other eosinophilia: Secondary | ICD-10-CM | POA: Diagnosis not present

## 2022-07-23 DIAGNOSIS — J302 Other seasonal allergic rhinitis: Secondary | ICD-10-CM

## 2022-07-23 DIAGNOSIS — J4489 Other specified chronic obstructive pulmonary disease: Secondary | ICD-10-CM

## 2022-07-23 DIAGNOSIS — J3089 Other allergic rhinitis: Secondary | ICD-10-CM | POA: Diagnosis not present

## 2022-07-23 DIAGNOSIS — R0789 Other chest pain: Secondary | ICD-10-CM

## 2022-07-23 DIAGNOSIS — D721 Eosinophilia, unspecified: Secondary | ICD-10-CM | POA: Diagnosis not present

## 2022-07-23 LAB — MISC LABCORP TEST (SEND OUT): Labcorp test code: 3000005

## 2022-07-23 NOTE — Assessment & Plan Note (Signed)
Past history - 04/18/2022 eos elevated at 2100. Interim history - saw heme/onc and had some bloodwork done.  . Follow up with heme/onc as scheduled.

## 2022-07-23 NOTE — Patient Instructions (Addendum)
Breathing: Daily controller medication(s):  Continue Breztri 2 puffs 1-2 times a day with spacer and rinse mouth afterwards. May use albuterol rescue inhaler 2 puffs every 4 to 6 hours as needed for shortness of breath, chest tightness, coughing, and wheezing. May use albuterol rescue inhaler 2 puffs 5 to 15 minutes prior to strenuous physical activities. Monitor frequency of use.  Breathing ontrol goals:  Full participation in all desired activities (may need albuterol before activity) Albuterol use two times or less a week on average (not counting use with activity) Cough interfering with sleep two times or less a month Oral steroids no more than once a year No hospitalizations  Eosinophils Follow up with heme/onc as scheduled.  Allergic rhinitis: 2019 skin testing was positive to dust mites, grass, trees and borderline to mouse. Continue environmental control measures.  May use over the counter antihistamines such as Zyrtec (cetirizine), Claritin (loratadine), Allegra (fexofenadine), or Xyzal (levocetirizine) daily as needed. May use [name] (olopatadine + mometasone nasal spray combination) 1-2 sprays per nostril twice a day as needed. Nasal saline spray (i.e., Simply Saline) or nasal saline lavage (i.e., NeilMed) is recommended as needed and prior to medicated nasal sprays.  Chest tightness Take albuterol 2 puffs when this happens next - if no improvement then symptoms not from lungs. Follow up with PCP/cardiology as well to rule out heart issues.  Follow up in 3 months or sooner if needed.

## 2022-07-23 NOTE — Assessment & Plan Note (Signed)
Past history - 2019 skin testing was positive to dust mites, grass, trees and borderline to mouse. Interim history - [name] daily used caused too much dryness.   Continue environmental control measures.   May use over the counter antihistamines such as Zyrtec (cetirizine), Claritin (loratadine), Allegra (fexofenadine), or Xyzal (levocetirizine) daily as needed.  May use [name] (olopatadine + mometasone nasal spray combination) 1-2 sprays per nostril twice a day as needed.  Nasal saline spray (i.e., Simply Saline) or nasal saline lavage (i.e., NeilMed) is recommended as needed and prior to medicated nasal sprays.

## 2022-07-23 NOTE — Assessment & Plan Note (Addendum)
Past history - Was doing well until June and required oral prednisone and IM steroid. Breathing is not back to baseline. Quit smoking.  2023 spirometry showed 10% improvement in FEV1 post bronchodilator treatment. Clinically feeling improved.  Interim history - doing much better with Breztri. 2023 CT chest done.  Today's spirometry showed some restriction - improved from previous.  . Daily controller medication(s):  o Continue Breztri 2 puffs 1-2 times a day with spacer and rinse mouth afterwards. . May use albuterol rescue inhaler 2 puffs every 4 to 6 hours as needed for shortness of breath, chest tightness, coughing, and wheezing. May use albuterol rescue inhaler 2 puffs 5 to 15 minutes prior to strenuous physical activities. Monitor frequency of use.  . Get spirometry at next visit.

## 2022-07-23 NOTE — Assessment & Plan Note (Signed)
Noted chest tightness almost daily. Denies reflux. No triggers noted. . Take albuterol 2 puffs when this happens next - if no improvement then most likely not related to her lungs.  . Follow up with PCP/cardiology as well to rule out cardiac issues.

## 2022-07-25 ENCOUNTER — Inpatient Hospital Stay: Payer: Medicare HMO

## 2022-07-25 ENCOUNTER — Inpatient Hospital Stay: Payer: Medicare HMO | Attending: Oncology | Admitting: Oncology

## 2022-07-25 ENCOUNTER — Encounter: Payer: Self-pay | Admitting: Oncology

## 2022-07-25 VITALS — BP 143/76 | HR 84 | Temp 97.7°F | Wt 196.2 lb

## 2022-07-25 DIAGNOSIS — G4733 Obstructive sleep apnea (adult) (pediatric): Secondary | ICD-10-CM | POA: Insufficient documentation

## 2022-07-25 DIAGNOSIS — Z83719 Family history of colon polyps, unspecified: Secondary | ICD-10-CM | POA: Diagnosis not present

## 2022-07-25 DIAGNOSIS — R499 Unspecified voice and resonance disorder: Secondary | ICD-10-CM | POA: Diagnosis not present

## 2022-07-25 DIAGNOSIS — Z8052 Family history of malignant neoplasm of bladder: Secondary | ICD-10-CM | POA: Diagnosis not present

## 2022-07-25 DIAGNOSIS — R0981 Nasal congestion: Secondary | ICD-10-CM | POA: Diagnosis not present

## 2022-07-25 DIAGNOSIS — Z8249 Family history of ischemic heart disease and other diseases of the circulatory system: Secondary | ICD-10-CM | POA: Insufficient documentation

## 2022-07-25 DIAGNOSIS — R0789 Other chest pain: Secondary | ICD-10-CM | POA: Insufficient documentation

## 2022-07-25 DIAGNOSIS — Z8349 Family history of other endocrine, nutritional and metabolic diseases: Secondary | ICD-10-CM | POA: Insufficient documentation

## 2022-07-25 DIAGNOSIS — J4489 Other specified chronic obstructive pulmonary disease: Secondary | ICD-10-CM

## 2022-07-25 DIAGNOSIS — Z884 Allergy status to anesthetic agent status: Secondary | ICD-10-CM | POA: Diagnosis not present

## 2022-07-25 DIAGNOSIS — Z8049 Family history of malignant neoplasm of other genital organs: Secondary | ICD-10-CM | POA: Insufficient documentation

## 2022-07-25 DIAGNOSIS — D721 Eosinophilia, unspecified: Secondary | ICD-10-CM

## 2022-07-25 DIAGNOSIS — Z803 Family history of malignant neoplasm of breast: Secondary | ICD-10-CM | POA: Diagnosis not present

## 2022-07-25 DIAGNOSIS — D7589 Other specified diseases of blood and blood-forming organs: Secondary | ICD-10-CM

## 2022-07-25 DIAGNOSIS — D7219 Other eosinophilia: Secondary | ICD-10-CM

## 2022-07-25 DIAGNOSIS — Z9049 Acquired absence of other specified parts of digestive tract: Secondary | ICD-10-CM | POA: Diagnosis not present

## 2022-07-25 DIAGNOSIS — Z7289 Other problems related to lifestyle: Secondary | ICD-10-CM | POA: Insufficient documentation

## 2022-07-25 DIAGNOSIS — R232 Flushing: Secondary | ICD-10-CM | POA: Diagnosis not present

## 2022-07-25 DIAGNOSIS — R0989 Other specified symptoms and signs involving the circulatory and respiratory systems: Secondary | ICD-10-CM | POA: Diagnosis not present

## 2022-07-25 DIAGNOSIS — Z808 Family history of malignant neoplasm of other organs or systems: Secondary | ICD-10-CM | POA: Insufficient documentation

## 2022-07-25 DIAGNOSIS — Z87891 Personal history of nicotine dependence: Secondary | ICD-10-CM | POA: Insufficient documentation

## 2022-07-25 DIAGNOSIS — Z8719 Personal history of other diseases of the digestive system: Secondary | ICD-10-CM | POA: Diagnosis not present

## 2022-07-25 DIAGNOSIS — Z833 Family history of diabetes mellitus: Secondary | ICD-10-CM | POA: Insufficient documentation

## 2022-07-25 DIAGNOSIS — Z800 Family history of malignant neoplasm of digestive organs: Secondary | ICD-10-CM | POA: Diagnosis not present

## 2022-07-25 DIAGNOSIS — I10 Essential (primary) hypertension: Secondary | ICD-10-CM | POA: Diagnosis not present

## 2022-07-25 DIAGNOSIS — Z79899 Other long term (current) drug therapy: Secondary | ICD-10-CM | POA: Insufficient documentation

## 2022-07-25 DIAGNOSIS — Z880 Allergy status to penicillin: Secondary | ICD-10-CM | POA: Diagnosis not present

## 2022-07-25 DIAGNOSIS — Z818 Family history of other mental and behavioral disorders: Secondary | ICD-10-CM | POA: Insufficient documentation

## 2022-07-25 LAB — CBC WITH DIFFERENTIAL (CANCER CENTER ONLY)
Abs Immature Granulocytes: 0.02 10*3/uL (ref 0.00–0.07)
Basophils Absolute: 0.1 10*3/uL (ref 0.0–0.1)
Basophils Relative: 1 %
Eosinophils Absolute: 0.8 10*3/uL — ABNORMAL HIGH (ref 0.0–0.5)
Eosinophils Relative: 10 %
HCT: 39 % (ref 36.0–46.0)
Hemoglobin: 13.1 g/dL (ref 12.0–15.0)
Immature Granulocytes: 0 %
Lymphocytes Relative: 28 %
Lymphs Abs: 2.3 10*3/uL (ref 0.7–4.0)
MCH: 31.7 pg (ref 26.0–34.0)
MCHC: 33.6 g/dL (ref 30.0–36.0)
MCV: 94.4 fL (ref 80.0–100.0)
Monocytes Absolute: 0.8 10*3/uL (ref 0.1–1.0)
Monocytes Relative: 9 %
Neutro Abs: 4.3 10*3/uL (ref 1.7–7.7)
Neutrophils Relative %: 52 %
Platelet Count: 268 10*3/uL (ref 150–400)
RBC: 4.13 MIL/uL (ref 3.87–5.11)
RDW: 13.2 % (ref 11.5–15.5)
WBC Count: 8.3 10*3/uL (ref 4.0–10.5)
nRBC: 0 % (ref 0.0–0.2)

## 2022-07-25 LAB — CORTISOL: Cortisol, Plasma: 6 ug/dL

## 2022-07-27 LAB — TRYPTASE: Tryptase: 5.9 ug/L (ref 2.2–13.2)

## 2022-07-29 ENCOUNTER — Telehealth: Payer: Self-pay | Admitting: Oncology

## 2022-07-29 LAB — ASPERGILLUS ANTIBODY BY IMMUNODIFF
Aspergillus flavus: NEGATIVE
Aspergillus fumigatus, IgG: NEGATIVE
Aspergillus niger: NEGATIVE

## 2022-07-29 NOTE — Telephone Encounter (Signed)
Scheduled appt per 10/6 los. Pt aware.

## 2022-07-30 ENCOUNTER — Ambulatory Visit (INDEPENDENT_AMBULATORY_CARE_PROVIDER_SITE_OTHER): Payer: Medicare HMO | Admitting: *Deleted

## 2022-07-30 DIAGNOSIS — M810 Age-related osteoporosis without current pathological fracture: Secondary | ICD-10-CM

## 2022-07-30 MED ORDER — DENOSUMAB 60 MG/ML ~~LOC~~ SOSY
60.0000 mg | PREFILLED_SYRINGE | Freq: Once | SUBCUTANEOUS | Status: AC
  Administered 2022-07-30: 60 mg via SUBCUTANEOUS

## 2022-07-30 NOTE — Progress Notes (Cosign Needed Addendum)
Administered prolia 60 mg/ml left arm. Pt tolerated well.

## 2022-08-01 DIAGNOSIS — D7589 Other specified diseases of blood and blood-forming organs: Secondary | ICD-10-CM | POA: Insufficient documentation

## 2022-08-17 NOTE — Telephone Encounter (Signed)
Last Prolia inj 07/30/22 Next Prolia inj due 01/30/23

## 2022-08-20 DIAGNOSIS — Z01419 Encounter for gynecological examination (general) (routine) without abnormal findings: Secondary | ICD-10-CM | POA: Diagnosis not present

## 2022-08-20 DIAGNOSIS — Z6835 Body mass index (BMI) 35.0-35.9, adult: Secondary | ICD-10-CM | POA: Diagnosis not present

## 2022-08-20 DIAGNOSIS — Z0000 Encounter for general adult medical examination without abnormal findings: Secondary | ICD-10-CM | POA: Diagnosis not present

## 2022-08-20 DIAGNOSIS — Z01411 Encounter for gynecological examination (general) (routine) with abnormal findings: Secondary | ICD-10-CM | POA: Diagnosis not present

## 2022-08-20 DIAGNOSIS — Z124 Encounter for screening for malignant neoplasm of cervix: Secondary | ICD-10-CM | POA: Diagnosis not present

## 2022-09-02 DIAGNOSIS — Z1231 Encounter for screening mammogram for malignant neoplasm of breast: Secondary | ICD-10-CM | POA: Diagnosis not present

## 2022-09-02 LAB — HM MAMMOGRAPHY

## 2022-10-01 NOTE — Progress Notes (Signed)
[place] Phone: [phone] Subjective:   Crystal [name], am serving as a scribe for Dr. Hulan Saas.  I'm seeing this patient by the request  of:  Biagio Borg, MD  CC: Right hip pain  KGS:[id number]  Crystal [name] is a 71 y.o. female coming in with complaint of R hip pain. Last seen in sept for B knee pain. Patient states that her pain will move. Pain in groin, thigh, and SI joint. Pain worse with ER and at night. Pain will catch and be sharp but then dissipates. Was walking with a cane last week due to pain.   Has also developed L great toe pain at [facility]. Last week burning pain also woke her up.     Past Medical History:  Diagnosis Date   ALLERGIC RHINITIS    Allergy    ANXIETY    [name] medations needed   Arthritis    Asthma    Bronchitis    Cataract    [name] surgery   COLONIC POLYPS, HX OF    Complication of anesthesia    oxygen sat dropped during endoscopy    COPD    pt unsure of this dx   Cough    DEPRESSION    Elevated coronary artery calcium score 12/2021   Agatston score 173-all LAD   Fatty liver    GERD    GLUCOSE INTOLERANCE    HYPERTENSION    IBS    Impaired glucose tolerance    Obstructive sleep apnea 06/25/2018   OSTEOPENIA    Osteopenia    Osteoporosis 07/30/2017   PERIPHERAL EDEMA    Pneumonia    Pneumonia, organism unspecified(486)    Sleep apnea    Oral Aparatus   TOBACCO USE DISORDER/SMOKER-SMOKING CESSATION DISCUSSED    Past Surgical History:  Procedure Laterality Date   birthmark removed     from back as a child   bladder mesh     2012   CESAREAN SECTION     x2   CHOLECYSTECTOMY     COLONOSCOPY  2018   TA   MOUTH SURGERY  04/25/2020   tissue graft -room of mouth to front gums   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   Procedure: LEFT TOTAL HIP ARTHROPLASTY ANTERIOR APPROACH;  Surgeon: Melrose Nakayama, MD;  Location: [facility];  Service: Orthopedics;   Laterality: Left;   WISDOM TOOTH EXTRACTION     Social History   Socioeconomic History   Marital status: Widowed    Spouse name: Not on file   Number of children: 2   Years of education: Not on file   Highest education level: Not on file  Occupational History   Occupation: Occupational psychologist industries, signage for [employer]    Comment: Retired  Tobacco Use   Smoking status: Former    Packs/day: 1.00    Years: 42.00    Total pack years: 42.00    Types: Cigarettes    Start date: 10/20/1977    Quit date: 08/01/2021    Years since quitting: 1.1   Smokeless tobacco: Never  Vaping Use   Vaping Use: Never used  Substance and Sexual Activity   Alcohol use: Yes    Alcohol/week: 21.0 standard drinks of alcohol    Types: 7 Glasses of wine, 14 Standard drinks or equivalent per week    Comment: couple of drinks 1-2 nites per week    Drug use: [name]  Sexual activity: Not on file  Other Topics Concern   Not on file  Social History Narrative   She indicates that she has been most of her life sedentary, but started exercising in the fall of October 2022: Has started doing water aerobics.      She had work up to 4 days a week, but has had to scale down to about 2 days a week because of knee pain.   Social Determinants of Health   Financial Resource Strain: Low Risk  (04/07/2022)   Overall Financial Resource Strain (CARDIA)    Difficulty of Paying Living Expenses: Not hard at all  Food Insecurity: [name] Food Insecurity (04/07/2022)   Hunger Vital Sign    Worried About Running Out of Food in the Last Year: Never true    Ran Out of Food in the Last Year: Never true  Transportation Needs: [name] Transportation Needs (04/07/2022)   PRAPARE - Hydrologist (Medical): [name]    Lack of Transportation (Non-Medical): [name]  Physical Activity: Sufficiently Active (04/07/2022)   Exercise Vital Sign    Days of Exercise per Week: 3 days    Minutes of Exercise per Session: 50  min  Stress: [name] Stress Concern Present (04/07/2022)   [place]    Feeling of Stress : Not at all  Social Connections: Socially Isolated (04/07/2022)   Social Connection and Isolation Panel [NHANES]    Frequency of Communication with Friends and Family: Three times a week    Frequency of Social Gatherings with Friends and Family: Three times a week    Attends Religious Services: Never    Active Member of Clubs or Organizations: [name]    Attends Archivist Meetings: Never    Marital Status: Widowed   Allergies  Allergen Reactions   Ace Inhibitors Cough   Penicillins Rash   Family History  Problem Relation Age of Onset   Breast cancer Sister 36       +lump and tamoxifen; eventually had bilateral mastectomies   Allergic rhinitis Sister    Colon cancer Father        dx. >11   Bladder Cancer Father        dx. 2-79; not a smoker   Skin cancer Father        basal cell carcinoma   Colon polyps Father    Colon cancer Paternal Grandfather        unspecified age   Melanoma Paternal Grandfather        unspecified age   Skin cancer Mother        NOS; dx. 30s   Other Mother        hx of hysterectomy in her early 53s for fibroid cysts   Heart attack Maternal Uncle 67   Heart attack Maternal Grandmother 62   Thyroid cancer Paternal Grandmother 88   Breast cancer Sister        dx. early 40s; s/p BL mastectomies   Cervical cancer Sister        dx. late 64s   Skin cancer Brother        NOS   Anxiety disorder Other    Hypothyroidism Other    Hyperlipidemia Other    Diabetes Other    Esophageal cancer Neg Hx    Stomach cancer Neg Hx    Rectal cancer Neg Hx     Current Outpatient Medications (Endocrine & Metabolic):  denosumab (PROLIA) 60 MG/ML SOSY injection, Inject 60 mg into the skin every 6 (six) months.  Current Outpatient Medications (Cardiovascular):    atorvastatin (LIPITOR) 40 MG tablet, Take 1  tablet (40 mg total) by mouth daily.   losartan (COZAAR) 50 MG tablet, TAKE 1 TABLET EVERY DAY (ANNUAL APPOINTMENT DUE IN MAY, MUST SEE MD FOR FUTURE REFILLS)   metoprolol succinate (TOPROL-XL) 25 MG 24 hr tablet, TAKE 1 TABLET BY MOUTH EVERY DAY  Current Outpatient Medications (Respiratory):    albuterol (VENTOLIN HFA) 108 (90 Base) MCG/ACT inhaler, Inhale 1-2 puffs into the lungs every 4 (four) hours as needed for wheezing or shortness of breath (coughing).   Budeson-Glycopyrrol-Formoterol (BREZTRI AEROSPHERE) 160-9-4.8 MCG/ACT AERO, Inhale 2 puffs into the lungs in the morning and at bedtime. with spacer and rinse mouth afterwards.   Olopatadine-Mometasone ([name]) 665-25 MCG/ACT SUSP, Place 1-2 sprays into the nose in the morning and at bedtime.  Current Outpatient Medications (Analgesics):    aspirin 81 MG EC tablet, Take 1 tablet (81 mg total) by mouth daily. Swallow whole.  Current Outpatient Medications (Hematological):    folic acid (FOLVITE) 297 MCG tablet, Take 400 mcg by mouth daily.  Current Outpatient Medications (Other):    Calcium Carb-Cholecalciferol 600-800 MG-UNIT TABS, Take 1 tablet by mouth in the morning and at bedtime.    Cholecalciferol (VITAMIN D PO), Take 2,000 Units by mouth daily.    tolterodine (DETROL LA) 4 MG 24 hr capsule, Take 4 mg by mouth daily.   Reviewed prior external information including notes and imaging from  primary care provider As well as notes that were available from care everywhere and other healthcare systems.  Past medical history, social, surgical and family history all reviewed in electronic medical record.  [name] pertanent information unless stated regarding to the chief complaint.   Review of Systems:  [name] headache, visual changes, nausea, vomiting, diarrhea, constipation, dizziness, abdominal pain, skin rash, fevers, chills, night sweats, weight loss, swollen lymph nodes, body aches, joint swelling, chest pain, shortness of breath, mood  changes. POSITIVE muscle aches  Objective  Blood pressure 124/82, pulse 88, height 5' 3"  (1.6 m), SpO2 97 %.   General: [name] apparent distress alert and oriented x3 mood and affect normal, dressed appropriately.  HEENT: Pupils equal, extraocular movements intact  Respiratory: Patient's speak in full sentences and does not appear short of breath  Cardiovascular: [name] lower extremity edema, non tender, [name] erythema  .  Patient does have antalgic gait noted.  Favoring the right hip.  Patient has only 5 degrees of internal range of motion with severe pain with internal range of motion.  Patient has 25 degrees of external range of motion.   Impression and Recommendations:     The above documentation has been reviewed and is accurate and complete Lyndal Pulley, DO

## 2022-10-03 ENCOUNTER — Ambulatory Visit: Payer: Self-pay

## 2022-10-03 ENCOUNTER — Ambulatory Visit: Payer: Medicare HMO | Admitting: Family Medicine

## 2022-10-03 ENCOUNTER — Encounter: Payer: Self-pay | Admitting: Family Medicine

## 2022-10-03 ENCOUNTER — Ambulatory Visit (INDEPENDENT_AMBULATORY_CARE_PROVIDER_SITE_OTHER): Payer: Medicare HMO

## 2022-10-03 VITALS — BP 124/82 | HR 88 | Ht 63.0 in

## 2022-10-03 DIAGNOSIS — M25551 Pain in right hip: Secondary | ICD-10-CM

## 2022-10-03 DIAGNOSIS — M1611 Unilateral primary osteoarthritis, right hip: Secondary | ICD-10-CM | POA: Diagnosis not present

## 2022-10-03 NOTE — Assessment & Plan Note (Signed)
Patient does have severe arthritic changes noted.  This was visualized on the arthritic changes seen on x-ray today.  Patient has a very limited range of motion and quality of lifestyle at the moment.  I do feel that patient did great with her contralateral hip replacement and will refer to orthopedic surgery to discuss further.

## 2022-10-03 NOTE — Patient Instructions (Addendum)
Referral to Dr. Rhona Raider We are here if you have questions

## 2022-10-17 ENCOUNTER — Ambulatory Visit: Payer: Medicare HMO | Admitting: Family Medicine

## 2022-10-22 ENCOUNTER — Encounter: Payer: Self-pay | Admitting: Internal Medicine

## 2022-10-22 ENCOUNTER — Ambulatory Visit (INDEPENDENT_AMBULATORY_CARE_PROVIDER_SITE_OTHER): Payer: Medicare HMO | Admitting: Internal Medicine

## 2022-10-22 VITALS — BP 140/100 | HR 79 | Temp 97.8°F | Ht 63.0 in | Wt 198.0 lb

## 2022-10-22 DIAGNOSIS — M1611 Unilateral primary osteoarthritis, right hip: Secondary | ICD-10-CM | POA: Diagnosis not present

## 2022-10-22 DIAGNOSIS — B009 Herpesviral infection, unspecified: Secondary | ICD-10-CM | POA: Diagnosis not present

## 2022-10-22 MED ORDER — VALACYCLOVIR HCL 1 G PO TABS: 1000.0000 mg | ORAL_TABLET | Freq: Two times a day (BID) | ORAL | 2 refills | Status: DC

## 2022-10-22 NOTE — Progress Notes (Signed)
   Subjective:   Patient ID: Crystal [name], female    DOB: December 01, 1950, 72 y.o.   MRN: [id number]  HPI The patient is a 72 YO female coming in for fever blister. Last one years ago. Started yesterday.  Review of Systems  Constitutional: Negative.   HENT: Negative.    Eyes: Negative.   Respiratory:  Negative for cough, chest tightness and shortness of breath.   Cardiovascular:  Negative for chest pain, palpitations and leg swelling.  Gastrointestinal:  Negative for abdominal distention, abdominal pain, constipation, diarrhea, nausea and vomiting.  Musculoskeletal: Negative.   Skin: Negative.   Neurological: Negative.   Psychiatric/Behavioral: Negative.      Objective:  Physical Exam Constitutional:      Appearance: She is well-developed.  HENT:     Head: Normocephalic and atraumatic.     Ears:     Comments: Small lesion right corner mouth Cardiovascular:     Rate and Rhythm: Normal rate and regular rhythm.  Pulmonary:     Effort: Pulmonary effort is normal. No respiratory distress.     Breath sounds: Normal breath sounds. No wheezing or rales.  Abdominal:     General: Bowel sounds are normal. There is no distension.     Palpations: Abdomen is soft.     Tenderness: There is no abdominal tenderness. There is no rebound.  Musculoskeletal:     Cervical back: Normal range of motion.  Skin:    General: Skin is warm and dry.  Neurological:     Mental Status: She is alert and oriented to person, place, and time.     Coordination: Coordination normal.     Vitals:   10/22/22 1045  BP: (!) 140/100  Pulse: 79  Temp: 97.8 F (36.6 C)  TempSrc: Oral  SpO2: 97%  Weight: 198 lb (89.8 kg)  Height: '5\' 3"'$  (1.6 m)    Assessment & Plan:

## 2022-10-22 NOTE — Patient Instructions (Addendum)
We have sent in the valtrex to take 1 pill twice a day for 10 days with refills in case needed.

## 2022-10-22 NOTE — Assessment & Plan Note (Signed)
Rx valtrex 1000 mg BID for 10 days with refill in case needed. Counseled on expected course.

## 2022-10-26 NOTE — Progress Notes (Unsigned)
Follow Up Note  RE: Crystal [name] MRN: [id number] DOB: Jan 11, 1951 Date of Office Visit: 10/27/2022  Referring provider: Biagio Borg, MD Primary care provider: Biagio Borg, MD  Chief Complaint: No chief complaint on file.  History of Present Illness: I had the pleasure of seeing Crystal [name] for a follow up visit at the [facility] [place] on 10/26/2022. She [name] a 72 y.o. female, who [name] being followed for COPD with asthma, eosinophilia, allergic rhinitis. Her previous allergy office visit was on 07/23/2022 with Dr. Maudie Mercury. Today [name] a regular follow up visit.  COPD with asthma [facility]) Past history - Was doing well until June and required oral prednisone and IM steroid. Breathing [name] not back to baseline. Quit smoking.  2023 spirometry showed 10% improvement in FEV1 post bronchodilator treatment. Clinically feeling improved.  Interim history - doing much better with Breztri. 2023 CT chest done. Today's spirometry showed some restriction - improved from previous.  Daily controller medication(s):  Continue Breztri 2 puffs 1-2 times a day with spacer and rinse mouth afterwards. May use albuterol rescue inhaler 2 puffs every 4 to 6 hours as needed for shortness of breath, chest tightness, coughing, and wheezing. May use albuterol rescue inhaler 2 puffs 5 to 15 minutes prior to strenuous physical activities. Monitor frequency of use.  Get spirometry at next visit.   Chest tightness Noted chest tightness almost daily. Denies reflux. No triggers noted. Take albuterol 2 puffs when this happens next - if no improvement then most likely not related to her lungs.  Follow up with PCP/cardiology as well to rule out cardiac issues.   Eosinophilia Past history - 04/18/2022 eos elevated at 2100. Interim history - saw heme/onc and had some bloodwork done.  Follow up with heme/onc as scheduled.   Seasonal and perennial allergic rhinitis Past history - 2019 skin testing was positive to dust  mites, grass, trees and borderline to mouse. Interim history - [name] daily used caused too much dryness.  Continue environmental control measures.  May use over the counter antihistamines such as Zyrtec (cetirizine), Claritin (loratadine), Allegra (fexofenadine), or Xyzal (levocetirizine) daily as needed. May use [name] (olopatadine + mometasone nasal spray combination) 1-2 sprays per nostril twice a day as needed. Nasal saline spray (i.e., Simply Saline) or nasal saline lavage (i.e., NeilMed) [name] recommended as needed and prior to medicated nasal sprays.   Return in about 3 months (around 10/23/2022).  07/25/2022 heme-onc visit: "ASSESSMENT/PLAN   Eosinophils:  Bone marrow-derived leukocytes whose development and terminal differentiation are under the control of IL-3, GM-CSF, and IL-5 , with IL-5 being the cytokine that [name] primarily responsible for eosinophilopoiesis.     Eosinophilia:  Defined as > 450 eosinophils/ul as measured by sampling peripheral blood, although eosinophils are predominantly found in peripheral tissues Hypereosinophilia [name] defined as eosinophil count of at least 1.5 x 10 9th for 6 months.     Possible conditions causing hypereosinophilia in this patient   Infectious Diseases Certain fungal infections (Allergic bronchopulmonary aspergillosis, Coccidiomycosis)   Allergic or Atopic Diseases Drug hypersensitivity or medication-associated eosinophilias Atopic diseases Asthma   Hematologic and Neoplastic Disorders Hypereosinophilic syndromes (HES) including chronic eosinophilic leukemia Lymphomas (particularly Hodgkin's, T- and B-cell lymphomas) Tumor associated Adenocarcinomas Squamous carcinomas Large cell lung carcinomas Transitional cell carcinoma of the bladder Systemic mastocytosis   Endocrinologic Disorders Hypoadrenalism Other Atheroembolic Disorders     The most likely explanation in this patient [name] asthma.  Eosinophilia has improved with use of  inhaled agents Patient to undergo CT lung screening, mammogram in the near future so will hold off on CT CAP.  Can consider urine cytology to screen for bladder cancer"  Assessment and Plan: Crystal [name] a 72 y.o. female with: No problem-specific Assessment & Plan notes found for this encounter.  No follow-ups on file.  No orders of the defined types were placed in this encounter.  Lab Orders  No laboratory test(s) ordered today    Diagnostics: Spirometry:  Tracings reviewed. Her effort: {Blank single:19197::"Good reproducible efforts.","It was hard to get consistent efforts and there [name] a question as to whether this reflects a maximal maneuver.","Poor effort, data can not be interpreted."} FVC: ***L FEV1: ***L, ***% predicted FEV1/FVC ratio: ***% Interpretation: {Blank single:19197::"Spirometry consistent with mild obstructive disease","Spirometry consistent with moderate obstructive disease","Spirometry consistent with severe obstructive disease","Spirometry consistent with possible restrictive disease","Spirometry consistent with mixed obstructive and restrictive disease","Spirometry uninterpretable due to technique","Spirometry consistent with normal pattern","No overt abnormalities noted given today's efforts"}.  Please see scanned spirometry results for details.  Skin Testing: {Blank single:19197::"Select foods","Environmental allergy panel","Environmental allergy panel and select foods","Food allergy panel","None","Deferred due to recent antihistamines use"}. *** Results discussed with patient/family.   Medication List:  Current Outpatient Medications  Medication Sig Dispense Refill   albuterol (VENTOLIN HFA) 108 (90 Base) MCG/ACT inhaler Inhale 1-2 puffs into the lungs every 4 (four) hours as needed for wheezing or shortness of breath (coughing). 18 g 5   aspirin 81 MG EC tablet Take 1 tablet (81 mg total) by mouth daily. Swallow whole. 30 tablet 12   atorvastatin (LIPITOR) 40  MG tablet Take 1 tablet (40 mg total) by mouth daily. 90 tablet 3   Budeson-Glycopyrrol-Formoterol (BREZTRI AEROSPHERE) 160-9-4.8 MCG/ACT AERO Inhale 2 puffs into the lungs in the morning and at bedtime. with spacer and rinse mouth afterwards. 10.7 g 2   Calcium Carb-Cholecalciferol 600-800 MG-UNIT TABS Take 1 tablet by mouth in the morning and at bedtime.      Cholecalciferol (VITAMIN D PO) Take 2,000 Units by mouth daily.      denosumab (PROLIA) 60 MG/ML SOSY injection Inject 60 mg into the skin every 6 (six) months.     folic acid (FOLVITE) 210 MCG tablet Take 400 mcg by mouth daily.     losartan (COZAAR) 50 MG tablet TAKE 1 TABLET EVERY DAY (ANNUAL APPOINTMENT DUE IN MAY, MUST SEE MD FOR FUTURE REFILLS) 90 tablet 3   metoprolol succinate (TOPROL-XL) 25 MG 24 hr tablet TAKE 1 TABLET BY MOUTH EVERY DAY 90 tablet 3   Olopatadine-Mometasone ([name]) 665-25 MCG/ACT SUSP Place 1-2 sprays into the nose in the morning and at bedtime. 29 g 5   tolterodine (DETROL LA) 4 MG 24 hr capsule Take 4 mg by mouth daily. (Patient not taking: Reported on 10/22/2022)     valACYclovir (VALTREX) 1000 MG tablet Take 1 tablet (1,000 mg total) by mouth 2 (two) times daily. 20 tablet 2   No current facility-administered medications for this visit.   Allergies: Allergies  Allergen Reactions   Ace Inhibitors Cough   Penicillins Rash   I reviewed her past medical history, social history, family history, and environmental history and no significant changes have been reported from her previous visit.  Review of Systems  Constitutional:  Negative for appetite change, chills, fever and unexpected weight change.  HENT:  Negative for congestion and rhinorrhea.   Eyes:  Negative for itching.  Respiratory:  Positive for chest tightness. Negative for cough, shortness of breath and  wheezing.   Gastrointestinal:  Negative for abdominal pain.  Skin:  Negative for rash.  Allergic/Immunologic: Positive for environmental  allergies.  Neurological:  Negative for headaches.    Objective: There were no vitals taken for this visit. There [name] no height or weight on file to calculate BMI. Physical Exam Vitals and nursing note reviewed.  Constitutional:      Appearance: Normal appearance. She [name] well-developed.  HENT:     Head: Normocephalic and atraumatic.     Right Ear: External ear normal.     Left Ear: External ear normal.     Nose: Nose normal.     Mouth/Throat:     Mouth: Mucous membranes are moist.     Pharynx: Oropharynx [name] clear.  Eyes:     Conjunctiva/sclera: Conjunctivae normal.  Cardiovascular:     Rate and Rhythm: Normal rate and regular rhythm.     Heart sounds: Normal heart sounds. No murmur heard. Pulmonary:     Effort: Pulmonary effort [name] normal.     Breath sounds: Normal breath sounds. No wheezing, rhonchi or rales.  Musculoskeletal:     Cervical back: Neck supple.  Skin:    General: Skin [name] warm.     Findings: No rash.  Neurological:     Mental Status: She [name] alert and oriented to person, place, and time.  Psychiatric:        Behavior: Behavior normal.    Previous notes and tests were reviewed. The plan was reviewed with the patient/family, and all questions/concerned were addressed.  It was my pleasure to see Kaaliyah today and participate in her care. Please feel free to contact me with any questions or concerns.  Sincerely,  Rexene Alberts, DO Allergy & Immunology  [facility] office: [place] office: [phone]

## 2022-10-27 ENCOUNTER — Encounter: Payer: Self-pay | Admitting: Allergy

## 2022-10-27 ENCOUNTER — Other Ambulatory Visit: Payer: Self-pay

## 2022-10-27 ENCOUNTER — Ambulatory Visit: Payer: Medicare HMO | Admitting: Allergy

## 2022-10-27 VITALS — BP 136/72 | HR 87 | Temp 98.7°F | Resp 20 | Ht 63.0 in | Wt 199.0 lb

## 2022-10-27 DIAGNOSIS — J3089 Other allergic rhinitis: Secondary | ICD-10-CM

## 2022-10-27 DIAGNOSIS — J4489 Other specified chronic obstructive pulmonary disease: Secondary | ICD-10-CM

## 2022-10-27 DIAGNOSIS — D7219 Other eosinophilia: Secondary | ICD-10-CM

## 2022-10-27 DIAGNOSIS — J449 Chronic obstructive pulmonary disease, unspecified: Secondary | ICD-10-CM

## 2022-10-27 DIAGNOSIS — R0789 Other chest pain: Secondary | ICD-10-CM | POA: Diagnosis not present

## 2022-10-27 DIAGNOSIS — J302 Other seasonal allergic rhinitis: Secondary | ICD-10-CM

## 2022-10-27 MED ORDER — ALBUTEROL SULFATE HFA 108 (90 BASE) MCG/ACT IN AERS: 2.0000 | INHALATION_SPRAY | RESPIRATORY_TRACT | 1 refills | Status: DC | PRN

## 2022-10-27 MED ORDER — BREZTRI AEROSPHERE 160-9-4.8 MCG/ACT IN AERO: 2.0000 | INHALATION_SPRAY | Freq: Two times a day (BID) | RESPIRATORY_TRACT | 5 refills | Status: DC

## 2022-10-27 NOTE — Assessment & Plan Note (Signed)
Past history - 04/18/2022 eos elevated at 2100.  Interim history - last eos were only 800, work up by heme/onc unremarkable.  Follow up with heme/onc as scheduled.

## 2022-10-27 NOTE — Assessment & Plan Note (Signed)
Past history - Was doing well until June and required oral prednisone and IM steroid. Breathing is not back to baseline. Quit smoking.  2023 spirometry showed 10% improvement in FEV1 post bronchodilator treatment. Clinically feeling improved. 2023 CT chest done. Interim history - well controlled with Breztri 2 puffs once a day.  Daily controller medication(s):  Continue Breztri 2 puffs 1-2 times a day with spacer and rinse mouth afterwards. During respiratory infections/flares:  Increase Breztri to 2 puffs twice a day for 1-2 weeks until your breathing symptoms return to baseline.  Pretreat with albuterol 2 puffs. May use albuterol rescue inhaler 2 puffs or nebulizer every 4 to 6 hours as needed for shortness of breath, chest tightness, coughing, and wheezing. May use albuterol rescue inhaler 2 puffs 5 to 15 minutes prior to strenuous physical activities. Monitor frequency of use.  Will get spirometry at next visit instead of today due to COVID-19 pandemic and trying to minimize any type of aerosolizing procedures at this time in the office.

## 2022-10-27 NOTE — Assessment & Plan Note (Signed)
Past history - 2019 skin testing was positive to dust mites, grass, trees and borderline to mouse. [name] daily used caused too much dryness.  Interim history - uses Claritin prn with good benefit.  Continue environmental control measures.  May use over the counter antihistamines such as Zyrtec (cetirizine), Claritin (loratadine), Allegra (fexofenadine), or Xyzal (levocetirizine) daily as needed. Nasal saline spray (i.e., Simply Saline) or nasal saline lavage (i.e., NeilMed) is recommended as needed and prior to medicated nasal sprays.

## 2022-10-27 NOTE — Assessment & Plan Note (Signed)
Past history - Noted chest tightness almost daily. Denies reflux. No triggers noted. Interim history - only lasts a few seconds at a time.  Follow up with PCP/cardiology as well to rule out cardiac issues.

## 2022-10-27 NOTE — Patient Instructions (Signed)
Breathing: Daily controller medication(s):  Continue Breztri 2 puffs 1-2 times a day with spacer and rinse mouth afterwards. During respiratory infections/flares:  Increase Breztri to 2 puffs twice a day for 1-2 weeks until your breathing symptoms return to baseline.  Pretreat with albuterol 2 puffs. May use albuterol rescue inhaler 2 puffs or nebulizer every 4 to 6 hours as needed for shortness of breath, chest tightness, coughing, and wheezing. May use albuterol rescue inhaler 2 puffs 5 to 15 minutes prior to strenuous physical activities. Monitor frequency of use.  Breathing control goals:  Full participation in all desired activities (may need albuterol before activity) Albuterol use two times or less a week on average (not counting use with activity) Cough interfering with sleep two times or less a month Oral steroids no more than once a year No hospitalizations   Eosinophils Follow up with heme/onc as scheduled.  Allergic rhinitis: 2019 skin testing was positive to dust mites, grass, trees and borderline to mouse. Continue environmental control measures.  May use over the counter antihistamines such as Zyrtec (cetirizine), Claritin (loratadine), Allegra (fexofenadine), or Xyzal (levocetirizine) daily as needed. Nasal saline spray (i.e., Simply Saline) or nasal saline lavage (i.e., NeilMed) is recommended as needed and prior to medicated nasal sprays.  Chest tightness Follow up with PCP/cardiology as well to rule out heart issues.  Follow up in 3 months or sooner if needed.

## 2022-10-31 ENCOUNTER — Inpatient Hospital Stay: Payer: Medicare HMO

## 2022-10-31 ENCOUNTER — Inpatient Hospital Stay: Payer: Medicare HMO | Attending: Oncology | Admitting: Oncology

## 2022-10-31 ENCOUNTER — Encounter: Payer: Self-pay | Admitting: Oncology

## 2022-10-31 VITALS — BP 132/68 | HR 80 | Temp 97.3°F | Resp 16 | Wt 198.8 lb

## 2022-10-31 DIAGNOSIS — Z83719 Family history of colon polyps, unspecified: Secondary | ICD-10-CM | POA: Diagnosis not present

## 2022-10-31 DIAGNOSIS — Z8349 Family history of other endocrine, nutritional and metabolic diseases: Secondary | ICD-10-CM | POA: Insufficient documentation

## 2022-10-31 DIAGNOSIS — Z87891 Personal history of nicotine dependence: Secondary | ICD-10-CM | POA: Diagnosis not present

## 2022-10-31 DIAGNOSIS — I10 Essential (primary) hypertension: Secondary | ICD-10-CM | POA: Insufficient documentation

## 2022-10-31 DIAGNOSIS — Z818 Family history of other mental and behavioral disorders: Secondary | ICD-10-CM | POA: Insufficient documentation

## 2022-10-31 DIAGNOSIS — Z7289 Other problems related to lifestyle: Secondary | ICD-10-CM | POA: Insufficient documentation

## 2022-10-31 DIAGNOSIS — D7589 Other specified diseases of blood and blood-forming organs: Secondary | ICD-10-CM | POA: Diagnosis not present

## 2022-10-31 DIAGNOSIS — Z8719 Personal history of other diseases of the digestive system: Secondary | ICD-10-CM | POA: Insufficient documentation

## 2022-10-31 DIAGNOSIS — Z79899 Other long term (current) drug therapy: Secondary | ICD-10-CM | POA: Insufficient documentation

## 2022-10-31 DIAGNOSIS — Z8052 Family history of malignant neoplasm of bladder: Secondary | ICD-10-CM | POA: Diagnosis not present

## 2022-10-31 DIAGNOSIS — Z800 Family history of malignant neoplasm of digestive organs: Secondary | ICD-10-CM | POA: Insufficient documentation

## 2022-10-31 DIAGNOSIS — Z884 Allergy status to anesthetic agent status: Secondary | ICD-10-CM | POA: Insufficient documentation

## 2022-10-31 DIAGNOSIS — Z79624 Long term (current) use of inhibitors of nucleotide synthesis: Secondary | ICD-10-CM | POA: Diagnosis not present

## 2022-10-31 DIAGNOSIS — Z8249 Family history of ischemic heart disease and other diseases of the circulatory system: Secondary | ICD-10-CM | POA: Diagnosis not present

## 2022-10-31 DIAGNOSIS — Z880 Allergy status to penicillin: Secondary | ICD-10-CM | POA: Diagnosis not present

## 2022-10-31 DIAGNOSIS — G4733 Obstructive sleep apnea (adult) (pediatric): Secondary | ICD-10-CM | POA: Insufficient documentation

## 2022-10-31 DIAGNOSIS — D7219 Other eosinophilia: Secondary | ICD-10-CM | POA: Diagnosis not present

## 2022-10-31 DIAGNOSIS — R0981 Nasal congestion: Secondary | ICD-10-CM | POA: Insufficient documentation

## 2022-10-31 DIAGNOSIS — Z833 Family history of diabetes mellitus: Secondary | ICD-10-CM | POA: Diagnosis not present

## 2022-10-31 DIAGNOSIS — Z9049 Acquired absence of other specified parts of digestive tract: Secondary | ICD-10-CM | POA: Insufficient documentation

## 2022-10-31 DIAGNOSIS — Z808 Family history of malignant neoplasm of other organs or systems: Secondary | ICD-10-CM | POA: Insufficient documentation

## 2022-10-31 DIAGNOSIS — R0789 Other chest pain: Secondary | ICD-10-CM | POA: Insufficient documentation

## 2022-10-31 DIAGNOSIS — R499 Unspecified voice and resonance disorder: Secondary | ICD-10-CM | POA: Insufficient documentation

## 2022-10-31 DIAGNOSIS — Z803 Family history of malignant neoplasm of breast: Secondary | ICD-10-CM | POA: Insufficient documentation

## 2022-10-31 DIAGNOSIS — J4489 Other specified chronic obstructive pulmonary disease: Secondary | ICD-10-CM | POA: Insufficient documentation

## 2022-10-31 DIAGNOSIS — D721 Eosinophilia, unspecified: Secondary | ICD-10-CM | POA: Diagnosis not present

## 2022-10-31 DIAGNOSIS — R0989 Other specified symptoms and signs involving the circulatory and respiratory systems: Secondary | ICD-10-CM | POA: Insufficient documentation

## 2022-10-31 LAB — CBC WITH DIFFERENTIAL/PLATELET
Abs Immature Granulocytes: 0.02 10*3/uL (ref 0.00–0.07)
Basophils Absolute: 0.1 10*3/uL (ref 0.0–0.1)
Basophils Relative: 1 %
Eosinophils Absolute: 0.3 10*3/uL (ref 0.0–0.5)
Eosinophils Relative: 5 %
HCT: 41.2 % (ref 36.0–46.0)
Hemoglobin: 13.6 g/dL (ref 12.0–15.0)
Immature Granulocytes: 0 %
Lymphocytes Relative: 33 %
Lymphs Abs: 2.4 10*3/uL (ref 0.7–4.0)
MCH: 31 pg (ref 26.0–34.0)
MCHC: 33 g/dL (ref 30.0–36.0)
MCV: 93.8 fL (ref 80.0–100.0)
Monocytes Absolute: 0.7 10*3/uL (ref 0.1–1.0)
Monocytes Relative: 9 %
Neutro Abs: 3.7 10*3/uL (ref 1.7–7.7)
Neutrophils Relative %: 52 %
Platelets: 269 10*3/uL (ref 150–400)
RBC: 4.39 MIL/uL (ref 3.87–5.11)
RDW: 13.2 % (ref 11.5–15.5)
WBC: 7.2 10*3/uL (ref 4.0–10.5)
nRBC: 0 % (ref 0.0–0.2)

## 2022-10-31 NOTE — Progress Notes (Signed)
[facility] Follow up Visit:  Patient Care Team: Biagio Borg, MD as PCP - General (Internal Medicine) Leonie Man, MD as PCP - Cardiology (Cardiology)  CHIEF COMPLAINTS/PURPOSE OF CONSULTATION:  HISTORY OF PRESENTING ILLNESS: Crystal [name] 72 y.o. female is here because of eosinophilia.    Medical history notable for allergic rhinitis, tobacco use, OSA on CPAP with good compliance  April 18, 2022: WBC 7.9 hemoglobin 13.1 platelet count 248; 38 segs 26 lymphs 9 monos 27 eos 1 basophil absolute eosinophil count 2.1  May 26, 2022: Allergist evaluation.  Diagnosed with Asthma WBC 8.0 hemoglobin 14.0 platelet count 285; 40 segs 24 lymphs 11 monos 1 base out 24 eos absolute eosinophil count 1.9 Placed on Breztri  and [name]   May 29 2022:  Stool O&P negative.   Serology for Strongyloides ease, HIV were negative. ESR 14, CRP 1  ANA negative ANCA negative. IgG 962 IgA 211 IgM 104 IgE 71 Vitamin B12 1683 tryptase 7.6 CMP notable for sodium 145  June 27 2022: [facility] Hematology Consult  Patient reports that eosinophil count was first noted to be elevated in June 2023; she noted increased cough that week which she related to smoke from [place]. July 23rd to 28th 2023 vacationed in [place] and noted increased cough at night.  Saw PCP on August 7th because the cough and sinus drainage had worsened.  Patient has history of COPD but was never diagnosed with asthma until August.  She has not been on any antibiotics.  Respiratory symptoms in form of cough, chest tightness and nasal congestion have improved since starting Breztri and [name].   Only new medications are those scribed by allergist.  Due of Prolia in October 2023.   Last mammogram was October 2022 which was negative She has history of colon polyps.  Last meeting with genetic counselor was 12 yrs ago July 17 2022 due for CT lung screening.    Social:  Widowed.  Former  smoker.  EtOH formerly heavy drinker now down to 1 to 2 nights a week.    [place]:  Brother recently noted to have elevated eosinophil count Both sisters have history of breast cancer Father had colon and bladder cancer WBC 8.9 hemoglobin 13.8 MCV 94 platelet count 288; 53 segs 25 lymphs 8 monos 13 eos absolute eosinophil count 1.1 CMP notable for glucose 106 calcium 10.6 albumin 4.4 ACE 46 CCP for rheumatoid factor 11.9 tissue transglutaminase IgA undetectable Urine histoplasma antigen negative. Trichinella IgG negative Toxocara IgG negative FISH for BCR able negative  Jak 2 panel showed a TET 2 p.CYS [id number] variant of potential clinical significance and a variant of unknown clinical significance CUX1 [id number] The following genes are NEGATIVE for clinically relevant mutations. Mutational hotspots and surrounding exonic regions were interrogated for DNA level point mutations and indels (fusions not assayed). ABL1, ANKRD26, ASXL1, ATRX, BCOR, BCORL1, BRAF, CALR, CBL, CCND2, CDKN2A, CEBPA, CSF3R, DDX41, DNMT3A, ETNK1, ETV6, EZH2, FBXW7, FLT3, GATA2, HRAS, IDH1, IDH2, JAK2, KDM6A, KIT, KMT2A, KRAS, MAP2K1, MPL, MYD88, NF1, NPM1, NRAS, PDGFRA, PHF6, PTEN, PTPN11, RUNX1, SETBP1, SF3B1, SRSF2, STAG2, TP53, U2AF1, WT1, ZRSR2  July 25 2022:   Reviewed results of labs with patient.  Asthma under good control with [facility]  October 31 2022:  Scheduled follow up for management of eosinophilia.  Feels well.  Asthma under good control with Breztri.  Has not required use of rescue inhaler for several months.  In need for replacements of right hip and  left knee.  Left hip was replaced about 2 yrs ago.  Will likely have left knee replaced first so that she has one good leg for transfers during recovery.    WBC 7.2 hemoglobin 13.6 platelet count 269; 52 segs 33 lymphs 9 monos 5 eos 1 basophil  At next visit discuss referral for genetic counselling  Review of Systems  Constitutional:  Negative for  fatigue, fever and unexpected weight change.       Occasional fleeting chills.    HENT:   Positive for voice change. Negative for mouth sores, nosebleeds, sore throat and trouble swallowing.        Voice change since developing the cough  Eyes:  Negative for eye problems and icterus.  Respiratory:  Negative for hemoptysis, shortness of breath and wheezing.        Chest tightness and cough have improved with medication  Cardiovascular:  Negative for chest pain and leg swelling.  Gastrointestinal:  Negative for abdominal pain, blood in stool, constipation, diarrhea, nausea and vomiting.  Endocrine: Positive for hot flashes.  Genitourinary:  Negative for dysuria, frequency and hematuria.   Musculoskeletal:  Negative for back pain, gait problem and myalgias.       Chronic mild arthralgias  Skin:  Negative for itching and rash.  Neurological:  Negative for dizziness, gait problem, seizures and speech difficulty.  Hematological:  Negative for adenopathy. Bruises/bleeds easily.  Psychiatric/Behavioral:  Negative for depression, sleep disturbance and suicidal ideas.     MEDICAL HISTORY: Past Medical History:  Diagnosis Date   ALLERGIC RHINITIS    Allergy    ANXIETY    no medations needed   Arthritis    Asthma    Bronchitis    Cataract    no surgery   COLONIC POLYPS, HX OF    Complication of anesthesia    oxygen sat dropped during endoscopy    COPD    pt unsure of this dx   Cough    DEPRESSION    Elevated coronary artery calcium score 12/2021   Agatston score 173-all LAD   Fatty liver    GERD    GLUCOSE INTOLERANCE    HYPERTENSION    IBS    Impaired glucose tolerance    Obstructive sleep apnea 06/25/2018   OSTEOPENIA    Osteopenia    Osteoporosis 07/30/2017   PERIPHERAL EDEMA    Pneumonia    Pneumonia, organism unspecified(486)    Sleep apnea    Oral Aparatus   TOBACCO USE DISORDER/SMOKER-SMOKING CESSATION DISCUSSED     SURGICAL HISTORY: Past Surgical History:   Procedure Laterality Date   birthmark removed     from back as a child   bladder mesh     2012   CESAREAN SECTION     x2   CHOLECYSTECTOMY     COLONOSCOPY  2018   TA   MOUTH SURGERY  04/25/2020   tissue graft -room of mouth to front gums   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   Procedure: LEFT TOTAL HIP ARTHROPLASTY ANTERIOR APPROACH;  Surgeon: Melrose Nakayama, MD;  Location: [facility];  Service: Orthopedics;  Laterality: Left;   WISDOM TOOTH EXTRACTION      SOCIAL HISTORY: Social History   Socioeconomic History   Marital status: Widowed    Spouse name: Not on file   Number of children: 2   Years of education: Not on file   Highest education level: Not on file  Occupational History   Occupation: Occupational psychologist  industries, Museum/gallery curator for [employer]    Comment: Retired  Tobacco Use   Smoking status: Former    Packs/day: 1.00    Years: 42.00    Total pack years: 42.00    Types: Cigarettes    Start date: 10/20/1977    Quit date: 08/01/2021    Years since quitting: 1.2   Smokeless tobacco: Never  Vaping Use   Vaping Use: Never used  Substance and Sexual Activity   Alcohol use: Yes    Alcohol/week: 21.0 standard drinks of alcohol    Types: 7 Glasses of wine, 14 Standard drinks or equivalent per week    Comment: couple of drinks 1-2 nites per week    Drug use: No   Sexual activity: Not on file  Other Topics Concern   Not on file  Social History Narrative   She indicates that she has been most of her life sedentary, but started exercising in the fall of October 2022: Has started doing water aerobics.      She had work up to 4 days a week, but has had to scale down to about 2 days a week because of knee pain.   Social Determinants of Health   Financial Resource Strain: Low Risk  (04/07/2022)   Overall Financial Resource Strain (CARDIA)    Difficulty of Paying Living Expenses: Not hard at all  Food Insecurity: No Food Insecurity (04/07/2022)   Hunger Vital  Sign    Worried About Running Out of Food in the Last Year: Never true    Ran Out of Food in the Last Year: Never true  Transportation Needs: No Transportation Needs (04/07/2022)   PRAPARE - Hydrologist (Medical): No    Lack of Transportation (Non-Medical): No  Physical Activity: Sufficiently Active (04/07/2022)   Exercise Vital Sign    Days of Exercise per Week: 3 days    Minutes of Exercise per Session: 50 min  Stress: No Stress Concern Present (04/07/2022)   [place]    Feeling of Stress : Not at all  Social Connections: Socially Isolated (04/07/2022)   Social Connection and Isolation Panel [NHANES]    Frequency of Communication with Friends and Family: Three times a week    Frequency of Social Gatherings with Friends and Family: Three times a week    Attends Religious Services: Never    Active Member of Clubs or Organizations: No    Attends Archivist Meetings: Never    Marital Status: Widowed  Intimate Partner Violence: Not At Risk (04/07/2022)   Humiliation, Afraid, Rape, and Kick questionnaire    Fear of Current or Ex-Partner: No    Emotionally Abused: No    Physically Abused: No    Sexually Abused: No    FAMILY HISTORY Family History  Problem Relation Age of Onset   Breast cancer Sister 64       +lump and tamoxifen; eventually had bilateral mastectomies   Allergic rhinitis Sister    Colon cancer Father        dx. >79   Bladder Cancer Father        dx. 11-79; not a smoker   Skin cancer Father        basal cell carcinoma   Colon polyps Father    Colon cancer Paternal Grandfather        unspecified age   Melanoma Paternal Grandfather        unspecified age  Skin cancer Mother        NOS; dx. 48s   Other Mother        hx of hysterectomy in her early 15s for fibroid cysts   Heart attack Maternal Uncle 67   Heart attack Maternal Grandmother 62   Thyroid cancer  Paternal Grandmother 88   Breast cancer Sister        dx. early 53s; s/p BL mastectomies   Cervical cancer Sister        dx. late 106s   Skin cancer Brother        NOS   Anxiety disorder Other    Hypothyroidism Other    Hyperlipidemia Other    Diabetes Other    Esophageal cancer Neg Hx    Stomach cancer Neg Hx    Rectal cancer Neg Hx     ALLERGIES:  is allergic to ace inhibitors and penicillins.  MEDICATIONS:  Current Outpatient Medications  Medication Sig Dispense Refill   albuterol (VENTOLIN HFA) 108 (90 Base) MCG/ACT inhaler Inhale 2 puffs into the lungs every 4 (four) hours as needed for wheezing or shortness of breath (coughing fits). 18 g 1   aspirin 81 MG EC tablet Take 1 tablet (81 mg total) by mouth daily. Swallow whole. 30 tablet 12   atorvastatin (LIPITOR) 40 MG tablet Take 1 tablet (40 mg total) by mouth daily. 90 tablet 3   Budeson-Glycopyrrol-Formoterol (BREZTRI AEROSPHERE) 160-9-4.8 MCG/ACT AERO Inhale 2 puffs into the lungs in the morning and at bedtime. with spacer and rinse mouth afterwards. 10.7 g 5   Calcium Carb-Cholecalciferol 600-800 MG-UNIT TABS Take 1 tablet by mouth in the morning and at bedtime.      Cholecalciferol (VITAMIN D PO) Take 2,000 Units by mouth daily.      denosumab (PROLIA) 60 MG/ML SOSY injection Inject 60 mg into the skin every 6 (six) months.     folic acid (FOLVITE) 409 MCG tablet Take 400 mcg by mouth daily.     losartan (COZAAR) 50 MG tablet TAKE 1 TABLET EVERY DAY (ANNUAL APPOINTMENT DUE IN MAY, MUST SEE MD FOR FUTURE REFILLS) 90 tablet 3   metoprolol succinate (TOPROL-XL) 25 MG 24 hr tablet TAKE 1 TABLET BY MOUTH EVERY DAY 90 tablet 3   Olopatadine-Mometasone ([name]) 665-25 MCG/ACT SUSP Place 1-2 sprays into the nose in the morning and at bedtime. 29 g 5   valACYclovir (VALTREX) 1000 MG tablet Take 1 tablet (1,000 mg total) by mouth 2 (two) times daily. 20 tablet 2   tolterodine (DETROL LA) 4 MG 24 hr capsule Take 4 mg by mouth daily.  (Patient not taking: Reported on 10/31/2022)     No current facility-administered medications for this visit.    PHYSICAL EXAMINATION:  ECOG PERFORMANCE STATUS: 0 - Asymptomatic   Vitals:   10/31/22 1153 10/31/22 1156  BP: (!) 160/85 132/68  Pulse: 80   Resp: 16   Temp: (!) 97.3 F (36.3 C)   SpO2: 100%     Filed Weights   10/31/22 1153  Weight: 198 lb 12.8 oz (90.2 kg)     Physical Exam Vitals and nursing note reviewed.  Constitutional:      General: She is not in acute distress.    Appearance: Normal appearance. She is obese. She is not ill-appearing, toxic-appearing or diaphoretic.  HENT:     Head: Normocephalic and atraumatic.     Right Ear: External ear normal.     Left Ear: External ear normal.  Nose: Nose normal. No congestion or rhinorrhea.     Mouth/Throat:     Mouth: Mucous membranes are moist.     Pharynx: Oropharynx is clear. No oropharyngeal exudate or posterior oropharyngeal erythema.  Eyes:     General: No scleral icterus.    Extraocular Movements: Extraocular movements intact.     Conjunctiva/sclera: Conjunctivae normal.     Pupils: Pupils are equal, round, and reactive to light.  Cardiovascular:     Rate and Rhythm: Normal rate and regular rhythm.     Heart sounds: No murmur heard.    No friction rub. No gallop.  Pulmonary:     Effort: Pulmonary effort is normal. No respiratory distress.     Breath sounds: Normal breath sounds. No stridor. No wheezing, rhonchi or rales.  Chest:     Chest wall: No tenderness.  Abdominal:     General: Bowel sounds are normal. There is no distension.     Palpations: Abdomen is soft. There is no mass.     Tenderness: There is no abdominal tenderness. There is no guarding or rebound.     Hernia: No hernia is present.  Musculoskeletal:        General: No swelling, tenderness or deformity.     Cervical back: Normal range of motion and neck supple. No rigidity or tenderness.  Lymphadenopathy:     Head:      Right side of head: No submental, submandibular, tonsillar, preauricular, posterior auricular or occipital adenopathy.     Left side of head: No submental, submandibular, tonsillar, preauricular, posterior auricular or occipital adenopathy.     Cervical: No cervical adenopathy.     Right cervical: No superficial, deep or posterior cervical adenopathy.    Left cervical: No superficial, deep or posterior cervical adenopathy.     Upper Body:     Right upper body: No supraclavicular, axillary, pectoral or epitrochlear adenopathy.     Left upper body: No supraclavicular, axillary, pectoral or epitrochlear adenopathy.  Skin:    General: Skin is warm.     Coloration: Skin is not jaundiced.  Neurological:     General: No focal deficit present.     Mental Status: She is alert and oriented to person, place, and time. Mental status is at baseline.     Cranial Nerves: No cranial nerve deficit.     Motor: No weakness.     Gait: Gait normal.  Psychiatric:        Mood and Affect: Mood normal.        Behavior: Behavior normal.        Thought Content: Thought content normal.        Judgment: Judgment normal.      LABORATORY DATA: I have personally reviewed the data as listed:  Office Visit on 10/31/2022  Component Date Value Ref Range Status   WBC 10/31/2022 7.2  4.0 - 10.5 K/uL Final   RBC 10/31/2022 4.39  3.87 - 5.11 MIL/uL Final   Hemoglobin 10/31/2022 13.6  12.0 - 15.0 g/dL Final   HCT 10/31/2022 41.2  36.0 - 46.0 % Final   MCV 10/31/2022 93.8  80.0 - 100.0 fL Final   MCH 10/31/2022 31.0  26.0 - 34.0 pg Final   MCHC 10/31/2022 33.0  30.0 - 36.0 g/dL Final   RDW 10/31/2022 13.2  11.5 - 15.5 % Final   Platelets 10/31/2022 269  150 - 400 K/uL Final   nRBC 10/31/2022 0.0  0.0 - 0.2 % Final   Neutrophils  Relative % 10/31/2022 52  % Final   Neutro Abs 10/31/2022 3.7  1.7 - 7.7 K/uL Final   Lymphocytes Relative 10/31/2022 33  % Final   Lymphs Abs 10/31/2022 2.4  0.7 - 4.0 K/uL Final    Monocytes Relative 10/31/2022 9  % Final   Monocytes Absolute 10/31/2022 0.7  0.1 - 1.0 K/uL Final   Eosinophils Relative 10/31/2022 5  % Final   Eosinophils Absolute 10/31/2022 0.3  0.0 - 0.5 K/uL Final   Basophils Relative 10/31/2022 1  % Final   Basophils Absolute 10/31/2022 0.1  0.0 - 0.1 K/uL Final   Immature Granulocytes 10/31/2022 0  % Final   Abs Immature Granulocytes 10/31/2022 0.02  0.00 - 0.07 K/uL Final   Performed at [facility], [place] [address], [place], [place] [address]    RADIOGRAPHIC STUDIES: I have personally reviewed the radiological images as listed and agree with the findings in the report  No results found.  ASSESSMENT/PLAN  Eosinophils:  Bone marrow-derived leukocytes whose development and terminal differentiation are under the control of IL-3, GM-CSF, and IL-5 , with IL-5 being the cytokine that is primarily responsible for eosinophilopoiesis.    Eosinophilia:  Defined as > 450 eosinophils/ul as measured by sampling peripheral blood, although eosinophils are predominantly found in peripheral tissues Hypereosinophilia is defined as eosinophil count of at least 1.5 x 10 9th for 6 months.    The most likely explanation in this patient is asthma.  Eosinophilia has improved with use of inhaled agents Patient to undergo CT lung screening, mammogram in the near future so will hold off on CT CAP.  Can consider urine cytology to screen for bladder cancer   Somatic mutations in hematopoietic lineages:  The Jak2 panel demonstrated two variants of potential significance   Mutation in TET2 [phone]):  TET2 is commonly mutated in a range of myeloid disorders including myelodysplastic syndromes (MDS), myeloproliferative neoplasms (MPN), acute myeloid leukemia (AML), and chronic myelomonocytic leukemia (CMML) TET2 mutations are seen in approximately 20-25% of MDS cases. Of note, TET2 mutations may also be seen in the setting of age related clonal  hematopoiesis of indeterminate potential (CHIP) . One study found that TET2 mutations in MPN are associated with decreased overall survival and increased risk of leukemic transformation. In cases of confirmed myeloid neoplasms, including MDS, MPN, and AML, the prognostic significance of TET2 mutations remains under investigation.   variant in CUX1 ([id number]):  This variant has not been reported as a somatic variants in tumors (COSMIC). It has been observed as an extremely rare population variant in publicly available databases (gnomAD). Therefore, due to the paucity of functional and clinical evidence, its significance is currently unclear. It exhibits an allele frequency close to 50% raising the possibility of germline origin, and has not been documented in the ClinVar germline public database.  Probable Clonal Hematopoeisis of Indeterminate Potential (CHIP):  A condition of hematological malignancy-associated somatic mutation(s) in the absence of other hematological malignancy diagnostic criteria. It is due to accumulation of mutations over time.  It is especially common in the elderly (>10% in those > 65).  Also called age-related clonal hematopoiesis [facility]). The minimum variant allele frequency (VAF) for genetic variants in individuals to meet the criteria for CHIP is ?2% .  These are commonly driver mutations in TET2 and DNMT3A genes.    It is associated with an increased risk of progression to a hematological malignancy (0.5% to 1% per year)  This offers a reasonable explanation  as to the findings of the the above mentioned findings in the [place] 2 panel in the setting of a reactive eosinophilia   Personal history of colon polyps and family history of malignancy:  Last meeting with genetic counselor was 12 yrs ago.  Consider referring back.      Cancer Staging  No matching staging information was found for the patient.   No problem-specific Assessment & Plan notes found for this  encounter.   Orders Placed This Encounter  Procedures   CBC with Differential/Platelet   21  minutes was spent in patient care.  This included time spent preparing to see the patient (e.g., review of tests), obtaining and/or reviewing separately obtained history, counseling and educating the patient, ordering tests; documenting clinical information in the electronic or other health record, independently interpreting results and communicating results to the patient as well as coordination of care.      All questions were answered. The patient knows to call the clinic with any problems, questions or concerns.  This note was electronically signed.    Barbee Cough, MD  11/10/2022 11:33 AM

## 2022-11-04 ENCOUNTER — Telehealth: Payer: Self-pay | Admitting: Oncology

## 2022-11-04 NOTE — Telephone Encounter (Signed)
Spoke with patient confirming upcoming appointments  

## 2022-11-20 ENCOUNTER — Ambulatory Visit (INDEPENDENT_AMBULATORY_CARE_PROVIDER_SITE_OTHER): Payer: Medicare HMO | Admitting: Internal Medicine

## 2022-11-20 ENCOUNTER — Encounter: Payer: Self-pay | Admitting: Internal Medicine

## 2022-11-20 VITALS — BP 136/80 | HR 75 | Temp 98.3°F | Ht 63.0 in | Wt 199.0 lb

## 2022-11-20 DIAGNOSIS — R32 Unspecified urinary incontinence: Secondary | ICD-10-CM | POA: Diagnosis not present

## 2022-11-20 DIAGNOSIS — L989 Disorder of the skin and subcutaneous tissue, unspecified: Secondary | ICD-10-CM

## 2022-11-20 DIAGNOSIS — E559 Vitamin D deficiency, unspecified: Secondary | ICD-10-CM

## 2022-11-20 DIAGNOSIS — I10 Essential (primary) hypertension: Secondary | ICD-10-CM

## 2022-11-20 DIAGNOSIS — E785 Hyperlipidemia, unspecified: Secondary | ICD-10-CM | POA: Diagnosis not present

## 2022-11-20 DIAGNOSIS — E538 Deficiency of other specified B group vitamins: Secondary | ICD-10-CM | POA: Diagnosis not present

## 2022-11-20 DIAGNOSIS — R7302 Impaired glucose tolerance (oral): Secondary | ICD-10-CM

## 2022-11-20 DIAGNOSIS — I2583 Coronary atherosclerosis due to lipid rich plaque: Secondary | ICD-10-CM

## 2022-11-20 DIAGNOSIS — I2510 Atherosclerotic heart disease of native coronary artery without angina pectoris: Secondary | ICD-10-CM | POA: Diagnosis not present

## 2022-11-20 LAB — POCT GLYCOSYLATED HEMOGLOBIN (HGB A1C): Hemoglobin A1C: 5.1 % (ref 4.0–5.6)

## 2022-11-20 NOTE — Assessment & Plan Note (Signed)
Last vitamin D Lab Results  Component Value Date   VD25OH 57.18 04/18/2022   Stable, cont oral replacement  

## 2022-11-20 NOTE — Assessment & Plan Note (Signed)
Lab Results  Component Value Date   VITAMINB12 1,683 (H) 05/29/2022   Stable, cont oral replacement - b12 1000 mcg qd

## 2022-11-20 NOTE — Patient Instructions (Addendum)
Please have your Shingrix (shingles) shots done at your local pharmacy, and the RSV shot  Your A1c was done today  Please continue all other medications as before, and refills have been done if requested.  Please have the pharmacy call with any other refills you may need.  Please continue your efforts at being more active, low cholesterol diet, and weight control..  Please keep your appointments with your specialists as you may have planned  You will be contacted regarding the referral for: Dermatology, Urology, and Cardiology as you mentioned  Please make an Appointment to return in 6 months, or sooner if needed, also with Lab Appointment for testing done 3-5 days before at the [place] (so this is for TWO appointments - please see the scheduling desk as you leave)]

## 2022-11-20 NOTE — Progress Notes (Unsigned)
Cardiology Office Note:    Date:  11/20/2022   ID:  Crystal [name], DOB Jun 28, 1951, MRN [id number]  PCP:  Biagio Borg, MD  Cardiologist:  Glenetta Hew, MD  Electrophysiologist:  None   Referring MD: Biagio Borg, MD   Chief Complaint: routine follow-up of elevated calcium score   History of Present Illness:    Crystal [name] is a 72 y.o. 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 Ellyn Hack and presents today for routine follow-up.   Patient was referred to Dr. Ellyn Hack in 12/2021 for further evaluation of an elevated coronary calcium score. PCP ordered a chest CT in 06/2021 for routine screening on lung cancer and this showed aortic atherosclerosis sand coronary artery calcifications. A coronary calcium score was then ordered and came back at 173 (78th percentile for age and sex). She was then referred to Dr. Ellyn Hack. At that initial visit, she reported exertional dyspnea when walking up several flights of steps or going up an incline as well as some difficulty catching her breath when she is doing water aerobics but she denied any chest pain. Lipitor was increased at that visit and she was continued on Toprol-XL and Losartan.   Patient presents today for follow-up.   Elevated Coronary Calcium Score Aortic Atherosclerosis  Coronary calcifications and aortic atherosclerosis were noted on chest CT for lung cancer screening in 06/2021. A coronary calcium score was then ordered in 12/2021 and came back at 173 (78th percentile for age and sex) - No chest pain.  - Continue Aspirin '81mg'$  daily and Lipitor '40mg'$  daily.   Hypertension BP well controlled. *** - Continue current medications: Losartan '50mg'$  daily and Toprol-XL '25mg'$  daily.   Hyperlipidemia Lipid panel in  03/2022: Total Cholesterol 158, Triglycerides 53, HDL 83, LDL 63. LDL <70 given CAD.  - Continue Lipitor '40mg'$  daily.   Pre-Diabetes  Hemoglobin A1c *** - Followed by PCP.   Obstructive Sleep Apnea - Continue CPAP.    Past Medical History:  Diagnosis Date   ALLERGIC RHINITIS    Allergy    ANXIETY    no medations needed   Arthritis    Asthma    Bronchitis    Cataract    no surgery   COLONIC POLYPS, HX OF    Complication of anesthesia    oxygen sat dropped during endoscopy    COPD    pt unsure of this dx   Cough    DEPRESSION    Elevated coronary artery calcium score 12/2021   Agatston score 173-all LAD   Fatty liver    GERD    GLUCOSE INTOLERANCE    HYPERTENSION    IBS    Impaired glucose tolerance    Obstructive sleep apnea 06/25/2018   OSTEOPENIA    Osteopenia    Osteoporosis 07/30/2017   PERIPHERAL EDEMA    Pneumonia    Pneumonia, organism unspecified(486)    Sleep apnea    Oral Aparatus   TOBACCO USE DISORDER/SMOKER-SMOKING CESSATION DISCUSSED     Past Surgical History:  Procedure Laterality Date   birthmark removed     from back as a child   bladder mesh     2012   CESAREAN SECTION     x2   CHOLECYSTECTOMY     COLONOSCOPY  2018  TA   MOUTH SURGERY  04/25/2020   tissue graft -room of mouth to front gums   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   Procedure: LEFT TOTAL HIP ARTHROPLASTY ANTERIOR APPROACH;  Surgeon: Melrose Nakayama, MD;  Location: [facility];  Service: Orthopedics;  Laterality: Left;   WISDOM TOOTH EXTRACTION      Current Medications: No outpatient medications have been marked as taking for the 11/27/22 encounter (Appointment) with Darreld Mclean, PA-C.     Allergies:   Ace inhibitors and Penicillins   Social History   Socioeconomic History   Marital status: Widowed    Spouse name: Not on file   Number of children: 2   Years of education: Not on file   Highest education level: Not on file  Occupational History    Occupation: Occupational psychologist industries, signage for [employer]    Comment: Retired  Tobacco Use   Smoking status: Former    Packs/day: 1.00    Years: 42.00    Total pack years: 42.00    Types: Cigarettes    Start date: 10/20/1977    Quit date: 08/01/2021    Years since quitting: 1.3   Smokeless tobacco: Never  Vaping Use   Vaping Use: Never used  Substance and Sexual Activity   Alcohol use: Yes    Alcohol/week: 21.0 standard drinks of alcohol    Types: 7 Glasses of wine, 14 Standard drinks or equivalent per week    Comment: couple of drinks 1-2 nites per week    Drug use: No   Sexual activity: Not on file  Other Topics Concern   Not on file  Social History Narrative   She indicates that she has been most of her life sedentary, but started exercising in the fall of October 2022: Has started doing water aerobics.      She had work up to 4 days a week, but has had to scale down to about 2 days a week because of knee pain.   Social Determinants of Health   Financial Resource Strain: Low Risk  (04/07/2022)   Overall Financial Resource Strain (CARDIA)    Difficulty of Paying Living Expenses: Not hard at all  Food Insecurity: No Food Insecurity (04/07/2022)   Hunger Vital Sign    Worried About Running Out of Food in the Last Year: Never true    Ran Out of Food in the Last Year: Never true  Transportation Needs: No Transportation Needs (04/07/2022)   PRAPARE - Hydrologist (Medical): No    Lack of Transportation (Non-Medical): No  Physical Activity: Sufficiently Active (04/07/2022)   Exercise Vital Sign    Days of Exercise per Week: 3 days    Minutes of Exercise per Session: 50 min  Stress: No Stress Concern Present (04/07/2022)   [place]    Feeling of Stress : Not at all  Social Connections: Socially Isolated (04/07/2022)   Social Connection and Isolation Panel [NHANES]     Frequency of Communication with Friends and Family: Three times a week    Frequency of Social Gatherings with Friends and Family: Three times a week    Attends Religious Services: Never    Active Member of Clubs or Organizations: No    Attends Archivist Meetings: Never    Marital Status: Widowed     Family History: The patient's family history includes Allergic rhinitis in her sister; Anxiety disorder in an  other family member; Bladder Cancer in her father; Breast cancer in her sister; Breast cancer (age of onset: 2) in her sister; Cervical cancer in her sister; Colon cancer in her father and paternal grandfather; Colon polyps in her father; Diabetes in an other family member; Heart attack (age of onset: 100) in her maternal grandmother; Heart attack (age of onset: 5) in her maternal uncle; Hyperlipidemia in an other family member; Hypothyroidism in an other family member; Melanoma in her paternal grandfather; Other in her mother; Skin cancer in her brother, father, and mother; Thyroid cancer (age of onset: 8) in her paternal grandmother. There is no history of Esophageal cancer, Stomach cancer, or Rectal cancer.  ROS:   Please see the history of present illness.     EKGs/Labs/Other Studies Reviewed:    The following studies were reviewed:  CT Cardiac Scoring 12/26/2021: Impressions: Coronary calcium score of 173. This was 78th percentile for age-, race-, and sex-matched controls. Aortic atherosclerosis.  EKG:  EKG ordered today. EKG personally reviewed and demonstrates ***.  Recent Labs: 04/18/2022: TSH 1.11 06/27/2022: ALT 13; BUN 16; Creatinine 0.77; Potassium 4.0; Sodium 139 10/31/2022: Hemoglobin 13.6; Platelets 269  Recent Lipid Panel    Component Value Date/Time   CHOL 158 04/18/2022 0842   TRIG 53.0 04/18/2022 0842   HDL 83.70 04/18/2022 0842   CHOLHDL 2 04/18/2022 0842   VLDL 10.6 04/18/2022 0842   LDLCALC 63 04/18/2022 0842   LDLDIRECT 82.2 07/01/2013 0737     Physical Exam:    Vital Signs: There were no vitals taken for this visit.    Wt Readings from Last 3 Encounters:  11/20/22 199 lb (90.3 kg)  10/31/22 198 lb 12.8 oz (90.2 kg)  10/27/22 199 lb (90.3 kg)     General: 72 y.o. female in no acute distress. HEENT: Normocephalic and atraumatic. Sclera clear. EOMs intact. Neck: Supple. No carotid bruits. No JVD. Heart: *** RRR. Distinct S1 and S2. No murmurs, gallops, or rubs. Radial and distal pedal pulses 2+ and equal bilaterally. Lungs: No increased work of breathing. Clear to ausculation bilaterally. No wheezes, rhonchi, or rales.  Abdomen: Soft, non-distended, and non-tender to palpation. Bowel sounds present in all 4 quadrants.  MSK: Normal strength and tone for age. *** Extremities: No lower extremity edema.    Skin: Warm and dry. Neuro: Alert and oriented x3. No focal deficits. Psych: Normal affect. Responds appropriately.   Assessment:    No diagnosis found.  Plan:     Disposition: Follow up in ***   Medication Adjustments/Labs and Tests Ordered: Current medicines are reviewed at length with the patient today.  Concerns regarding medicines are outlined above.  No orders of the defined types were placed in this encounter.  No orders of the defined types were placed in this encounter.   There are no Patient Instructions on file for this visit.   Signed, Darreld Mclean, PA-C  11/20/2022 5:37 PM    [facility]

## 2022-11-20 NOTE — Assessment & Plan Note (Signed)
With worsening left scalp lesion - for derm referral

## 2022-11-20 NOTE — Assessment & Plan Note (Signed)
Mild chronic, for urology referral as requested

## 2022-11-20 NOTE — Assessment & Plan Note (Signed)
BP Readings from Last 3 Encounters:  11/20/22 136/80  10/31/22 132/68  10/27/22 136/72   Stable, pt to continue medical treatment losartan 50 mg qd, toprol xl 25 qd

## 2022-11-20 NOTE — Assessment & Plan Note (Signed)
Lab Results  Component Value Date   LDLCALC 63 04/18/2022   Stable, pt to continue current statin lipitor 40 mg qd

## 2022-11-20 NOTE — Progress Notes (Addendum)
Patient ID: Crystal [name], female   DOB: 18-Mar-1951, 72 y.o.   MRN: [id number]        Chief Complaint: follow up HTN, HLD and hyperglycemia, cad, urinary incontinence, and left scalp lesion       HPI:  Crystal [name] is a 72 y.o. female here overall doing ok,  Asks for referrals to cardiology (CAD), urology (incontinence), and Dermatology for a left scalp lesion getting larger and raised and pt has FH melanoma.  Pt denies chest pain, increased sob or doe, wheezing, orthopnea, PND, increased LE swelling, palpitations, dizziness or syncope.   Pt denies polydipsia, polyuria, or new focal neuro s/s.   Denies urinary symptoms such as dysuria, frequency, urgency, flank pain, hematuria or n/v, fever, chills.  Recent Dx Asthma with much improved with breztri use.   Wt Readings from Last 3 Encounters:  11/20/22 199 lb (90.3 kg)  10/31/22 198 lb 12.8 oz (90.2 kg)  10/27/22 199 lb (90.3 kg)   BP Readings from Last 3 Encounters:  11/20/22 136/80  10/31/22 132/68  10/27/22 136/72         Past Medical History:  Diagnosis Date   ALLERGIC RHINITIS    Allergy    ANXIETY    no medations needed   Arthritis    Asthma    Bronchitis    Cataract    no surgery   COLONIC POLYPS, HX OF    Complication of anesthesia    oxygen sat dropped during endoscopy    COPD    pt unsure of this dx   Cough    DEPRESSION    Elevated coronary artery calcium score 12/2021   Agatston score 173-all LAD   Fatty liver    GERD    GLUCOSE INTOLERANCE    HYPERTENSION    IBS    Impaired glucose tolerance    Obstructive sleep apnea 06/25/2018   OSTEOPENIA    Osteopenia    Osteoporosis 07/30/2017   PERIPHERAL EDEMA    Pneumonia    Pneumonia, organism unspecified(486)    Sleep apnea    Oral Aparatus   TOBACCO USE DISORDER/SMOKER-SMOKING CESSATION DISCUSSED    Past Surgical History:  Procedure Laterality Date   birthmark removed     from back as a child   bladder mesh     2012   CESAREAN SECTION     x2    CHOLECYSTECTOMY     COLONOSCOPY  2018   TA   MOUTH SURGERY  04/25/2020   tissue graft -room of mouth to front gums   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   Procedure: LEFT TOTAL HIP ARTHROPLASTY ANTERIOR APPROACH;  Surgeon: Melrose Nakayama, MD;  Location: [facility];  Service: Orthopedics;  Laterality: Left;   WISDOM TOOTH EXTRACTION      reports that she quit smoking about 15 months ago. Her smoking use included cigarettes. She started smoking about 45 years ago. She has a 42.00 pack-year smoking history. She has never used smokeless tobacco. She reports current alcohol use of about 21.0 standard drinks of alcohol per week. She reports that she does not use drugs. family history includes Allergic rhinitis in her sister; Anxiety disorder in an other family member; Bladder Cancer in her father; Breast cancer in her sister; Breast cancer (age of onset: 31) in her sister; Cervical cancer in her sister; Colon cancer in her father and paternal grandfather; Colon polyps in her father; Diabetes in an other family member; Heart attack (age of onset: 84)  in her maternal grandmother; Heart attack (age of onset: 36) in her maternal uncle; Hyperlipidemia in an other family member; Hypothyroidism in an other family member; Melanoma in her paternal grandfather; Other in her mother; Skin cancer in her brother, father, and mother; Thyroid cancer (age of onset: 67) in her paternal grandmother. Allergies  Allergen Reactions   Ace Inhibitors Cough   Penicillins Rash   Current Outpatient Medications on File Prior to Visit  Medication Sig Dispense Refill   albuterol (VENTOLIN HFA) 108 (90 Base) MCG/ACT inhaler Inhale 2 puffs into the lungs every 4 (four) hours as needed for wheezing or shortness of breath (coughing fits). 18 g 1   aspirin 81 MG EC tablet Take 1 tablet (81 mg total) by mouth daily. Swallow whole. 30 tablet 12   atorvastatin (LIPITOR) 40 MG tablet Take 1 tablet (40 mg total) by mouth daily. 90 tablet 3    Budeson-Glycopyrrol-Formoterol (BREZTRI AEROSPHERE) 160-9-4.8 MCG/ACT AERO Inhale 2 puffs into the lungs in the morning and at bedtime. with spacer and rinse mouth afterwards. 10.7 g 5   Calcium Carb-Cholecalciferol 600-800 MG-UNIT TABS Take 1 tablet by mouth in the morning and at bedtime.      Cholecalciferol (VITAMIN D PO) Take 2,000 Units by mouth daily.      denosumab (PROLIA) 60 MG/ML SOSY injection Inject 60 mg into the skin every 6 (six) months.     folic acid (FOLVITE) 790 MCG tablet Take 400 mcg by mouth daily.     losartan (COZAAR) 50 MG tablet TAKE 1 TABLET EVERY DAY (ANNUAL APPOINTMENT DUE IN MAY, MUST SEE MD FOR FUTURE REFILLS) 90 tablet 3   metoprolol succinate (TOPROL-XL) 25 MG 24 hr tablet TAKE 1 TABLET BY MOUTH EVERY DAY 90 tablet 3   Olopatadine-Mometasone ([name]) 665-25 MCG/ACT SUSP Place 1-2 sprays into the nose in the morning and at bedtime. 29 g 5   tolterodine (DETROL LA) 4 MG 24 hr capsule Take 4 mg by mouth daily. (Patient not taking: Reported on 10/31/2022)     No current facility-administered medications on file prior to visit.        ROS:  All others reviewed and negative.  Objective        PE:  BP 136/80 (BP Location: Left Arm, Patient Position: Sitting, Cuff Size: Large)   Pulse 75   Temp 98.3 F (36.8 C) (Oral)   Ht '5\' 3"'$  (1.6 m)   Wt 199 lb (90.3 kg)   SpO2 98%   BMI 35.25 kg/m                 Constitutional: Pt appears in NAD               HENT: Head: NCAT.                Right Ear: External ear normal.                 Left Ear: External ear normal.                Eyes: . Pupils are equal, round, and reactive to light. Conjunctivae and EOM are normal               Nose: without d/c or deformity               Neck: Neck supple. Gross normal ROM               Cardiovascular: Normal rate and regular rhythm.  Pulmonary/Chest: Effort normal and breath sounds without rales or wheezing.                Abd:  Soft, NT, ND, + BS, no  organomegaly               Neurological: Pt is alert. At baseline orientation, motor grossly intact               Skin: Skin is warm. No rashes, no other new lesions, LE edema - none               Psychiatric: Pt behavior is normal without agitation   Micro: none  Cardiac tracings I have personally interpreted today:  none  Pertinent Radiological findings (summarize): none   Lab Results  Component Value Date   WBC 7.2 10/31/2022   HGB 13.6 10/31/2022   HCT 41.2 10/31/2022   PLT 269 10/31/2022   GLUCOSE 106 (H) 06/27/2022   CHOL 158 04/18/2022   TRIG 53.0 04/18/2022   HDL 83.70 04/18/2022   LDLDIRECT 82.2 07/01/2013   LDLCALC 63 04/18/2022   ALT 13 06/27/2022   AST 18 06/27/2022   NA 139 06/27/2022   K 4.0 06/27/2022   CL 104 06/27/2022   CREATININE 0.77 06/27/2022   BUN 16 06/27/2022   CO2 30 06/27/2022   TSH 1.11 04/18/2022   INR 1.0 08/31/2020   HGBA1C 5.5 03/11/2021   POCT - A1c - 5.1 Hemoglobin A1C 4.0 - 5.6 % 5.1 5.5   Assessment/Plan:  Crystal [name] is a 72 y.o. White or Caucasian [1] female with  has a past medical history of ALLERGIC RHINITIS, Allergy, ANXIETY, Arthritis, Asthma, Bronchitis, Cataract, COLONIC POLYPS, HX OF, Complication of anesthesia, COPD, Cough, DEPRESSION, Elevated coronary artery calcium score (12/2021), Fatty liver, GERD, GLUCOSE INTOLERANCE, HYPERTENSION, IBS, Impaired glucose tolerance, Obstructive sleep apnea (06/25/2018), OSTEOPENIA, Osteopenia, Osteoporosis (07/30/2017), PERIPHERAL EDEMA, Pneumonia, Pneumonia, organism unspecified(486), Sleep apnea, and TOBACCO USE DISORDER/SMOKER-SMOKING CESSATION DISCUSSED.  B12 deficiency Lab Results  Component Value Date   VITAMINB12 1,683 (H) 05/29/2022   Stable, cont oral replacement - b12 1000 mcg qd   Essential hypertension BP Readings from Last 3 Encounters:  11/20/22 136/80  10/31/22 132/68  10/27/22 136/72   Stable, pt to continue medical treatment losartan 50 mg qd, toprol xl 25  qd   Hyperlipidemia with target LDL less than 70 Lab Results  Component Value Date   LDLCALC 63 04/18/2022   Stable, pt to continue current statin lipitor 40 mg qd   Impaired glucose tolerance Lab Results  Component Value Date   HGBA1C 5.5 03/11/2021   Today - A1c POCT - 5.1  Stable, pt to continue current medical treatment  - diet, wt control   Vitamin D deficiency Last vitamin D Lab Results  Component Value Date   VD25OH 57.18 04/18/2022   Stable, cont oral replacement   Urinary incontinence Mild chronic, for urology referral as requested  Coronary artery disease due to lipid rich plaque Stable clinically, for card referral Dr Ellyn Hack per pt request  Skin lesion With worsening left scalp lesion - for derm referral  Followup: No follow-ups on file.  Cathlean Cower, MD 11/20/2022 9:45 AM [facility]

## 2022-11-20 NOTE — Assessment & Plan Note (Signed)
Lab Results  Component Value Date   HGBA1C 5.5 03/11/2021   Today - A1c POCT - 5.1  Stable, pt to continue current medical treatment  - diet, wt control

## 2022-11-20 NOTE — Assessment & Plan Note (Signed)
Stable clinically, for card referral Dr Ellyn Hack per pt request

## 2022-11-24 DIAGNOSIS — M1711 Unilateral primary osteoarthritis, right knee: Secondary | ICD-10-CM | POA: Diagnosis not present

## 2022-11-24 DIAGNOSIS — M1712 Unilateral primary osteoarthritis, left knee: Secondary | ICD-10-CM | POA: Diagnosis not present

## 2022-11-24 DIAGNOSIS — M170 Bilateral primary osteoarthritis of knee: Secondary | ICD-10-CM | POA: Diagnosis not present

## 2022-11-25 ENCOUNTER — Telehealth: Payer: Self-pay | Admitting: Internal Medicine

## 2022-11-25 NOTE — Telephone Encounter (Signed)
For our records:  We have received Pre-Op PW for the pt and it has been placed in Dr. Judi Cong' boxes.  Upon completion please fax to: [phone]

## 2022-11-26 ENCOUNTER — Telehealth: Payer: Self-pay | Admitting: *Deleted

## 2022-11-26 NOTE — Telephone Encounter (Signed)
Forms received, office notes attached and given to [place] for signature

## 2022-11-26 NOTE — Telephone Encounter (Signed)
   Name: Crystal [name]  DOB: [birth year]  MRN: [id number]  Primary Cardiologist: Glenetta Hew, MD  Chart reviewed as part of pre-operative protocol coverage. The patient has an upcoming visit scheduled with Sande Rives, PA on 11/27/2022 at which time clearance can be addressed in case there are any issues that would impact surgical recommendations.  L knee arthroplasty is not scheduled until TBD as below. I added preop FYI to appointment note so that provider is aware to address at time of outpatient visit.  Per office protocol the cardiology provider should forward their finalized clearance decision and recommendations regarding antiplatelet therapy to the requesting party below.    I will route this message as FYI to requesting party and remove this message from the preop box as separate preop APP input not needed at this time.   Please call with any questions.  Lenna Sciara, NP  11/26/2022, 1:34 PM

## 2022-11-26 NOTE — Telephone Encounter (Signed)
   Pre-operative Risk Assessment    Patient Name: Crystal [name]  DOB: Dec 03, 1950 MRN: [id number]      Request for Surgical Clearance    Procedure:   LEFT KNEE ARTHROPLASTY   Date of Surgery:  Clearance TBD                                 Surgeon:  Tyrone Sage, MD Surgeon's Group or Practice Name:  [facility] Phone number:  [phone] Fax number:  [phone]   Type of Clearance Requested:   - Medical  - Pharmacy:  Hold Aspirin NOT INDICATED HOW LONG   Type of Anesthesia:  Spinal   Additional requests/questions:    Astrid Divine   11/26/2022, 9:49 AM

## 2022-11-26 NOTE — Telephone Encounter (Signed)
Pt forms is signed and faxed

## 2022-11-26 NOTE — Progress Notes (Unsigned)
Cardiology Clinic Note   Patient Name: Crystal [name] Date of Encounter: 11/27/2022  Primary Care Provider:  Biagio Borg, MD Primary Cardiologist:  Glenetta Hew, MD  Patient Profile    Crystal [name] is a 72 y.o. female with a past medical history of elevated calcium score, aortic atherosclerosis, hypertension hyperlipidemia, COPD, GERD who presents to the clinic today for follow-up of chronic cardiac conditions and preoperative risk assessment.   Past Medical History    Past Medical History:  Diagnosis Date   ALLERGIC RHINITIS    Allergy    ANXIETY    no medations needed   Arthritis    Asthma    Bronchitis    Cataract    no surgery   COLONIC POLYPS, HX OF    Complication of anesthesia    oxygen sat dropped during endoscopy    COPD    pt unsure of this dx   Cough    DEPRESSION    Elevated coronary artery calcium score 12/2021   Agatston score 173-all LAD   Fatty liver    GERD    GLUCOSE INTOLERANCE    HYPERTENSION    IBS    Impaired glucose tolerance    Obstructive sleep apnea 06/25/2018   OSTEOPENIA    Osteopenia    Osteoporosis 07/30/2017   PERIPHERAL EDEMA    Pneumonia    Pneumonia, organism unspecified(486)    Sleep apnea    Oral Aparatus   TOBACCO USE DISORDER/SMOKER-SMOKING CESSATION DISCUSSED    Past Surgical History:  Procedure Laterality Date   birthmark removed     from back as a child   bladder mesh     2012   CESAREAN SECTION     x2   CHOLECYSTECTOMY     COLONOSCOPY  2018   TA   MOUTH SURGERY  04/25/2020   tissue graft -room of mouth to front gums   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   Procedure: LEFT TOTAL HIP ARTHROPLASTY ANTERIOR APPROACH;  Surgeon: Melrose Nakayama, MD;  Location: [facility];  Service: Orthopedics;  Laterality: Left;   WISDOM TOOTH EXTRACTION      Allergies  Allergies  Allergen Reactions   Ace Inhibitors Cough   Penicillins Rash    History of Present Illness    Crystal [name] has a past  medical history of: Elevated calcium score/aortic atherosclerosis.  CT calcium scoring 12/26/2021: Calcium score 173 (78th percentile).  Aortic atherosclerosis. Hypertension. Hyperlipidemia. Lipid panel 04/18/2022: 63, HDL 84, TG 53, total 158. COPD.  Ms. Fletchall was first evaluated by Dr. Ellyn Hack on 01/03/2022 for an elevated calcium score.  At that time importance of risk factor modification was stressed.  Statin was increased.  Today, patient is doing well. She denies shortness of breath or dyspnea on exertion. No chest pain or pressure. Denies lower extremity edema, orthopnea, or PND. No palpitations. She reports a vague sensation of central chest muscular "pulling" that only occurs at rest, is very brief (<1 second), and very occasional. This is not something she is really worried about. 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 She is slightly hypertensive at intake with BP of 150/80. Recheck BP 136/80. She has not taken her medicine this morning, as she normally takes it after exercising (around 9:30-10 am). She continues to not smoke. She tries to eat a heart healthy diet but does go out to eat often with her  group of friends. Patient is awaiting scheduling for left knee arthroplasty. She needs to have this taken care of before she can have her right hip replaced. She reports knee pain that is worsened with prolonged walking.    Home Medications    Current Meds  Medication Sig   albuterol (VENTOLIN HFA) 108 (90 Base) MCG/ACT inhaler Inhale 2 puffs into the lungs every 4 (four) hours as needed for wheezing or shortness of breath (coughing fits).   aspirin 81 MG EC tablet Take 1 tablet (81 mg total) by mouth daily. Swallow whole.   atorvastatin (LIPITOR) 40 MG tablet Take 1 tablet (40 mg total) by mouth daily.   Budeson-Glycopyrrol-Formoterol (BREZTRI AEROSPHERE) 160-9-4.8 MCG/ACT AERO Inhale 2 puffs into the lungs in  the morning and at bedtime. with spacer and rinse mouth afterwards.   Calcium Carb-Cholecalciferol 600-800 MG-UNIT TABS Take 1 tablet by mouth in the morning and at bedtime.    Cholecalciferol (VITAMIN D PO) Take 2,000 Units by mouth daily.    denosumab (PROLIA) 60 MG/ML SOSY injection Inject 60 mg into the skin every 6 (six) months.   folic acid (FOLVITE) 818 MCG tablet Take 400 mcg by mouth daily.   losartan (COZAAR) 50 MG tablet TAKE 1 TABLET EVERY DAY (ANNUAL APPOINTMENT DUE IN MAY, MUST SEE MD FOR FUTURE REFILLS)   metoprolol succinate (TOPROL-XL) 25 MG 24 hr tablet TAKE 1 TABLET BY MOUTH EVERY DAY   Olopatadine-Mometasone ([name]) 665-25 MCG/ACT SUSP Place 1-2 sprays into the nose in the morning and at bedtime.   tolterodine (DETROL LA) 4 MG 24 hr capsule Take 4 mg by mouth daily.    Family History    Family History  Problem Relation Age of Onset   Breast cancer Sister 69       +lump and tamoxifen; eventually had bilateral mastectomies   Allergic rhinitis Sister    Colon cancer Father        dx. >101   Bladder Cancer Father        dx. 36-79; not a smoker   Skin cancer Father        basal cell carcinoma   Colon polyps Father    Colon cancer Paternal Grandfather        unspecified age   Melanoma Paternal Grandfather        unspecified age   Skin cancer Mother        NOS; dx. 42s   Other Mother        hx of hysterectomy in her early 58s for fibroid cysts   Heart attack Maternal Uncle 67   Heart attack Maternal Grandmother 62   Thyroid cancer Paternal Grandmother 88   Breast cancer Sister        dx. early 15s; s/p BL mastectomies   Cervical cancer Sister        dx. late 99s   Skin cancer Brother        NOS   Anxiety disorder Other    Hypothyroidism Other    Hyperlipidemia Other    Diabetes Other    Esophageal cancer Neg Hx    Stomach cancer Neg Hx    Rectal cancer Neg Hx    She indicated that her mother is alive. She indicated that her father is deceased. She  indicated that both of her sisters are alive. She indicated that both of her brothers are alive. She indicated that her maternal grandmother is deceased. She indicated that her maternal grandfather is deceased. She indicated  that her paternal grandmother is deceased. She indicated that her paternal grandfather is deceased. She indicated that her daughter is alive. She indicated that her son is alive. She indicated that her maternal aunt is deceased. She indicated that one of her two maternal uncles is deceased. She reported the following about her maternal uncle of unknown status: no info. She indicated that her paternal uncle is deceased. She indicated that the status of her neg hx is unknown.   Social History    Social History   Socioeconomic History   Marital status: Widowed    Spouse name: Not on file   Number of children: 2   Years of education: Not on file   Highest education level: Not on file  Occupational History   Occupation: Occupational psychologist industries, signage for [employer]    Comment: Retired  Tobacco Use   Smoking status: Former    Packs/day: 1.00    Years: 42.00    Total pack years: 42.00    Types: Cigarettes    Start date: 10/20/1977    Quit date: 08/01/2021    Years since quitting: 1.3   Smokeless tobacco: Never  Vaping Use   Vaping Use: Never used  Substance and Sexual Activity   Alcohol use: Yes    Alcohol/week: 21.0 standard drinks of alcohol    Types: 7 Glasses of wine, 14 Standard drinks or equivalent per week    Comment: couple of drinks 1-2 nites per week    Drug use: No   Sexual activity: Not on file  Other Topics Concern   Not on file  Social History Narrative   She indicates that she has been most of her life sedentary, but started exercising in the fall of October 2022: Has started doing water aerobics.      She had work up to 4 days a week, but has had to scale down to about 2 days a week because of knee pain.   Social Determinants of  Health   Financial Resource Strain: Low Risk  (04/07/2022)   Overall Financial Resource Strain (CARDIA)    Difficulty of Paying Living Expenses: Not hard at all  Food Insecurity: No Food Insecurity (04/07/2022)   Hunger Vital Sign    Worried About Running Out of Food in the Last Year: Never true    Ran Out of Food in the Last Year: Never true  Transportation Needs: No Transportation Needs (04/07/2022)   PRAPARE - Hydrologist (Medical): No    Lack of Transportation (Non-Medical): No  Physical Activity: Sufficiently Active (04/07/2022)   Exercise Vital Sign    Days of Exercise per Week: 3 days    Minutes of Exercise per Session: 50 min  Stress: No Stress Concern Present (04/07/2022)   [place]    Feeling of Stress : Not at all  Social Connections: Socially Isolated (04/07/2022)   Social Connection and Isolation Panel [NHANES]    Frequency of Communication with Friends and Family: Three times a week    Frequency of Social Gatherings with Friends and Family: Three times a week    Attends Religious Services: Never    Active Member of Clubs or Organizations: No    Attends Archivist Meetings: Never    Marital Status: Widowed  Intimate Partner Violence: Not At Risk (04/07/2022)   Humiliation, Afraid, Rape, and Kick questionnaire    Fear of Current or Ex-Partner: No  Emotionally Abused: No    Physically Abused: No    Sexually Abused: No     Review of Systems    General:  No chills, fever, night sweats or weight changes.  Cardiovascular:  No chest pain, dyspnea on exertion, edema, orthopnea, palpitations, paroxysmal nocturnal dyspnea. Dermatological: No rash, lesions/masses Respiratory: No cough, dyspnea Urologic: No hematuria, dysuria Abdominal:   No nausea, vomiting, diarrhea, bright red blood per rectum, melena, or hematemesis Neurologic:  No visual changes, weakness, changes in  mental status. All other systems reviewed and are otherwise negative except as noted above.  Physical Exam    VS:  BP (!) 150/80   Pulse 72   Ht '5\' 3"'$  (1.6 m)   Wt 200 lb 9.6 oz (91 kg)   SpO2 97%   BMI 35.53 kg/m  , BMI Body mass index is 35.53 kg/m. GEN:  Well nourished, well developed, in no acute distress. HEENT: Normal. Neck: Supple, no JVD, carotid bruits, or masses. Cardiac: RRR, no murmurs, rubs, or gallops. No clubbing, cyanosis, edema.  Radials/DP/PT 2+ and equal bilaterally.  Respiratory:  Respirations regular and unlabored, clear to auscultation bilaterally. GI: Soft, nontender, nondistended. MS: No deformity or atrophy. Skin: Warm and dry, no rash. Neuro: Strength and sensation are intact. Psych: Normal affect.  Accessory Clinical Findings    Recent Labs: 04/18/2022: TSH 1.11 06/27/2022: ALT 13; BUN 16; Creatinine 0.77; Potassium 4.0; Sodium 139 10/31/2022: Hemoglobin 13.6; Platelets 269   Recent Lipid Panel    Component Value Date/Time   CHOL 158 04/18/2022 0842   TRIG 53.0 04/18/2022 0842   HDL 83.70 04/18/2022 0842   CHOLHDL 2 04/18/2022 0842   VLDL 10.6 04/18/2022 0842   LDLCALC 63 04/18/2022 0842   LDLDIRECT 82.2 07/01/2013 0737    ECG personally reviewed by me today: NSR, rate 72 bpm.  No significant changes from 01/03/2022.  Assessment & Plan   Elevated calcium score/aortic atherosclerosis.  CT calcium score March 2023 173, aortic atherosclerosis.  Patient denies chest pain, pressure or tightness. She reports a vague sensation of central chest muscular "pulling: that only occurs at rest, is very brief (<1 second) and very occasional. She never has this sensation while walking or attending water aerobics. This does not sound cardiac in nature. Patient is instructed to contact office if sensation becomes more consistent or occurs with exertion. Continue aspirin, Lipitor, metoprolol. Hypertension.  BP today 150/80 at intake and 136/80 at recheck. Patient  has not had her medications yet this morning. Patient denies headaches or dizziness.  Continue metoprolol and losartan. Hyperlipidemia.  LDL June 2023 63, at goal continue Lipitor. Followed by PCP.  Preoperative cardiovascular risk assessment. Awaiting scheduling for left knee arthoplasty with Dr. Harrie Jeans.  Chart reviewed as part of pre-operative protocol coverage. Patient does not have a history of ischemic heart disease, PCI, heart failure, or stroke. According to the RCRI, patient has a 0.4% risk of MACE. Patient reports activity equivalent to 4.0 METS (Water aerobics three times a week).  Given past medical history and time since last visit, based on ACC/AHA guidelines, CATHYE KREITER would be at acceptable risk for the planned procedure without further cardiovascular testing.   May hold aspirin for 7 days prior to surgery and resume as soon as surgeon deems safe.   Patient was advised that if she develops new symptoms prior to surgery to contact our office to arrange a follow-up appointment.  She verbalized understanding.      Disposition: Return in 1 year  or sooner as needed.    Justice Britain. Jaisha Villacres, DNP, NP-C     11/27/2022, 9:04 AM [address] [place] [address] Office [phone] Fax [phone]

## 2022-11-27 ENCOUNTER — Ambulatory Visit: Payer: Medicare HMO | Attending: Student | Admitting: Student

## 2022-11-27 ENCOUNTER — Encounter: Payer: Self-pay | Admitting: Student

## 2022-11-27 VITALS — BP 136/80 | HR 72 | Ht 63.0 in | Wt 200.6 lb

## 2022-11-27 DIAGNOSIS — R931 Abnormal findings on diagnostic imaging of heart and coronary circulation: Secondary | ICD-10-CM | POA: Diagnosis not present

## 2022-11-27 DIAGNOSIS — E785 Hyperlipidemia, unspecified: Secondary | ICD-10-CM | POA: Diagnosis not present

## 2022-11-27 DIAGNOSIS — I10 Essential (primary) hypertension: Secondary | ICD-10-CM

## 2022-11-27 DIAGNOSIS — Z01810 Encounter for preprocedural cardiovascular examination: Secondary | ICD-10-CM | POA: Diagnosis not present

## 2022-11-27 NOTE — Patient Instructions (Signed)
Medication Instructions:  Your physician recommends that you continue on your current medications as directed. Please refer to the Current Medication list given to you today.  *If you need a refill on your cardiac medications before your next appointment, please call your pharmacy*   Lab Work: NONE If you have labs (blood work) drawn today and your tests are completely normal, you will receive your results only by: MyChart Message (if you have MyChart) OR A paper copy in the mail If you have any lab test that is abnormal or we need to change your treatment, we will call you to review the results.   Testing/Procedures: NONE   Follow-Up: At [facility], you and your health needs are our priority.  As part of our continuing mission to provide you with exceptional heart care, we have created designated Provider Care Teams.  These Care Teams include your primary Cardiologist (physician) and Advanced Practice Providers (APPs -  Physician Assistants and Nurse Practitioners) who all work together to provide you with the care you need, when you need it.  We recommend signing up for the patient portal called "MyChart".  Sign up information is provided on this After Visit Summary.  MyChart is used to connect with patients for Virtual Visits (Telemedicine).  Patients are able to view lab/test results, encounter notes, upcoming appointments, etc.  Non-urgent messages can be sent to your provider as well.   To learn more about what you can do with MyChart, go to [email or web address].    Your next appointment:   1 year(s)  Provider:   David Harding, MD    

## 2022-11-28 ENCOUNTER — Telehealth: Payer: Self-pay

## 2022-11-28 NOTE — Telephone Encounter (Signed)
Faxed signed Surgical Clearance to [facility] as per Dr. Federico Flake

## 2022-12-01 ENCOUNTER — Telehealth: Payer: Self-pay | Admitting: Internal Medicine

## 2022-12-02 DIAGNOSIS — L821 Other seborrheic keratosis: Secondary | ICD-10-CM | POA: Diagnosis not present

## 2022-12-02 DIAGNOSIS — L820 Inflamed seborrheic keratosis: Secondary | ICD-10-CM | POA: Diagnosis not present

## 2022-12-03 NOTE — Telephone Encounter (Signed)
Called and left voicemail for Wells Guiles to fax request again

## 2022-12-10 NOTE — Telephone Encounter (Signed)
Wells Guiles from [facility] called to check status of surgical clearance- no documentation if received so had her refax. Received and placed in surgical clearance box.

## 2022-12-10 NOTE — Telephone Encounter (Signed)
Fax received from Dr. Alyson Ingles ([facility]) fax- [phone] for total left knee arthroplasty   Patient needs surgery clearance. Surgery is TBD. Patient was seen on 05/08/22. Office protocol is a risk assessment can be sent to surgeon if patient has been seen in 60 days or less.   Dr Annamaria Boots- I have scheduled this pt to see you on 12/16/22 for risk assessment. This surgery to be done under spinal anesthetic. Please send this note to sur clearance pool once you have completed ov/risk assessment and we will fax to [facility].

## 2022-12-13 NOTE — Progress Notes (Unsigned)
HPI Female Smoker followed for OSA/ oral appliance, complicated by allergic rhinitis, anxiety, COPD, HBP, GERD HST 07/20/2018-AHI 20/hour, desaturation to 81%, body weight 192 pounds PFT 09/04/21- WNL. Possible restriction  ====================================================================  05/08/22-72 year old female former Smoker (42pkyrs) followed for OSA, complicated by allergic rhinitis, anxiety, COPD, HBP, GERD Allergist  and PCP were following COPD    Covid vax-4 Phizer CPAP auto 5-15/ [employer] Download- compliance  100%, AHI 0.3/ hr Body weight today-203 lbs CT chest low dose screening due in Sept. Download reviewed.  Good compliance and control. Known COPD is followed by her primary physician and by her allergist. Blames recent [place]ra smoke for increased cough.  Felt better after a prednisone course from Dr. Jenny Reichmann.  Residual sputum is clear and sinuses now feel clear.  [facility] the [facility] are [facility].  12/16/22- 72 year old female former Smoker (42pkyrs) followed for OSA, complicated by allergic rhinitis, anxiety, COPD, HBP, GERD Allergist  and PCP were following COPD    Covid vax-4 Phizer Flu vax- CPAP auto 5-15/ [employer] Download- compliance  99%, AHI 0.3/ hr Body weight today-201 lbs Needs surgical clearance for LTKR> Magda Paganini She is very comfortable with her CPAP and has no concerns.  Download reviewed. I advised her to take her CPAP with her for use in the recovery room after surgery, and to discuss this with her anesthesiologist. She is aware of some coronary disease and has been cleared by cardiology for pending surgery. Breathing now is quite comfortable.  She reports an asthma episode last summer.  Her allergist put her on Judithann Sauger which has worked very well.  She has only needed to use her rescue inhaler about 6 times this year so far. She is clear from a pulmonary/sleep medicine standpoint to go forward with the planned surgery. CT chest low dose  screening 07/17/22-  IMPRESSION: 1. Lung-RADS 1, negative. Continue annual screening with low-dose chest CT without contrast in 12 months. 2. Moderate coronary artery calcifications of the LAD. 3. Aortic Atherosclerosis (ICD10-I70.0) and Emphysema (ICD10-J43.9).  ROS-see HPI   + = positive Constitutional:    weight loss, night sweats, fevers, chills, +fatigue, lassitude. HEENT:    headaches, difficulty swallowing, tooth/dental problems, sore throat,       sneezing, itching, ear ache, nasal congestion, post nasal drip, snoring CV:    chest pain, orthopnea, PND, +swelling in lower extremities, anasarca,                                   dizziness, palpitations Resp:  + shortness of breath with exertion or at rest.                productive cough,   non-productive cough, coughing up of blood.              change in color of mucus.  wheezing.   Skin:    rash or lesions. GI:  No-   heartburn, indigestion, abdominal pain, nausea, vomiting, diarrhea,                 change in bowel habits, loss of appetite GU: dysuria, change in color of urine, no urgency or frequency.   flank pain. MS:   joint pain, stiffness, decreased range of motion, back pain. Neuro-     nothing unusual Psych:  change in mood or affect.  depression or anxiety.   memory loss.  OBJ- Physical Exam General- Alert, Oriented, Affect-appropriate,  Distress- none acute, + overweight Skin- rash-none, lesions- none, excoriation- none Lymphadenopathy- none Head- atraumatic            Eyes- Gross vision intact, PERRLA, conjunctivae and secretions clear            Ears- Hearing, canals-normal            Nose- Clear, no-Septal dev, mucus, polyps, erosion, perforation             Throat- Mallampati II-III , mucosa clear , drainage- none, tonsils- atrophic Neck- flexible , trachea midline, no stridor , thyroid nl, carotid no bruit Chest - symmetrical excursion , unlabored           Heart/CV- RRR , no murmur , no gallop  , no rub, nl  s1 s2                           - JVD- none , edema- none, stasis changes- none, varices- none           Lung- clear to P&A, wheeze- none, cough-none, dullness-none, rub- none           Chest wall-  Abd-  Br/ Gen/ Rectal- Not done, not indicated Extrem- +heavy legs with small superficial varices, but no pitting edema. Neg Homan's. Neuro- grossly intact to observation

## 2022-12-16 ENCOUNTER — Encounter: Payer: Self-pay | Admitting: Internal Medicine

## 2022-12-16 ENCOUNTER — Ambulatory Visit: Payer: Medicare HMO | Admitting: Internal Medicine

## 2022-12-16 VITALS — BP 128/86 | HR 66 | Ht 63.0 in | Wt 201.4 lb

## 2022-12-16 DIAGNOSIS — G4733 Obstructive sleep apnea (adult) (pediatric): Secondary | ICD-10-CM | POA: Diagnosis not present

## 2022-12-16 DIAGNOSIS — Z01818 Encounter for other preprocedural examination: Secondary | ICD-10-CM | POA: Diagnosis not present

## 2022-12-16 DIAGNOSIS — J4489 Other specified chronic obstructive pulmonary disease: Secondary | ICD-10-CM | POA: Diagnosis not present

## 2022-12-16 NOTE — Patient Instructions (Signed)
You are clear to proceed with planned surgery from a Pulmonary/ Sleep Medicine perspective.  As discussed, I suggest you take your CPAP machine with you to wear in the Recovery room after surgery. You can discuss this with your Anesthesiologist.  Ok to continue CPAP auto 5-15  We can change your follow--up to see you again in a year unless needed sooner.

## 2022-12-16 NOTE — Assessment & Plan Note (Signed)
She reports good symptom control with Breztri.

## 2022-12-16 NOTE — Telephone Encounter (Signed)
Forwarding to Rx Prior Auth Team 

## 2022-12-16 NOTE — Assessment & Plan Note (Signed)
Benefits from CPAP with good compliance and control Plan- continue auto 5-15 

## 2022-12-16 NOTE — Telephone Encounter (Signed)
OV notes and clearance form have been faxed back to [facility]. Nothing further needed at this time.

## 2022-12-16 NOTE — Assessment & Plan Note (Signed)
She is clear from a pulmonary/sleep medicine standpoint to go forward with planned knee surgery.

## 2022-12-19 ENCOUNTER — Other Ambulatory Visit: Payer: Self-pay | Admitting: Orthopaedic Surgery

## 2022-12-26 ENCOUNTER — Other Ambulatory Visit (HOSPITAL_COMMUNITY): Payer: Self-pay

## 2022-12-26 NOTE — Telephone Encounter (Signed)
Prolia VOB initiated via [email or web address] 

## 2022-12-26 NOTE — Telephone Encounter (Signed)
Pharmacy Patient Advocate Encounter   Received notification that prior authorization for Prolia '60mg'$  is required/requested.  Per Test Claim: $95 with pharmacy benefit   PA submitted on 12/26/22 to (ins) [employer] via CoverMyMeds Key or (Medicaid) confirmation # [id number] Status is pending

## 2022-12-29 ENCOUNTER — Other Ambulatory Visit (HOSPITAL_COMMUNITY): Payer: Self-pay

## 2022-12-29 ENCOUNTER — Telehealth: Payer: Self-pay

## 2022-12-29 NOTE — Telephone Encounter (Signed)
Prior authorization is required for [facility]. PA submitted and APPROVED on 12/26/2022.   Effective: 10/20/2021 - 10/20/2023

## 2022-12-30 MED ORDER — DENOSUMAB 60 MG/ML ~~LOC~~ SOSY: 60.0000 mg | PREFILLED_SYRINGE | SUBCUTANEOUS | 0 refills | Status: DC

## 2022-12-30 NOTE — Telephone Encounter (Signed)
Called pt inform of Prolia coverage. Since it is cheaper at pharmacy pt will pick-up from [facility] and once she have will call to make nurse visit,,,.lmb

## 2022-12-30 NOTE — Telephone Encounter (Signed)
Pt ready for scheduling for Prolia '60mg'$  on or after : 12/30/22  Out-of-pocket cost due at time of visit: $327  Primary:  Prolia co-insurance: 20% Admin fee co-insurance: 20%  Secondary: --- Prolia co-insurance:  Admin fee co-insurance:   Medical Benefit Details: Date Benefits were checked: 12/26/22 Deductible: NO/ Coinsurance: 20%/ Admin Fee: 20%  Prior Auth: APPROVED PA# [id number]  Expiration Date: 10/20/23    Pharmacy benefit: Copay $95 If patient wants fill through the pharmacy benefit please send prescription to: [facility], and include estimated need by date in rx notes. Pharmacy will ship medication directly to the office.  Patient NOT eligible for Prolia Copay Card. Copay Card can make patient's cost as little as $25. Link to apply: [email or web address]  ** This summary of benefits is an estimation of the patient's out-of-pocket cost. Exact cost may very based on individual plan coverage.

## 2022-12-30 NOTE — Addendum Note (Signed)
Addended by: Earnstine Regal on: 12/30/2022 01:15 PM   Modules accepted: Orders

## 2022-12-30 NOTE — Telephone Encounter (Signed)
Patient Advocate Encounter  Prior Authorization for Prolia '60mg'$  has been approved.    PA# [id number] Effective dates: 10/20/21 through 10/20/23

## 2022-12-30 NOTE — Telephone Encounter (Signed)
Good morning! Wanted to make sure this note got back to you, looks like PT can get set up through the pharmacy and can have a lower co-pay due at the time of the visit.

## 2023-01-02 DIAGNOSIS — M1732 Unilateral post-traumatic osteoarthritis, left knee: Secondary | ICD-10-CM | POA: Diagnosis not present

## 2023-01-02 DIAGNOSIS — M25662 Stiffness of left knee, not elsewhere classified: Secondary | ICD-10-CM | POA: Diagnosis not present

## 2023-01-02 DIAGNOSIS — R262 Difficulty in walking, not elsewhere classified: Secondary | ICD-10-CM | POA: Diagnosis not present

## 2023-01-07 ENCOUNTER — Other Ambulatory Visit: Payer: Self-pay | Admitting: Internal Medicine

## 2023-01-07 ENCOUNTER — Other Ambulatory Visit: Payer: Self-pay | Admitting: Cardiology

## 2023-01-09 ENCOUNTER — Ambulatory Visit (INDEPENDENT_AMBULATORY_CARE_PROVIDER_SITE_OTHER): Payer: Medicare HMO

## 2023-01-09 DIAGNOSIS — M810 Age-related osteoporosis without current pathological fracture: Secondary | ICD-10-CM | POA: Diagnosis not present

## 2023-01-09 MED ORDER — DENOSUMAB 60 MG/ML ~~LOC~~ SOSY
60.0000 mg | PREFILLED_SYRINGE | Freq: Once | SUBCUTANEOUS | Status: AC
  Administered 2023-01-09: 60 mg via SUBCUTANEOUS

## 2023-01-09 NOTE — Progress Notes (Signed)
Pt was given Prolia injection w/o any complication.

## 2023-02-01 NOTE — Progress Notes (Unsigned)
Follow Up Note  RE: Crystal [name] MRN: [id number] DOB: [birth year] Date of Office Visit: 02/02/2023  Referring provider: Corwin Levins, MD Primary care provider: Corwin Levins, MD  Chief Complaint: No chief complaint on file.  History of Present Illness: I had the pleasure of seeing Crystal [name] for a follow up visit at the [facility] on 02/01/2023. She is a 72 y.o. female, who is being followed for COPD with asthma, allergic rhinitis, peripheral eosinophilia and chest tightness. Her previous allergy office visit was on 10/27/2022 with Dr. Selena Batten. Today is a regular follow up visit.  COPD with asthma [facility]) Past history - Was doing well until June and required oral prednisone and IM steroid. Breathing is not back to baseline. Quit smoking.  2023 spirometry showed 10% improvement in FEV1 post bronchodilator treatment. Clinically feeling improved. 2023 CT chest done. Interim history - well controlled with Breztri 2 puffs once a day.  Daily controller medication(s):  Continue Breztri 2 puffs 1-2 times a day with spacer and rinse mouth afterwards. During respiratory infections/flares:  Increase Breztri to 2 puffs twice a day for 1-2 weeks until your breathing symptoms return to baseline.  Pretreat with albuterol 2 puffs. May use albuterol rescue inhaler 2 puffs or nebulizer every 4 to 6 hours as needed for shortness of breath, chest tightness, coughing, and wheezing. May use albuterol rescue inhaler 2 puffs 5 to 15 minutes prior to strenuous physical activities. Monitor frequency of use.  Will get spirometry at next visit instead of today due to COVID-19 pandemic and trying to minimize any type of aerosolizing procedures at this time in the office.    Seasonal and perennial allergic rhinitis Past history - 2019 skin testing was positive to dust mites, grass, trees and borderline to mouse. [name] daily used caused too much dryness.  Interim history - uses Claritin prn with  good benefit.  Continue environmental control measures.  May use over the counter antihistamines such as Zyrtec (cetirizine), Claritin (loratadine), Allegra (fexofenadine), or Xyzal (levocetirizine) daily as needed. Nasal saline spray (i.e., Simply Saline) or nasal saline lavage (i.e., NeilMed) is recommended as needed and prior to medicated nasal sprays.   Peripheral eosinophilia Past history - 04/18/2022 eos elevated at 2100.  Interim history - last eos were only 800, work up by heme/onc unremarkable.  Follow up with heme/onc as scheduled.   Chest tightness Past history - Noted chest tightness almost daily. Denies reflux. No triggers noted. Interim history - only lasts a few seconds at a time.  Follow up with PCP/cardiology as well to rule out cardiac issues.   Return in about 3 months (around 01/26/2023).    Assessment and Plan: Almer is a 72 y.o. female with: No problem-specific Assessment & Plan notes found for this encounter.  No follow-ups on file.  No orders of the defined types were placed in this encounter.  Lab Orders  No laboratory test(s) ordered today    Diagnostics: Spirometry:  Tracings reviewed. Her effort: {Blank single:19197::"Good reproducible efforts.","It was hard to get consistent efforts and there is a question as to whether this reflects a maximal maneuver.","Poor effort, data can not be interpreted."} FVC: ***L FEV1: ***L, ***% predicted FEV1/FVC ratio: ***% Interpretation: {Blank single:19197::"Spirometry consistent with mild obstructive disease","Spirometry consistent with moderate obstructive disease","Spirometry consistent with severe obstructive disease","Spirometry consistent with possible restrictive disease","Spirometry consistent with mixed obstructive and restrictive disease","Spirometry uninterpretable due to technique","Spirometry consistent with normal pattern","No overt abnormalities noted given today's efforts"}.  Please see scanned  spirometry results for details.  Skin Testing: {Blank single:19197::"Select foods","Environmental allergy panel","Environmental allergy panel and select foods","Food allergy panel","None","Deferred due to recent antihistamines use"}. *** Results discussed with patient/family.   Medication List:  Current Outpatient Medications  Medication Sig Dispense Refill  . albuterol (VENTOLIN HFA) 108 (90 Base) MCG/ACT inhaler Inhale 2 puffs into the lungs every 4 (four) hours as needed for wheezing or shortness of breath (coughing fits). 18 g 1  . aspirin 81 MG EC tablet Take 1 tablet (81 mg total) by mouth daily. Swallow whole. 30 tablet 12  . atorvastatin (LIPITOR) 40 MG tablet TAKE 1 TABLET BY MOUTH EVERY DAY 90 tablet 3  . Budeson-Glycopyrrol-Formoterol (BREZTRI AEROSPHERE) 160-9-4.8 MCG/ACT AERO Inhale 2 puffs into the lungs in the morning and at bedtime. with spacer and rinse mouth afterwards. 10.7 g 5  . Calcium Carb-Cholecalciferol 600-800 MG-UNIT TABS Take 1 tablet by mouth in the morning and at bedtime.     . Cholecalciferol (VITAMIN D PO) Take 2,000 Units by mouth daily.     Marland Kitchen denosumab (PROLIA) 60 MG/ML SOSY injection Inject 60 mg into the skin every 6 (six) months. 180 mL 0  . folic acid (FOLVITE) 400 MCG tablet Take 400 mcg by mouth daily.    Marland Kitchen losartan (COZAAR) 50 MG tablet TAKE 1 TABLET EVERY DAY (ANNUAL APPOINTMENT DUE IN MAY, MUST SEE MD FOR FUTURE REFILLS) 90 tablet 3  . metoprolol succinate (TOPROL-XL) 25 MG 24 hr tablet TAKE 1 TABLET BY MOUTH EVERY DAY 90 tablet 3  . Olopatadine-Mometasone ([name]) [id number] MCG/ACT SUSP Place 1-2 sprays into the nose in the morning and at bedtime. 29 g 5  . tolterodine (DETROL LA) 4 MG 24 hr capsule Take 4 mg by mouth daily. (Patient not taking: Reported on 12/16/2022)     No current facility-administered medications for this visit.   Allergies: Allergies  Allergen Reactions  . Ace Inhibitors Cough  . Penicillins Rash   I reviewed her past  medical history, social history, family history, and environmental history and no significant changes have been reported from her previous visit.  Review of Systems  Constitutional:  Negative for appetite change, chills, fever and unexpected weight change.  HENT:  Negative for congestion and rhinorrhea.   Eyes:  Negative for itching.  Respiratory:  Positive for chest tightness. Negative for cough, shortness of breath and wheezing.   Gastrointestinal:  Negative for abdominal pain.  Skin:  Negative for rash.  Allergic/Immunologic: Positive for environmental allergies.  Neurological:  Negative for headaches.   Objective: There were no vitals taken for this visit. There is no height or weight on file to calculate BMI. Physical Exam Vitals and nursing note reviewed.  Constitutional:      Appearance: Normal appearance. She is well-developed.  HENT:     Head: Normocephalic and atraumatic.     Right Ear: External ear normal.     Left Ear: External ear normal.     Nose: Nose normal.     Mouth/Throat:     Mouth: Mucous membranes are moist.     Pharynx: Oropharynx is clear.  Eyes:     Conjunctiva/sclera: Conjunctivae normal.  Cardiovascular:     Rate and Rhythm: Normal rate and regular rhythm.     Heart sounds: Normal heart sounds. No murmur heard. Pulmonary:     Effort: Pulmonary effort is normal.     Breath sounds: Normal breath sounds. No wheezing, rhonchi or rales.  Musculoskeletal:  Cervical back: Neck supple.  Skin:    General: Skin is warm.     Findings: No rash.  Neurological:     Mental Status: She is alert and oriented to person, place, and time.  Psychiatric:        Behavior: Behavior normal.  Previous notes and tests were reviewed. The plan was reviewed with the patient/family, and all questions/concerned were addressed.  It was my pleasure to see Rayden today and participate in her care. Please feel free to contact me with any questions or  concerns.  Sincerely,  Wyline Mood, DO Allergy & Immunology  [facility] office: [phone] [facility] office: [phone]

## 2023-02-02 ENCOUNTER — Encounter: Payer: Self-pay | Admitting: Allergy

## 2023-02-02 ENCOUNTER — Other Ambulatory Visit: Payer: Self-pay

## 2023-02-02 ENCOUNTER — Ambulatory Visit: Payer: Medicare HMO | Admitting: Allergy

## 2023-02-02 VITALS — BP 132/82 | HR 84 | Temp 98.6°F | Resp 16 | Ht 63.0 in | Wt 202.0 lb

## 2023-02-02 DIAGNOSIS — J302 Other seasonal allergic rhinitis: Secondary | ICD-10-CM

## 2023-02-02 DIAGNOSIS — J4489 Other specified chronic obstructive pulmonary disease: Secondary | ICD-10-CM | POA: Diagnosis not present

## 2023-02-02 DIAGNOSIS — D7219 Other eosinophilia: Secondary | ICD-10-CM

## 2023-02-02 DIAGNOSIS — J3089 Other allergic rhinitis: Secondary | ICD-10-CM

## 2023-02-02 MED ORDER — BREZTRI AEROSPHERE 160-9-4.8 MCG/ACT IN AERO: 2.0000 | INHALATION_SPRAY | Freq: Two times a day (BID) | RESPIRATORY_TRACT | 5 refills | Status: DC

## 2023-02-02 NOTE — Patient Instructions (Addendum)
Breathing: Today's breathing test was normal.  Daily controller medication(s):  Continue Breztri 2 puffs 1-2 times a day with spacer and rinse mouth afterwards. During respiratory infections/flares:  Increase Breztri to 2 puffs twice a day for 1-2 weeks until your breathing symptoms return to baseline.  Pretreat with albuterol 2 puffs. May use albuterol rescue inhaler 2 puffs or nebulizer every 4 to 6 hours as needed for shortness of breath, chest tightness, coughing, and wheezing. May use albuterol rescue inhaler 2 puffs 5 to 15 minutes prior to strenuous physical activities. Monitor frequency of use.  Breathing control goals:  Full participation in all desired activities (may need albuterol before activity) Albuterol use two times or less a week on average (not counting use with activity) Cough interfering with sleep two times or less a month Oral steroids no more than once a year No hospitalizations   Eosinophils Follow up with heme/onc as scheduled.  Allergic rhinitis: 2019 skin testing was positive to dust mites, grass, trees and borderline to mouse. Continue environmental control measures.  May use over the counter antihistamines such as Zyrtec (cetirizine), Claritin (loratadine), Allegra (fexofenadine), or Xyzal (levocetirizine) daily as needed. Nasal saline spray (i.e., Simply Saline) or nasal saline lavage (i.e., NeilMed) is recommended as needed and prior to medicated nasal sprays.  Follow up in 4 months or sooner if needed.

## 2023-02-02 NOTE — Assessment & Plan Note (Signed)
Past history - 04/18/2022 eos elevated at 2100. Last eos were only 800, work up by heme/onc unremarkable.  Follow up with heme/onc as scheduled.

## 2023-02-02 NOTE — Assessment & Plan Note (Signed)
Past history - Was doing well until June and required oral prednisone and IM steroid. Breathing is not back to baseline. Quit smoking.  2023 spirometry showed 10% improvement in FEV1 post bronchodilator treatment. Clinically feeling improved. 2023 CT chest done. Interim history - well controlled and uses Breztri 2 puffs once a day but twice a day when she goes swimming. Normal spirometry today. Daily controller medication(s):  Continue Breztri 2 puffs 1-2 times a day with spacer and rinse mouth afterwards. During respiratory infections/flares:  Increase Breztri to 2 puffs twice a day for 1-2 weeks until your breathing symptoms return to baseline.  Pretreat with albuterol 2 puffs. May use albuterol rescue inhaler 2 puffs or nebulizer every 4 to 6 hours as needed for shortness of breath, chest tightness, coughing, and wheezing. May use albuterol rescue inhaler 2 puffs 5 to 15 minutes prior to strenuous physical activities. Monitor frequency of use.

## 2023-02-02 NOTE — Assessment & Plan Note (Signed)
Past history - 2019 skin testing was positive to dust mites, grass, trees and borderline to mouse. [name] daily used caused too much dryness.  Interim history - some PND but doesn't like nasal sprays.  Continue environmental control measures.  May use over the counter antihistamines such as Zyrtec (cetirizine), Claritin (loratadine), Allegra (fexofenadine), or Xyzal (levocetirizine) daily as needed. Nasal saline spray (i.e., Simply Saline) or nasal saline lavage (i.e., NeilMed) is recommended as needed and prior to medicated nasal sprays. Declined medicated nasal spray.

## 2023-02-03 NOTE — Patient Instructions (Signed)
SURGICAL WAITING ROOM VISITATION  Patients having surgery or a procedure may have no more than 2 support people in the waiting area - these visitors may rotate.    Children under the age of 78 must have an adult with them who is not the patient.  Due to an increase in RSV and influenza rates and associated hospitalizations, children ages 9 and under may not visit patients in [facility] hospitals.  If the patient needs to stay at the hospital during part of their recovery, the visitor guidelines for inpatient rooms apply. Pre-op nurse will coordinate an appropriate time for 1 support person to accompany patient in pre-op.  This support person may not rotate.    Please refer to the [facility] website for the visitor guidelines for Inpatients (after your surgery is over and you are in a regular room).       Your procedure is scheduled on: 02-17-23   Report to [facility] Main Entrance    Report to admitting at 5:15 AM   Call this number if you have problems the morning of surgery [phone]   Do not eat food :After Midnight.   After Midnight you may have the following liquids until 4:30 AM DAY OF SURGERY  Water Non-Citrus Juices (without pulp, NO RED-Apple, White grape, White cranberry) Black Coffee (NO MILK/CREAM OR CREAMERS, sugar ok)  Clear Tea (NO MILK/CREAM OR CREAMERS, sugar ok) regular and decaf                             Plain Jell-O (NO RED)                                           Fruit ices (not with fruit pulp, NO RED)                                     Popsicles (NO RED)                                                               Sports drinks like Gatorade (NO RED)                 The day of surgery:  Drink ONE (1) Pre-Surgery Clear Ensure at 4:30 AM the morning of surgery. Drink in one sitting. Do not sip.  This drink was given to you during your hospital  pre-op appointment visit. Nothing else to drink after completing the Pre-Surgery Clear  Ensure .          If you have questions, please contact your surgeon's office.   FOLLOW  ANY ADDITIONAL PRE OP INSTRUCTIONS YOU RECEIVED FROM YOUR SURGEON'S OFFICE!!!     Oral Hygiene is also important to reduce your risk of infection.                                    Remember - BRUSH YOUR TEETH THE MORNING OF SURGERY WITH YOUR REGULAR TOOTHPASTE  DENTURES  WILL BE REMOVED PRIOR TO SURGERY PLEASE DO NOT APPLY "Poly grip" OR ADHESIVES!!!   Do NOT smoke after Midnight   Take these medicines the morning of surgery with A SIP OF WATER:   Atorvastatin  Claritin  Metoprolol  Tolterodine  Bring CPAP mask and tubing day of surgery.                              You may not have any metal on your body including hair pins, jewelry, and body piercing             Do not wear make-up, lotions, powders, perfumes or deodorant  Do not wear nail polish including gel and S&S, artificial/acrylic nails, or any other type of covering on natural nails including finger and toenails. If you have artificial nails, gel coating, etc. that needs to be removed by a nail salon please have this removed prior to surgery or surgery may need to be canceled/ delayed if the surgeon/ anesthesia feels like they are unable to be safely monitored.   Do not shave  48 hours prior to surgery.    Do not bring valuables to the hospital. [facility] IS NOT RESPONSIBLE   FOR VALUABLES.   Contacts, glasses, dentures or bridgework may not be worn into surgery.   Bring small overnight bag day of surgery.   DO NOT BRING YOUR HOME MEDICATIONS TO THE HOSPITAL. PHARMACY WILL DISPENSE MEDICATIONS LISTED ON YOUR MEDICATION LIST TO YOU DURING YOUR ADMISSION IN THE HOSPITAL!   Special Instructions: Bring a copy of your healthcare power of attorney and living will documents the day of surgery if you haven't scanned them before.              Please read over the following fact sheets you were given: IF YOU HAVE QUESTIONS ABOUT YOUR  PRE-OP INSTRUCTIONS PLEASE CALL [phone] [facility]   If you received a COVID test during your pre-op visit  it is requested that you wear a mask when out in public, stay away from anyone that may not be feeling well and notify your surgeon if you develop symptoms. If you test positive for Covid or have been in contact with anyone that has tested positive in the last 10 days please notify you surgeon.      Incentive Spirometer  An incentive spirometer is a tool that can help keep your lungs clear and active. This tool measures how well you are filling your lungs with each breath. Taking long deep breaths may help reverse or decrease the chance of developing breathing (pulmonary) problems (especially infection) following: A long period of time when you are unable to move or be active. BEFORE THE PROCEDURE  If the spirometer includes an indicator to show your best effort, your nurse or respiratory therapist will set it to a desired goal. If possible, sit up straight or lean slightly forward. Try not to slouch. Hold the incentive spirometer in an upright position. INSTRUCTIONS FOR USE  Sit on the edge of your bed if possible, or sit up as far as you can in bed or on a chair. Hold the incentive spirometer in an upright position. Breathe out normally. Place the mouthpiece in your mouth and seal your lips tightly around it. Breathe in slowly and as deeply as possible, raising the piston or the ball toward the top of the column. Hold your breath for 3-5 seconds or for as long as  possible. Allow the piston or ball to fall to the bottom of the column. Remove the mouthpiece from your mouth and breathe out normally. Rest for a few seconds and repeat Steps 1 through 7 at least 10 times every 1-2 hours when you are awake. Take your time and take a few normal breaths between deep breaths. The spirometer may include an indicator to show your best effort. Use the indicator as a goal to work toward during each  repetition. After each set of 10 deep breaths, practice coughing to be sure your lungs are clear. If you have an incision (the cut made at the time of surgery), support your incision when coughing by placing a pillow or rolled up towels firmly against it. Once you are able to get out of bed, walk around indoors and cough well. You may stop using the incentive spirometer when instructed by your caregiver.  RISKS AND COMPLICATIONS Take your time so you do not get dizzy or light-headed. If you are in pain, you may need to take or ask for pain medication before doing incentive spirometry. It is harder to take a deep breath if you are having pain. AFTER USE Rest and breathe slowly and easily. It can be helpful to keep track of a log of your progress. Your caregiver can provide you with a simple table to help with this. If you are using the spirometer at home, follow these instructions: SEEK MEDICAL CARE IF:  You are having difficultly using the spirometer. You have trouble using the spirometer as often as instructed. Your pain medication is not giving enough relief while using the spirometer. You develop fever of 100.5 F (38.1 C) or higher. SEEK IMMEDIATE MEDICAL CARE IF:  You cough up bloody sputum that had not been present before. You develop fever of 102 F (38.9 C) or greater. You develop worsening pain at or near the incision site. MAKE SURE YOU:  Understand these instructions. Will watch your condition. Will get help right away if you are not doing well or get worse. Document Released: 02/16/2007 Document Revised: 12/29/2011 Document Reviewed: 04/19/2007 [facility] Patient Information 2014 [place], [place].   ________________________________________________________________________

## 2023-02-04 NOTE — Progress Notes (Signed)
COVID Vaccine Completed:  Yes  Date of COVID positive in last 90 days:  PCP - Oliver Barre, MD Cardiologist - Bryan Lemma, MD Pulmonologist - Fannie Knee, MD  Pulmonary clearance in Epic dated 12-16-22 by Dr. Maple Hudson  Chest x-ray - 02-05-23 Epic EKG - 11-27-22 Epic Stress Test -  ECHO -  Cardiac Cath -  Pacemaker/ICD device last checked: Spinal Cord Stimulator: Cardiac CT - 12-26-21 Epic  Bowel Prep -   Sleep Study -  Yes, +sleep apnea CPAP - Yes  Fasting Blood Sugar -  Checks Blood Sugar _____ times a day  Last dose of GLP1 agonist-  N/A GLP1 instructions:  N/A   Last dose of SGLT-2 inhibitors-  N/A SGLT-2 instructions: N/A   Blood Thinner Instructions:  Time Aspirin Instructions: Last Dose:  Activity level:  Can go up a flight of stairs and perform activities of daily living without stopping and without symptoms of chest pain or shortness of breath.  Able to exercise without symptoms  Unable to go up a flight of stairs without symptoms of     Anesthesia review:  elevated calcium score, CAD, COPD, OSA, HTN  Patient denies shortness of breath, fever, cough and chest pain at PAT appointment  Patient verbalized understanding of instructions that were given to them at the PAT appointment. Patient was also instructed that they will need to review over the PAT instructions again at home before surgery.

## 2023-02-05 ENCOUNTER — Encounter (HOSPITAL_COMMUNITY): Payer: Self-pay

## 2023-02-05 ENCOUNTER — Ambulatory Visit (HOSPITAL_COMMUNITY)
Admission: RE | Admit: 2023-02-05 | Discharge: 2023-02-05 | Disposition: A | Discharge status: Home / Self Care | Payer: Medicare HMO | Source: Ambulatory Visit | Attending: Orthopaedic Surgery | Admitting: Orthopaedic Surgery

## 2023-02-05 ENCOUNTER — Encounter (HOSPITAL_COMMUNITY)
Admission: RE | Admit: 2023-02-05 | Discharge: 2023-02-05 | Disposition: A | Discharge status: Home / Self Care | Payer: Medicare HMO | Source: Ambulatory Visit | Attending: Orthopaedic Surgery | Admitting: Orthopaedic Surgery

## 2023-02-05 ENCOUNTER — Other Ambulatory Visit: Payer: Self-pay

## 2023-02-05 VITALS — BP 153/86 | HR 65 | Temp 98.3°F | Resp 16 | Ht 63.0 in | Wt 200.0 lb

## 2023-02-05 DIAGNOSIS — Z87891 Personal history of nicotine dependence: Secondary | ICD-10-CM | POA: Diagnosis not present

## 2023-02-05 DIAGNOSIS — Z01812 Encounter for preprocedural laboratory examination: Secondary | ICD-10-CM | POA: Diagnosis not present

## 2023-02-05 DIAGNOSIS — G4730 Sleep apnea, unspecified: Secondary | ICD-10-CM | POA: Insufficient documentation

## 2023-02-05 DIAGNOSIS — M1712 Unilateral primary osteoarthritis, left knee: Secondary | ICD-10-CM | POA: Diagnosis not present

## 2023-02-05 DIAGNOSIS — K769 Liver disease, unspecified: Secondary | ICD-10-CM | POA: Insufficient documentation

## 2023-02-05 DIAGNOSIS — I10 Essential (primary) hypertension: Secondary | ICD-10-CM | POA: Insufficient documentation

## 2023-02-05 DIAGNOSIS — Z01818 Encounter for other preprocedural examination: Secondary | ICD-10-CM | POA: Diagnosis not present

## 2023-02-05 DIAGNOSIS — E785 Hyperlipidemia, unspecified: Secondary | ICD-10-CM | POA: Diagnosis not present

## 2023-02-05 HISTORY — DX: Dyspnea, unspecified: R06.00

## 2023-02-05 LAB — COMPREHENSIVE METABOLIC PANEL
ALT: 24 U/L (ref 0–44)
AST: 27 U/L (ref 15–41)
Albumin: 4.4 g/dL (ref 3.5–5.0)
Alkaline Phosphatase: 64 U/L (ref 38–126)
Anion gap: 11 (ref 5–15)
BUN: 20 mg/dL (ref 8–23)
CO2: 25 mmol/L (ref 22–32)
Calcium: 9.6 mg/dL (ref 8.9–10.3)
Chloride: 103 mmol/L (ref 98–111)
Creatinine, Ser: 0.69 mg/dL (ref 0.44–1.00)
GFR, Estimated: >60 mL/min (ref >60)
Glucose, Bld: 92 mg/dL (ref 70–99)
Potassium: 4.5 mmol/L (ref 3.5–5.1)
Sodium: 139 mmol/L (ref 135–145)
Total Bilirubin: 0.9 mg/dL (ref 0.3–1.2)
Total Protein: 8 g/dL (ref 6.5–8.1)

## 2023-02-05 LAB — CBC
HCT: 42.9 % (ref 36.0–46.0)
Hemoglobin: 14 g/dL (ref 12.0–15.0)
MCH: 31.7 pg (ref 26.0–34.0)
MCHC: 32.6 g/dL (ref 30.0–36.0)
MCV: 97.1 fL (ref 80.0–100.0)
Platelets: 263 10*3/uL (ref 150–400)
RBC: 4.42 MIL/uL (ref 3.87–5.11)
RDW: 13.1 % (ref 11.5–15.5)
WBC: 7 10*3/uL (ref 4.0–10.5)
nRBC: 0 % (ref 0.0–0.2)

## 2023-02-05 LAB — SURGICAL PCR SCREEN
MRSA, PCR: NEGATIVE
Staphylococcus aureus: NEGATIVE

## 2023-02-09 DIAGNOSIS — M1611 Unilateral primary osteoarthritis, right hip: Secondary | ICD-10-CM | POA: Diagnosis not present

## 2023-02-09 NOTE — Progress Notes (Signed)
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 Marcene Corning.  Patient was seen by pulmonologist 12/16/2022.  Per office visit note, "She is clear from a pulmonary/sleep medicine standpoint to go forward with the planned surgery"  Patient follows with cardiology, elevated calcium score, hypertension, hyperlipidemia.  Last seen 11/27/2022.  Per office visit note, "Awaiting scheduling for left knee arthoplasty with Dr. Edwena Blow.  Chart reviewed as part of pre-operative protocol coverage. Patient does not have a history of ischemic heart disease, PCI, heart failure, or stroke. According to the RCRI, patient has a 0.4% risk of MACE. Patient reports activity equivalent to 4.0 METS (Water aerobics three times a week).  Given past medical history and time since last visit, based on ACC/AHA guidelines, Crystal [name] would be at acceptable risk for the planned procedure without further cardiovascular testing."  Anticipate pt can proceed with planned procedure barring acute status change.   VS: BP (!) 153/86   Pulse 65   Temp 36.8 C (Oral)   Resp 16   Ht  (1.6 m)   Wt 90.7 kg   SpO2 100%   BMI 35.43 kg/m   PROVIDERS: Corwin Levins, MD is PCP  Bryan Lemma, MD is cardiologist LABS: Labs reviewed: Acceptable for surgery. (all labs ordered are listed, but only abnormal results are displayed)  Labs Reviewed  SURGICAL PCR SCREEN  COMPREHENSIVE METABOLIC PANEL  CBC     IMAGES:   EKG:   CV:  Past Medical History:  Diagnosis Date   ALLERGIC RHINITIS    Allergy    ANXIETY    no medations needed    Arthritis    Asthma    Bronchitis    Cataract    no surgery   COLONIC POLYPS, HX OF    Complication of anesthesia    oxygen sat dropped during endoscopy    COPD    pt unsure of this dx   Cough    DEPRESSION    Dyspnea    Elevated coronary artery calcium score 12/2021   Agatston score 173-all LAD   Fatty liver    GERD    GLUCOSE INTOLERANCE    HYPERTENSION    IBS    Impaired glucose tolerance    Obstructive sleep apnea 06/25/2018   OSTEOPENIA    Osteopenia    Osteoporosis 07/30/2017   PERIPHERAL EDEMA    Pneumonia    Pneumonia, organism unspecified(486)    Sleep apnea    Oral Aparatus   TOBACCO USE DISORDER/SMOKER-SMOKING CESSATION DISCUSSED     Past Surgical History:  Procedure Laterality Date   birthmark removed     from back as a child   bladder mesh     2012   CESAREAN SECTION     x2   CHOLECYSTECTOMY     COLONOSCOPY  2018   TA   MOUTH SURGERY  04/25/2020   tissue graft -room of mouth to front gums   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   Procedure: LEFT TOTAL HIP ARTHROPLASTY ANTERIOR APPROACH;  Surgeon: Marcene Corning, MD;  Location: [facility];  Service: Orthopedics;  Laterality: Left;   WISDOM TOOTH EXTRACTION      MEDICATIONS:  albuterol (VENTOLIN HFA) 108 (90 Base) MCG/ACT inhaler   aspirin 81 MG EC tablet   atorvastatin (LIPITOR) 40 MG tablet   Budeson-Glycopyrrol-Formoterol (BREZTRI AEROSPHERE) 160-9-4.8 MCG/ACT AERO   Calcium Carb-Cholecalciferol 600-800 MG-UNIT TABS   Cholecalciferol (VITAMIN D PO)   CRANBERRY PO   denosumab (PROLIA) 60 MG/ML SOSY injection   folic acid (FOLVITE) 400 MCG tablet   loratadine (CLARITIN) 10 MG tablet   losartan (COZAAR) 50 MG tablet   metoprolol succinate (TOPROL-XL) 25 MG 24 hr tablet   NON FORMULARY   tolterodine (DETROL LA) 4 MG 24 hr capsule   No current facility-administered medications for this encounter.     Crystal Cipro Ward, PA-C [facility] [phone]

## 2023-02-09 NOTE — Anesthesia Preprocedure Evaluation (Addendum)
Anesthesia Evaluation  Patient identified by MRN, date of birth, ID band Patient awake    Reviewed: Allergy & Precautions, NPO status , Patient's Chart, lab work & pertinent test results  Airway Mallampati: II  TM Distance: >3 FB Neck ROM: Full    Dental  (+) Dental Advisory Given   Pulmonary shortness of breath, asthma , sleep apnea , COPD, Patient abstained from smoking., former smoker   Pulmonary exam normal breath sounds clear to auscultation       Cardiovascular hypertension, Pt. on medications + CAD  Normal cardiovascular exam Rhythm:Regular Rate:Normal  CT Coronary Ca 12/2021 Coronary calcium score of 173 (LAD). This was 78th percentile for age-, race-, and sex-matched controls. Aortic atherosclerosis.    Neuro/Psych  PSYCHIATRIC DISORDERS Anxiety Depression    negative neurological ROS     GI/Hepatic Neg liver ROS,GERD  ,,  Endo/Other  negative endocrine ROS    Renal/GU K+ 5.2 Cr 0.52     Musculoskeletal  (+) Arthritis ,    Abdominal  (+) + obese  Peds  Hematology Hgb 14.0 Plt 273   Anesthesia Other Findings   Reproductive/Obstetrics                             Anesthesia Physical Anesthesia Plan  ASA: 3  Anesthesia Plan: Spinal   Post-op Pain Management: Regional block* and Tylenol PO (pre-op)*   Induction:   PONV Risk Score and Plan: 3 and Ondansetron, Propofol infusion, Dexamethasone and Treatment may vary due to age or medical condition  Airway Management Planned: Natural Airway  Additional Equipment: None  Intra-op Plan:   Post-operative Plan:   Informed Consent: I have reviewed the patients History and Physical, chart, labs and discussed the procedure including the risks, benefits and alternatives for the proposed anesthesia with the patient or authorized representative who has indicated his/her understanding and acceptance.     Dental advisory  given  Plan Discussed with: CRNA  Anesthesia Plan Comments: (See PAT note 02/05/2023)        Anesthesia Quick Evaluation

## 2023-02-10 NOTE — Care Plan (Signed)
Ortho Bundle Case Management Note  Patient Details  Name: Crystal [name] MRN: [id number] Date of Birth: 18-Apr-1951  spoke with patient. she will discharge to home with family and friends to asisst. has all needed equipment. OPPT set up with [facility] [facility] discharge instructions discussed and questions answered. appointments confirmed. packet mailed. patient and MD in agreement with plan. choice offered    DME Arranged:    DME Agency:     HH Arranged:    HH Agency:     Additional Comments: Please contact me with any questions of if this plan should need to change.  Shauna Hugh,  RN,BSN,MHA,CCM  [facility]  [phone] 02/10/2023, 4:53 PM

## 2023-02-12 NOTE — H&P (Signed)
TOTAL KNEE ADMISSION H&P  Patient is being admitted for left total knee arthroplasty.  Subjective:  Chief Complaint:left knee pain.  HPI: Crystal [name], 72 y.o. female, has a history of pain and functional disability in the left knee due to arthritis and has failed non-surgical conservative treatments for greater than 12 weeks to includeNSAID's and/or analgesics, corticosteriod injections, viscosupplementation injections, flexibility and strengthening excercises, supervised PT with diminished ADL's post treatment, use of assistive devices, weight reduction as appropriate, and activity modification.  Onset of symptoms was gradual, starting 5 years ago with gradually worsening course since that time. The patient noted no past surgery on the left knee(s).  Patient currently rates pain in the left knee(s) at 10 out of 10 with activity. Patient has night pain, worsening of pain with activity and weight bearing, pain that interferes with activities of daily living, crepitus, and joint swelling.  Patient has evidence of subchondral cysts, subchondral sclerosis, periarticular osteophytes, and joint space narrowing by imaging studies. There is no active infection.  Patient Active Problem List   Diagnosis Date Noted   Preoperative clearance 12/16/2022   Skin lesion 11/20/2022   Coronary artery disease due to lipid rich plaque 11/20/2022   Herpes simplex 10/22/2022   Arthritis of right hip 10/03/2022   Clonal hematopoiesis of indeterminate potential (CHIP) 08/01/2022   Chest tightness 07/23/2022   Eosinophilia 06/27/2022   COPD with asthma 05/26/2022   Peripheral eosinophilia 05/26/2022   COPD exacerbation 04/24/2022   Hyperlipidemia with target LDL less than 70 11/17/2021   Elevated coronary artery calcium score-173 11/13/2021   Aortic atherosclerosis 11/13/2021   Right flank pain 03/13/2021   B12 deficiency 03/13/2021   Vitamin D deficiency 03/13/2021   Primary localized osteoarthritis of  left hip 09/11/2020   Primary osteoarthritis of left hip 09/11/2020   Tonsillitis 05/09/2020   Arthritis of left hip 04/04/2020   Greater trochanteric bursitis of left hip 02/27/2020   Benign head tremor 02/25/2020   Seasonal and perennial allergic rhinitis 07/08/2018   Vasomotor rhinitis 07/08/2018   Allergic conjunctivitis 07/08/2018   Bronchitis, recurrent (HCC) 06/28/2018   Obstructive sleep apnea 06/25/2018   Chronic serous otitis media 03/08/2018   Snoring 03/08/2018   Urinary incontinence 01/23/2018   Fatty liver 10/19/2017   Osteoporosis 07/30/2017   Dysphagia 12/12/2016   Breast pain 12/12/2016   Genetic testing 03/18/2016   Degenerative arthritis of knee, bilateral 02/28/2016   Degenerative arthritis of left knee 02/27/2016   Colon adenomas 02/22/2016   Family history of breast cancer in sister 02/22/2016   Patellofemoral arthritis 09/03/2015   Patellofemoral arthritis of left knee 07/24/2015   Bilateral anterior knee pain 06/03/2012   Family history of colon cancer 06/03/2012   Impaired glucose tolerance 03/27/2011   Encounter for well adult exam with abnormal findings 03/27/2011   Anxiety state 12/20/2009   Depression 12/20/2009   Essential hypertension 12/20/2009   GERD 12/20/2009   IBS 12/20/2009   Disorder of bone and cartilage 12/20/2009   PERIPHERAL EDEMA 12/20/2009   COLONIC POLYPS, HX OF 12/20/2009   Former smoker 12/13/2007   COPD mixed type 12/13/2007   Past Medical History:  Diagnosis Date   ALLERGIC RHINITIS    Allergy    ANXIETY    no medations needed   Arthritis    Asthma    Bronchitis    Cataract    no surgery   COLONIC POLYPS, HX OF    Complication of anesthesia    oxygen sat dropped during endoscopy  COPD    pt unsure of this dx   Cough    DEPRESSION    Dyspnea    Elevated coronary artery calcium score 12/2021   Agatston score 173-all LAD   Fatty liver    GERD    GLUCOSE INTOLERANCE    HYPERTENSION    IBS    Impaired  glucose tolerance    Obstructive sleep apnea 06/25/2018   OSTEOPENIA    Osteopenia    Osteoporosis 07/30/2017   PERIPHERAL EDEMA    Pneumonia    Pneumonia, organism unspecified(486)    Sleep apnea    Oral Aparatus   TOBACCO USE DISORDER/SMOKER-SMOKING CESSATION DISCUSSED     Past Surgical History:  Procedure Laterality Date   birthmark removed     from back as a child   bladder mesh     2012   CESAREAN SECTION     x2   CHOLECYSTECTOMY     COLONOSCOPY  2018   TA   MOUTH SURGERY  04/25/2020   tissue graft -room of mouth to front gums   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   Procedure: LEFT TOTAL HIP ARTHROPLASTY ANTERIOR APPROACH;  Surgeon: Marcene Corning, MD;  Location: [facility];  Service: Orthopedics;  Laterality: Left;   WISDOM TOOTH EXTRACTION      No current facility-administered medications for this encounter.   Current Outpatient Medications  Medication Sig Dispense Refill Last Dose   albuterol (VENTOLIN HFA) 108 (90 Base) MCG/ACT inhaler Inhale 2 puffs into the lungs every 4 (four) hours as needed for wheezing or shortness of breath (coughing fits). 18 g 1    aspirin 81 MG EC tablet Take 1 tablet (81 mg total) by mouth daily. Swallow whole. 30 tablet 12    atorvastatin (LIPITOR) 40 MG tablet TAKE 1 TABLET BY MOUTH EVERY DAY 90 tablet 3    Budeson-Glycopyrrol-Formoterol (BREZTRI AEROSPHERE) 160-9-4.8 MCG/ACT AERO Inhale 2 puffs into the lungs in the morning and at bedtime. with spacer and rinse mouth afterwards. 10.7 g 5    Calcium Carb-Cholecalciferol 600-800 MG-UNIT TABS Take 1 tablet by mouth in the morning and at bedtime.       Cholecalciferol (VITAMIN D PO) Take 2,000 Units by mouth daily.       CRANBERRY PO Take 1 tablet by mouth 3 (three) times a week.      denosumab (PROLIA) 60 MG/ML SOSY injection Inject 60 mg into the skin every 6 (six) months. 180 mL 0    folic acid (FOLVITE) 400 MCG tablet Take 400 mcg by mouth daily.      loratadine (CLARITIN) 10 MG tablet  Take 10 mg by mouth daily.      losartan (COZAAR) 50 MG tablet TAKE 1 TABLET EVERY DAY (ANNUAL APPOINTMENT DUE IN MAY, MUST SEE MD FOR FUTURE REFILLS) 90 tablet 3    metoprolol succinate (TOPROL-XL) 25 MG 24 hr tablet TAKE 1 TABLET BY MOUTH EVERY DAY 90 tablet 3    NON FORMULARY Pt uses a cpap nightly      tolterodine (DETROL LA) 4 MG 24 hr capsule Take 4 mg by mouth daily.      Allergies  Allergen Reactions   Tape     Removed a layer of skin when removed, please use paper tape   Ace Inhibitors Cough   Penicillins Rash    Social History   Tobacco Use   Smoking status: Former    Packs/day: 1.00    Years: 42.00    Additional pack  years: 0.00    Total pack years: 42.00    Types: Cigarettes    Start date: 10/20/1977    Quit date: 10/13/2022    Years since quitting: 0.3   Smokeless tobacco: Never  Substance Use Topics   Alcohol use: Yes    Alcohol/week: 21.0 standard drinks of alcohol    Types: 7 Glasses of wine, 14 Standard drinks or equivalent per week    Comment: couple of drinks 1-2 nites per week     Family History  Problem Relation Age of Onset   Breast cancer Sister 70       +lump and tamoxifen; eventually had bilateral mastectomies   Allergic rhinitis Sister    Colon cancer Father        dx. >34   Bladder Cancer Father        dx. 23-79; not a smoker   Skin cancer Father        basal cell carcinoma   Colon polyps Father    Colon cancer Paternal Grandfather        unspecified age   Melanoma Paternal Grandfather        unspecified age   Skin cancer Mother        NOS; dx. 64s   Other Mother        hx of hysterectomy in her early 29s for fibroid cysts   Heart attack Maternal Uncle 67   Heart attack Maternal Grandmother 50   Thyroid cancer Paternal Grandmother 25   Breast cancer Sister        dx. early 4s; s/p BL mastectomies   Cervical cancer Sister        dx. late 35s   Skin cancer Brother        NOS   Anxiety disorder Other    Hypothyroidism Other     Hyperlipidemia Other    Diabetes Other    Esophageal cancer Neg Hx    Stomach cancer Neg Hx    Rectal cancer Neg Hx      Review of Systems  Musculoskeletal:  Positive for arthralgias.       Left knee  All other systems reviewed and are negative.   Objective:  Physical Exam Constitutional:      Appearance: Normal appearance.  HENT:     Head: Normocephalic and atraumatic.     Nose: Nose normal.     Mouth/Throat:     Pharynx: Oropharynx is clear.  Eyes:     Extraocular Movements: Extraocular movements intact.  Pulmonary:     Effort: Pulmonary effort is normal.  Abdominal:     Palpations: Abdomen is soft.  Musculoskeletal:     Cervical back: Normal range of motion.     Comments: Left knee motion is about 0-120.  She has lateral and medial joint line pain with crepitation and trace effusion.  Ligaments are stable.  She has a small palpable Baker cyst.  Hip motion is excellent on this side and straight leg raise is negative.  Sensation and motor function are intact distally and she has palpable pulses in her feet.    Skin:    General: Skin is warm and dry.  Neurological:     General: No focal deficit present.     Mental Status: She is alert and oriented to person, place, and time. Mental status is at baseline.  Psychiatric:        Mood and Affect: Mood normal.        Behavior: Behavior normal.  Thought Content: Thought content normal.        Judgment: Judgment normal.     Vital signs in last 24 hours:    Labs:   Estimated body mass index is 35.43 kg/m as calculated from the following:   Height as of 02/05/23: 5\' 3"  (1.6 m).   Weight as of 02/05/23: 90.7 kg.   Imaging Review Plain radiographs demonstrate severe degenerative joint disease of the left knee(s). The overall alignment isneutral. The bone quality appears to be good for age and reported activity level.      Assessment/Plan:  End stage primary arthritis, left knee   The patient history,  physical examination, clinical judgment of the provider and imaging studies are consistent with end stage degenerative joint disease of the left knee(s) and total knee arthroplasty is deemed medically necessary. The treatment options including medical management, injection therapy arthroscopy and arthroplasty were discussed at length. The risks and benefits of total knee arthroplasty were presented and reviewed. The risks due to aseptic loosening, infection, stiffness, patella tracking problems, thromboembolic complications and other imponderables were discussed. The patient acknowledged the explanation, agreed to proceed with the plan and consent was signed. Patient is being admitted for inpatient treatment for surgery, pain control, PT, OT, prophylactic antibiotics, VTE prophylaxis, progressive ambulation and ADL's and discharge planning. The patient is planning to be discharged home with home health services   Patient's anticipated LOS is less than 2 midnights, meeting these requirements: - Younger than 22 - Lives within 1 hour of care - Has a competent adult at home to recover with post-op recover - NO history of  - Chronic pain requiring opiods  - Diabetes  - Coronary Artery Disease  - Heart failure  - Heart attack  - Stroke  - DVT/VTE  - Cardiac arrhythmia  - Respiratory Failure/COPD  - Renal failure  - Anemia  - Advanced Liver disease

## 2023-02-16 MED ORDER — TRANEXAMIC ACID 1000 MG/10ML IV SOLN
2000.0000 mg | INTRAVENOUS | Status: DC
  Filled 2023-02-16: qty 20

## 2023-02-17 ENCOUNTER — Ambulatory Visit (HOSPITAL_COMMUNITY): Payer: Medicare HMO | Admitting: Physician Assistant

## 2023-02-17 ENCOUNTER — Encounter (HOSPITAL_COMMUNITY): Payer: Self-pay | Admitting: Orthopaedic Surgery

## 2023-02-17 ENCOUNTER — Other Ambulatory Visit: Payer: Self-pay

## 2023-02-17 ENCOUNTER — Encounter (HOSPITAL_COMMUNITY)
Admission: RE | Disposition: A | Discharge status: Home / Self Care | Payer: Self-pay | Source: Ambulatory Visit | Attending: Orthopaedic Surgery

## 2023-02-17 ENCOUNTER — Observation Stay (HOSPITAL_COMMUNITY)
Admission: RE | Admit: 2023-02-17 | Discharge: 2023-02-18 | Disposition: A | Discharge status: Home / Self Care | Payer: Medicare HMO | Source: Ambulatory Visit | Attending: Orthopaedic Surgery | Admitting: Orthopaedic Surgery

## 2023-02-17 ENCOUNTER — Ambulatory Visit (HOSPITAL_BASED_OUTPATIENT_CLINIC_OR_DEPARTMENT_OTHER): Payer: Medicare HMO | Admitting: Anesthesiology

## 2023-02-17 DIAGNOSIS — J449 Chronic obstructive pulmonary disease, unspecified: Secondary | ICD-10-CM | POA: Diagnosis not present

## 2023-02-17 DIAGNOSIS — I10 Essential (primary) hypertension: Secondary | ICD-10-CM

## 2023-02-17 DIAGNOSIS — G4730 Sleep apnea, unspecified: Secondary | ICD-10-CM | POA: Diagnosis not present

## 2023-02-17 DIAGNOSIS — Z79899 Other long term (current) drug therapy: Secondary | ICD-10-CM | POA: Insufficient documentation

## 2023-02-17 DIAGNOSIS — Z96642 Presence of left artificial hip joint: Secondary | ICD-10-CM | POA: Diagnosis not present

## 2023-02-17 DIAGNOSIS — I2510 Atherosclerotic heart disease of native coronary artery without angina pectoris: Secondary | ICD-10-CM | POA: Insufficient documentation

## 2023-02-17 DIAGNOSIS — J45909 Unspecified asthma, uncomplicated: Secondary | ICD-10-CM | POA: Insufficient documentation

## 2023-02-17 DIAGNOSIS — Z96652 Presence of left artificial knee joint: Secondary | ICD-10-CM | POA: Diagnosis not present

## 2023-02-17 DIAGNOSIS — Z87891 Personal history of nicotine dependence: Secondary | ICD-10-CM | POA: Diagnosis not present

## 2023-02-17 DIAGNOSIS — G8918 Other acute postprocedural pain: Secondary | ICD-10-CM | POA: Diagnosis not present

## 2023-02-17 DIAGNOSIS — Z7982 Long term (current) use of aspirin: Secondary | ICD-10-CM | POA: Insufficient documentation

## 2023-02-17 DIAGNOSIS — M1712 Unilateral primary osteoarthritis, left knee: Secondary | ICD-10-CM | POA: Diagnosis not present

## 2023-02-17 HISTORY — PX: TOTAL KNEE ARTHROPLASTY: SHX125

## 2023-02-17 SURGERY — ARTHROPLASTY, KNEE, TOTAL
Anesthesia: Spinal | Site: Knee | Laterality: Left

## 2023-02-17 MED ORDER — MENTHOL 3 MG MT LOZG: 1.0000 | LOZENGE | OROMUCOSAL | Status: DC | PRN

## 2023-02-17 MED ORDER — LACTATED RINGERS IV SOLN: INTRAVENOUS | Status: DC

## 2023-02-17 MED ORDER — METOPROLOL SUCCINATE ER 25 MG PO TB24
25.0000 mg | ORAL_TABLET | Freq: Every day | ORAL | Status: DC
  Administered 2023-02-18: 25 mg via ORAL
  Filled 2023-02-17: qty 1

## 2023-02-17 MED ORDER — 0.9 % SODIUM CHLORIDE (POUR BTL) OPTIME
TOPICAL | Status: DC | PRN
  Administered 2023-02-17: 1000 mL

## 2023-02-17 MED ORDER — BUDESON-GLYCOPYRROL-FORMOTEROL 160-9-4.8 MCG/ACT IN AERO: 2.0000 | INHALATION_SPRAY | Freq: Two times a day (BID) | RESPIRATORY_TRACT | Status: DC

## 2023-02-17 MED ORDER — VANCOMYCIN HCL IN DEXTROSE 1-5 GM/200ML-% IV SOLN
1000.0000 mg | INTRAVENOUS | Status: DC
  Filled 2023-02-17: qty 200

## 2023-02-17 MED ORDER — CLONIDINE HCL (ANALGESIA) 100 MCG/ML EP SOLN
EPIDURAL | Status: DC | PRN
  Administered 2023-02-17: 80 ug

## 2023-02-17 MED ORDER — PHENOL 1.4 % MT LIQD: 1.0000 | OROMUCOSAL | Status: DC | PRN

## 2023-02-17 MED ORDER — PROPOFOL 500 MG/50ML IV EMUL
INTRAVENOUS | Status: DC | PRN
  Administered 2023-02-17: 40 ug/kg/min via INTRAVENOUS

## 2023-02-17 MED ORDER — LOSARTAN POTASSIUM 50 MG PO TABS
50.0000 mg | ORAL_TABLET | Freq: Every day | ORAL | Status: DC
  Administered 2023-02-17: 50 mg via ORAL
  Filled 2023-02-17: qty 1

## 2023-02-17 MED ORDER — TRANEXAMIC ACID-NACL 1000-0.7 MG/100ML-% IV SOLN
1000.0000 mg | INTRAVENOUS | Status: AC
  Administered 2023-02-17: 1000 mg via INTRAVENOUS
  Filled 2023-02-17: qty 100

## 2023-02-17 MED ORDER — HYDROCODONE-ACETAMINOPHEN 5-325 MG PO TABS
1.0000 | ORAL_TABLET | ORAL | Status: DC | PRN
  Administered 2023-02-18: 1 via ORAL
  Filled 2023-02-17: qty 1

## 2023-02-17 MED ORDER — MIDAZOLAM HCL 5 MG/5ML IJ SOLN
INTRAMUSCULAR | Status: DC | PRN
  Administered 2023-02-17: 2 mg via INTRAVENOUS

## 2023-02-17 MED ORDER — ONDANSETRON HCL 4 MG PO TABS: 4.0000 mg | ORAL_TABLET | Freq: Four times a day (QID) | ORAL | Status: DC | PRN

## 2023-02-17 MED ORDER — SODIUM CHLORIDE (PF) 0.9 % IJ SOLN
INTRAMUSCULAR | Status: DC | PRN
  Administered 2023-02-17: 80 mL

## 2023-02-17 MED ORDER — ORAL CARE MOUTH RINSE: 15.0000 mL | Freq: Once | OROMUCOSAL | Status: AC

## 2023-02-17 MED ORDER — KETOROLAC TROMETHAMINE 15 MG/ML IJ SOLN
7.5000 mg | Freq: Four times a day (QID) | INTRAMUSCULAR | Status: DC
  Administered 2023-02-17 – 2023-02-18 (×3): 7.5 mg via INTRAVENOUS
  Filled 2023-02-17 (×3): qty 1

## 2023-02-17 MED ORDER — SODIUM CHLORIDE (PF) 0.9 % IJ SOLN
INTRAMUSCULAR | Status: AC
  Filled 2023-02-17: qty 50

## 2023-02-17 MED ORDER — PROPOFOL 1000 MG/100ML IV EMUL
INTRAVENOUS | Status: AC
  Filled 2023-02-17: qty 100

## 2023-02-17 MED ORDER — DIPHENHYDRAMINE HCL 12.5 MG/5ML PO ELIX: 12.5000 mg | ORAL_SOLUTION | ORAL | Status: DC | PRN

## 2023-02-17 MED ORDER — PHENYLEPHRINE HCL-NACL 20-0.9 MG/250ML-% IV SOLN
INTRAVENOUS | Status: DC | PRN
  Administered 2023-02-17: 25 ug/min via INTRAVENOUS

## 2023-02-17 MED ORDER — METOCLOPRAMIDE HCL 5 MG PO TABS: 5.0000 mg | ORAL_TABLET | Freq: Three times a day (TID) | ORAL | Status: DC | PRN

## 2023-02-17 MED ORDER — ATORVASTATIN CALCIUM 40 MG PO TABS
40.0000 mg | ORAL_TABLET | Freq: Every day | ORAL | Status: DC
  Administered 2023-02-18: 40 mg via ORAL
  Filled 2023-02-17: qty 1

## 2023-02-17 MED ORDER — ONDANSETRON HCL 4 MG/2ML IJ SOLN
INTRAMUSCULAR | Status: AC
  Filled 2023-02-17: qty 2

## 2023-02-17 MED ORDER — MOMETASONE FURO-FORMOTEROL FUM 100-5 MCG/ACT IN AERO
2.0000 | INHALATION_SPRAY | Freq: Two times a day (BID) | RESPIRATORY_TRACT | Status: DC
  Administered 2023-02-17 – 2023-02-18 (×2): 2 via RESPIRATORY_TRACT
  Filled 2023-02-17: qty 8.8

## 2023-02-17 MED ORDER — ACETAMINOPHEN 325 MG PO TABS: 325.0000 mg | ORAL_TABLET | Freq: Four times a day (QID) | ORAL | Status: DC | PRN

## 2023-02-17 MED ORDER — POVIDONE-IODINE 10 % EX SWAB: 2.0000 | Freq: Once | CUTANEOUS | Status: DC

## 2023-02-17 MED ORDER — BUPIVACAINE HCL (PF) 0.5 % IJ SOLN
INTRAMUSCULAR | Status: AC
  Filled 2023-02-17: qty 30

## 2023-02-17 MED ORDER — MORPHINE SULFATE (PF) 2 MG/ML IV SOLN: 0.5000 mg | INTRAVENOUS | Status: DC | PRN

## 2023-02-17 MED ORDER — PROMETHAZINE HCL 25 MG/ML IJ SOLN: 6.2500 mg | INTRAMUSCULAR | Status: DC | PRN

## 2023-02-17 MED ORDER — LORATADINE 10 MG PO TABS
10.0000 mg | ORAL_TABLET | Freq: Every day | ORAL | Status: DC
  Administered 2023-02-18: 10 mg via ORAL
  Filled 2023-02-17: qty 1

## 2023-02-17 MED ORDER — ALBUTEROL SULFATE (2.5 MG/3ML) 0.083% IN NEBU: 2.5000 mg | INHALATION_SOLUTION | RESPIRATORY_TRACT | Status: DC | PRN

## 2023-02-17 MED ORDER — ACETAMINOPHEN 500 MG PO TABS
1000.0000 mg | ORAL_TABLET | Freq: Once | ORAL | Status: AC
  Administered 2023-02-17: 1000 mg via ORAL
  Filled 2023-02-17: qty 2

## 2023-02-17 MED ORDER — ASPIRIN 81 MG PO CHEW
81.0000 mg | CHEWABLE_TABLET | Freq: Two times a day (BID) | ORAL | Status: DC
  Administered 2023-02-18: 81 mg via ORAL
  Filled 2023-02-17: qty 1

## 2023-02-17 MED ORDER — DEXAMETHASONE SODIUM PHOSPHATE 4 MG/ML IJ SOLN
INTRAMUSCULAR | Status: DC | PRN
  Administered 2023-02-17: 5 mg via PERINEURAL

## 2023-02-17 MED ORDER — TRANEXAMIC ACID-NACL 1000-0.7 MG/100ML-% IV SOLN
1000.0000 mg | Freq: Once | INTRAVENOUS | Status: AC
  Administered 2023-02-17: 1000 mg via INTRAVENOUS
  Filled 2023-02-17: qty 100

## 2023-02-17 MED ORDER — BUPIVACAINE LIPOSOME 1.3 % IJ SUSP: 20.0000 mL | Freq: Once | INTRAMUSCULAR | Status: DC

## 2023-02-17 MED ORDER — BISACODYL 5 MG PO TBEC: 5.0000 mg | DELAYED_RELEASE_TABLET | Freq: Every day | ORAL | Status: DC | PRN

## 2023-02-17 MED ORDER — ACETAMINOPHEN 500 MG PO TABS
500.0000 mg | ORAL_TABLET | Freq: Four times a day (QID) | ORAL | Status: DC
  Administered 2023-02-17: 500 mg via ORAL
  Filled 2023-02-17: qty 1

## 2023-02-17 MED ORDER — METOCLOPRAMIDE HCL 5 MG/ML IJ SOLN: 5.0000 mg | Freq: Three times a day (TID) | INTRAMUSCULAR | Status: DC | PRN

## 2023-02-17 MED ORDER — ONDANSETRON HCL 4 MG/2ML IJ SOLN: 4.0000 mg | Freq: Four times a day (QID) | INTRAMUSCULAR | Status: DC | PRN

## 2023-02-17 MED ORDER — METHOCARBAMOL 500 MG IVPB - SIMPLE MED
500.0000 mg | Freq: Four times a day (QID) | INTRAVENOUS | Status: DC | PRN
  Administered 2023-02-17: 500 mg via INTRAVENOUS

## 2023-02-17 MED ORDER — DEXAMETHASONE SODIUM PHOSPHATE 10 MG/ML IJ SOLN
INTRAMUSCULAR | Status: DC | PRN
  Administered 2023-02-17: 8 mg via INTRAVENOUS

## 2023-02-17 MED ORDER — TRANEXAMIC ACID 1000 MG/10ML IV SOLN
INTRAVENOUS | Status: DC | PRN
  Administered 2023-02-17: 2000 mg via TOPICAL

## 2023-02-17 MED ORDER — BUPIVACAINE LIPOSOME 1.3 % IJ SUSP
INTRAMUSCULAR | Status: AC
  Filled 2023-02-17: qty 20

## 2023-02-17 MED ORDER — FOLIC ACID 1 MG PO TABS
0.5000 mg | ORAL_TABLET | Freq: Every day | ORAL | Status: DC
  Administered 2023-02-18: 0.5 mg via ORAL
  Filled 2023-02-17: qty 1

## 2023-02-17 MED ORDER — PROPOFOL 500 MG/50ML IV EMUL
INTRAVENOUS | Status: AC
  Filled 2023-02-17: qty 50

## 2023-02-17 MED ORDER — BUPIVACAINE IN DEXTROSE 0.75-8.25 % IT SOLN
INTRATHECAL | Status: DC | PRN
  Administered 2023-02-17: 1.6 mL via INTRATHECAL

## 2023-02-17 MED ORDER — FENTANYL CITRATE (PF) 100 MCG/2ML IJ SOLN
INTRAMUSCULAR | Status: AC
  Filled 2023-02-17: qty 2

## 2023-02-17 MED ORDER — ONDANSETRON HCL 4 MG/2ML IJ SOLN
INTRAMUSCULAR | Status: DC | PRN
  Administered 2023-02-17: 4 mg via INTRAVENOUS

## 2023-02-17 MED ORDER — ROPIVACAINE HCL 5 MG/ML IJ SOLN
INTRAMUSCULAR | Status: DC | PRN
  Administered 2023-02-17: 30 mL via PERINEURAL

## 2023-02-17 MED ORDER — CEFAZOLIN SODIUM-DEXTROSE 2-4 GM/100ML-% IV SOLN
2.0000 g | Freq: Four times a day (QID) | INTRAVENOUS | Status: AC
  Administered 2023-02-17 (×2): 2 g via INTRAVENOUS
  Filled 2023-02-17 (×2): qty 100

## 2023-02-17 MED ORDER — ALUM & MAG HYDROXIDE-SIMETH 200-200-20 MG/5ML PO SUSP: 30.0000 mL | ORAL | Status: DC | PRN

## 2023-02-17 MED ORDER — UMECLIDINIUM BROMIDE 62.5 MCG/ACT IN AEPB
1.0000 | INHALATION_SPRAY | Freq: Every day | RESPIRATORY_TRACT | Status: DC
  Administered 2023-02-18: 1 via RESPIRATORY_TRACT
  Filled 2023-02-17: qty 7

## 2023-02-17 MED ORDER — STERILE WATER FOR IRRIGATION IR SOLN
Status: DC | PRN
  Administered 2023-02-17: 2000 mL

## 2023-02-17 MED ORDER — MEPERIDINE HCL 50 MG/ML IJ SOLN: 6.2500 mg | INTRAMUSCULAR | Status: DC | PRN

## 2023-02-17 MED ORDER — DEXAMETHASONE SODIUM PHOSPHATE 10 MG/ML IJ SOLN
INTRAMUSCULAR | Status: AC
  Filled 2023-02-17: qty 1

## 2023-02-17 MED ORDER — CHLORHEXIDINE GLUCONATE 0.12 % MT SOLN
15.0000 mL | Freq: Once | OROMUCOSAL | Status: AC
  Administered 2023-02-17: 15 mL via OROMUCOSAL

## 2023-02-17 MED ORDER — METHOCARBAMOL 500 MG PO TABS
500.0000 mg | ORAL_TABLET | Freq: Four times a day (QID) | ORAL | Status: DC | PRN
  Administered 2023-02-17 – 2023-02-18 (×2): 500 mg via ORAL
  Filled 2023-02-17 (×2): qty 1

## 2023-02-17 MED ORDER — DOCUSATE SODIUM 100 MG PO CAPS
100.0000 mg | ORAL_CAPSULE | Freq: Two times a day (BID) | ORAL | Status: DC
  Administered 2023-02-17 – 2023-02-18 (×3): 100 mg via ORAL
  Filled 2023-02-17 (×3): qty 1

## 2023-02-17 MED ORDER — FENTANYL CITRATE (PF) 100 MCG/2ML IJ SOLN
INTRAMUSCULAR | Status: DC | PRN
  Administered 2023-02-17: 50 ug via INTRAVENOUS

## 2023-02-17 MED ORDER — MIDAZOLAM HCL 2 MG/2ML IJ SOLN
INTRAMUSCULAR | Status: AC
  Filled 2023-02-17: qty 2

## 2023-02-17 MED ORDER — METHOCARBAMOL 500 MG IVPB - SIMPLE MED
INTRAVENOUS | Status: AC
  Filled 2023-02-17: qty 55

## 2023-02-17 MED ORDER — HYDROCODONE-ACETAMINOPHEN 7.5-325 MG PO TABS
1.0000 | ORAL_TABLET | ORAL | Status: DC | PRN
  Administered 2023-02-17 – 2023-02-18 (×4): 1 via ORAL
  Filled 2023-02-17 (×4): qty 1

## 2023-02-17 MED ORDER — CEFAZOLIN SODIUM-DEXTROSE 2-3 GM-%(50ML) IV SOLR
INTRAVENOUS | Status: DC | PRN
  Administered 2023-02-17: 2 g via INTRAVENOUS

## 2023-02-17 SURGICAL SUPPLY — 60 items
ATTUNE MED DOME PAT 38 KNEE (Knees) IMPLANT
ATTUNE PS FEM LT SZ 5 CEM KNEE (Femur) IMPLANT
ATTUNE PSRP INSR SZ5 6 KNEE (Insert) IMPLANT
BAG COUNTER SPONGE SURGICOUNT (BAG) ×1 IMPLANT
BAG DECANTER FOR FLEXI CONT (MISCELLANEOUS) ×1 IMPLANT
BAG SPEC THK2 15X12 ZIP CLS (MISCELLANEOUS) ×1
BAG SPNG CNTER NS LX DISP (BAG) ×1
BAG ZIPLOCK 12X15 (MISCELLANEOUS) ×1 IMPLANT
BASE TIBIA ATTUNE KNEE SYS SZ6 (Knees) IMPLANT
BLADE SAGITTAL 25.0X1.19X90 (BLADE) ×1 IMPLANT
BLADE SAW SGTL 11.0X1.19X90.0M (BLADE) ×1 IMPLANT
BLADE SURG SZ10 CARB STEEL (BLADE) ×1 IMPLANT
BNDG CMPR 5X62 HK CLSR LF (GAUZE/BANDAGES/DRESSINGS) ×1
BNDG CMPR MED 10X6 ELC LF (GAUZE/BANDAGES/DRESSINGS) ×1
BNDG ELASTIC 6INX 5YD STR LF (GAUZE/BANDAGES/DRESSINGS) ×1 IMPLANT
BNDG ELASTIC 6X10 VLCR STRL LF (GAUZE/BANDAGES/DRESSINGS) IMPLANT
BOOTIES KNEE HIGH SLOAN (MISCELLANEOUS) ×1 IMPLANT
BOWL SMART MIX CTS (DISPOSABLE) ×1 IMPLANT
BSPLAT TIB 6 CMNT ROT PLAT STR (Knees) ×1 IMPLANT
CEMENT HV SMART SET (Cement) ×2 IMPLANT
COVER SURGICAL LIGHT HANDLE (MISCELLANEOUS) ×1 IMPLANT
CUFF TOURN SGL QUICK 34 (TOURNIQUET CUFF) ×1
CUFF TRNQT CYL 34X4.125X (TOURNIQUET CUFF) ×1 IMPLANT
DRAPE TOP 10253 STERILE (DRAPES) ×1 IMPLANT
DRAPE U-SHAPE 47X51 STRL (DRAPES) ×1 IMPLANT
DRSG AQUACEL AG ADV 3.5X10 (GAUZE/BANDAGES/DRESSINGS) ×1 IMPLANT
DURAPREP 26ML APPLICATOR (WOUND CARE) ×2 IMPLANT
ELECT REM PT RETURN 15FT ADLT (MISCELLANEOUS) ×1 IMPLANT
GLOVE BIO SURGEON STRL SZ8 (GLOVE) ×2 IMPLANT
GLOVE BIOGEL PI IND STRL 7.0 (GLOVE) ×1 IMPLANT
GLOVE BIOGEL PI IND STRL 8 (GLOVE) ×2 IMPLANT
GLOVE SURG SYN 7.0 (GLOVE) ×1 IMPLANT
GLOVE SURG SYN 7.0 PF PI (GLOVE) ×1 IMPLANT
GOWN SRG XL LVL 4 BRTHBL STRL (GOWNS) ×1 IMPLANT
GOWN STRL NON-REIN XL LVL4 (GOWNS) ×1
GOWN STRL REUS W/ TWL XL LVL3 (GOWN DISPOSABLE) ×2 IMPLANT
GOWN STRL REUS W/TWL XL LVL3 (GOWN DISPOSABLE) ×2
HANDPIECE INTERPULSE COAX TIP (DISPOSABLE) ×1
HOLDER FOLEY CATH W/STRAP (MISCELLANEOUS) IMPLANT
HOOD PEEL AWAY T7 (MISCELLANEOUS) ×3 IMPLANT
KIT TURNOVER KIT A (KITS) IMPLANT
MANIFOLD NEPTUNE II (INSTRUMENTS) ×1 IMPLANT
NS IRRIG 1000ML POUR BTL (IV SOLUTION) ×1 IMPLANT
PACK TOTAL KNEE CUSTOM (KITS) ×1 IMPLANT
PAD ARMBOARD 7.5X6 YLW CONV (MISCELLANEOUS) ×1 IMPLANT
PIN STEINMAN FIXATION KNEE (PIN) IMPLANT
PROTECTOR NERVE ULNAR (MISCELLANEOUS) ×1 IMPLANT
SET HNDPC FAN SPRY TIP SCT (DISPOSABLE) ×1 IMPLANT
SPIKE FLUID TRANSFER (MISCELLANEOUS) ×2 IMPLANT
SUT ETHIBOND NAB CT1 #1 30IN (SUTURE) ×1 IMPLANT
SUT VIC AB 0 CT1 36 (SUTURE) ×1 IMPLANT
SUT VIC AB 2-0 CT1 27 (SUTURE) ×1
SUT VIC AB 2-0 CT1 TAPERPNT 27 (SUTURE) ×1 IMPLANT
SUT VICRYL AB 3-0 FS1 BRD 27IN (SUTURE) ×1 IMPLANT
SUT VLOC 180 0 24IN GS25 (SUTURE) ×1 IMPLANT
TIBIA ATTUNE KNEE SYS BASE SZ6 (Knees) ×1 IMPLANT
TRAY FOLEY MTR SLVR 16FR STAT (SET/KITS/TRAYS/PACK) IMPLANT
WATER STERILE IRR 1000ML POUR (IV SOLUTION) ×1 IMPLANT
WRAP KNEE MAXI GEL POST OP (GAUZE/BANDAGES/DRESSINGS) ×1 IMPLANT
YANKAUER SUCT BULB TIP NO VENT (SUCTIONS) ×1 IMPLANT

## 2023-02-17 NOTE — Plan of Care (Signed)
  Problem: Pain Management: Goal: Pain level will decrease with appropriate interventions Outcome: Progressing   Problem: Coping: Goal: Level of anxiety will decrease Outcome: Progressing   

## 2023-02-17 NOTE — Op Note (Signed)
PREOP DIAGNOSIS: DJD LEFT KNEE POSTOP DIAGNOSIS:  same PROCEDURE: LEFT TKR ANESTHESIA: Spinal and MAC ATTENDING SURGEON: Velna Ochs ASSISTANT: Elodia Florence PA  INDICATIONS FOR PROCEDURE: Crystal [name] is a 72 y.o. female who has struggled for a long time with pain due to degenerative arthritis of the left knee.  The patient has failed many conservative non-operative measures and at this point has pain which limits the ability to sleep and walk.  The patient is offered total knee replacement.  Informed operative consent was obtained after discussion of possible risks of anesthesia, infection, neurovascular injury, DVT, and death.  The importance of the post-operative rehabilitation protocol to optimize result was stressed extensively with the patient.  SUMMARY OF FINDINGS AND PROCEDURE:  Crystal [name] was taken to the operative suite where under the above anesthesia a left knee replacement was performed.  There were advanced degenerative changes and the bone quality was good.  We used the DePuyAttune system and placed size 5 femur, 6 tibia, 38 mm all polyethylene patella, and a size 6 mm spacer.  Elodia Florence PA-C assisted throughout and was invaluable to the completion of the case in that he helped retract and maintain exposure while I placed the components.  He also helped close thereby minimizing OR time.  The patient was admitted for appropriate post-op care to include perioperative antibiotics and mechanical and pharmacologic measures for DVT prophylaxis.  DESCRIPTION OF PROCEDURE:  Crystal [name] was taken to the operative suite where the above anesthesia was applied.  The patient was positioned supine and prepped and draped in normal sterile fashion.  An appropriate time out was performed.  After the administration of kefzol pre-op antibiotic the leg was elevated and exsanguinated and a tourniquet inflated.  A standard longitudinal incision was made on the anterior knee.  Dissection  was carried down to the extensor mechanism.  All appropriate anti-infective measures were used including the pre-operative antibiotic, betadine impregnated drape, and closed hooded exhaust systems for each member of the surgical team.  A medial parapatellar incision was made in the extensor mechanism and the knee cap flipped and the knee flexed.  Some residual meniscal tissues were removed along with any remaining ACL/PCL tissue.  A guide was placed on the tibia and a flat cut was made on it's superior surface.  An intramedullary guide was placed in the femur and was utilized to make anterior and posterior cuts creating an appropriate flexion gap.  A second intramedullary guide was placed in the femur to make a distal cut properly balancing the knee with an extension gap equal to the flexion gap.  The three bones sized to the above mentioned sizes and the appropriate guides were placed and utilized.  A trial reduction was done and the knee easily came to full extension and the patella tracked well on flexion.  The trial components were removed and all bones were cleaned with pulsatile lavage and then dried thoroughly.  Cement was mixed and was pressurized onto the bones followed by placement of the aforementioned components.  Excess cement was trimmed and pressure was held on the components until the cement had hardened.  The tourniquet was deflated and a small amount of bleeding was controlled with cautery and pressure.  The knee was irrigated thoroughly.  The extensor mechanism was re-approximated with #1 ethibond in interrupted fashion.  The knee was flexed and the repair was solid.  The subcutaneous tissues were re-approximated with #0 and #2-0 vicryl and the skin  closed with a subcuticular stitch and steristrips.  A sterile dressing was applied.  Intraoperative fluids, EBL, and tourniquet time can be obtained from anesthesia records.  DISPOSITION:  The patient was taken to recovery room in stable condition  and scheduled to potentially go home same day depending on ability to walk and tolerate liquids.  Crystal [name] 02/17/2023, 9:08 AM

## 2023-02-17 NOTE — Transfer of Care (Addendum)
Immediate Anesthesia Transfer of Care Note  Patient: Crystal [name]  Procedure(s) Performed: LEFT TOTAL KNEE ARTHROPLASTY (Left: Knee)  Patient Location: PACU  Anesthesia Type:MAC and Spinal  Level of Consciousness: awake, alert , oriented, and patient cooperative  Airway & Oxygen Therapy: Patient Spontanous Breathing and Patient connected to face mask oxygen  Post-op Assessment: Report given to RN and Post -op Vital signs reviewed and stable  Post vital signs: Reviewed and stable  Last Vitals:  Vitals Value Taken Time  BP 71/41 02/17/23 0935  Temp    Pulse 65 02/17/23 0936  Resp 24 02/17/23 0936  SpO2 100 % 02/17/23 0936  Vitals shown include unvalidated device data.  Last Pain:  Vitals:   02/17/23 0617  TempSrc:   PainSc: 2       Patients Stated Pain Goal: 2 (02/17/23 0617)  Complications: No notable events documented.

## 2023-02-17 NOTE — Anesthesia Postprocedure Evaluation (Signed)
Anesthesia Post Note  Patient: Crystal [name]  Procedure(s) Performed: LEFT TOTAL KNEE ARTHROPLASTY (Left: Knee)     Patient location during evaluation: PACU Anesthesia Type: Spinal Level of consciousness: awake and alert Pain management: pain level controlled Vital Signs Assessment: post-procedure vital signs reviewed and stable Respiratory status: spontaneous breathing Cardiovascular status: stable Anesthetic complications: no   No notable events documented.  Last Vitals:  Vitals:   02/17/23 1030 02/17/23 1045  BP: 99/64 102/71  Pulse: (!) 59 61  Resp: 17 (!) 22  Temp:    SpO2: 100% 95%    Last Pain:  Vitals:   02/17/23 1104  TempSrc:   PainSc: 3                  Lewie Loron

## 2023-02-17 NOTE — Evaluation (Signed)
Physical Therapy Evaluation Patient Details Name: Crystal [name] MRN: [id number] DOB: July 25, 1951 Today's Date: 02/17/2023  History of Present Illness  72 yo female s/p L TKA 02/17/23. Hx of L THA DA 2021, ostoporosis/osteopenia  Clinical Impression  On eval POD 0, pt required Min A to safely mobilize. She walked ~10 feet before having to sit down 2* pain, fatigue. Assisted pt back to room in recliner. Encouraged her to sit up as tolerated. Will continue therapy on tomorrow. Pt is hopeful to d/c home on tomorrow if pain controlled and she meets her PT goals.        Recommendations for follow up therapy are one component of a multi-disciplinary discharge planning process, led by the attending physician.  Recommendations may be updated based on patient status, additional functional criteria and insurance authorization.  Follow Up Recommendations       Assistance Recommended at Discharge Intermittent Supervision/Assistance  Patient can return home with the following  A little help with walking and/or transfers;A little help with bathing/dressing/bathroom;Assistance with cooking/housework;Assist for transportation;Help with stairs or ramp for entrance    Equipment Recommendations None recommended by PT  Recommendations for Other Services       Functional Status Assessment Patient has had a recent decline in their functional status and demonstrates the ability to make significant improvements in function in a reasonable and predictable amount of time.     Precautions / Restrictions Precautions Precautions: Fall Restrictions Weight Bearing Restrictions: No LLE Weight Bearing: Weight bearing as tolerated      Mobility  Bed Mobility Overal bed mobility: Needs Assistance Bed Mobility: Supine to Sit     Supine to sit: Min guard, HOB elevated     General bed mobility comments: Min guard for safety, lines. Mild lightheadedness initially    Transfers Overall transfer level: Needs  assistance Equipment used: Rolling walker (2 wheels) Transfers: Sit to/from Stand Sit to Stand: Min guard           General transfer comment: Min guard A for safety. Cues for safety, technique, hand/LE placement.    Ambulation/Gait Ambulation/Gait assistance: Min assist Gait Distance (Feet): 10 Feet Assistive device: Rolling walker (2 wheels) Gait Pattern/deviations: Step-to pattern       General Gait Details: Assist to steady and follow with recliner. Cues for safety, sequencing. Pt reported L LE feeling weak. Used recliner to transport pt back to room.  Stairs            Wheelchair Mobility    Modified Rankin (Stroke Patients Only)       Balance Overall balance assessment: Needs assistance         Standing balance support: During functional activity, Reliant on assistive device for balance Standing balance-Leahy Scale: Poor                               Pertinent Vitals/Pain Pain Assessment Pain Assessment: 0-10 Pain Score: 6  Pain Location: L knee/thigh Pain Descriptors / Indicators: Discomfort, Aching Pain Intervention(s): Limited activity within patient's tolerance, Monitored during session, Ice applied, Repositioned (heat to low back)    Home Living Family/patient expects to be discharged to:: Private residence Living Arrangements: Alone Available Help at Discharge: Friend(s) (friend staying for 3 weeks) Type of Home: House Home Access: Stairs to enter Entrance Stairs-Rails:  (using doorframe on R side going up) Entrance Stairs-Number of Steps: 2+1   Home Layout: Able to live on main level with bedroom/bathroom  Home Equipment: BSC/3in1;Rolling Walker (2 wheels);Shower seat;Cane - single point      Prior Function Prior Level of Function : Independent/Modified Independent                     Hand Dominance        Extremity/Trunk Assessment   Upper Extremity Assessment Upper Extremity Assessment: Overall WFL for  tasks assessed    Lower Extremity Assessment Lower Extremity Assessment: Generalized weakness    Cervical / Trunk Assessment Cervical / Trunk Assessment: Normal  Communication   Communication: No difficulties  Cognition Arousal/Alertness: Awake/alert Behavior During Therapy: WFL for tasks assessed/performed Overall Cognitive Status: Within Functional Limits for tasks assessed                                          General Comments      Exercises Total Joint Exercises Ankle Circles/Pumps: AROM, 5 reps, Left Heel Slides: AAROM, Left, 10 reps Goniometric ROM: ~10-65 degrees   Assessment/Plan    PT Assessment Patient needs continued PT services  PT Problem List Decreased strength;Decreased activity tolerance;Decreased range of motion;Decreased balance;Decreased mobility;Decreased knowledge of use of DME;Pain       PT Treatment Interventions DME instruction;Therapeutic exercise;Gait training;Balance training;Stair training;Functional mobility training;Therapeutic activities;Patient/family education    PT Goals (Current goals can be found in the Care Plan section)  Acute Rehab PT Goals Patient Stated Goal: regain PLOF/independence. less pain PT Goal Formulation: With patient Time For Goal Achievement: 03/03/23 Potential to Achieve Goals: Good    Frequency 7X/week     Co-evaluation               AM-PAC PT "6 Clicks" Mobility  Outcome Measure Help needed turning from your back to your side while in a flat bed without using bedrails?: A Little Help needed moving from lying on your back to sitting on the side of a flat bed without using bedrails?: A Little Help needed moving to and from a bed to a chair (including a wheelchair)?: A Little Help needed standing up from a chair using your arms (e.g., wheelchair or bedside chair)?: A Little Help needed to walk in hospital room?: A Little Help needed climbing 3-5 steps with a railing? : A Little 6  Click Score: 18    End of Session Equipment Utilized During Treatment: Gait belt Activity Tolerance: Patient limited by fatigue Patient left: in chair;with call bell/phone within reach   PT Visit Diagnosis: Other abnormalities of gait and mobility (R26.89);Pain Pain - Right/Left: Left Pain - part of body: Knee    Time: 1610-9604 PT Time Calculation (min) (ACUTE ONLY): 26 min   Charges:   PT Evaluation $PT Eval Low Complexity: 1 Low PT Treatments $Gait Training: 8-22 mins         Faye Ramsay, PT Acute Rehabilitation  Office: [phone]

## 2023-02-17 NOTE — Anesthesia Procedure Notes (Signed)
Anesthesia Regional Block: Adductor canal block   Pre-Anesthetic Checklist: , timeout performed,  Correct Patient, Correct Site, Correct Laterality,  Correct Procedure, Correct Position, site marked,  Risks and benefits discussed,  Surgical consent,  Pre-op evaluation,  At surgeon's request and post-op pain management  Laterality: Lower and Left  Prep: chloraprep       Needles:  Injection technique: Single-shot  Needle Type: Stimiplex     Needle Length: 9cm  Needle Gauge: 21     Additional Needles:   Procedures:,,,, ultrasound used (permanent image in chart),,    Narrative:  Start time: 02/17/2023 6:55 AM End time: 02/17/2023 7:15 AM Injection made incrementally with aspirations every 5 mL.  Performed by: Personally  Anesthesiologist: Lewie Loron, MD  Additional Notes: BP cuff, EKG monitors applied. Sedation begun. Artery and nerve location verified with ultrasound. Anesthetic injected incrementally (5ml), slowly, and after negative aspirations under direct u/s guidance. Good fascial/perineural spread. Tolerated well.

## 2023-02-17 NOTE — Plan of Care (Signed)

## 2023-02-17 NOTE — Anesthesia Procedure Notes (Signed)
Spinal  Patient location during procedure: OR Start time: 02/17/2023 7:37 AM End time: 02/17/2023 7:44 AM Reason for block: surgical anesthesia Staffing Performed: anesthesiologist  Anesthesiologist: Lewie Loron, MD Resident/CRNA: Elisabeth Cara, CRNA Performed by: Lewie Loron, MD Authorized by: Lewie Loron, MD   Preanesthetic Checklist Completed: patient identified, IV checked, site marked, risks and benefits discussed, surgical consent, monitors and equipment checked, pre-op evaluation and timeout performed Spinal Block Patient position: sitting Prep: DuraPrep and site prepped and draped Patient monitoring: heart rate, continuous pulse ox and blood pressure Approach: midline Location: L2-3 Injection technique: single-shot Needle Needle type: Spinocan  Needle gauge: 25 G Needle length: 9 cm Assessment Events: second provider Additional Notes Expiration date of kit checked and confirmed. Patient tolerated procedure well, without complications.

## 2023-02-17 NOTE — Interval H&P Note (Signed)
History and Physical Interval Note:  02/17/2023 7:27 AM  Crystal [name]  has presented today for surgery, with the diagnosis of LEFT KNEE DEGENERATIVE JOINT DISEASE.  The various methods of treatment have been discussed with the patient and family. After consideration of risks, benefits and other options for treatment, the patient has consented to  Procedure(s): LEFT TOTAL KNEE ARTHROPLASTY (Left) as a surgical intervention.  The patient's history has been reviewed, patient examined, no change in status, stable for surgery.  I have reviewed the patient's chart and labs.  Questions were answered to the patient's satisfaction.     Velna Ochs

## 2023-02-18 ENCOUNTER — Encounter (HOSPITAL_COMMUNITY): Payer: Self-pay | Admitting: Orthopaedic Surgery

## 2023-02-18 DIAGNOSIS — I10 Essential (primary) hypertension: Secondary | ICD-10-CM | POA: Diagnosis not present

## 2023-02-18 DIAGNOSIS — J45909 Unspecified asthma, uncomplicated: Secondary | ICD-10-CM | POA: Diagnosis not present

## 2023-02-18 DIAGNOSIS — Z96642 Presence of left artificial hip joint: Secondary | ICD-10-CM | POA: Diagnosis not present

## 2023-02-18 DIAGNOSIS — J449 Chronic obstructive pulmonary disease, unspecified: Secondary | ICD-10-CM | POA: Diagnosis not present

## 2023-02-18 DIAGNOSIS — Z7982 Long term (current) use of aspirin: Secondary | ICD-10-CM | POA: Diagnosis not present

## 2023-02-18 DIAGNOSIS — M1712 Unilateral primary osteoarthritis, left knee: Secondary | ICD-10-CM | POA: Diagnosis not present

## 2023-02-18 DIAGNOSIS — I2510 Atherosclerotic heart disease of native coronary artery without angina pectoris: Secondary | ICD-10-CM | POA: Diagnosis not present

## 2023-02-18 DIAGNOSIS — Z79899 Other long term (current) drug therapy: Secondary | ICD-10-CM | POA: Diagnosis not present

## 2023-02-18 DIAGNOSIS — Z87891 Personal history of nicotine dependence: Secondary | ICD-10-CM | POA: Diagnosis not present

## 2023-02-18 MED ORDER — TIZANIDINE HCL 4 MG PO TABS: 4.0000 mg | ORAL_TABLET | Freq: Four times a day (QID) | ORAL | 1 refills | Status: AC | PRN

## 2023-02-18 MED ORDER — HYDROCODONE-ACETAMINOPHEN 5-325 MG PO TABS: 1.0000 | ORAL_TABLET | Freq: Four times a day (QID) | ORAL | 0 refills | Status: DC | PRN

## 2023-02-18 MED ORDER — ASPIRIN 81 MG PO TBEC: 81.0000 mg | DELAYED_RELEASE_TABLET | Freq: Two times a day (BID) | ORAL | 0 refills | Status: AC

## 2023-02-18 NOTE — Discharge Summary (Signed)
Patient ID: Crystal [name] MRN: [id number] DOB/AGE: 05/31/1951 72 y.o.  Admit date: 02/17/2023 Discharge date: 02/18/2023  Admission Diagnoses:  Principal Problem:   Primary osteoarthritis of left knee Active Problems:   Primary localized osteoarthritis of left knee   Discharge Diagnoses:  Same  Past Medical History:  Diagnosis Date   ALLERGIC RHINITIS    Allergy    ANXIETY    no medations needed   Arthritis    Asthma    Bronchitis    Cataract    no surgery   COLONIC POLYPS, HX OF    Complication of anesthesia    oxygen sat dropped during endoscopy    COPD    pt unsure of this dx   Cough    DEPRESSION    Dyspnea    Elevated coronary artery calcium score 12/2021   Agatston score 173-all LAD   Fatty liver    GERD    GLUCOSE INTOLERANCE    HYPERTENSION    IBS    Impaired glucose tolerance    Obstructive sleep apnea 06/25/2018   OSTEOPENIA    Osteopenia    Osteoporosis 07/30/2017   PERIPHERAL EDEMA    Pneumonia    Pneumonia, organism unspecified(486)    Sleep apnea    Oral Aparatus   TOBACCO USE DISORDER/SMOKER-SMOKING CESSATION DISCUSSED     Surgeries: Procedure(s): LEFT TOTAL KNEE ARTHROPLASTY on 02/17/2023   Consultants:   Discharged Condition: Improved  Hospital Course: Crystal [name] is an 72 y.o. female who was admitted 02/17/2023 for operative treatment ofPrimary osteoarthritis of left knee. Patient has severe unremitting pain that affects sleep, daily activities, and work/hobbies. After pre-op clearance the patient was taken to the operating room on 02/17/2023 and underwent  Procedure(s): LEFT TOTAL KNEE ARTHROPLASTY.    Patient was given perioperative antibiotics:  Anti-infectives (From admission, onward)    Start     Dose/Rate Route Frequency Ordered Stop   02/17/23 1400  ceFAZolin (ANCEF) IVPB 2g/100 mL premix        2 g 200 mL/hr over 30 Minutes Intravenous Every 6 hours 02/17/23 1123 02/17/23 2043   02/17/23 0600  vancomycin (VANCOCIN)  IVPB 1000 mg/200 mL premix  Status:  Discontinued        1,000 mg 200 mL/hr over 60 Minutes Intravenous On call to O.R. 02/17/23 0542 02/17/23 1121        Patient was given sequential compression devices, early ambulation, and chemoprophylaxis to prevent DVT.  Patient benefited maximally from hospital stay and there were no complications.    Recent vital signs: Patient Vitals for the past 24 hrs:  BP Temp Temp src Pulse Resp SpO2  02/18/23 0605 112/62 98.3 F (36.8 C) Oral 64 17 98 %  02/18/23 0139 (!) 136/59 98.2 F (36.8 C) Oral 72 17 96 %  02/17/23 2204 117/66 98.6 F (37 C) Oral 77 17 95 %  02/17/23 2144 -- -- -- 73 16 96 %  02/17/23 1900 -- -- -- -- -- 96 %  02/17/23 1747 119/72 98.5 F (36.9 C) Oral 67 17 96 %  02/17/23 1323 125/72 98.4 F (36.9 C) Oral 64 17 97 %  02/17/23 1129 117/71 97.6 F (36.4 C) Oral 62 15 99 %  02/17/23 1100 (!) 104/59 -- -- 70 15 93 %  02/17/23 1045 102/71 -- -- 61 (!) 22 95 %  02/17/23 1030 99/64 -- -- (!) 59 17 100 %  02/17/23 1015 (!) 99/58 -- -- 66 18 96 %  02/17/23  1000 (!) 94/49 -- -- 66 20 96 %  02/17/23 0945 (!) 90/51 -- -- 63 18 98 %  02/17/23 0937 (!) 87/54 -- -- 65 (!) 24 100 %  02/17/23 0935 (!) 71/41 97.7 F (36.5 C) -- 63 18 100 %     Recent laboratory studies: No results for input(s): "WBC", "HGB", "HCT", "PLT", "NA", "K", "CL", "CO2", "BUN", "CREATININE", "GLUCOSE", "INR", "CALCIUM" in the last 72 hours.  Invalid input(s): "PT", "2"   Discharge Medications:   Allergies as of 02/18/2023       Reactions   Tape    Removed a layer of skin when removed, please use paper tape   Ace Inhibitors Cough   Penicillins Rash        Medication List     TAKE these medications    albuterol 108 (90 Base) MCG/ACT inhaler Commonly known as: Ventolin HFA Inhale 2 puffs into the lungs every 4 (four) hours as needed for wheezing or shortness of breath (coughing fits).   aspirin EC 81 MG tablet Take 1 tablet (81 mg total) by mouth  2 (two) times daily. For 2 weeks then once a day for 2 weeks after that. What changed:  when to take this additional instructions   atorvastatin 40 MG tablet Commonly known as: LIPITOR TAKE 1 TABLET BY MOUTH EVERY DAY   Breztri Aerosphere 160-9-4.8 MCG/ACT Aero Generic drug: Budeson-Glycopyrrol-Formoterol Inhale 2 puffs into the lungs in the morning and at bedtime. with spacer and rinse mouth afterwards.   Calcium Carb-Cholecalciferol 600-800 MG-UNIT Tabs Take 1 tablet by mouth in the morning and at bedtime.   CRANBERRY PO Take 1 tablet by mouth 3 (three) times a week.   denosumab 60 MG/ML Sosy injection Commonly known as: PROLIA Inject 60 mg into the skin every 6 (six) months.   folic acid 400 MCG tablet Commonly known as: FOLVITE Take 400 mcg by mouth daily.   HYDROcodone-acetaminophen 5-325 MG tablet Commonly known as: NORCO/VICODIN Take 1-2 tablets by mouth every 6 (six) hours as needed for moderate pain or severe pain (post op pain).   loratadine 10 MG tablet Commonly known as: CLARITIN Take 10 mg by mouth daily.   losartan 50 MG tablet Commonly known as: COZAAR TAKE 1 TABLET EVERY DAY (ANNUAL APPOINTMENT DUE IN MAY, MUST SEE MD FOR FUTURE REFILLS)   metoprolol succinate 25 MG 24 hr tablet Commonly known as: TOPROL-XL TAKE 1 TABLET BY MOUTH EVERY DAY   NON FORMULARY Pt uses a cpap nightly   tiZANidine 4 MG tablet Commonly known as: Zanaflex Take 1 tablet (4 mg total) by mouth every 6 (six) hours as needed for muscle spasms.   tolterodine 4 MG 24 hr capsule Commonly known as: DETROL LA Take 4 mg by mouth daily.   VITAMIN D PO Take 2,000 Units by mouth daily.               Durable Medical Equipment  (From admission, onward)           Start     Ordered   02/17/23 1124  DME Walker rolling  Once       Question:  Patient needs a walker to treat with the following condition  Answer:  Primary osteoarthritis of left knee   02/17/23 1123    02/17/23 1124  DME 3 n 1  Once        02/17/23 1123   02/17/23 1124  DME Bedside commode  Once  Question:  Patient needs a bedside commode to treat with the following condition  Answer:  Primary osteoarthritis of left knee   02/17/23 1123            Diagnostic Studies: DG Chest 2 View  Result Date: 02/08/2023 CLINICAL DATA:  [id number] Pre-op exam [id number] EXAM: CHEST - 2 VIEW COMPARISON:  06/03/2021. FINDINGS: The heart size and mediastinal contours are within normal limits. Both lungs are clear. No pneumothorax or pleural effusion. Aorta is calcified. There are thoracic degenerative changes. IMPRESSION: No active cardiopulmonary disease. Electronically Signed   By: Layla Maw M.D.   On: 02/08/2023 20:40    Disposition: Discharge disposition: 01-Home or Self Care       Discharge Instructions     Call MD / Call 911   Complete by: As directed    If you experience chest pain or shortness of breath, CALL 911 and be transported to the hospital emergency room.  If you develope a fever above 101 F, pus (white drainage) or increased drainage or redness at the wound, or calf pain, call your surgeon's office.   Constipation Prevention   Complete by: As directed    Drink plenty of fluids.  Prune juice may be helpful.  You may use a stool softener, such as Colace (over the counter) 100 mg twice a day.  Use MiraLax (over the counter) for constipation as needed.   Diet - low sodium heart healthy   Complete by: As directed    Discharge instructions   Complete by: As directed    INSTRUCTIONS AFTER JOINT REPLACEMENT   Remove items at home which could result in a fall. This includes throw rugs or furniture in walking pathways ICE to the affected joint every three hours while awake for 30 minutes at a time, for at least the first 3-5 days, and then as needed for pain and swelling.  Continue to use ice for pain and swelling. You may notice swelling that will progress down to the foot and  ankle.  This is normal after surgery.  Elevate your leg when you are not up walking on it.   Continue to use the breathing machine you got in the hospital (incentive spirometer) which will help keep your temperature down.  It is common for your temperature to cycle up and down following surgery, especially at night when you are not up moving around and exerting yourself.  The breathing machine keeps your lungs expanded and your temperature down.   DIET:  As you were doing prior to hospitalization, we recommend a well-balanced diet.  DRESSING / WOUND CARE / SHOWERING  You may shower 3 days after surgery, but keep the wounds dry during showering.  You may use an occlusive plastic wrap (Press'n Seal for example), NO SOAKING/SUBMERGING IN THE BATHTUB.  If the bandage gets wet, change with a clean dry gauze.  If the incision gets wet, pat the wound dry with a clean towel.  ACTIVITY  Increase activity slowly as tolerated, but follow the weight bearing instructions below.   No driving for 6 weeks or until further direction given by your physician.  You cannot drive while taking narcotics.  No lifting or carrying greater than 10 lbs. until further directed by your surgeon. Avoid periods of inactivity such as sitting longer than an hour when not asleep. This helps prevent blood clots.  You may return to work once you are authorized by your doctor.     WEIGHT BEARING  Weight bearing as tolerated with assist device (walker, cane, etc) as directed, use it as long as suggested by your surgeon or therapist, typically at least 4-6 weeks.   EXERCISES  Results after joint replacement surgery are often greatly improved when you follow the exercise, range of motion and muscle strengthening exercises prescribed by your doctor. Safety measures are also important to protect the joint from further injury. Any time any of these exercises cause you to have increased pain or swelling, decrease what you are doing  until you are comfortable again and then slowly increase them. If you have problems or questions, call your caregiver or physical therapist for advice.   Rehabilitation is important following a joint replacement. After just a few days of immobilization, the muscles of the leg can become weakened and shrink (atrophy).  These exercises are designed to build up the tone and strength of the thigh and leg muscles and to improve motion. Often times heat used for twenty to thirty minutes before working out will loosen up your tissues and help with improving the range of motion but do not use heat for the first two weeks following surgery (sometimes heat can increase post-operative swelling).   These exercises can be done on a training (exercise) mat, on the floor, on a table or on a bed. Use whatever works the best and is most comfortable for you.    Use music or television while you are exercising so that the exercises are a pleasant break in your day. This will make your life better with the exercises acting as a break in your routine that you can look forward to.   Perform all exercises about fifteen times, three times per day or as directed.  You should exercise both the operative leg and the other leg as well.  Exercises include:   Quad Sets - Tighten up the muscle on the front of the thigh (Quad) and hold for 5-10 seconds.   Straight Leg Raises - With your knee straight (if you were given a brace, keep it on), lift the leg to 60 degrees, hold for 3 seconds, and slowly lower the leg.  Perform this exercise against resistance later as your leg gets stronger.  Leg Slides: Lying on your back, slowly slide your foot toward your buttocks, bending your knee up off the floor (only go as far as is comfortable). Then slowly slide your foot back down until your leg is flat on the floor again.  Angel Wings: Lying on your back spread your legs to the side as far apart as you can without causing discomfort.  Hamstring  Strength:  Lying on your back, push your heel against the floor with your leg straight by tightening up the muscles of your buttocks.  Repeat, but this time bend your knee to a comfortable angle, and push your heel against the floor.  You may put a pillow under the heel to make it more comfortable if necessary.   A rehabilitation program following joint replacement surgery can speed recovery and prevent re-injury in the future due to weakened muscles. Contact your doctor or a physical therapist for more information on knee rehabilitation.    CONSTIPATION  Constipation is defined medically as fewer than three stools per week and severe constipation as less than one stool per week.  Even if you have a regular bowel pattern at home, your normal regimen is likely to be disrupted due to multiple reasons following surgery.  Combination of anesthesia, postoperative  narcotics, change in appetite and fluid intake all can affect your bowels.   YOU MUST use at least one of the following options; they are listed in order of increasing strength to get the job done.  They are all available over the counter, and you may need to use some, POSSIBLY even all of these options:    Drink plenty of fluids (prune juice may be helpful) and high fiber foods Colace 100 mg by mouth twice a day  Senokot for constipation as directed and as needed Dulcolax (bisacodyl), take with full glass of water  Miralax (polyethylene glycol) once or twice a day as needed.  If you have tried all these things and are unable to have a bowel movement in the first 3-4 days after surgery call either your surgeon or your primary doctor.    If you experience loose stools or diarrhea, hold the medications until you stool forms back up.  If your symptoms do not get better within 1 week or if they get worse, check with your doctor.  If you experience "the worst abdominal pain ever" or develop nausea or vomiting, please contact the office immediately  for further recommendations for treatment.   ITCHING:  If you experience itching with your medications, try taking only a single pain pill, or even half a pain pill at a time.  You can also use Benadryl over the counter for itching or also to help with sleep.   TED HOSE STOCKINGS:  Use stockings on both legs until for at least 2 weeks or as directed by physician office. They may be removed at night for sleeping.  MEDICATIONS:  See your medication summary on the "After Visit Summary" that nursing will review with you.  You may have some home medications which will be placed on hold until you complete the course of blood thinner medication.  It is important for you to complete the blood thinner medication as prescribed.  PRECAUTIONS:  If you experience chest pain or shortness of breath - call 911 immediately for transfer to the hospital emergency department.   If you develop a fever greater that 101 F, purulent drainage from wound, increased redness or drainage from wound, foul odor from the wound/dressing, or calf pain - CONTACT YOUR SURGEON.                                                   FOLLOW-UP APPOINTMENTS:  If you do not already have a post-op appointment, please call the office for an appointment to be seen by your surgeon.  Guidelines for how soon to be seen are listed in your "After Visit Summary", but are typically between 1-4 weeks after surgery.  OTHER INSTRUCTIONS:   Knee Replacement:  Do not place pillow under knee, focus on keeping the knee straight while resting. CPM instructions: 0-90 degrees, 2 hours in the morning, 2 hours in the afternoon, and 2 hours in the evening. Place foam block, curve side up under heel at all times except when in CPM or when walking.  DO NOT modify, tear, cut, or change the foam block in any way.  POST-OPERATIVE OPIOID TAPER INSTRUCTIONS: It is important to wean off of your opioid medication as soon as possible. If you do not need pain medication  after your surgery it is ok to stop day one. Opioids  include: Codeine, Hydrocodone(Norco, Vicodin), Oxycodone(Percocet, oxycontin) and hydromorphone amongst others.  Long term and even short term use of opiods can cause: Increased pain response Dependence Constipation Depression Respiratory depression And more.  Withdrawal symptoms can include Flu like symptoms Nausea, vomiting And more Techniques to manage these symptoms Hydrate well Eat regular healthy meals Stay active Use relaxation techniques(deep breathing, meditating, yoga) Do Not substitute Alcohol to help with tapering If you have been on opioids for less than two weeks and do not have pain than it is ok to stop all together.  Plan to wean off of opioids This plan should start within one week post op of your joint replacement. Maintain the same interval or time between taking each dose and first decrease the dose.  Cut the total daily intake of opioids by one tablet each day Next start to increase the time between doses. The last dose that should be eliminated is the evening dose.     MAKE SURE YOU:  Understand these instructions.  Get help right away if you are not doing well or get worse.    Thank you for letting us be a part of your medical care team.  It is a privilege we respect greatly.  We hope these instructions will help you stay on track for a fast and full recovery!   Increase activity slowly as tolerated   Complete by: As directed    Post-operative opioid taper instructions:   Complete by: As directed    POST-OPERATIVE OPIOID TAPER INSTRUCTIONS: It is important to wean off of your opioid medication as soon as possible. If you do not need pain medication after your surgery it is ok to stop day one. Opioids include: Codeine, Hydrocodone(Norco, Vicodin), Oxycodone(Percocet, oxycontin) and hydromorphone amongst others.  Long term and even short term use of opiods can cause: Increased pain  response Dependence Constipation Depression Respiratory depression And more.  Withdrawal symptoms can include Flu like symptoms Nausea, vomiting And more Techniques to manage these symptoms Hydrate well Eat regular healthy meals Stay active Use relaxation techniques(deep breathing, meditating, yoga) Do Not substitute Alcohol to help with tapering If you have been on opioids for less than two weeks and do not have pain than it is ok to stop all together.  Plan to wean off of opioids This plan should start within one week post op of your joint replacement. Maintain the same interval or time between taking each dose and first decrease the dose.  Cut the total daily intake of opioids by one tablet each day Next start to increase the time between doses. The last dose that should be eliminated is the evening dose.           Follow-up Information     Marcene Corning, MD. Go on 02/27/2023.   Specialty: Orthopedic Surgery Why: Your appointment is scheduled for 10:00. Contact information: [address] [place] [address] [phone]         [facility] Go on 02/19/2023.   Why: Your outpatient physical therapy is scheduled for 1:20. Please arrive at 1:00 to complete your paperwork Contact information: Physical Therapy [address] [place] [address] [phone]                  Signed: Ginger Organ Zaryah Seckel 02/18/2023, 8:21 AM

## 2023-02-18 NOTE — Progress Notes (Signed)
Physical Therapy Treatment Patient Details Name: Crystal [name] MRN: [id number] DOB: 04-11-1951 Today's Date: 02/18/2023   History of Present Illness 72 yo female s/p L TKA 02/17/23. Hx of L THA DA 2021, ostoporosis/osteopenia    PT Comments    Pt agreeable to therapy. Reviewed/practiced exercises, gait training and stair training. Pain controlled per pt report. Encouraged pt to ambulate often at home, as tolerated. All PT education completed.    Recommendations for follow up therapy are one component of a multi-disciplinary discharge planning process, led by the attending physician.  Recommendations may be updated based on patient status, additional functional criteria and insurance authorization.  Follow Up Recommendations       Assistance Recommended at Discharge Intermittent Supervision/Assistance  Patient can return home with the following A little help with walking and/or transfers;A little help with bathing/dressing/bathroom;Assistance with cooking/housework;Assist for transportation;Help with stairs or ramp for entrance   Equipment Recommendations  None recommended by PT    Recommendations for Other Services       Precautions / Restrictions Precautions Precautions: Fall Restrictions Weight Bearing Restrictions: No LLE Weight Bearing: Weight bearing as tolerated     Mobility  Bed Mobility Overal bed mobility: Needs Assistance Bed Mobility: Supine to Sit     Supine to sit: Supervision, HOB elevated     General bed mobility comments: Supv for safety, lines. Mild lightheadedness that resolved befor standing    Transfers Overall transfer level: Needs assistance Equipment used: Rolling walker (2 wheels) Transfers: Sit to/from Stand Sit to Stand: Supervision           General transfer comment: Supv for safety.    Ambulation/Gait Ambulation/Gait assistance: Supervision Gait Distance (Feet): 75 Feet Assistive device: Rolling walker (2 wheels) Gait  Pattern/deviations: Decreased stride length       General Gait Details: Supv for safety. Pt denied dizziness.   Stairs Stairs: Yes   Stair Management: Step to pattern, One rail Right, Forwards Number of Stairs: 2 General stair comments: Cues for safety, technique, sequence. Pt used R handrail to simulate entry into home (she will have a doorframe she uses to hold on to usually). Family present to observe.   Wheelchair Mobility    Modified Rankin (Stroke Patients Only)       Balance Overall balance assessment: Needs assistance         Standing balance support: Reliant on assistive device for balance, During functional activity Standing balance-Leahy Scale: Fair                              Cognition Arousal/Alertness: Awake/alert Behavior During Therapy: WFL for tasks assessed/performed Overall Cognitive Status: Within Functional Limits for tasks assessed                                          Exercises Total Joint Exercises Ankle Circles/Pumps: AROM, Both, 10 reps Quad Sets: AROM, Left, 10 reps Hip ABduction/ADduction: AROM, Left, 10 reps Straight Leg Raises: AROM, Left, 10 reps Knee Flexion: AROM, Left, 10 reps, Seated Goniometric ROM: ~10-95 degrees    General Comments        Pertinent Vitals/Pain Pain Assessment Pain Assessment: 0-10 Pain Score: 5  Pain Location: L posterior knee/anterior thigh Pain Descriptors / Indicators: Discomfort, Aching, Tightness Pain Intervention(s): Monitored during session, Ice applied, Repositioned    Home Living  Prior Function            PT Goals (current goals can now be found in the care plan section) Progress towards PT goals: Progressing toward goals    Frequency    7X/week      PT Plan Current plan remains appropriate    Co-evaluation              AM-PAC PT "6 Clicks" Mobility   Outcome Measure  Help needed turning from your  back to your side while in a flat bed without using bedrails?: None Help needed moving from lying on your back to sitting on the side of a flat bed without using bedrails?: None Help needed moving to and from a bed to a chair (including a wheelchair)?: A Little Help needed standing up from a chair using your arms (e.g., wheelchair or bedside chair)?: A Little Help needed to walk in hospital room?: A Little Help needed climbing 3-5 steps with a railing? : A Little 6 Click Score: 20    End of Session Equipment Utilized During Treatment: Gait belt Activity Tolerance: Patient tolerated treatment well Patient left: in chair;with call bell/phone within reach;with family/visitor present   PT Visit Diagnosis: Other abnormalities of gait and mobility (R26.89) Pain - Right/Left: Left Pain - part of body: Knee     Time: 1610-9604 PT Time Calculation (min) (ACUTE ONLY): 29 min  Charges:  $Gait Training: 8-22 mins $Therapeutic Exercise: 8-22 mins                        Faye Ramsay, PT Acute Rehabilitation  Office: [phone]

## 2023-02-18 NOTE — Progress Notes (Signed)
Subjective: 1 Day Post-Op Procedure(s) (LRB): LEFT TOTAL KNEE ARTHROPLASTY (Left)  Patient is doing well. She is hoping to go home today.   Activity level:  wbat Diet tolerance:  ok Voiding:  ok Patient reports pain as mild.    Objective: Vital signs in last 24 hours: Temp:  [97.6 F (36.4 C)-98.6 F (37 C)] 98.3 F (36.8 C) (05/01 0605) Pulse Rate:  [59-77] 64 (05/01 0605) Resp:  [15-24] 17 (05/01 0605) BP: (71-136)/(41-72) 112/62 (05/01 0605) SpO2:  [93 %-100 %] 98 % (05/01 0605) FiO2 (%):  [21 %] 21 % (04/30 2144)  Labs: No results for input(s): "HGB" in the last 72 hours. No results for input(s): "WBC", "RBC", "HCT", "PLT" in the last 72 hours. No results for input(s): "NA", "K", "CL", "CO2", "BUN", "CREATININE", "GLUCOSE", "CALCIUM" in the last 72 hours. No results for input(s): "LABPT", "INR" in the last 72 hours.  Physical Exam:  Neurologically intact ABD soft Neurovascular intact Sensation intact distally Intact pulses distally Dorsiflexion/Plantar flexion intact Incision: no drainage No cellulitis present Compartment soft  Assessment/Plan:  1 Day Post-Op Procedure(s) (LRB): LEFT TOTAL KNEE ARTHROPLASTY (Left) Advance diet Up with therapy D/C IV fluids Discharge home with home health if doing well and cleared by PT. 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  Ginger Organ Kandy Towery 02/18/2023, 8:13 AM

## 2023-02-18 NOTE — TOC Transition Note (Signed)
Transition of Care [facility]) - CM/SW Discharge Note  Patient Details  Name: Crystal [name] MRN: [id number] Date of Birth: 1950/10/22  [facility]) CM/SW Contact:  Ewing Schlein, LCSW Phone Number: 02/18/2023, 9:36 AM  Clinical Narrative: Patient is expected to discharge home after working with PT. CSW met with patient and son to confirm discharge plan. Patient will go home with OPPT at [facility]. Patient has a rolling walker, shower chair, and BSC at home so there are no DME needs. TOC signing off.  Final next level of care: OP Rehab Barriers to Discharge: No Barriers Identified  Patient Goals and CMS Choice Choice offered to / list presented to : NA  Discharge Plan and Services Additional resources added to the After Visit Summary for         DME Arranged: N/A DME Agency: NA  Social Determinants of Health (SDOH) Interventions SDOH Screenings   Food Insecurity: No Food Insecurity (02/17/2023)  Housing: Low Risk  (02/17/2023)  Transportation Needs: No Transportation Needs (02/17/2023)  Utilities: Not At Risk (02/17/2023)  Alcohol Screen: Low Risk  (04/07/2022)  Depression (PHQ2-9): Low Risk  (11/20/2022)  Financial Resource Strain: Low Risk  (04/07/2022)  Physical Activity: Sufficiently Active (04/07/2022)  Social Connections: Socially Isolated (04/07/2022)  Stress: No Stress Concern Present (04/07/2022)  Tobacco Use: Medium Risk (02/17/2023)   Readmission Risk Interventions     No data to display

## 2023-02-18 NOTE — Plan of Care (Signed)
  Problem: Education: Goal: Knowledge of the prescribed therapeutic regimen will improve Outcome: Completed/Met Goal: Individualized Educational Video(s) Outcome: Completed/Met   Problem: Activity: Goal: Ability to avoid complications of mobility impairment will improve Outcome: Completed/Met Goal: Range of joint motion will improve Outcome: Completed/Met   Problem: Clinical Measurements: Goal: Postoperative complications will be avoided or minimized Outcome: Completed/Met   Problem: Pain Management: Goal: Pain level will decrease with appropriate interventions Outcome: Completed/Met   Problem: Skin Integrity: Goal: Will show signs of wound healing Outcome: Completed/Met   Problem: Education: Goal: Knowledge of General Education information will improve Description: Including pain rating scale, medication(s)/side effects and non-pharmacologic comfort measures Outcome: Completed/Met   Problem: Health Behavior/Discharge Planning: Goal: Ability to manage health-related needs will improve Outcome: Completed/Met   Problem: Clinical Measurements: Goal: Ability to maintain clinical measurements within normal limits will improve Outcome: Completed/Met Goal: Will remain free from infection Outcome: Completed/Met Goal: Diagnostic test results will improve Outcome: Completed/Met Goal: Respiratory complications will improve Outcome: Completed/Met Goal: Cardiovascular complication will be avoided Outcome: Completed/Met   Problem: Activity: Goal: Risk for activity intolerance will decrease Outcome: Completed/Met   Problem: Nutrition: Goal: Adequate nutrition will be maintained Outcome: Completed/Met   Problem: Coping: Goal: Level of anxiety will decrease Outcome: Completed/Met   Problem: Elimination: Goal: Will not experience complications related to bowel motility Outcome: Completed/Met Goal: Will not experience complications related to urinary retention Outcome:  Completed/Met   Problem: Pain Managment: Goal: General experience of comfort will improve Outcome: Completed/Met   Problem: Safety: Goal: Ability to remain free from injury will improve Outcome: Completed/Met   Problem: Skin Integrity: Goal: Risk for impaired skin integrity will decrease Outcome: Completed/Met   

## 2023-02-19 DIAGNOSIS — M25662 Stiffness of left knee, not elsewhere classified: Secondary | ICD-10-CM | POA: Diagnosis not present

## 2023-02-19 DIAGNOSIS — Z96652 Presence of left artificial knee joint: Secondary | ICD-10-CM | POA: Diagnosis not present

## 2023-02-19 DIAGNOSIS — M6281 Muscle weakness (generalized): Secondary | ICD-10-CM | POA: Diagnosis not present

## 2023-02-23 DIAGNOSIS — M6281 Muscle weakness (generalized): Secondary | ICD-10-CM | POA: Diagnosis not present

## 2023-02-23 DIAGNOSIS — Z96652 Presence of left artificial knee joint: Secondary | ICD-10-CM | POA: Diagnosis not present

## 2023-02-23 DIAGNOSIS — M25662 Stiffness of left knee, not elsewhere classified: Secondary | ICD-10-CM | POA: Diagnosis not present

## 2023-02-25 DIAGNOSIS — M6281 Muscle weakness (generalized): Secondary | ICD-10-CM | POA: Diagnosis not present

## 2023-02-25 DIAGNOSIS — M25662 Stiffness of left knee, not elsewhere classified: Secondary | ICD-10-CM | POA: Diagnosis not present

## 2023-02-25 DIAGNOSIS — Z96652 Presence of left artificial knee joint: Secondary | ICD-10-CM | POA: Diagnosis not present

## 2023-02-27 DIAGNOSIS — M25662 Stiffness of left knee, not elsewhere classified: Secondary | ICD-10-CM | POA: Diagnosis not present

## 2023-02-27 DIAGNOSIS — Z96652 Presence of left artificial knee joint: Secondary | ICD-10-CM | POA: Diagnosis not present

## 2023-02-27 DIAGNOSIS — M1712 Unilateral primary osteoarthritis, left knee: Secondary | ICD-10-CM | POA: Diagnosis not present

## 2023-02-27 DIAGNOSIS — M6281 Muscle weakness (generalized): Secondary | ICD-10-CM | POA: Diagnosis not present

## 2023-03-02 DIAGNOSIS — Z96652 Presence of left artificial knee joint: Secondary | ICD-10-CM | POA: Diagnosis not present

## 2023-03-02 DIAGNOSIS — M25662 Stiffness of left knee, not elsewhere classified: Secondary | ICD-10-CM | POA: Diagnosis not present

## 2023-03-02 DIAGNOSIS — M6281 Muscle weakness (generalized): Secondary | ICD-10-CM | POA: Diagnosis not present

## 2023-03-04 DIAGNOSIS — M25662 Stiffness of left knee, not elsewhere classified: Secondary | ICD-10-CM | POA: Diagnosis not present

## 2023-03-04 DIAGNOSIS — M6281 Muscle weakness (generalized): Secondary | ICD-10-CM | POA: Diagnosis not present

## 2023-03-04 DIAGNOSIS — Z96652 Presence of left artificial knee joint: Secondary | ICD-10-CM | POA: Diagnosis not present

## 2023-03-06 DIAGNOSIS — M25662 Stiffness of left knee, not elsewhere classified: Secondary | ICD-10-CM | POA: Diagnosis not present

## 2023-03-06 DIAGNOSIS — Z96652 Presence of left artificial knee joint: Secondary | ICD-10-CM | POA: Diagnosis not present

## 2023-03-06 DIAGNOSIS — M6281 Muscle weakness (generalized): Secondary | ICD-10-CM | POA: Diagnosis not present

## 2023-03-09 DIAGNOSIS — M25662 Stiffness of left knee, not elsewhere classified: Secondary | ICD-10-CM | POA: Diagnosis not present

## 2023-03-09 DIAGNOSIS — Z96652 Presence of left artificial knee joint: Secondary | ICD-10-CM | POA: Diagnosis not present

## 2023-03-09 DIAGNOSIS — M6281 Muscle weakness (generalized): Secondary | ICD-10-CM | POA: Diagnosis not present

## 2023-03-11 DIAGNOSIS — M25662 Stiffness of left knee, not elsewhere classified: Secondary | ICD-10-CM | POA: Diagnosis not present

## 2023-03-11 DIAGNOSIS — Z96652 Presence of left artificial knee joint: Secondary | ICD-10-CM | POA: Diagnosis not present

## 2023-03-11 DIAGNOSIS — M6281 Muscle weakness (generalized): Secondary | ICD-10-CM | POA: Diagnosis not present

## 2023-03-13 DIAGNOSIS — M6281 Muscle weakness (generalized): Secondary | ICD-10-CM | POA: Diagnosis not present

## 2023-03-13 DIAGNOSIS — M25662 Stiffness of left knee, not elsewhere classified: Secondary | ICD-10-CM | POA: Diagnosis not present

## 2023-03-13 DIAGNOSIS — Z96652 Presence of left artificial knee joint: Secondary | ICD-10-CM | POA: Diagnosis not present

## 2023-03-17 DIAGNOSIS — M6281 Muscle weakness (generalized): Secondary | ICD-10-CM | POA: Diagnosis not present

## 2023-03-17 DIAGNOSIS — Z96652 Presence of left artificial knee joint: Secondary | ICD-10-CM | POA: Diagnosis not present

## 2023-03-17 DIAGNOSIS — M25662 Stiffness of left knee, not elsewhere classified: Secondary | ICD-10-CM | POA: Diagnosis not present

## 2023-03-18 DIAGNOSIS — M6281 Muscle weakness (generalized): Secondary | ICD-10-CM | POA: Diagnosis not present

## 2023-03-18 DIAGNOSIS — Z96652 Presence of left artificial knee joint: Secondary | ICD-10-CM | POA: Diagnosis not present

## 2023-03-18 DIAGNOSIS — M25662 Stiffness of left knee, not elsewhere classified: Secondary | ICD-10-CM | POA: Diagnosis not present

## 2023-03-20 DIAGNOSIS — M6281 Muscle weakness (generalized): Secondary | ICD-10-CM | POA: Diagnosis not present

## 2023-03-20 DIAGNOSIS — M25662 Stiffness of left knee, not elsewhere classified: Secondary | ICD-10-CM | POA: Diagnosis not present

## 2023-03-20 DIAGNOSIS — Z96652 Presence of left artificial knee joint: Secondary | ICD-10-CM | POA: Diagnosis not present

## 2023-03-25 DIAGNOSIS — M25662 Stiffness of left knee, not elsewhere classified: Secondary | ICD-10-CM | POA: Diagnosis not present

## 2023-03-25 DIAGNOSIS — M6281 Muscle weakness (generalized): Secondary | ICD-10-CM | POA: Diagnosis not present

## 2023-03-25 DIAGNOSIS — Z96652 Presence of left artificial knee joint: Secondary | ICD-10-CM | POA: Diagnosis not present

## 2023-03-26 DIAGNOSIS — M6281 Muscle weakness (generalized): Secondary | ICD-10-CM | POA: Diagnosis not present

## 2023-03-26 DIAGNOSIS — Z96652 Presence of left artificial knee joint: Secondary | ICD-10-CM | POA: Diagnosis not present

## 2023-03-26 DIAGNOSIS — M25662 Stiffness of left knee, not elsewhere classified: Secondary | ICD-10-CM | POA: Diagnosis not present

## 2023-03-30 DIAGNOSIS — Z96652 Presence of left artificial knee joint: Secondary | ICD-10-CM | POA: Diagnosis not present

## 2023-03-30 DIAGNOSIS — M25662 Stiffness of left knee, not elsewhere classified: Secondary | ICD-10-CM | POA: Diagnosis not present

## 2023-03-30 DIAGNOSIS — M6281 Muscle weakness (generalized): Secondary | ICD-10-CM | POA: Diagnosis not present

## 2023-04-01 DIAGNOSIS — Z96652 Presence of left artificial knee joint: Secondary | ICD-10-CM | POA: Diagnosis not present

## 2023-04-01 DIAGNOSIS — M6281 Muscle weakness (generalized): Secondary | ICD-10-CM | POA: Diagnosis not present

## 2023-04-01 DIAGNOSIS — M25662 Stiffness of left knee, not elsewhere classified: Secondary | ICD-10-CM | POA: Diagnosis not present

## 2023-04-03 ENCOUNTER — Other Ambulatory Visit: Payer: Self-pay | Admitting: Internal Medicine

## 2023-04-07 DIAGNOSIS — Z96652 Presence of left artificial knee joint: Secondary | ICD-10-CM | POA: Diagnosis not present

## 2023-04-07 DIAGNOSIS — M6281 Muscle weakness (generalized): Secondary | ICD-10-CM | POA: Diagnosis not present

## 2023-04-07 DIAGNOSIS — M25662 Stiffness of left knee, not elsewhere classified: Secondary | ICD-10-CM | POA: Diagnosis not present

## 2023-04-09 DIAGNOSIS — Z96652 Presence of left artificial knee joint: Secondary | ICD-10-CM | POA: Diagnosis not present

## 2023-04-09 DIAGNOSIS — M25662 Stiffness of left knee, not elsewhere classified: Secondary | ICD-10-CM | POA: Diagnosis not present

## 2023-04-09 DIAGNOSIS — M6281 Muscle weakness (generalized): Secondary | ICD-10-CM | POA: Diagnosis not present

## 2023-04-13 ENCOUNTER — Ambulatory Visit (INDEPENDENT_AMBULATORY_CARE_PROVIDER_SITE_OTHER): Payer: Medicare HMO

## 2023-04-13 VITALS — Ht 63.0 in | Wt 200.0 lb

## 2023-04-13 DIAGNOSIS — Z0000 Encounter for general adult medical examination without abnormal findings: Secondary | ICD-10-CM

## 2023-04-13 NOTE — Patient Instructions (Addendum)
Crystal [name] , Thank you for taking time to come for your Medicare Wellness Visit. I appreciate your ongoing commitment to your health goals. Please review the following plan we discussed and let me know if I can assist you in the future.   These are the goals we discussed:  Goals      My goal for 2024 is to get back into the water and not have to depend on using a cane.        This is a list of the screening recommended for you and due dates:  Health Maintenance  Topic Date Due   Yearly kidney health urinalysis for diabetes  Never done   Zoster (Shingles) Vaccine (1 of 2) Never done   COVID-19 Vaccine (5 - 2023-24 season) 06/20/2022   DTaP/Tdap/Td vaccine (3 - Td or Tdap) 02/18/2023   Colon Cancer Screening  06/08/2023   Hemoglobin A1C  05/21/2023   Eye exam for diabetics  07/08/2023   Screening for Lung Cancer  07/18/2023   Mammogram  09/03/2023   Complete foot exam   10/23/2023   Yearly kidney function blood test for diabetes  02/05/2024   Medicare Annual Wellness Visit  04/12/2024   Pneumonia Vaccine  Completed   DEXA scan (bone density measurement)  Completed   Hepatitis C Screening  Completed   HPV Vaccine  Aged Out   Flu Shot  Discontinued    Advanced directives: Yes; Documents on file with [facility].  Conditions/risks identified: Yes  Next appointment: It was nice speaking with you today!  Please follow up in one year for your annual wellness visit via telephone call with Nurse Percell Miller on 04/18/2024 at 10:15 a.m.  If you need to cancel or reschedule please call [phone].   Preventive Care 59 Years and Older, Female Preventive care refers to lifestyle choices and visits with your health care provider that can promote health and wellness. What does preventive care include? A yearly physical exam. This is also called an annual well check. Dental exams once or twice a year. Routine eye exams. Ask your health care provider how often you should have your eyes  checked. Personal lifestyle choices, including: Daily care of your teeth and gums. Regular physical activity. Eating a healthy diet. Avoiding tobacco and drug use. Limiting alcohol use. Practicing safe sex. Taking low-dose aspirin every day. Taking vitamin and mineral supplements as recommended by your health care provider. What happens during an annual well check? The services and screenings done by your health care provider during your annual well check will depend on your age, overall health, lifestyle risk factors, and family history of disease. Counseling  Your health care provider may ask you questions about your: Alcohol use. Tobacco use. Drug use. Emotional well-being. Home and relationship well-being. Sexual activity. Eating habits. History of falls. Memory and ability to understand (cognition). Work and work Astronomer. Reproductive health. Screening  You may have the following tests or measurements: Height, weight, and BMI. Blood pressure. Lipid and cholesterol levels. These may be checked every 5 years, or more frequently if you are over 56 years old. Skin check. Lung cancer screening. You may have this screening every year starting at age 50 if you have a 30-pack-year history of smoking and currently smoke or have quit within the past 15 years. Fecal occult blood test (FOBT) of the stool. You may have this test every year starting at age 79. Flexible sigmoidoscopy or colonoscopy. You may have a sigmoidoscopy every 5 years  or a colonoscopy every 10 years starting at age 51. Hepatitis C blood test. Hepatitis B blood test. Sexually transmitted disease (STD) testing. Diabetes screening. This is done by checking your blood sugar (glucose) after you have not eaten for a while (fasting). You may have this done every 1-3 years. Bone density scan. This is done to screen for osteoporosis. You may have this done starting at age 93. Mammogram. This may be done every 1-2  years. Talk to your health care provider about how often you should have regular mammograms. Talk with your health care provider about your test results, treatment options, and if necessary, the need for more tests. Vaccines  Your health care provider may recommend certain vaccines, such as: Influenza vaccine. This is recommended every year. Tetanus, diphtheria, and acellular pertussis (Tdap, Td) vaccine. You may need a Td booster every 10 years. Zoster vaccine. You may need this after age 62. Pneumococcal 13-valent conjugate (PCV13) vaccine. One dose is recommended after age 54. Pneumococcal polysaccharide (PPSV23) vaccine. One dose is recommended after age 7. Talk to your health care provider about which screenings and vaccines you need and how often you need them. This information is not intended to replace advice given to you by your health care provider. Make sure you discuss any questions you have with your health care provider. Document Released: 11/02/2015 Document Revised: 06/25/2016 Document Reviewed: 08/07/2015 Elsevier Interactive Patient Education  2017 [place] Prevention in the Home Falls can cause injuries. They can happen to people of all ages. There are many things you can do to make your home safe and to help prevent falls. What can I do on the outside of my home? Regularly fix the edges of walkways and driveways and fix any cracks. Remove anything that might make you trip as you walk through a door, such as a raised step or threshold. Trim any bushes or trees on the path to your home. Use bright outdoor lighting. Clear any walking paths of anything that might make someone trip, such as rocks or tools. Regularly check to see if handrails are loose or broken. Make sure that both sides of any steps have handrails. Any raised decks and porches should have guardrails on the edges. Have any leaves, snow, or ice cleared regularly. Use sand or salt on walking paths  during winter. Clean up any spills in your garage right away. This includes oil or grease spills. What can I do in the bathroom? Use night lights. Install grab bars by the toilet and in the tub and shower. Do not use towel bars as grab bars. Use non-skid mats or decals in the tub or shower. If you need to sit down in the shower, use a plastic, non-slip stool. Keep the floor dry. Clean up any water that spills on the floor as soon as it happens. Remove soap buildup in the tub or shower regularly. Attach bath mats securely with double-sided non-slip rug tape. Do not have throw rugs and other things on the floor that can make you trip. What can I do in the bedroom? Use night lights. Make sure that you have a light by your bed that is easy to reach. Do not use any sheets or blankets that are too big for your bed. They should not hang down onto the floor. Have a firm chair that has side arms. You can use this for support while you get dressed. Do not have throw rugs and other things on the floor  that can make you trip. What can I do in the kitchen? Clean up any spills right away. Avoid walking on wet floors. Keep items that you use a lot in easy-to-reach places. If you need to reach something above you, use a strong step stool that has a grab bar. Keep electrical cords out of the way. Do not use floor polish or wax that makes floors slippery. If you must use wax, use non-skid floor wax. Do not have throw rugs and other things on the floor that can make you trip. What can I do with my stairs? Do not leave any items on the stairs. Make sure that there are handrails on both sides of the stairs and use them. Fix handrails that are broken or loose. Make sure that handrails are as long as the stairways. Check any carpeting to make sure that it is firmly attached to the stairs. Fix any carpet that is loose or worn. Avoid having throw rugs at the top or bottom of the stairs. If you do have throw  rugs, attach them to the floor with carpet tape. Make sure that you have a light switch at the top of the stairs and the bottom of the stairs. If you do not have them, ask someone to add them for you. What else can I do to help prevent falls? Wear shoes that: Do not have high heels. Have rubber bottoms. Are comfortable and fit you well. Are closed at the toe. Do not wear sandals. If you use a stepladder: Make sure that it is fully opened. Do not climb a closed stepladder. Make sure that both sides of the stepladder are locked into place. Ask someone to hold it for you, if possible. Clearly mark and make sure that you can see: Any grab bars or handrails. First and last steps. Where the edge of each step is. Use tools that help you move around (mobility aids) if they are needed. These include: Canes. Walkers. Scooters. Crutches. Turn on the lights when you go into a dark area. Replace any light bulbs as soon as they burn out. Set up your furniture so you have a clear path. Avoid moving your furniture around. If any of your floors are uneven, fix them. If there are any pets around you, be aware of where they are. Review your medicines with your doctor. Some medicines can make you feel dizzy. This can increase your chance of falling. Ask your doctor what other things that you can do to help prevent falls. This information is not intended to replace advice given to you by your health care provider. Make sure you discuss any questions you have with your health care provider. Document Released: 08/02/2009 Document Revised: 03/13/2016 Document Reviewed: 11/10/2014 Elsevier Interactive Patient Education  2017 [employer].

## 2023-04-13 NOTE — Progress Notes (Signed)
Subjective:   Crystal [name] is a 72 y.o. female who presents for Medicare Annual (Subsequent) preventive examination.  Visit Complete: Virtual  I connected with  Crystal [name] on 04/13/23 by a audio enabled telemedicine application and verified that I am speaking with the correct person using two identifiers.  Patient Location: Home  Provider Location: Office/Clinic  I discussed the limitations of evaluation and management by telemedicine. The patient expressed understanding and agreed to proceed.  Review of Systems     Cardiac Risk Factors include: advanced age (>78men, >37 women);dyslipidemia;family history of premature cardiovascular disease;hypertension;obesity (BMI >30kg/m2)     Objective:    Today's Vitals   04/13/23 0847  Weight: 200 lb (90.7 kg)  Height: 5\' 3"  (1.6 m)  PainSc: 0-No pain   Body mass index is 35.43 kg/m.     04/13/2023    8:50 AM 02/17/2023   11:29 AM 02/05/2023   10:00 AM 04/07/2022   11:20 AM 09/11/2020    4:00 PM 09/11/2020    5:41 AM 07/27/2017   10:30 AM  Advanced Directives  Does Patient Have a Medical Advance Directive? Yes Yes Yes Yes No No Yes  Type of Estate agent of [place];Living will Healthcare Power of [place];Living will Healthcare Power of [place];Living will Healthcare Power of [place];Living will   Healthcare Power of [place];Living will  Does patient want to make changes to medical advance directive? No - Patient declined No - Patient declined       Copy of Healthcare Power of Attorney in Chart? Yes - validated most recent copy scanned in chart (See row information) No - copy requested No - copy requested No - copy requested   No - copy requested  Would patient like information on creating a medical advance directive?     No - Patient declined No - Patient declined     Current Medications (verified) Outpatient Encounter Medications as of 04/13/2023  Medication Sig   albuterol (VENTOLIN HFA) 108  (90 Base) MCG/ACT inhaler Inhale 2 puffs into the lungs every 4 (four) hours as needed for wheezing or shortness of breath (coughing fits).   aspirin EC 81 MG tablet Take 1 tablet (81 mg total) by mouth 2 (two) times daily. For 2 weeks then once a day for 2 weeks after that.   atorvastatin (LIPITOR) 40 MG tablet TAKE 1 TABLET BY MOUTH EVERY DAY   Budeson-Glycopyrrol-Formoterol (BREZTRI AEROSPHERE) 160-9-4.8 MCG/ACT AERO Inhale 2 puffs into the lungs in the morning and at bedtime. with spacer and rinse mouth afterwards.   Calcium Carb-Cholecalciferol 600-800 MG-UNIT TABS Take 1 tablet by mouth in the morning and at bedtime.    Cholecalciferol (VITAMIN D PO) Take 2,000 Units by mouth daily.    CRANBERRY PO Take 1 tablet by mouth 3 (three) times a week.   denosumab (PROLIA) 60 MG/ML SOSY injection Inject 60 mg into the skin every 6 (six) months.   folic acid (FOLVITE) 400 MCG tablet Take 400 mcg by mouth daily.   HYDROcodone-acetaminophen (NORCO/VICODIN) 5-325 MG tablet Take 1-2 tablets by mouth every 6 (six) hours as needed for moderate pain or severe pain (post op pain).   loratadine (CLARITIN) 10 MG tablet Take 10 mg by mouth daily.   losartan (COZAAR) 50 MG tablet TAKE 1 TABLET EVERY DAY (ANNUAL APPOINTMENT DUE IN MAY, MUST SEE MD FOR FUTURE REFILLS)   metoprolol succinate (TOPROL-XL) 25 MG 24 hr tablet Take 1 tablet (25 mg total) by mouth daily. Annual appt  is due w/labs must see provider for future refills   NON FORMULARY Pt uses a cpap nightly   tiZANidine (ZANAFLEX) 4 MG tablet Take 1 tablet (4 mg total) by mouth every 6 (six) hours as needed for muscle spasms.   tolterodine (DETROL LA) 4 MG 24 hr capsule Take 4 mg by mouth daily.   No facility-administered encounter medications on file as of 04/13/2023.    Allergies (verified) Tape, Ace inhibitors, and Penicillins   History: Past Medical History:  Diagnosis Date   ALLERGIC RHINITIS    Allergy    ANXIETY    no medations needed    Arthritis    Asthma    Bronchitis    Cataract    no surgery   COLONIC POLYPS, HX OF    Complication of anesthesia    oxygen sat dropped during endoscopy    COPD    pt unsure of this dx   Cough    DEPRESSION    Dyspnea    Elevated coronary artery calcium score 12/2021   Agatston score 173-all LAD   Fatty liver    GERD    GLUCOSE INTOLERANCE    HYPERTENSION    IBS    Impaired glucose tolerance    Obstructive sleep apnea 06/25/2018   OSTEOPENIA    Osteopenia    Osteoporosis 07/30/2017   PERIPHERAL EDEMA    Pneumonia    Pneumonia, organism unspecified(486)    Sleep apnea    Oral Aparatus   TOBACCO USE DISORDER/SMOKER-SMOKING CESSATION DISCUSSED    Past Surgical History:  Procedure Laterality Date   birthmark removed     from back as a child   bladder mesh     2012   CESAREAN SECTION     x2   CHOLECYSTECTOMY     COLONOSCOPY  2018   TA   MOUTH SURGERY  04/25/2020   tissue graft -room of mouth to front gums   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   Procedure: LEFT TOTAL HIP ARTHROPLASTY ANTERIOR APPROACH;  Surgeon: Marcene Corning, MD;  Location: [facility];  Service: Orthopedics;  Laterality: Left;   TOTAL KNEE ARTHROPLASTY Left 02/17/2023   Procedure: LEFT TOTAL KNEE ARTHROPLASTY;  Surgeon: Marcene Corning, MD;  Location: [facility];  Service: Orthopedics;  Laterality: Left;   WISDOM TOOTH EXTRACTION     Family History  Problem Relation Age of Onset   Breast cancer Sister 17       +lump and tamoxifen; eventually had bilateral mastectomies   Allergic rhinitis Sister    Colon cancer Father        dx. >81   Bladder Cancer Father        dx. 72-79; not a smoker   Skin cancer Father        basal cell carcinoma   Colon polyps Father    Colon cancer Paternal Grandfather        unspecified age   Melanoma Paternal Grandfather        unspecified age   Skin cancer Mother        NOS; dx. 78s   Other Mother        hx of hysterectomy in her early 75s for fibroid cysts    Heart attack Maternal Uncle 67   Heart attack Maternal Grandmother 56   Thyroid cancer Paternal Grandmother 36   Breast cancer Sister        dx. early 35s; s/p BL mastectomies   Cervical cancer Sister  dx. late 20s   Skin cancer Brother        NOS   Anxiety disorder Other    Hypothyroidism Other    Hyperlipidemia Other    Diabetes Other    Esophageal cancer Neg Hx    Stomach cancer Neg Hx    Rectal cancer Neg Hx    Social History   Socioeconomic History   Marital status: Widowed    Spouse name: Not on file   Number of children: 2   Years of education: Not on file   Highest education level: Not on file  Occupational History   Occupation: Data processing manager industries, signage for [employer]    Comment: Retired  Tobacco Use   Smoking status: Former    Packs/day: 1.00    Years: 42.00    Additional pack years: 0.00    Total pack years: 42.00    Types: Cigarettes    Start date: 10/20/1977    Quit date: 10/13/2022    Years since quitting: 0.4   Smokeless tobacco: Never  Vaping Use   Vaping Use: Never used  Substance and Sexual Activity   Alcohol use: Yes    Alcohol/week: 21.0 standard drinks of alcohol    Types: 7 Glasses of wine, 14 Standard drinks or equivalent per week    Comment: couple of drinks 1-2 nites per week    Drug use: No   Sexual activity: Not on file  Other Topics Concern   Not on file  Social History Narrative   She indicates that she has been most of her life sedentary, but started exercising in the fall of October 2022: Has started doing water aerobics.      She had work up to 4 days a week, but has had to scale down to about 2 days a week because of knee pain.   Social Determinants of Health   Financial Resource Strain: Low Risk  (04/13/2023)   Overall Financial Resource Strain (CARDIA)    Difficulty of Paying Living Expenses: Not hard at all  Food Insecurity: No Food Insecurity (04/13/2023)   Hunger Vital Sign    Worried About  Running Out of Food in the Last Year: Never true    Ran Out of Food in the Last Year: Never true  Transportation Needs: No Transportation Needs (04/13/2023)   PRAPARE - Administrator, Civil Service (Medical): No    Lack of Transportation (Non-Medical): No  Physical Activity: Sufficiently Active (04/13/2023)   Exercise Vital Sign    Days of Exercise per Week: 4 days    Minutes of Exercise per Session: 60 min  Stress: No Stress Concern Present (04/13/2023)   [employer] of Occupational Health - Occupational Stress Questionnaire    Feeling of Stress : Not at all  Social Connections: Socially Isolated (04/13/2023)   Social Connection and Isolation Panel [NHANES]    Frequency of Communication with Friends and Family: Three times a week    Frequency of Social Gatherings with Friends and Family: Three times a week    Attends Religious Services: Never    Active Member of Clubs or Organizations: No    Attends Banker Meetings: Never    Marital Status: Widowed    Tobacco Counseling Counseling given: Not Answered   Clinical Intake:  Pre-visit preparation completed: Yes  Pain : No/denies pain Pain Score: 0-No pain     BMI - recorded: 35.43 Nutritional Status: BMI > 30  Obese Nutritional Risks: None  Diabetes: No  How often do you need to have someone help you when you read instructions, pamphlets, or other written materials from your doctor or pharmacy?: 1 - Never What is the last grade level you completed in school?: College Graduate  Interpreter Needed?: No  Information entered by :: Cristi Gwynn N. Raeden Belzer, LPN.   Activities of Daily Living    04/13/2023    8:53 AM 02/17/2023   11:29 AM  In your present state of health, do you have any difficulty performing the following activities:  Hearing? 0 0  Vision? 0 0  Difficulty concentrating or making decisions? 0 0  Walking or climbing stairs? 0 1  Dressing or bathing? 0 0  Doing errands, shopping? 0 0   Preparing Food and eating ? N   Using the Toilet? N   In the past six months, have you accidently leaked urine? N   Do you have problems with loss of bowel control? N   Managing your Medications? N   Managing your Finances? N   Housekeeping or managing your Housekeeping? N     Patient Care Team: Corwin Levins, MD as PCP - General (Internal Medicine) Marykay Lex, MD as PCP - Cardiology (Cardiology)  Indicate any recent Medical Services you may have received from other than [facility] providers in the past year (date may be approximate).     Assessment:   This is a routine wellness examination for Peytyn.  Hearing/Vision screen Hearing Screening - Comments:: Denies hearing difficulties.   Vision Screening - Comments:: Wears rx glasses - up to date with routine eye exams with Blair Hailey, DO   Dietary issues and exercise activities discussed:     Goals Addressed             This Visit's Progress    My goal for 2024 is to get back into the water and not have to depend on using a cane.        Depression Screen    04/13/2023    8:52 AM 11/20/2022    9:02 AM 04/24/2022    9:48 AM 04/07/2022   11:21 AM 04/07/2022   11:19 AM 03/13/2021   10:53 AM 03/13/2021   10:33 AM  PHQ 2/9 Scores  PHQ - 2 Score 0 0 0 0 0 1 0  PHQ- 9 Score 0 0 3        Fall Risk    04/13/2023    8:52 AM 11/20/2022    9:02 AM 04/24/2022    9:47 AM 04/07/2022   11:21 AM 03/13/2021   10:52 AM  Fall Risk   Falls in the past year? 0 0 0 0 0  Number falls in past yr: 0 0  0 0  Injury with Fall? 0 0 0 0 0  Risk for fall due to : No Fall Risks No Fall Risks     Follow up Falls prevention discussed Falls evaluation completed  Falls evaluation completed   Comment    prn cane     MEDICARE RISK AT HOME:  Medicare Risk at Home - 04/13/23 0854     Any stairs in or around the home? No    If so, are there any without handrails? No    Home free of loose throw rugs in walkways, pet beds, electrical cords, etc? Yes     Adequate lighting in your home to reduce risk of falls? Yes    Life alert? No    Use of a  cane, walker or w/c? Yes    Grab bars in the bathroom? Yes    Shower chair or bench in shower? Yes    Elevated toilet seat or a handicapped toilet? Yes             TIMED UP AND GO:  Was the test performed?  No    Cognitive Function:        04/13/2023    8:52 AM  6CIT Screen  What Year? 0 points  What month? 0 points  What time? 0 points  Count back from 20 0 points  Months in reverse 0 points  Repeat phrase 0 points  Total Score 0 points    Immunizations Immunization History  Administered Date(s) Administered   Hepatitis B, ADULT 01/23/2017, 02/23/2017   Hepatitis B, PED/ADOLESCENT 07/27/2017   PFIZER Comirnaty(Gray Top)Covid-19 Tri-Sucrose Vaccine 02/10/2021   PFIZER(Purple Top)SARS-COV-2 Vaccination 12/13/2019, 12/27/2019, 08/25/2020   Pneumococcal Conjugate-13 03/05/2016   Pneumococcal Polysaccharide-23 03/11/2017   Td 12/21/2006   Tdap 02/17/2013    TDAP status: Due, Education has been provided regarding the importance of this vaccine. Advised may receive this vaccine at local pharmacy or Health Dept. Aware to provide a copy of the vaccination record if obtained from local pharmacy or Health Dept. Verbalized acceptance and understanding.  Flu Vaccine status: Declined, Education has been provided regarding the importance of this vaccine but patient still declined. Advised may receive this vaccine at local pharmacy or Health Dept. Aware to provide a copy of the vaccination record if obtained from local pharmacy or Health Dept. Verbalized acceptance and understanding.  Pneumococcal vaccine status: Up to date  Covid-19 vaccine status: Completed vaccines  Qualifies for Shingles Vaccine? Yes   Zostavax completed No   Shingrix Completed?: No.    Education has been provided regarding the importance of this vaccine. Patient has been advised to call insurance company to  determine out of pocket expense if they have not yet received this vaccine. Advised may also receive vaccine at local pharmacy or Health Dept. Verbalized acceptance and understanding.  Screening Tests Health Maintenance  Topic Date Due   Diabetic kidney evaluation - Urine ACR  Never done   Zoster Vaccines- Shingrix (1 of 2) Never done   COVID-19 Vaccine (5 - 2023-24 season) 06/20/2022   DTaP/Tdap/Td (3 - Td or Tdap) 02/18/2023   Colonoscopy  06/08/2023   HEMOGLOBIN A1C  05/21/2023   OPHTHALMOLOGY EXAM  07/08/2023   Lung Cancer Screening  07/18/2023   MAMMOGRAM  09/03/2023   FOOT EXAM  10/23/2023   Diabetic kidney evaluation - eGFR measurement  02/05/2024   Medicare Annual Wellness (AWV)  04/12/2024   Pneumonia Vaccine 48+ Years old  Completed   DEXA SCAN  Completed   Hepatitis C Screening  Completed   HPV VACCINES  Aged Out   INFLUENZA VACCINE  Discontinued    Health Maintenance  Health Maintenance Due  Topic Date Due   Diabetic kidney evaluation - Urine ACR  Never done   Zoster Vaccines- Shingrix (1 of 2) Never done   COVID-19 Vaccine (5 - 2023-24 season) 06/20/2022   DTaP/Tdap/Td (3 - Td or Tdap) 02/18/2023   Colonoscopy  06/08/2023    Colorectal cancer screening: Type of screening: Colonoscopy. Completed 06/07/2020. Repeat every 3 years  Mammogram status: Completed 09/17/2022. Repeat every year  Bone Density status: Completed 06/13/2020. Results reflect: Bone density results: OSTEOPOROSIS. Repeat every 2 years.  Lung Cancer Screening: (Low Dose CT Chest recommended if Age 109-80 years, 57  pack-year currently smoking OR have quit w/in 15years.) does qualify.   Lung Cancer Screening Referral: scheduled for 07/20/2023  Additional Screening:  Hepatitis C Screening: does qualify; Completed 10/18/2015  Vision Screening: Recommended annual ophthalmology exams for early detection of glaucoma and other disorders of the eye. Is the patient up to date with their annual eye exam?   Yes  Who is the provider or what is the name of the office in which the patient attends annual eye exams? Blair Hailey, DO. If pt is not established with a provider, would they like to be referred to a provider to establish care? No .   Dental Screening: Recommended annual dental exams for proper oral hygiene  Diabetic Foot Exam: N/A  Community Resource Referral / Chronic Care Management: CRR required this visit?  No   CCM required this visit?  No     Plan:     I have personally reviewed and noted the following in the patient's chart:   Medical and social history Use of alcohol, tobacco or illicit drugs  Current medications and supplements including opioid prescriptions. Patient is currently taking opioid prescriptions. Information provided to patient regarding non-opioid alternatives. Patient advised to discuss non-opioid treatment plan with their provider. Functional ability and status Nutritional status Physical activity Advanced directives List of other physicians Hospitalizations, surgeries, and ER visits in previous 12 months Vitals Screenings to include cognitive, depression, and falls Referrals and appointments  In addition, I have reviewed and discussed with patient certain preventive protocols, quality metrics, and best practice recommendations. A written personalized care plan for preventive services as well as general preventive health recommendations were provided to patient.     Mickeal Needy, LPN   0/25/4270   After Visit Summary: (Mail) Due to this being a telephonic visit, the after visit summary with patients personalized plan was offered to patient via mail   Nurse Notes: Normal cognitive status assessed by direct observation via telephone conversation by this Nurse Health Advisor. No abnormalities found.

## 2023-04-15 DIAGNOSIS — M1611 Unilateral primary osteoarthritis, right hip: Secondary | ICD-10-CM | POA: Diagnosis not present

## 2023-04-15 DIAGNOSIS — M1712 Unilateral primary osteoarthritis, left knee: Secondary | ICD-10-CM | POA: Diagnosis not present

## 2023-04-20 ENCOUNTER — Other Ambulatory Visit (INDEPENDENT_AMBULATORY_CARE_PROVIDER_SITE_OTHER): Payer: Medicare HMO

## 2023-04-20 ENCOUNTER — Other Ambulatory Visit: Payer: Medicare HMO

## 2023-04-20 DIAGNOSIS — E538 Deficiency of other specified B group vitamins: Secondary | ICD-10-CM | POA: Diagnosis not present

## 2023-04-20 DIAGNOSIS — R7302 Impaired glucose tolerance (oral): Secondary | ICD-10-CM | POA: Diagnosis not present

## 2023-04-20 DIAGNOSIS — E785 Hyperlipidemia, unspecified: Secondary | ICD-10-CM

## 2023-04-20 DIAGNOSIS — E559 Vitamin D deficiency, unspecified: Secondary | ICD-10-CM

## 2023-04-20 LAB — TSH: TSH: 1.06 u[IU]/mL (ref 0.35–5.50)

## 2023-04-20 LAB — HEPATIC FUNCTION PANEL
ALT: 13 U/L (ref 0–35)
AST: 14 U/L (ref 0–37)
Albumin: 4 g/dL (ref 3.5–5.2)
Alkaline Phosphatase: 65 U/L (ref 39–117)
Bilirubin, Direct: 0.1 mg/dL (ref 0.0–0.3)
Total Bilirubin: 0.5 mg/dL (ref 0.2–1.2)
Total Protein: 7.1 g/dL (ref 6.0–8.3)

## 2023-04-20 LAB — URINALYSIS, ROUTINE W REFLEX MICROSCOPIC
Bilirubin Urine: NEGATIVE
Hgb urine dipstick: NEGATIVE
Ketones, ur: NEGATIVE
Leukocytes,Ua: NEGATIVE
Nitrite: NEGATIVE
RBC / HPF: NONE SEEN (ref 0–?)
Specific Gravity, Urine: 1.02 (ref 1.000–1.030)
Total Protein, Urine: NEGATIVE
Urine Glucose: NEGATIVE
Urobilinogen, UA: 0.2 (ref 0.0–1.0)
pH: 6 (ref 5.0–8.0)

## 2023-04-20 LAB — CBC WITH DIFFERENTIAL/PLATELET
Basophils Absolute: 0.1 10*3/uL (ref 0.0–0.1)
Basophils Relative: 0.8 % (ref 0.0–3.0)
Eosinophils Absolute: 0.2 10*3/uL (ref 0.0–0.7)
Eosinophils Relative: 2.4 % (ref 0.0–5.0)
HCT: 40.3 % (ref 36.0–46.0)
Hemoglobin: 13.3 g/dL (ref 12.0–15.0)
Lymphocytes Relative: 31 % (ref 12.0–46.0)
Lymphs Abs: 2.1 10*3/uL (ref 0.7–4.0)
MCHC: 33 g/dL (ref 30.0–36.0)
MCV: 92.7 fl (ref 78.0–100.0)
Monocytes Absolute: 0.7 10*3/uL (ref 0.1–1.0)
Monocytes Relative: 10.5 % (ref 3.0–12.0)
Neutro Abs: 3.8 10*3/uL (ref 1.4–7.7)
Neutrophils Relative %: 55.3 % (ref 43.0–77.0)
Platelets: 341 10*3/uL (ref 150.0–400.0)
RBC: 4.34 Mil/uL (ref 3.87–5.11)
RDW: 13.9 % (ref 11.5–15.5)
WBC: 6.9 10*3/uL (ref 4.0–10.5)

## 2023-04-20 LAB — BASIC METABOLIC PANEL
BUN: 12 mg/dL (ref 6–23)
CO2: 28 mEq/L (ref 19–32)
Calcium: 10 mg/dL (ref 8.4–10.5)
Chloride: 102 mEq/L (ref 96–112)
Creatinine, Ser: 0.68 mg/dL (ref 0.40–1.20)
GFR: 86.66 mL/min (ref >60.00)
Glucose, Bld: 90 mg/dL (ref 70–99)
Potassium: 4.6 mEq/L (ref 3.5–5.1)
Sodium: 139 mEq/L (ref 135–145)

## 2023-04-20 LAB — MICROALBUMIN / CREATININE URINE RATIO
Creatinine,U: 90.1 mg/dL
Microalb Creat Ratio: 0.8 mg/g (ref 0.0–30.0)
Microalb, Ur: <0.7 mg/dL (ref 0.0–1.9)

## 2023-04-20 LAB — VITAMIN B12: Vitamin B-12: 581 pg/mL (ref 211–911)

## 2023-04-20 LAB — HEMOGLOBIN A1C: Hgb A1c MFr Bld: 5.2 % (ref 4.6–6.5)

## 2023-04-20 LAB — LIPID PANEL
Cholesterol: 144 mg/dL (ref 0–200)
HDL: 75.5 mg/dL (ref >39.00)
LDL Cholesterol: 60 mg/dL (ref 0–99)
NonHDL: 68.82
Total CHOL/HDL Ratio: 2
Triglycerides: 44 mg/dL (ref 0.0–149.0)
VLDL: 8.8 mg/dL (ref 0.0–40.0)

## 2023-04-20 LAB — VITAMIN D 25 HYDROXY (VIT D DEFICIENCY, FRACTURES): VITD: 50.92 ng/mL (ref 30.00–100.00)

## 2023-04-22 ENCOUNTER — Encounter: Payer: Medicare HMO | Admitting: Internal Medicine

## 2023-04-24 ENCOUNTER — Encounter: Payer: Self-pay | Admitting: Gastroenterology

## 2023-04-30 NOTE — Progress Notes (Signed)
[facility] Cancer Follow up Visit:  Patient Care Team: Corwin Levins, MD as PCP - General (Internal Medicine) Marykay Lex, MD as PCP - Cardiology (Cardiology)  CHIEF COMPLAINTS/PURPOSE OF CONSULTATION:  HISTORY OF PRESENTING ILLNESS: Crystal [name] 72 y.o. female is here because of eosinophilia.    Medical history notable for allergic rhinitis, tobacco use, OSA on CPAP with good compliance  April 18, 2022: WBC 7.9 hemoglobin 13.1 platelet count 248; 38 segs 26 lymphs 9 monos 27 eos 1 basophil absolute eosinophil count 2.1  May 26, 2022: Allergist evaluation.  Diagnosed with Asthma WBC 8.0 hemoglobin 14.0 platelet count 285; 40 segs 24 lymphs 11 monos 1 base out 24 eos absolute eosinophil count 1.9 Placed on Breztri  and [name]   May 29 2022:  Stool O&P negative.   Serology for Strongyloides ease, HIV were negative. ESR 14, CRP 1  ANA negative ANCA negative. IgG 962 IgA 211 IgM 104 IgE 71 Vitamin B12 1683 tryptase 7.6 CMP notable for sodium 145  June 27 2022: [facility] Hematology Consult  Patient reports that eosinophil count was first noted to be elevated in June 2023; she noted increased cough that week which she related to smoke from [place]. July 23rd to 28th 2023 vacationed in [place] and noted increased cough at night.  Saw PCP on August 7th because the cough and sinus drainage had worsened.  Patient has history of COPD but was never diagnosed with asthma until August.  She has not been on any antibiotics.  Respiratory symptoms in form of cough, chest tightness and nasal congestion have improved since starting Breztri and [name].   Only new medications are those scribed by allergist.  Due of Prolia in October 2023.   Last mammogram was October 2022 which was negative She has history of colon polyps.  Last meeting with genetic counselor was 12 yrs ago July 17 2022 due for CT lung screening.    Social:  Widowed.  Former  smoker.  EtOH formerly heavy drinker now down to 1 to 2 nights a week.    FMH:  Brother recently noted to have elevated eosinophil count Both sisters have history of breast cancer Father had colon and bladder cancer WBC 8.9 hemoglobin 13.8 MCV 94 platelet count 288; 53 segs 25 lymphs 8 monos 13 eos absolute eosinophil count 1.1 CMP notable for glucose 106 calcium 10.6 albumin 4.4 ACE 46 CCP for rheumatoid factor 11.9 tissue transglutaminase IgA undetectable Urine histoplasma antigen negative. Trichinella IgG negative Toxocara IgG negative FISH for BCR able negative  Jak 2 panel showed a TET 2 p.CYS [id number] variant of potential clinical significance and a variant of unknown clinical significance CUX1 [id number] The following genes are NEGATIVE for clinically relevant mutations. Mutational hotspots and surrounding exonic regions were interrogated for DNA level point mutations and indels (fusions not assayed). ABL1, ANKRD26, ASXL1, ATRX, BCOR, BCORL1, BRAF, CALR, CBL, CCND2, CDKN2A, CEBPA, CSF3R, DDX41, DNMT3A, ETNK1, ETV6, EZH2, FBXW7, FLT3, GATA2, HRAS, IDH1, IDH2, JAK2, KDM6A, KIT, KMT2A, KRAS, MAP2K1, MPL, MYD88, NF1, NPM1, NRAS, PDGFRA, PHF6, PTEN, PTPN11, RUNX1, SETBP1, SF3B1, SRSF2, STAG2, TP53, U2AF1, WT1, ZRSR2  July 25 2022:   Reviewed results of labs with patient.  Asthma under good control with [facility]  October 31 2022:   Feels well.  Asthma under good control with Breztri.  Has not required use of rescue inhaler for several months.  In need for replacements of right hip and left knee.  Left hip was replaced  about 2 yrs ago.  Will likely have left knee replaced first so that she has one good leg for transfers during recovery.    WBC 7.2 hemoglobin 13.6 platelet count 269; 52 segs 33 lymphs 9 monos 5 eos 1 basophil  At next visit discuss referral for genetic counselling  February 17 2023:  Left knee arthroplasty  May 01 2023:  Scheduled follow up for management of  eosinophilia.  Not experiencing any pruritus.  Asthma under good control.   Absolute eosinophil count 0.22 July 2023:  For colonoscopy  November 2024:  Screening mammogram.  Right hip arthroplasty  Review of Systems  Constitutional:  Negative for fatigue, fever and unexpected weight change.       Occasional fleeting chills.    HENT:   Positive for voice change. Negative for mouth sores, nosebleeds, sore throat and trouble swallowing.        Voice change since developing the cough  Eyes:  Negative for eye problems and icterus.  Respiratory:  Negative for hemoptysis, shortness of breath and wheezing.        Chest tightness and cough have improved with medication  Cardiovascular:  Negative for chest pain and leg swelling.  Gastrointestinal:  Negative for abdominal pain, blood in stool, constipation, diarrhea, nausea and vomiting.  Endocrine: Positive for hot flashes.  Genitourinary:  Negative for dysuria, frequency and hematuria.   Musculoskeletal:  Negative for back pain, gait problem and myalgias.       Chronic mild arthralgias  Skin:  Negative for itching and rash.  Neurological:  Negative for dizziness, gait problem, seizures and speech difficulty.  Hematological:  Negative for adenopathy. Bruises/bleeds easily.  Psychiatric/Behavioral:  Negative for depression, sleep disturbance and suicidal ideas.     MEDICAL HISTORY: Past Medical History:  Diagnosis Date   ALLERGIC RHINITIS    Allergy    ANXIETY    no medations needed   Arthritis    Asthma    Bronchitis    Cataract    no surgery   COLONIC POLYPS, HX OF    Complication of anesthesia    oxygen sat dropped during endoscopy    COPD    pt unsure of this dx   Cough    DEPRESSION    Dyspnea    Elevated coronary artery calcium score 12/2021   Agatston score 173-all LAD   Fatty liver    GERD    GLUCOSE INTOLERANCE    HYPERTENSION    IBS    Impaired glucose tolerance    Obstructive sleep apnea 06/25/2018    OSTEOPENIA    Osteopenia    Osteoporosis 07/30/2017   PERIPHERAL EDEMA    Pneumonia    Pneumonia, organism unspecified(486)    Sleep apnea    Oral Aparatus   TOBACCO USE DISORDER/SMOKER-SMOKING CESSATION DISCUSSED     SURGICAL HISTORY: Past Surgical History:  Procedure Laterality Date   birthmark removed     from back as a child   bladder mesh     2012   CESAREAN SECTION     x2   CHOLECYSTECTOMY     COLONOSCOPY  2018   TA   MOUTH SURGERY  04/25/2020   tissue graft -room of mouth to front gums   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   Procedure: LEFT TOTAL HIP ARTHROPLASTY ANTERIOR APPROACH;  Surgeon: Marcene Corning, MD;  Location: [facility];  Service: Orthopedics;  Laterality: Left;   TOTAL KNEE ARTHROPLASTY Left 02/17/2023   Procedure: LEFT TOTAL KNEE  ARTHROPLASTY;  Surgeon: Marcene Corning, MD;  Location: [facility];  Service: Orthopedics;  Laterality: Left;   WISDOM TOOTH EXTRACTION      SOCIAL HISTORY: Social History   Socioeconomic History   Marital status: Widowed    Spouse name: Not on file   Number of children: 2   Years of education: Not on file   Highest education level: Not on file  Occupational History   Occupation: Data processing manager industries, signage for [employer]    Comment: Retired  Tobacco Use   Smoking status: Former    Current packs/day: 0.00    Average packs/day: 1 pack/day for 45.0 years (45.0 ttl pk-yrs)    Types: Cigarettes    Start date: 10/20/1977    Quit date: 10/13/2022    Years since quitting: 0.6   Smokeless tobacco: Never  Vaping Use   Vaping status: Never Used  Substance and Sexual Activity   Alcohol use: Yes    Alcohol/week: 21.0 standard drinks of alcohol    Types: 7 Glasses of wine, 14 Standard drinks or equivalent per week    Comment: couple of drinks 1-2 nites per week    Drug use: No   Sexual activity: Not on file  Other Topics Concern   Not on file  Social History Narrative   She indicates that she has been most  of her life sedentary, but started exercising in the fall of October 2022: Has started doing water aerobics.      She had work up to 4 days a week, but has had to scale down to about 2 days a week because of knee pain.   Social Determinants of Health   Financial Resource Strain: Low Risk  (04/13/2023)   Overall Financial Resource Strain (CARDIA)    Difficulty of Paying Living Expenses: Not hard at all  Food Insecurity: No Food Insecurity (04/13/2023)   Hunger Vital Sign    Worried About Running Out of Food in the Last Year: Never true    Ran Out of Food in the Last Year: Never true  Transportation Needs: No Transportation Needs (04/13/2023)   PRAPARE - Administrator, Civil Service (Medical): No    Lack of Transportation (Non-Medical): No  Physical Activity: Sufficiently Active (04/13/2023)   Exercise Vital Sign    Days of Exercise per Week: 4 days    Minutes of Exercise per Session: 60 min  Stress: No Stress Concern Present (04/13/2023)   [employer] of Occupational Health - Occupational Stress Questionnaire    Feeling of Stress : Not at all  Social Connections: Socially Isolated (04/13/2023)   Social Connection and Isolation Panel [NHANES]    Frequency of Communication with Friends and Family: Three times a week    Frequency of Social Gatherings with Friends and Family: Three times a week    Attends Religious Services: Never    Active Member of Clubs or Organizations: No    Attends Banker Meetings: Never    Marital Status: Widowed  Intimate Partner Violence: Not At Risk (04/13/2023)   Humiliation, Afraid, Rape, and Kick questionnaire    Fear of Current or Ex-Partner: No    Emotionally Abused: No    Physically Abused: No    Sexually Abused: No    FAMILY HISTORY Family History  Problem Relation Age of Onset   Breast cancer Sister 40       +lump and tamoxifen; eventually had bilateral mastectomies   Allergic rhinitis Sister  Colon cancer Father         dx. >79   Bladder Cancer Father        dx. 27-79; not a smoker   Skin cancer Father        basal cell carcinoma   Colon polyps Father    Colon cancer Paternal Grandfather        unspecified age   Melanoma Paternal Grandfather        unspecified age   Skin cancer Mother        NOS; dx. 7s   Other Mother        hx of hysterectomy in her early 59s for fibroid cysts   Heart attack Maternal Uncle 67   Heart attack Maternal Grandmother 68   Thyroid cancer Paternal Grandmother 42   Breast cancer Sister        dx. early 66s; s/p BL mastectomies   Cervical cancer Sister        dx. late 64s   Skin cancer Brother        NOS   Anxiety disorder Other    Hypothyroidism Other    Hyperlipidemia Other    Diabetes Other    Esophageal cancer Neg Hx    Stomach cancer Neg Hx    Rectal cancer Neg Hx     ALLERGIES:  is allergic to tape, ace inhibitors, and penicillins.  MEDICATIONS:  Current Outpatient Medications  Medication Sig Dispense Refill   albuterol (VENTOLIN HFA) 108 (90 Base) MCG/ACT inhaler Inhale 2 puffs into the lungs every 4 (four) hours as needed for wheezing or shortness of breath (coughing fits). 18 g 1   aspirin EC 81 MG tablet Take 1 tablet (81 mg total) by mouth 2 (two) times daily. For 2 weeks then once a day for 2 weeks after that. 45 tablet 0   atorvastatin (LIPITOR) 40 MG tablet TAKE 1 TABLET BY MOUTH EVERY DAY 90 tablet 3   Budeson-Glycopyrrol-Formoterol (BREZTRI AEROSPHERE) 160-9-4.8 MCG/ACT AERO Inhale 2 puffs into the lungs in the morning and at bedtime. with spacer and rinse mouth afterwards. 10.7 g 5   Calcium Carb-Cholecalciferol 600-800 MG-UNIT TABS Take 1 tablet by mouth in the morning and at bedtime.      Cholecalciferol (VITAMIN D PO) Take 2,000 Units by mouth daily.      CRANBERRY PO Take 1 tablet by mouth 3 (three) times a week.     denosumab (PROLIA) 60 MG/ML SOSY injection Inject 60 mg into the skin every 6 (six) months. 180 mL 0   folic acid  (FOLVITE) 400 MCG tablet Take 400 mcg by mouth daily.     HYDROcodone-acetaminophen (NORCO/VICODIN) 5-325 MG tablet Take 1-2 tablets by mouth every 6 (six) hours as needed for moderate pain or severe pain (post op pain). 40 tablet 0   loratadine (CLARITIN) 10 MG tablet Take 10 mg by mouth daily.     losartan (COZAAR) 50 MG tablet TAKE 1 TABLET EVERY DAY (ANNUAL APPOINTMENT DUE IN MAY, MUST SEE MD FOR FUTURE REFILLS) 90 tablet 3   NON FORMULARY Pt uses a cpap nightly     tiZANidine (ZANAFLEX) 4 MG tablet Take 1 tablet (4 mg total) by mouth every 6 (six) hours as needed for muscle spasms. 40 tablet 1   tolterodine (DETROL LA) 4 MG 24 hr capsule Take 4 mg by mouth daily.     metoprolol succinate (TOPROL-XL) 25 MG 24 hr tablet TAKE 1 TABLET BY MOUTH DAILY. ANNUAL APPT IS DUE W/LABS  MUST SEE PROVIDER FOR FUTURE REFILLS 90 tablet 3   No current facility-administered medications for this visit.    PHYSICAL EXAMINATION:  ECOG PERFORMANCE STATUS: 0 - Asymptomatic   Vitals:   05/01/23 0939  BP: (!) 169/83  Pulse: 96  Resp: 17  Temp: 99.5 F (37.5 C)  SpO2: 99%     Filed Weights   05/01/23 0939  Weight: 191 lb 6.4 oz (86.8 kg)      Physical Exam Vitals and nursing note reviewed.  Constitutional:      General: She is not in acute distress.    Appearance: Normal appearance. She is obese. She is not ill-appearing, toxic-appearing or diaphoretic.  HENT:     Head: Normocephalic and atraumatic.     Right Ear: External ear normal.     Left Ear: External ear normal.     Nose: Nose normal. No congestion or rhinorrhea.     Mouth/Throat:     Mouth: Mucous membranes are moist.     Pharynx: Oropharynx is clear. No oropharyngeal exudate or posterior oropharyngeal erythema.  Eyes:     General: No scleral icterus.    Extraocular Movements: Extraocular movements intact.     Conjunctiva/sclera: Conjunctivae normal.     Pupils: Pupils are equal, round, and reactive to light.  Cardiovascular:      Rate and Rhythm: Normal rate and regular rhythm.     Heart sounds: No murmur heard.    No friction rub. No gallop.  Pulmonary:     Effort: Pulmonary effort is normal. No respiratory distress.     Breath sounds: Normal breath sounds. No stridor. No wheezing, rhonchi or rales.  Chest:     Chest wall: No tenderness.  Abdominal:     General: Bowel sounds are normal. There is no distension.     Palpations: Abdomen is soft. There is no mass.     Tenderness: There is no abdominal tenderness. There is no guarding or rebound.     Hernia: No hernia is present.  Musculoskeletal:        General: No swelling, tenderness or deformity.     Cervical back: Normal range of motion and neck supple. No rigidity or tenderness.  Lymphadenopathy:     Head:     Right side of head: No submental, submandibular, tonsillar, preauricular, posterior auricular or occipital adenopathy.     Left side of head: No submental, submandibular, tonsillar, preauricular, posterior auricular or occipital adenopathy.     Cervical: No cervical adenopathy.     Right cervical: No superficial, deep or posterior cervical adenopathy.    Left cervical: No superficial, deep or posterior cervical adenopathy.     Upper Body:     Right upper body: No supraclavicular, axillary, pectoral or epitrochlear adenopathy.     Left upper body: No supraclavicular, axillary, pectoral or epitrochlear adenopathy.  Skin:    General: Skin is warm.     Coloration: Skin is not jaundiced.  Neurological:     General: No focal deficit present.     Mental Status: She is alert and oriented to person, place, and time. Mental status is at baseline.     Cranial Nerves: No cranial nerve deficit.     Motor: No weakness.     Gait: Gait normal.  Psychiatric:        Mood and Affect: Mood normal.        Behavior: Behavior normal.        Thought Content: Thought content normal.  Judgment: Judgment normal.      LABORATORY DATA: I have personally  reviewed the data as listed:  Lab on 04/20/2023  Component Date Value Ref Range Status   Vitamin B-12 04/20/2023 581  211 - 911 pg/mL Final   VITD 04/20/2023 50.92  30.00 - 100.00 ng/mL Final   Color, Urine 04/20/2023 YELLOW  Yellow;Lt. Yellow;Straw;Dark Yellow;Amber;Green;Red;Brown Final   APPearance 04/20/2023 CLEAR  Clear;Turbid;Slightly Cloudy;Cloudy Final   Specific Gravity, Urine 04/20/2023 1.020  1.000 - 1.030 Final   pH 04/20/2023 6.0  5.0 - 8.0 Final   Total Protein, Urine 04/20/2023 NEGATIVE  Negative Final   Urine Glucose 04/20/2023 NEGATIVE  Negative Final   Ketones, ur 04/20/2023 NEGATIVE  Negative Final   Bilirubin Urine 04/20/2023 NEGATIVE  Negative Final   Hgb urine dipstick 04/20/2023 NEGATIVE  Negative Final   Urobilinogen, UA 04/20/2023 0.2  0.0 - 1.0 Final   Leukocytes,Ua 04/20/2023 NEGATIVE  Negative Final   Nitrite 04/20/2023 NEGATIVE  Negative Final   WBC, UA 04/20/2023 0-2/hpf  0-2/hpf Final   RBC / HPF 04/20/2023 none seen  0-2/hpf Final   Squamous Epithelial / HPF 04/20/2023 Few(5-10/hpf) (A)  Rare(0-4/hpf) Final   TSH 04/20/2023 1.06  0.35 - 5.50 uIU/mL Final   WBC 04/20/2023 6.9  4.0 - 10.5 K/uL Final   RBC 04/20/2023 4.34  3.87 - 5.11 Mil/uL Final   Hemoglobin 04/20/2023 13.3  12.0 - 15.0 g/dL Final   HCT 11/91/4782 40.3  36.0 - 46.0 % Final   MCV 04/20/2023 92.7  78.0 - 100.0 fl Final   MCHC 04/20/2023 33.0  30.0 - 36.0 g/dL Final   RDW 95/62/1308 13.9  11.5 - 15.5 % Final   Platelets 04/20/2023 341.0  150.0 - 400.0 K/uL Final   Neutrophils Relative % 04/20/2023 55.3  43.0 - 77.0 % Final   Lymphocytes Relative 04/20/2023 31.0  12.0 - 46.0 % Final   Monocytes Relative 04/20/2023 10.5  3.0 - 12.0 % Final   Eosinophils Relative 04/20/2023 2.4  0.0 - 5.0 % Final   Basophils Relative 04/20/2023 0.8  0.0 - 3.0 % Final   Neutro Abs 04/20/2023 3.8  1.4 - 7.7 K/uL Final   Lymphs Abs 04/20/2023 2.1  0.7 - 4.0 K/uL Final   Monocytes Absolute 04/20/2023 0.7  0.1  - 1.0 K/uL Final   Eosinophils Absolute 04/20/2023 0.2  0.0 - 0.7 K/uL Final   Basophils Absolute 04/20/2023 0.1  0.0 - 0.1 K/uL Final   Total Bilirubin 04/20/2023 0.5  0.2 - 1.2 mg/dL Final   Bilirubin, Direct 04/20/2023 0.1  0.0 - 0.3 mg/dL Final   Alkaline Phosphatase 04/20/2023 65  39 - 117 U/L Final   AST 04/20/2023 14  0 - 37 U/L Final   ALT 04/20/2023 13  0 - 35 U/L Final   Total Protein 04/20/2023 7.1  6.0 - 8.3 g/dL Final   Albumin 65/78/4696 4.0  3.5 - 5.2 g/dL Final   Sodium 29/52/8413 139  135 - 145 mEq/L Final   Potassium 04/20/2023 4.6  3.5 - 5.1 mEq/L Final   Chloride 04/20/2023 102  96 - 112 mEq/L Final   CO2 04/20/2023 28  19 - 32 mEq/L Final   Glucose, Bld 04/20/2023 90  70 - 99 mg/dL Final   BUN 24/40/1027 12  6 - 23 mg/dL Final   Creatinine, Ser 04/20/2023 0.68  0.40 - 1.20 mg/dL Final   GFR 25/36/6440 86.66  >60.00 mL/min Final   Calculated using the CKD-EPI Creatinine Equation (2021)  Calcium 04/20/2023 10.0  8.4 - 10.5 mg/dL Final   Cholesterol 16/07/9603 144  0 - 200 mg/dL Final   ATP III Classification       Desirable:  < 200 mg/dL               Borderline High:  200 - 239 mg/dL          High:  > = 540 mg/dL   Triglycerides 98/08/9146 44.0  0.0 - 149.0 mg/dL Final   Normal:  <829 mg/dLBorderline High:  150 - 199 mg/dL   HDL 56/21/3086 57.84  >39.00 mg/dL Final   VLDL 69/62/9528 8.8  0.0 - 41.3 mg/dL Final   LDL Cholesterol 04/20/2023 60  0 - 99 mg/dL Final   Total CHOL/HDL Ratio 04/20/2023 2   Final                  Men          Women1/2 Average Risk     3.4          3.3Average Risk          5.0          4.42X Average Risk          9.6          7.13X Average Risk          15.0          11.0                       NonHDL 04/20/2023 68.82   Final   NOTE:  Non-HDL goal should be 30 mg/dL higher than patient's LDL goal (i.e. LDL goal of < 70 mg/dL, would have non-HDL goal of < 100 mg/dL)   Hgb K4M MFr Bld 10/22/7251 5.2  4.6 - 6.5 % Final   Glycemic Control  Guidelines for People with Diabetes:Non Diabetic:  <6%Goal of Therapy: <7%Additional Action Suggested:  >8%    Microalb, Ur 04/20/2023 <0.7  0.0 - 1.9 mg/dL Final   Creatinine,U 66/44/0347 90.1  mg/dL Final   Microalb Creat Ratio 04/20/2023 0.8  0.0 - 30.0 mg/g Final    RADIOGRAPHIC STUDIES: I have personally reviewed the radiological images as listed and agree with the findings in the report  No results found.  ASSESSMENT/PLAN  Eosinophils:  Bone marrow-derived leukocytes whose development and terminal differentiation are under the control of IL-3, GM-CSF, and IL-5 , with IL-5 being the cytokine that is primarily responsible for eosinophilopoiesis.    Eosinophilia:  Defined as > 450 eosinophils/ul as measured by sampling peripheral blood, although eosinophils are predominantly found in peripheral tissues Hypereosinophilia is defined as eosinophil count of at least 1.5 x 10 9th for 6 months.    The most likely explanation in this patient is asthma.    May 01 2023- Eosinophilia has resolved with use of inhaled agents September Patient to undergo CT lung screening, mammogram in the near future so will hold off on CT CAP.  Can consider urine cytology to screen for bladder cancer  Somatic mutations in hematopoietic lineages:  The Jak2 panel demonstrated two variants of potential significance  Mutation in TET2 [id number]):  TET2 is commonly mutated in a range of myeloid disorders including myelodysplastic syndromes (MDS), myeloproliferative neoplasms (MPN), acute myeloid leukemia (AML), and chronic myelomonocytic leukemia (CMML) TET2 mutations are seen in approximately 20-25% of MDS cases. Of note, TET2 mutations may also be seen in the setting of age related  clonal hematopoiesis of indeterminate potential (CHIP) . One study found that TET2 mutations in MPN are associated with decreased overall survival and increased risk of leukemic transformation. In cases of confirmed myeloid neoplasms,  including MDS, MPN, and AML, the prognostic significance of TET2 mutations remains under investigation.   variant in CUX1 ([id number]):  This variant has not been reported as a somatic variants in tumors (COSMIC). It has been observed as an extremely rare population variant in publicly available databases (gnomAD). Therefore, due to the paucity of functional and clinical evidence, its significance is currently unclear. It exhibits an allele frequency close to 50% raising the possibility of germline origin, and has not been documented in the ClinVar germline public database.  Probable Clonal Hematopoeisis of Indeterminate Potential (CHIP):  A condition of hematological malignancy-associated somatic mutation(s) in the absence of other hematological malignancy diagnostic criteria. It is due to accumulation of mutations over time.  It is especially common in the elderly (>10% in those > 65).  Also called age-related clonal hematopoiesis [facility]). The minimum variant allele frequency (VAF) for genetic variants in individuals to meet the criteria for CHIP is ?2% .  These are commonly driver mutations in TET2 and DNMT3A genes.    It is associated with an increased risk of progression to a hematological malignancy (0.5% to 1% per year)  This offers a reasonable explanation as to the findings of the the above mentioned findings in the Jak 2 panel in the setting of a reactive eosinophilia  History of tobacco use  July 17 2022- CT lung screening Lung-RADS 21 June 2023- for follow up CT screen  Personal history of colon polyps and family history of malignancy:  Last meeting with genetic counselor was 12 yrs ago.  Consider referring back.    May 01 2023- Referring back to Genetics    Cancer Staging  No matching staging information was found for the patient.    No problem-specific Assessment & Plan notes found for this encounter.   Orders Placed This Encounter  Procedures   Ambulatory referral  to Genetics    Referral Priority:   Routine    Referral Type:   Consultation    Referral Reason:   Specialty Services Required    Number of Visits Requested:   1   30  minutes was spent in patient care.  This included time spent preparing to see the patient (e.g., review of tests), obtaining and/or reviewing separately obtained history, counseling and educating the patient, ordering tests; documenting clinical information in the electronic or other health record, independently interpreting results and communicating results to the patient as well as coordination of care.      All questions were answered. The patient knows to call the clinic with any problems, questions or concerns.  This note was electronically signed.    Loni Muse, MD  05/28/2023 1:23 PM

## 2023-05-01 ENCOUNTER — Other Ambulatory Visit: Payer: Self-pay

## 2023-05-01 ENCOUNTER — Inpatient Hospital Stay: Payer: Medicare HMO

## 2023-05-01 ENCOUNTER — Encounter: Payer: Self-pay | Admitting: Oncology

## 2023-05-01 ENCOUNTER — Inpatient Hospital Stay: Payer: Medicare HMO | Attending: Oncology | Admitting: Oncology

## 2023-05-01 VITALS — BP 169/83 | HR 96 | Temp 99.5°F | Resp 17 | Wt 191.4 lb

## 2023-05-01 DIAGNOSIS — R0981 Nasal congestion: Secondary | ICD-10-CM | POA: Diagnosis not present

## 2023-05-01 DIAGNOSIS — Z8052 Family history of malignant neoplasm of bladder: Secondary | ICD-10-CM | POA: Insufficient documentation

## 2023-05-01 DIAGNOSIS — Z1231 Encounter for screening mammogram for malignant neoplasm of breast: Secondary | ICD-10-CM | POA: Diagnosis not present

## 2023-05-01 DIAGNOSIS — Z86010 Personal history of colonic polyps: Secondary | ICD-10-CM | POA: Diagnosis not present

## 2023-05-01 DIAGNOSIS — Z880 Allergy status to penicillin: Secondary | ICD-10-CM | POA: Insufficient documentation

## 2023-05-01 DIAGNOSIS — R232 Flushing: Secondary | ICD-10-CM | POA: Insufficient documentation

## 2023-05-01 DIAGNOSIS — J411 Mucopurulent chronic bronchitis: Secondary | ICD-10-CM | POA: Diagnosis not present

## 2023-05-01 DIAGNOSIS — Z833 Family history of diabetes mellitus: Secondary | ICD-10-CM | POA: Diagnosis not present

## 2023-05-01 DIAGNOSIS — J4489 Other specified chronic obstructive pulmonary disease: Secondary | ICD-10-CM | POA: Insufficient documentation

## 2023-05-01 DIAGNOSIS — Z83719 Family history of colon polyps, unspecified: Secondary | ICD-10-CM | POA: Insufficient documentation

## 2023-05-01 DIAGNOSIS — D721 Eosinophilia, unspecified: Secondary | ICD-10-CM | POA: Insufficient documentation

## 2023-05-01 DIAGNOSIS — Z87891 Personal history of nicotine dependence: Secondary | ICD-10-CM | POA: Insufficient documentation

## 2023-05-01 DIAGNOSIS — G4733 Obstructive sleep apnea (adult) (pediatric): Secondary | ICD-10-CM | POA: Diagnosis not present

## 2023-05-01 DIAGNOSIS — M25562 Pain in left knee: Secondary | ICD-10-CM

## 2023-05-01 DIAGNOSIS — I10 Essential (primary) hypertension: Secondary | ICD-10-CM | POA: Insufficient documentation

## 2023-05-01 DIAGNOSIS — Z818 Family history of other mental and behavioral disorders: Secondary | ICD-10-CM | POA: Diagnosis not present

## 2023-05-01 DIAGNOSIS — R0789 Other chest pain: Secondary | ICD-10-CM | POA: Insufficient documentation

## 2023-05-01 DIAGNOSIS — Z9049 Acquired absence of other specified parts of digestive tract: Secondary | ICD-10-CM | POA: Insufficient documentation

## 2023-05-01 DIAGNOSIS — R499 Unspecified voice and resonance disorder: Secondary | ICD-10-CM | POA: Diagnosis not present

## 2023-05-01 DIAGNOSIS — Z8349 Family history of other endocrine, nutritional and metabolic diseases: Secondary | ICD-10-CM | POA: Insufficient documentation

## 2023-05-01 DIAGNOSIS — Z8719 Personal history of other diseases of the digestive system: Secondary | ICD-10-CM | POA: Diagnosis not present

## 2023-05-01 DIAGNOSIS — R0989 Other specified symptoms and signs involving the circulatory and respiratory systems: Secondary | ICD-10-CM | POA: Diagnosis not present

## 2023-05-01 DIAGNOSIS — M25561 Pain in right knee: Secondary | ICD-10-CM

## 2023-05-01 DIAGNOSIS — Z803 Family history of malignant neoplasm of breast: Secondary | ICD-10-CM | POA: Insufficient documentation

## 2023-05-01 DIAGNOSIS — Z800 Family history of malignant neoplasm of digestive organs: Secondary | ICD-10-CM | POA: Insufficient documentation

## 2023-05-01 DIAGNOSIS — D7589 Other specified diseases of blood and blood-forming organs: Secondary | ICD-10-CM | POA: Diagnosis not present

## 2023-05-01 DIAGNOSIS — Z8049 Family history of malignant neoplasm of other genital organs: Secondary | ICD-10-CM | POA: Insufficient documentation

## 2023-05-01 DIAGNOSIS — Z79899 Other long term (current) drug therapy: Secondary | ICD-10-CM | POA: Insufficient documentation

## 2023-05-01 DIAGNOSIS — Z808 Family history of malignant neoplasm of other organs or systems: Secondary | ICD-10-CM | POA: Diagnosis not present

## 2023-05-01 DIAGNOSIS — Z8249 Family history of ischemic heart disease and other diseases of the circulatory system: Secondary | ICD-10-CM | POA: Insufficient documentation

## 2023-05-04 ENCOUNTER — Telehealth: Payer: Self-pay | Admitting: Oncology

## 2023-05-04 ENCOUNTER — Telehealth: Payer: Self-pay | Admitting: Genetic Counselor

## 2023-05-04 ENCOUNTER — Ambulatory Visit (INDEPENDENT_AMBULATORY_CARE_PROVIDER_SITE_OTHER): Payer: Medicare HMO | Admitting: Internal Medicine

## 2023-05-04 ENCOUNTER — Encounter: Payer: Self-pay | Admitting: Gastroenterology

## 2023-05-04 VITALS — BP 134/82 | HR 95 | Temp 98.3°F | Ht 63.0 in | Wt 190.6 lb

## 2023-05-04 DIAGNOSIS — E785 Hyperlipidemia, unspecified: Secondary | ICD-10-CM | POA: Diagnosis not present

## 2023-05-04 DIAGNOSIS — R7302 Impaired glucose tolerance (oral): Secondary | ICD-10-CM | POA: Diagnosis not present

## 2023-05-04 DIAGNOSIS — Z01811 Encounter for preprocedural respiratory examination: Secondary | ICD-10-CM

## 2023-05-04 DIAGNOSIS — Z860100 Personal history of colon polyps, unspecified: Secondary | ICD-10-CM

## 2023-05-04 DIAGNOSIS — I10 Essential (primary) hypertension: Secondary | ICD-10-CM

## 2023-05-04 DIAGNOSIS — J310 Chronic rhinitis: Secondary | ICD-10-CM | POA: Diagnosis not present

## 2023-05-04 DIAGNOSIS — H538 Other visual disturbances: Secondary | ICD-10-CM | POA: Diagnosis not present

## 2023-05-04 DIAGNOSIS — E538 Deficiency of other specified B group vitamins: Secondary | ICD-10-CM

## 2023-05-04 DIAGNOSIS — Z0001 Encounter for general adult medical examination with abnormal findings: Secondary | ICD-10-CM

## 2023-05-04 DIAGNOSIS — D721 Eosinophilia, unspecified: Secondary | ICD-10-CM

## 2023-05-04 DIAGNOSIS — Z0000 Encounter for general adult medical examination without abnormal findings: Secondary | ICD-10-CM | POA: Diagnosis not present

## 2023-05-04 DIAGNOSIS — R931 Abnormal findings on diagnostic imaging of heart and coronary circulation: Secondary | ICD-10-CM

## 2023-05-04 DIAGNOSIS — J449 Chronic obstructive pulmonary disease, unspecified: Secondary | ICD-10-CM

## 2023-05-04 DIAGNOSIS — Z01810 Encounter for preprocedural cardiovascular examination: Secondary | ICD-10-CM

## 2023-05-04 DIAGNOSIS — E559 Vitamin D deficiency, unspecified: Secondary | ICD-10-CM

## 2023-05-04 NOTE — Progress Notes (Unsigned)
Patient ID: Crystal [name], female   DOB: 11-02-1950, 72 y.o.   MRN: [id number]         Chief Complaint:: wellness exam and low b12 and vit d, htn, hld, hyperglycemia, copd        HPI:  EILIANA [name] is a 72 y.o. female here for wellness exam. Due for colonoscopy aug 2024, for tdap and shingrix at pharmacy, declines covid booster, o/w up to date                        Also duaghter from [place] now here for visit.  Pt denies chest pain, increased sob or doe, wheezing, orthopnea, PND, increased LE swelling, palpitations, dizziness or syncope.   Pt denies polydipsia, polyuria, or new focal neuro s/s.    Pt denies fever, wt loss, night sweats, loss of appetite, or other constitutional symptoms  Wt down 10 lbs with better diet    Wt Readings from Last 3 Encounters:  05/04/23 190 lb 9.6 oz (86.5 kg)  05/01/23 191 lb 6.4 oz (86.8 kg)  04/13/23 200 lb (90.7 kg)   BP Readings from Last 3 Encounters:  05/04/23 134/82  05/01/23 (!) 169/83  02/18/23 119/69   Immunization History  Administered Date(s) Administered   Hepatitis B, ADULT 01/23/2017, 02/23/2017   Hepatitis B, PED/ADOLESCENT 07/27/2017   PFIZER Comirnaty(Gray Top)Covid-19 Tri-Sucrose Vaccine 02/10/2021   PFIZER(Purple Top)SARS-COV-2 Vaccination 12/13/2019, 12/27/2019, 08/25/2020   Pneumococcal Conjugate-13 03/05/2016   Pneumococcal Polysaccharide-23 03/11/2017   Td 12/21/2006   Tdap 02/17/2013   Health Maintenance Due  Topic Date Due   Zoster Vaccines- Shingrix (1 of 2) Never done   COVID-19 Vaccine (5 - 2023-24 season) 06/20/2022   DTaP/Tdap/Td (3 - Td or Tdap) 02/18/2023   Colonoscopy  06/08/2023      Past Medical History:  Diagnosis Date   ALLERGIC RHINITIS    Allergy    ANXIETY    no medations needed   Arthritis    Asthma    Bronchitis    Cataract    no surgery   COLONIC POLYPS, HX OF    Complication of anesthesia    oxygen sat dropped during endoscopy    COPD    pt unsure of this dx   Cough     DEPRESSION    Dyspnea    Elevated coronary artery calcium score 12/2021   Agatston score 173-all LAD   Fatty liver    GERD    GLUCOSE INTOLERANCE    HYPERTENSION    IBS    Impaired glucose tolerance    Obstructive sleep apnea 06/25/2018   OSTEOPENIA    Osteopenia    Osteoporosis 07/30/2017   PERIPHERAL EDEMA    Pneumonia    Pneumonia, organism unspecified(486)    Sleep apnea    Oral Aparatus   TOBACCO USE DISORDER/SMOKER-SMOKING CESSATION DISCUSSED    Past Surgical History:  Procedure Laterality Date   birthmark removed     from back as a child   bladder mesh     2012   CESAREAN SECTION     x2   CHOLECYSTECTOMY     COLONOSCOPY  2018   TA   MOUTH SURGERY  04/25/2020   tissue graft -room of mouth to front gums   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   Procedure: LEFT TOTAL HIP ARTHROPLASTY ANTERIOR APPROACH;  Surgeon: Marcene Corning, MD;  Location: [facility];  Service: Orthopedics;  Laterality: Left;   TOTAL KNEE ARTHROPLASTY  Left 02/17/2023   Procedure: LEFT TOTAL KNEE ARTHROPLASTY;  Surgeon: Marcene Corning, MD;  Location: [facility];  Service: Orthopedics;  Laterality: Left;   WISDOM TOOTH EXTRACTION      reports that she quit smoking about 6 months ago. Her smoking use included cigarettes. She started smoking about 45 years ago. She has a 45 pack-year smoking history. She has never used smokeless tobacco. She reports current alcohol use of about 21.0 standard drinks of alcohol per week. She reports that she does not use drugs. family history includes Allergic rhinitis in her sister; Anxiety disorder in an other family member; Bladder Cancer in her father; Breast cancer in her sister; Breast cancer (age of onset: 60) in her sister; Cervical cancer in her sister; Colon cancer in her father and paternal grandfather; Colon polyps in her father; Diabetes in an other family member; Heart attack (age of onset: 19) in her maternal grandmother; Heart attack (age of onset: 41) in her  maternal uncle; Hyperlipidemia in an other family member; Hypothyroidism in an other family member; Melanoma in her paternal grandfather; Other in her mother; Skin cancer in her brother, father, and mother; Thyroid cancer (age of onset: 16) in her paternal grandmother. Allergies  Allergen Reactions   Tape     Removed a layer of skin when removed, please use paper tape   Ace Inhibitors Cough   Penicillins Rash   Current Outpatient Medications on File Prior to Visit  Medication Sig Dispense Refill   albuterol (VENTOLIN HFA) 108 (90 Base) MCG/ACT inhaler Inhale 2 puffs into the lungs every 4 (four) hours as needed for wheezing or shortness of breath (coughing fits). 18 g 1   aspirin EC 81 MG tablet Take 1 tablet (81 mg total) by mouth 2 (two) times daily. For 2 weeks then once a day for 2 weeks after that. 45 tablet 0   atorvastatin (LIPITOR) 40 MG tablet TAKE 1 TABLET BY MOUTH EVERY DAY 90 tablet 3   Budeson-Glycopyrrol-Formoterol (BREZTRI AEROSPHERE) 160-9-4.8 MCG/ACT AERO Inhale 2 puffs into the lungs in the morning and at bedtime. with spacer and rinse mouth afterwards. 10.7 g 5   Calcium Carb-Cholecalciferol 600-800 MG-UNIT TABS Take 1 tablet by mouth in the morning and at bedtime.      Cholecalciferol (VITAMIN D PO) Take 2,000 Units by mouth daily.      CRANBERRY PO Take 1 tablet by mouth 3 (three) times a week.     denosumab (PROLIA) 60 MG/ML SOSY injection Inject 60 mg into the skin every 6 (six) months. 180 mL 0   folic acid (FOLVITE) 400 MCG tablet Take 400 mcg by mouth daily.     HYDROcodone-acetaminophen (NORCO/VICODIN) 5-325 MG tablet Take 1-2 tablets by mouth every 6 (six) hours as needed for moderate pain or severe pain (post op pain). 40 tablet 0   loratadine (CLARITIN) 10 MG tablet Take 10 mg by mouth daily.     losartan (COZAAR) 50 MG tablet TAKE 1 TABLET EVERY DAY (ANNUAL APPOINTMENT DUE IN MAY, MUST SEE MD FOR FUTURE REFILLS) 90 tablet 3   metoprolol succinate (TOPROL-XL) 25 MG  24 hr tablet Take 1 tablet (25 mg total) by mouth daily. Annual appt is due w/labs must see provider for future refills 30 tablet 0   NON FORMULARY Pt uses a cpap nightly     tiZANidine (ZANAFLEX) 4 MG tablet Take 1 tablet (4 mg total) by mouth every 6 (six) hours as needed for muscle spasms. 40 tablet 1  tolterodine (DETROL LA) 4 MG 24 hr capsule Take 4 mg by mouth daily.     No current facility-administered medications on file prior to visit.        ROS:  All others reviewed and negative.  Objective        PE:  BP 134/82 (BP Location: Left Arm, Patient Position: Sitting, Cuff Size: Large)   Pulse 95   Temp 98.3 F (36.8 C) (Oral)   Ht 5\' 3"  (1.6 m)   Wt 190 lb 9.6 oz (86.5 kg)   SpO2 96%   BMI 33.76 kg/m                 Constitutional: Pt appears in NAD               HENT: Head: NCAT.                Right Ear: External ear normal.                 Left Ear: External ear normal.                Eyes: . Pupils are equal, round, and reactive to light. Conjunctivae and EOM are normal               Nose: without d/c or deformity               Neck: Neck supple. Gross normal ROM               Cardiovascular: Normal rate and regular rhythm.                 Pulmonary/Chest: Effort normal and breath sounds decreased without rales or wheezing.                Abd:  Soft, NT, ND, + BS, no organomegaly               Neurological: Pt is alert. At baseline orientation, motor grossly intact               Skin: Skin is warm. No rashes, no other new lesions, LE edema - none               Psychiatric: Pt behavior is normal without agitation   Micro: none  Cardiac tracings I have personally interpreted today:  none  Pertinent Radiological findings (summarize): none   Lab Results  Component Value Date   WBC 6.9 04/20/2023   HGB 13.3 04/20/2023   HCT 40.3 04/20/2023   PLT 341.0 04/20/2023   GLUCOSE 90 04/20/2023   CHOL 144 04/20/2023   TRIG 44.0 04/20/2023   HDL 75.50 04/20/2023    LDLDIRECT 82.2 07/01/2013   LDLCALC 60 04/20/2023   ALT 13 04/20/2023   AST 14 04/20/2023   NA 139 04/20/2023   K 4.6 04/20/2023   CL 102 04/20/2023   CREATININE 0.68 04/20/2023   BUN 12 04/20/2023   CO2 28 04/20/2023   TSH 1.06 04/20/2023   INR 1.0 08/31/2020   HGBA1C 5.2 04/20/2023   MICROALBUR <0.7 04/20/2023   Assessment/Plan:  KAYLEEN ALIG is a 72 y.o. White or Caucasian [1] female with  has a past medical history of ALLERGIC RHINITIS, Allergy, ANXIETY, Arthritis, Asthma, Bronchitis, Cataract, COLONIC POLYPS, HX OF, Complication of anesthesia, COPD, Cough, DEPRESSION, Dyspnea, Elevated coronary artery calcium score (12/2021), Fatty liver, GERD, GLUCOSE INTOLERANCE, HYPERTENSION, IBS, Impaired glucose tolerance, Obstructive sleep apnea (06/25/2018), OSTEOPENIA, Osteopenia, Osteoporosis (07/30/2017), PERIPHERAL  EDEMA, Pneumonia, Pneumonia, organism unspecified(486), Sleep apnea, and TOBACCO USE DISORDER/SMOKER-SMOKING CESSATION DISCUSSED.  Encounter for well adult exam with abnormal findings Age and sex appropriate education and counseling updated with regular exercise and diet Referrals for preventative services - for colonoscopy soon Immunizations addressed - for tdap and shingrix at pharmacy, declines covid booster Smoking counseling  - none needed Evidence for depression or other mood disorder - none significant Most recent labs reviewed. I have personally reviewed and have noted: 1) the patient's medical and social history 2) The patient's current medications and supplements 3) The patient's height, weight, and BMI have been recorded in the chart   COPD mixed type (HCC) Stable overall, cont inhaler asd, for pulm referral to f/u per pt request  B12 deficiency Lab Results  Component Value Date   VITAMINB12 581 04/20/2023   Stable, cont oral replacement - b12 1000 mcg qd   Essential hypertension BP Readings from Last 3 Encounters:  05/04/23 134/82  05/01/23 (!)  169/83  02/18/23 119/69   Stable, pt to continue medical treatment losartan 50 every day, toprol xl 25 qd    Hyperlipidemia with target LDL less than 70 Lab Results  Component Value Date   LDLCALC 60 04/20/2023   Stable, pt to continue current statin lipitor 40 qd   Impaired glucose tolerance Lab Results  Component Value Date   HGBA1C 5.2 04/20/2023   Stable, pt to continue current medical treatment  - diet,wt control   Vitamin D deficiency Last vitamin D Lab Results  Component Value Date   VD25OH 50.92 04/20/2023   Stable, cont oral replacement   Eosinophilia For heme referral to f/u per pt reqeust  Rhinitis Also for allergy referral  History of colon polyps For GI referral  Elevated coronary artery calcium score-173 For cardiology referral to f/u  Blurred vision Now for referral optho per pt reqeust  Followup: Return in about 1 year (around 05/03/2024).  Oliver Barre, MD 05/06/2023 8:24 PM [facility]

## 2023-05-04 NOTE — Patient Instructions (Addendum)
Please have your Shingrix (shingles) shots done at your local pharmacy, and the Tdap tetanus shot  Please continue all other medications as before, and refills have been done if requested.  Please have the pharmacy call with any other refills you may need.  Please continue your efforts at being more active, low cholesterol diet, and weight control.  You are otherwise up to date with prevention measures today.  Please keep your appointments with your specialists as you may have planned  You will be contacted regarding the referral for: GI, allergy, cardiology, pulmonary, and Eye doctor  Your lab work was good  Please make an Appointment to return for your 1 year visit, or sooner if needed, with Lab testing by Appointment as well, to be done about 3-5 days before at the FIRST FLOOR Lab (so this is for TWO appointments - please see the scheduling desk as you leave)

## 2023-05-04 NOTE — Telephone Encounter (Signed)
Unable to leave a message regarding upcoming follow up

## 2023-05-04 NOTE — Telephone Encounter (Signed)
Left patient a message regarding upcoming appointment times/dates with the Genetic Counselor

## 2023-05-06 ENCOUNTER — Encounter: Payer: Self-pay | Admitting: Internal Medicine

## 2023-05-06 DIAGNOSIS — H538 Other visual disturbances: Secondary | ICD-10-CM | POA: Insufficient documentation

## 2023-05-06 DIAGNOSIS — Z86010 Personal history of colonic polyps: Secondary | ICD-10-CM | POA: Insufficient documentation

## 2023-05-06 DIAGNOSIS — J310 Chronic rhinitis: Secondary | ICD-10-CM | POA: Insufficient documentation

## 2023-05-06 NOTE — Assessment & Plan Note (Signed)
Last vitamin D Lab Results  Component Value Date   VD25OH 50.92 04/20/2023   Stable, cont oral replacement

## 2023-05-06 NOTE — Assessment & Plan Note (Signed)
For GI referral 

## 2023-05-06 NOTE — Assessment & Plan Note (Signed)
Lab Results  Component Value Date   LDLCALC 60 04/20/2023   Stable, pt to continue current statin lipitor 40 qd

## 2023-05-06 NOTE — Assessment & Plan Note (Addendum)
Stable overall, cont inhaler asd, for pulm referral to f/u per pt request

## 2023-05-06 NOTE — Assessment & Plan Note (Signed)
For cardiology referral to f/u

## 2023-05-06 NOTE — Assessment & Plan Note (Signed)
Lab Results  Component Value Date   VITAMINB12 581 04/20/2023   Stable, cont oral replacement - b12 1000 mcg qd

## 2023-05-06 NOTE — Assessment & Plan Note (Signed)
Age and sex appropriate education and counseling updated with regular exercise and diet Referrals for preventative services - for colonoscopy soon Immunizations addressed - for tdap and shingrix at pharmacy, declines covid booster Smoking counseling  - none needed Evidence for depression or other mood disorder - none significant Most recent labs reviewed. I have personally reviewed and have noted: 1) the patient's medical and social history 2) The patient's current medications and supplements 3) The patient's height, weight, and BMI have been recorded in the chart

## 2023-05-06 NOTE — Assessment & Plan Note (Signed)
For heme referral to f/u per pt reqeust

## 2023-05-06 NOTE — Assessment & Plan Note (Signed)
Lab Results  Component Value Date   HGBA1C 5.2 04/20/2023   Stable, pt to continue current medical treatment  - diet,wt control

## 2023-05-06 NOTE — Assessment & Plan Note (Signed)
Also for allergy referral

## 2023-05-06 NOTE — Assessment & Plan Note (Signed)
Now for referral optho per pt reqeust

## 2023-05-06 NOTE — Assessment & Plan Note (Signed)
BP Readings from Last 3 Encounters:  05/04/23 134/82  05/01/23 (!) 169/83  02/18/23 119/69   Stable, pt to continue medical treatment losartan 50 every day, toprol xl 25 qd

## 2023-05-13 ENCOUNTER — Other Ambulatory Visit: Payer: Self-pay

## 2023-05-13 ENCOUNTER — Other Ambulatory Visit: Payer: Self-pay | Admitting: Internal Medicine

## 2023-05-18 DIAGNOSIS — M1712 Unilateral primary osteoarthritis, left knee: Secondary | ICD-10-CM | POA: Diagnosis not present

## 2023-05-28 ENCOUNTER — Ambulatory Visit: Payer: Medicare HMO | Admitting: Student

## 2023-06-02 ENCOUNTER — Inpatient Hospital Stay: Payer: Medicare HMO

## 2023-06-02 ENCOUNTER — Encounter: Payer: Self-pay | Admitting: Genetic Counselor

## 2023-06-02 ENCOUNTER — Other Ambulatory Visit: Payer: Self-pay | Admitting: Genetic Counselor

## 2023-06-02 ENCOUNTER — Inpatient Hospital Stay: Payer: Medicare HMO | Attending: Oncology | Admitting: Genetic Counselor

## 2023-06-02 ENCOUNTER — Other Ambulatory Visit: Payer: Self-pay

## 2023-06-02 DIAGNOSIS — D126 Benign neoplasm of colon, unspecified: Secondary | ICD-10-CM | POA: Diagnosis not present

## 2023-06-02 DIAGNOSIS — Z86010 Personal history of colonic polyps: Secondary | ICD-10-CM | POA: Diagnosis not present

## 2023-06-02 DIAGNOSIS — Z800 Family history of malignant neoplasm of digestive organs: Secondary | ICD-10-CM

## 2023-06-02 DIAGNOSIS — Z1379 Encounter for other screening for genetic and chromosomal anomalies: Secondary | ICD-10-CM

## 2023-06-02 DIAGNOSIS — Z803 Family history of malignant neoplasm of breast: Secondary | ICD-10-CM

## 2023-06-02 LAB — GENETIC SCREENING ORDER

## 2023-06-02 NOTE — Progress Notes (Signed)
REFERRING PROVIDER: Leatha Gilding, MD  PRIMARY PROVIDER:  Corwin Levins, MD  PRIMARY REASON FOR VISIT:  1. Colon adenomas   2. Family history of breast cancer in sister   3. Family history of colon cancer     HISTORY OF PRESENT ILLNESS:   Ms. Haase, a 72 y.o. female, was seen for a [facility] cancer genetics consultation at the request of Dr. Angelene Giovanni due to a personal history of colon polyps and family history of cancer.  Ms. Craige presents to clinic today to discuss the possibility of a hereditary predisposition to cancer, to discuss genetic testing, and to further clarify her future cancer risks, as well as potential cancer risks for family members.   Ms. Hughley is a 72 y.o. female with no personal history of cancer. 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 Ms. Secrease had negative genetic testing through [facility] for 32 genes in 2017.   RISK FACTORS:  Menarche was at age 55.  First live birth at age 48.  OCP use for approximately 5-6 years.  Ovaries intact: yes.  Uterus intact: yes.  Menopausal status: postmenopausal, menopause at age 61  HRT use: 0 years. Mammogram within the last year: yes. Number of breast biopsies: 0. Any excessive radiation exposure in the past: no  Colonoscopies: 2021 colonoscopy identified 3 tubular adenomas, 2018 colonoscopy identified 5 tubular adenomas, 2017 colonoscopy identified 12 polyps and of the tissue fragments obtained, greater than 85% was adenomatous.  At least 3 adenomatous polyps were found on colonoscopies prior to 2017.   Past Medical History:  Diagnosis Date   ALLERGIC RHINITIS    Allergy    ANXIETY    no medations needed   Arthritis    Asthma    Bronchitis    Cataract    no surgery   COLONIC POLYPS, HX OF    Complication of anesthesia    oxygen sat dropped during endoscopy    COPD    pt unsure of this dx   Cough    DEPRESSION    Dyspnea    Elevated coronary artery calcium score 12/2021   Agatston score 173-all  LAD   Fatty liver    GERD    GLUCOSE INTOLERANCE    HYPERTENSION    IBS    Impaired glucose tolerance    Obstructive sleep apnea 06/25/2018   OSTEOPENIA    Osteopenia    Osteoporosis 07/30/2017   PERIPHERAL EDEMA    Pneumonia    Pneumonia, organism unspecified(486)    Sleep apnea    Oral Aparatus   TOBACCO USE DISORDER/SMOKER-SMOKING CESSATION DISCUSSED     Past Surgical History:  Procedure Laterality Date   birthmark removed     from back as a child   bladder mesh     2012   CESAREAN SECTION     x2   CHOLECYSTECTOMY     COLONOSCOPY  2018   TA   MOUTH SURGERY  04/25/2020   tissue graft -room of mouth to front gums   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   Procedure: LEFT TOTAL HIP ARTHROPLASTY ANTERIOR APPROACH;  Surgeon: Marcene Corning, MD;  Location: [facility];  Service: Orthopedics;  Laterality: Left;   TOTAL KNEE ARTHROPLASTY Left 02/17/2023   Procedure: LEFT TOTAL KNEE ARTHROPLASTY;  Surgeon: Marcene Corning, MD;  Location: [facility];  Service: Orthopedics;  Laterality: Left;   WISDOM TOOTH EXTRACTION      Social History   Socioeconomic History   Marital status: Widowed  Spouse name: Not on file   Number of children: 2   Years of education: Not on file   Highest education level: Not on file  Occupational History   Occupation: Data processing manager industries, signage for [employer]    Comment: Retired  Tobacco Use   Smoking status: Former    Current packs/day: 0.00    Average packs/day: 1 pack/day for 45.0 years (45.0 ttl pk-yrs)    Types: Cigarettes    Start date: 10/20/1977    Quit date: 10/13/2022    Years since quitting: 0.6   Smokeless tobacco: Never  Vaping Use   Vaping status: Never Used  Substance and Sexual Activity   Alcohol use: Yes    Alcohol/week: 21.0 standard drinks of alcohol    Types: 7 Glasses of wine, 14 Standard drinks or equivalent per week    Comment: couple of drinks 1-2 nites per week    Drug use: No   Sexual activity:  Not on file  Other Topics Concern   Not on file  Social History Narrative   She indicates that she has been most of her life sedentary, but started exercising in the fall of October 2022: Has started doing water aerobics.      She had work up to 4 days a week, but has had to scale down to about 2 days a week because of knee pain.   Social Determinants of Health   Financial Resource Strain: Low Risk  (04/13/2023)   Overall Financial Resource Strain (CARDIA)    Difficulty of Paying Living Expenses: Not hard at all  Food Insecurity: No Food Insecurity (04/13/2023)   Hunger Vital Sign    Worried About Running Out of Food in the Last Year: Never true    Ran Out of Food in the Last Year: Never true  Transportation Needs: No Transportation Needs (04/13/2023)   PRAPARE - Administrator, Civil Service (Medical): No    Lack of Transportation (Non-Medical): No  Physical Activity: Sufficiently Active (04/13/2023)   Exercise Vital Sign    Days of Exercise per Week: 4 days    Minutes of Exercise per Session: 60 min  Stress: No Stress Concern Present (04/13/2023)   [employer] of Occupational Health - Occupational Stress Questionnaire    Feeling of Stress : Not at all  Social Connections: Socially Isolated (04/13/2023)   Social Connection and Isolation Panel [NHANES]    Frequency of Communication with Friends and Family: Three times a week    Frequency of Social Gatherings with Friends and Family: Three times a week    Attends Religious Services: Never    Active Member of Clubs or Organizations: No    Attends Banker Meetings: Never    Marital Status: Widowed     FAMILY HISTORY:  We obtained a detailed, 4-generation family history.  Significant diagnoses are noted below:     Ms. Ridder has 2 sisters, 1 was diagnosed with breast cancer at age 73 and 1 was diagnosed with cervical cancer in her 41s and breast cancer in her early 52s. Ms. Farar father was  diagnosed with bladder and colon cancer at age 85, they did not know if it was bladder cancer with metastasis to the colon or colon cancer with metastasis to the bladder, he had a history of colon polyps and died at age 64. Her paternal grandmother was diagnosed with thyroid cancer at age 24, she died at 67 and her paternal grandfather was diagnosed  with melanoma at an unknown age, he is deceased. Ms. Zamboni is unaware of previous family history of genetic testing for hereditary cancer risks. There is no reported Ashkenazi Jewish ancestry.   GENETIC COUNSELING ASSESSMENT: Ms. Qiao is a 72 y.o. female with a personal history of colon polyps and family history of cancer. We, therefore, discussed and recommended the following at today's visit.   DISCUSSION: We discussed that 5 - 10% of cancer is hereditary, with most cases of colon polyps associated with APC/MUTYH.  There are other genes that can be associated with hereditary polyposis syndromes.  We discussed that testing is beneficial for several reasons, including knowing about other cancer risks, identifying potential screening and risk-reduction options that may be appropriate, and to understanding if other family members could be at risk for cancer and allowing them to undergo genetic testing.  We reviewed the characteristics, features and inheritance patterns of hereditary cancer syndromes. We also discussed genetic testing, including the appropriate family members to test, the process of testing, insurance coverage and turn-around-time for results. We discussed the implications of a negative, positive, carrier and/or variant of uncertain significant result. We recommended Ms. Seres pursue genetic testing for a panel that includes genes associated with polyposis, breast cancer, bladder cancer, colon cancer, thyroid cancer, and melanoma.   We reviewed her negative genetic testing results from 2017. We reviewed that polyposis genes such as NTHL1 and  MBD4 were not included on the [facility] panel she had performed and we can now order RNA analysis. Therefore, updated testing is reasonable to consider.   Ms. Torell was offered a common hereditary cancer panel (49 genes) and an expanded pan-cancer panel (70 genes). Ms. Crittenden was informed of the benefits and limitations of each panel, including that expanded pan-cancer panels contain genes that do not have clear management guidelines at this point in time.  We also discussed that as the number of genes included on a panel increases, the chances of variants of uncertain significance increases. After considering the benefits and limitations of each gene panel, Ms. Lattin elected to have [facility] 49 gene panel.  The Custom Hereditary Cancers Panel offered by [facility] includes sequencing and/or deletion duplication testing of the following 49 genes: APC, ATM, AXIN2, BAP1, BARD1, BMPR1A, BRCA1, BRCA2, BRIP1, CDH1, CDK4, CDKN2A (p14ARF and p16INK4a only), CHEK2, CTNNA1, DICER1, EPCAM (Deletion/duplication testing only), FH, GREM1 (promoter region duplication testing only), HOXB13, KIT, MBD4, MEN1, MITF, MLH1, MSH2, MSH3, MSH6, MUTYH, NF1, NHTL1, PALB2, PDGFRA, PMS2, POLD1, POLE, POT1, PRKAR1A, PTEN, RAD51C, RAD51D, RB1, RET, SMAD4, SMARCA4. STK11, TP53, TSC1, TSC2, and VHL.  Based on Ms. Coupe's personal history of >10 tubular adenomas and family history of cancer, she meets medical criteria for genetic testing. Despite that she meets criteria, she may still have an out of pocket cost. We discussed that if her out of pocket cost for testing is over $100, the laboratory will call and confirm whether she wants to proceed with testing.  If the out of pocket cost of testing is less than $100 she will be billed by the genetic testing laboratory.   PLAN: After considering the risks, benefits, and limitations, Ms. Yarn provided informed consent to pursue genetic testing and the blood sample was sent to [facility] for analysis of the Custom Panel. Results should be available within approximately 2-3 weeks' time, at which point they will be disclosed by telephone to Ms. Moustafa, as will any additional recommendations warranted by these results. Ms. Lomelino will receive a summary  of her genetic counseling visit and a copy of her results once available. This information will also be available in Epic.   Ms. Rodberg questions were answered to her satisfaction today. Our contact information was provided should additional questions or concerns arise. Thank you for the referral and allowing [place] to share in the care of your patient.   Lalla Brothers, MS, [facility] Genetic Counselor [email or web address] (P) [phone]  The patient was seen for a total of 40 minutes in face-to-face genetic counseling.  The patient was seen alone.  Drs. Pamelia Hoit and/or Mosetta Putt were available to discuss this case as needed.   _______________________________________________________________________ For Office Staff:  Number of people involved in session: 1 Was an Intern/ student involved with case: no

## 2023-06-07 NOTE — Progress Notes (Unsigned)
Follow Up Note  RE: Crystal [name] MRN: [id number] DOB: 10-31-1950 Date of Office Visit: 06/08/2023  Referring provider: Corwin Levins, MD Primary care provider: Corwin Levins, MD  Chief Complaint: No chief complaint on file.  History of Present Illness: I had the pleasure of seeing Crystal [name] for a follow up visit at the [facility] on 06/07/2023. She is a 72 y.o. female, who is being followed for COPD with asthma, allergic rhinitis. Her previous allergy office visit was on 02/02/2023 with Dr. Selena [name]. Today is a regular follow up visit.  COPD with asthma [facility]) Past history - Was doing well until June and required oral prednisone and IM steroid. Breathing is not back to baseline. Quit smoking.  2023 spirometry showed 10% improvement in FEV1 post bronchodilator treatment. Clinically feeling improved. 2023 CT chest done. Interim history - well controlled and uses Breztri 2 puffs once a day but twice a day when she goes swimming. Normal spirometry today. Daily controller medication(s):  Continue Breztri 2 puffs 1-2 times a day with spacer and rinse mouth afterwards. During respiratory infections/flares:  Increase Breztri to 2 puffs twice a day for 1-2 weeks until your breathing symptoms return to baseline.  Pretreat with albuterol 2 puffs. May use albuterol rescue inhaler 2 puffs or nebulizer every 4 to 6 hours as needed for shortness of breath, chest tightness, coughing, and wheezing. May use albuterol rescue inhaler 2 puffs 5 to 15 minutes prior to strenuous physical activities. Monitor frequency of use.    Seasonal and perennial allergic rhinitis Past history - 2019 skin testing was positive to dust mites, grass, trees and borderline to mouse. [name] daily used caused too much dryness.  Interim history - some PND but doesn't like nasal sprays.  Continue environmental control measures.  May use over the counter antihistamines such as Zyrtec (cetirizine), Claritin  (loratadine), Allegra (fexofenadine), or Xyzal (levocetirizine) daily as needed. Nasal saline spray (i.e., Simply Saline) or nasal saline lavage (i.e., NeilMed) is recommended as needed and prior to medicated nasal sprays. Declined medicated nasal spray.    Peripheral eosinophilia Past history - 04/18/2022 eos elevated at 2100. Last eos were only 800, work up by heme/onc unremarkable.  Follow up with heme/onc as scheduled.    Assessment and Plan: Crystal [name] is a 71 y.o. female with: ***  No follow-ups on file.  No orders of the defined types were placed in this encounter.  Lab Orders  No laboratory test(s) ordered today    Diagnostics: Spirometry:  Tracings reviewed. Her effort: {Blank single:19197::"Good reproducible efforts.","It was hard to get consistent efforts and there is a question as to whether this reflects a maximal maneuver.","Poor effort, data can not be interpreted."} FVC: ***L FEV1: ***L, ***% predicted FEV1/FVC ratio: ***% Interpretation: {Blank single:19197::"Spirometry consistent with mild obstructive disease","Spirometry consistent with moderate obstructive disease","Spirometry consistent with severe obstructive disease","Spirometry consistent with possible restrictive disease","Spirometry consistent with mixed obstructive and restrictive disease","Spirometry uninterpretable due to technique","Spirometry consistent with normal pattern","No overt abnormalities noted given today's efforts"}.  Please see scanned spirometry results for details.  Skin Testing: {Blank single:19197::"Select foods","Environmental allergy panel","Environmental allergy panel and select foods","Food allergy panel","None","Deferred due to recent antihistamines use"}. *** Results discussed with patient/family.   Medication List:  Current Outpatient Medications  Medication Sig Dispense Refill  . albuterol (VENTOLIN HFA) 108 (90 Base) MCG/ACT inhaler Inhale 2 puffs into the lungs every 4 (four)  hours as needed for wheezing or shortness of breath (coughing fits). 18 g  1  . aspirin EC 81 MG tablet Take 1 tablet (81 mg total) by mouth 2 (two) times daily. For 2 weeks then once a day for 2 weeks after that. 45 tablet 0  . atorvastatin (LIPITOR) 40 MG tablet TAKE 1 TABLET BY MOUTH EVERY DAY 90 tablet 3  . Budeson-Glycopyrrol-Formoterol (BREZTRI AEROSPHERE) 160-9-4.8 MCG/ACT AERO Inhale 2 puffs into the lungs in the morning and at bedtime. with spacer and rinse mouth afterwards. 10.7 g 5  . Calcium Carb-Cholecalciferol 600-800 MG-UNIT TABS Take 1 tablet by mouth in the morning and at bedtime.     . Cholecalciferol (VITAMIN D PO) Take 2,000 Units by mouth daily.     Marland Kitchen CRANBERRY PO Take 1 tablet by mouth 3 (three) times a week.    . denosumab (PROLIA) 60 MG/ML SOSY injection Inject 60 mg into the skin every 6 (six) months. 180 mL 0  . folic acid (FOLVITE) 400 MCG tablet Take 400 mcg by mouth daily.    Marland Kitchen HYDROcodone-acetaminophen (NORCO/VICODIN) 5-325 MG tablet Take 1-2 tablets by mouth every 6 (six) hours as needed for moderate pain or severe pain (post op pain). 40 tablet 0  . loratadine (CLARITIN) 10 MG tablet Take 10 mg by mouth daily.    Marland Kitchen losartan (COZAAR) 50 MG tablet TAKE 1 TABLET EVERY DAY (ANNUAL APPOINTMENT DUE IN MAY, MUST SEE MD FOR FUTURE REFILLS) 90 tablet 3  . metoprolol succinate (TOPROL-XL) 25 MG 24 hr tablet TAKE 1 TABLET BY MOUTH DAILY. ANNUAL APPT IS DUE W/LABS MUST SEE PROVIDER FOR FUTURE REFILLS 90 tablet 3  . NON FORMULARY Pt uses a cpap nightly    . tiZANidine (ZANAFLEX) 4 MG tablet Take 1 tablet (4 mg total) by mouth every 6 (six) hours as needed for muscle spasms. 40 tablet 1  . tolterodine (DETROL LA) 4 MG 24 hr capsule Take 4 mg by mouth daily.     No current facility-administered medications for this visit.   Allergies: Allergies  Allergen Reactions  . Tape     Removed a layer of skin when removed, please use paper tape  . Ace Inhibitors Cough  . Penicillins  Rash   I reviewed her past medical history, social history, family history, and environmental history and no significant changes have been reported from her previous visit.  Review of Systems  Constitutional:  Negative for appetite change, chills, fever and unexpected weight change.  HENT:  Positive for postnasal drip. Negative for congestion and rhinorrhea.   Eyes:  Negative for itching.  Respiratory:  Negative for cough, chest tightness, shortness of breath and wheezing.   Gastrointestinal:  Negative for abdominal pain.  Skin:  Negative for rash.  Allergic/Immunologic: Positive for environmental allergies.  Neurological:  Negative for headaches.   Objective: There were no vitals taken for this visit. There is no height or weight on file to calculate BMI. Physical Exam Vitals and nursing note reviewed.  Constitutional:      Appearance: Normal appearance. She is well-developed.  HENT:     Head: Normocephalic and atraumatic.     Right Ear: External ear normal.     Left Ear: External ear normal.     Nose: Nose normal.     Mouth/Throat:     Mouth: Mucous membranes are moist.     Pharynx: Oropharynx is clear.  Eyes:     Conjunctiva/sclera: Conjunctivae normal.  Cardiovascular:     Rate and Rhythm: Normal rate and regular rhythm.     Heart sounds:  Normal heart sounds. No murmur heard. Pulmonary:     Effort: Pulmonary effort is normal.     Breath sounds: Normal breath sounds. No wheezing, rhonchi or rales.  Musculoskeletal:     Cervical back: Neck supple.  Skin:    General: Skin is warm.     Findings: No rash.  Neurological:     Mental Status: She is alert and oriented to person, place, and time.  Psychiatric:        Behavior: Behavior normal.  Previous notes and tests were reviewed. The plan was reviewed with the patient/family, and all questions/concerned were addressed.  It was my pleasure to see Crystal [name] today and participate in her care. Please feel free to contact me  with any questions or concerns.  Sincerely,  Wyline Mood, DO Allergy & Immunology  [facility] office: [phone] [facility] office: [phone]

## 2023-06-08 ENCOUNTER — Encounter: Payer: Self-pay | Admitting: Allergy

## 2023-06-08 ENCOUNTER — Ambulatory Visit: Payer: Medicare HMO | Admitting: Allergy

## 2023-06-08 VITALS — BP 122/74 | HR 85 | Temp 98.5°F | Resp 16

## 2023-06-08 DIAGNOSIS — J3089 Other allergic rhinitis: Secondary | ICD-10-CM

## 2023-06-08 DIAGNOSIS — J301 Allergic rhinitis due to pollen: Secondary | ICD-10-CM

## 2023-06-08 DIAGNOSIS — J4489 Other specified chronic obstructive pulmonary disease: Secondary | ICD-10-CM

## 2023-06-08 NOTE — Patient Instructions (Addendum)
Breathing: Today's breathing test was normal.  Daily controller medication(s):  Continue Breztri 2 puffs once day with spacer and rinse mouth afterwards. During respiratory infections/flares:  Increase Breztri to 2 puffs twice a day for 1-2 weeks until your breathing symptoms return to baseline.  Pretreat with albuterol 2 puffs. May use albuterol rescue inhaler 2 puffs every 4 to 6 hours as needed for shortness of breath, chest tightness, coughing, and wheezing. May use albuterol rescue inhaler 2 puffs 5 to 15 minutes prior to strenuous physical activities. Monitor frequency of use. Breathing control goals:  Full participation in all desired activities (may need albuterol before activity) Albuterol use two times or less a week on average (not counting use with activity) Cough interfering with sleep two times or less a month Oral steroids no more than once a year No hospitalizations   Allergic rhinitis: 2019 skin testing was positive to dust mites, grass, trees and borderline to mouse. Continue environmental control measures.  May use over the counter antihistamines such as Zyrtec (cetirizine), Claritin (loratadine), Allegra (fexofenadine), or Xyzal (levocetirizine) daily as needed. Nasal saline spray (i.e., Simply Saline) or nasal saline lavage (i.e., NeilMed) is recommended as needed and prior to medicated nasal sprays.  Follow up in 6 months or sooner if needed.

## 2023-06-12 ENCOUNTER — Ambulatory Visit (AMBULATORY_SURGERY_CENTER): Payer: Medicare HMO | Admitting: *Deleted

## 2023-06-12 ENCOUNTER — Encounter: Payer: Self-pay | Admitting: Genetic Counselor

## 2023-06-12 ENCOUNTER — Telehealth: Payer: Self-pay | Admitting: Genetic Counselor

## 2023-06-12 ENCOUNTER — Encounter: Payer: Self-pay | Admitting: Gastroenterology

## 2023-06-12 VITALS — Ht 63.0 in | Wt 188.0 lb

## 2023-06-12 DIAGNOSIS — Z86010 Personal history of colonic polyps: Secondary | ICD-10-CM

## 2023-06-12 MED ORDER — NA SULFATE-K SULFATE-MG SULF 17.5-3.13-1.6 GM/177ML PO SOLN
1.0000 | Freq: Once | ORAL | 0 refills | Status: AC
Start: 2023-06-12 — End: 2023-06-12

## 2023-06-12 NOTE — Telephone Encounter (Signed)
I attempted to contact Crystal [name] to discuss her genetic testing results (49 genes). I left a voicemail requesting she call me back at [phone].  Lalla Brothers, MS, [facility] Genetic Counselor [email or web address] (P) [phone]

## 2023-06-12 NOTE — Telephone Encounter (Signed)
I contacted Ms. Lada to discuss her genetic testing results. No pathogenic variants were identified in the 49 genes analyzed. Of note, a variant of uncertain significance was identified in the [facility] gene. Detailed clinic note to follow.  The test report has been scanned into EPIC and is located under the Molecular Pathology section of the Results Review tab.  A portion of the result report is included below for reference.   Lalla Brothers, MS, [facility] Genetic Counselor [email or web address] (P) [phone]

## 2023-06-12 NOTE — Progress Notes (Signed)
Pre visit over telephone. Instructions sent through secure email and MyChart Patient states all questions answered.  No egg or soy allergy known to patient  No issues known to pt with past sedation with any surgeries or procedures Patient denies ever being told they had issues or difficulty with intubation  No FH of Malignant Hyperthermia Pt is not on diet pills Pt is not on  home 02  Pt is not on blood thinners  Pt denies issues with constipation  No A fib or A flutter Have any cardiac testing pending--NO Pt instructed to use [email or web address] or GoodRx for a price reduction on prep

## 2023-06-15 ENCOUNTER — Ambulatory Visit: Payer: Self-pay | Admitting: Genetic Counselor

## 2023-06-15 ENCOUNTER — Other Ambulatory Visit: Payer: Self-pay | Admitting: Internal Medicine

## 2023-06-15 ENCOUNTER — Other Ambulatory Visit: Payer: Self-pay

## 2023-06-15 DIAGNOSIS — Z1379 Encounter for other screening for genetic and chromosomal anomalies: Secondary | ICD-10-CM

## 2023-06-15 NOTE — Progress Notes (Signed)
HPI:   Ms. Wolverton was previously seen in the [facility] due to a personal history of colon polyps and family history of cancer. Please refer to our prior cancer genetics clinic note for more information regarding our discussion, assessment and recommendations, at the time. Ms. Valentino recent genetic test results were disclosed to her, as were recommendations warranted by these results. These results and recommendations are discussed in more detail below.  CANCER HISTORY:  Oncology History   No history exists.    FAMILY HISTORY:  We obtained a detailed, 4-generation family history.  Significant diagnoses are noted below:        Ms. Ellery has 2 sisters, 1 was diagnosed with breast cancer at age 60 and 1 was diagnosed with cervical cancer in her 42s and breast cancer in her early 13s. Ms. Lora father was diagnosed with bladder and colon cancer at age 74, they did not know if it was bladder cancer with metastasis to the colon or colon cancer with metastasis to the bladder, he had a history of colon polyps and died at age 89. Her paternal grandmother was diagnosed with thyroid cancer at age 49, she died at 7 and her paternal grandfather was diagnosed with melanoma at an unknown age, he is deceased. Ms. Rakestraw is unaware of previous family history of genetic testing for hereditary cancer risks. There is no reported Ashkenazi Jewish ancestry.   GENETIC TEST RESULTS:  The [facility] Custom Panel found no pathogenic mutations.   The Custom Hereditary Cancers Panel offered by [facility] includes sequencing and/or deletion duplication testing of the following 49 genes: APC, ATM, AXIN2, BAP1, BARD1, BMPR1A, BRCA1, BRCA2, BRIP1, CDH1, CDK4, CDKN2A (p14ARF and p16INK4a only), CHEK2, CTNNA1, DICER1, EPCAM (Deletion/duplication testing only), FH, GREM1 (promoter region duplication testing only), HOXB13, KIT, MBD4, MEN1, MITF, MLH1, MSH2, MSH3, MSH6, MUTYH, NF1, NHTL1, PALB2, PDGFRA,  PMS2, POLD1, POLE, POT1, PRKAR1A, PTEN, RAD51C, RAD51D, RB1, RET, SMAD4, SMARCA4. STK11, TP53, TSC1, TSC2, and VHL.    The test report has been scanned into EPIC and is located under the Molecular Pathology section of the Results Review tab.  A portion of the result report is included below for reference. Genetic testing reported out on 06/11/2023.      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  At this time, it is unknown if this variant is associated with an increased risk for cancer or if it is benign, but most uncertain variants are reclassified to benign. It should not be used to make medical management decisions. With time, we suspect the laboratory will determine the significance of this variant, if any. If the laboratory reclassifies this variant, we will attempt to contact Ms. Schmoker to discuss it further.   Even though a pathogenic variant was not identified, possible explanations for the personal history of colon polyps and family history of cancer may include: There may be no hereditary risk for cancer in the family. Her personal history of colon polyps and family history of cancer may be due to other genetic or environmental factors. There may be a gene mutation in one of these genes that current testing methods cannot detect, but that chance is small. There could be another gene that has not yet been discovered, or that we have not yet tested, that is responsible for her personal history of colon polyps and family history of cancer. It is also possible there is a hereditary cause for  the cancer in the family that Ms. Cranmer did not inherit.  Therefore, it is important to remain in touch with cancer genetics in the future so that we can continue to offer Ms. Aynes the most up to date genetic testing.   ADDITIONAL GENETIC TESTING:  We discussed with Ms. Insley that her genetic testing was fairly  extensive.  If there are genes identified to increase cancer risk that can be analyzed in the future, we would be happy to discuss and coordinate this testing at that time.    CANCER SCREENING RECOMMENDATIONS:  Ms. Philipp test result is considered negative (normal).  This means that we have not identified a hereditary cause for her personal history of colon polyps and family history of cancer.    An individual's cancer risk and medical management are not determined by genetic test results alone. Overall cancer risk assessment incorporates additional factors, including personal medical history, family history, and any available genetic information that may result in a personalized plan for cancer prevention and surveillance. Therefore, it is recommended she continue to follow the cancer management and screening guidelines provided by her healthcare providers.  RECOMMENDATIONS FOR FAMILY MEMBERS:   Since she did not inherit a mutation in a cancer predisposition gene included on this panel, her children could not have inherited a mutation from her in one of these genes. Individuals in this family might be at some increased risk of developing cancer, over the general population risk, due to the family history of cancer. We recommend women in this family have a yearly mammogram beginning at age 73, or 104 years younger than the earliest onset of cancer, an annual clinical breast exam, and perform monthly breast self-exams. Other members of the family may still carry a pathogenic variant in one of these genes that Ms. Mourey did not inherit. Based on the family history, we recommend her sisters who have a history of breast cancer consider genetic counseling and testing.  We do not recommend familial testing for the [facility] variant of uncertain significance (VUS).  FOLLOW-UP:  Cancer genetics is a rapidly advancing field and it is possible that new genetic tests will be appropriate for her and/or her  family members in the future. We encouraged her to remain in contact with cancer genetics on an annual basis so we can update her personal and family histories and let her know of advances in cancer genetics that may benefit this family.   Our contact number was provided. Ms. Backhus questions were answered to her satisfaction, and she knows she is welcome to call us at anytime with additional questions or concerns.   Lalla Brothers, MS, [facility] Genetic Counselor [email or web address] (P) [phone]

## 2023-06-18 ENCOUNTER — Telehealth: Payer: Self-pay | Admitting: Internal Medicine

## 2023-06-18 NOTE — Telephone Encounter (Signed)
Pt has ov tomorrow with Dr. Maple Hudson I will fax back clearance when I return to  the office. Please leave encounter open

## 2023-06-18 NOTE — Telephone Encounter (Signed)
Patient needs clearance for hip replacement 10/29. Next available with App or CY isnt until 10/7. Would it be possible to have her seen in held spot on CY's schedule? Her tentative appointment date is only being held until all her clearances are signed off on. Please advise and route back to me so that I can call patient with response.

## 2023-06-19 ENCOUNTER — Ambulatory Visit: Payer: Medicare HMO | Admitting: Internal Medicine

## 2023-06-19 ENCOUNTER — Encounter: Payer: Self-pay | Admitting: Internal Medicine

## 2023-06-19 VITALS — BP 104/70 | HR 83 | Temp 97.3°F | Ht 63.0 in | Wt 191.0 lb

## 2023-06-19 DIAGNOSIS — G4733 Obstructive sleep apnea (adult) (pediatric): Secondary | ICD-10-CM

## 2023-06-19 DIAGNOSIS — J449 Chronic obstructive pulmonary disease, unspecified: Secondary | ICD-10-CM

## 2023-06-19 NOTE — Assessment & Plan Note (Signed)
She continues to benefit from CPAP with good compliance and control. Plan-continue AutoPap 5-15.  She understands to discuss this with her anesthesiologist so that coverage can be maintained while she is in recovery after surgery.

## 2023-06-19 NOTE — Progress Notes (Signed)
HPI Female Smoker followed for OSA/ oral appliance, complicated by allergic rhinitis, anxiety, COPD, HBP, GERD HST 07/20/2018-AHI 20/hour, desaturation to 81%, body weight 192 pounds PFT 09/04/21- WNL.  ====================================================================   12/16/22- 72 year old female former Smoker (42pkyrs) followed for OSA, complicated by allergic rhinitis, anxiety, COPD, HBP, GERD Allergist  and PCP were following COPD    Covid vax-4 Phizer Flu vax- CPAP auto 5-15/ [employer] Download- compliance  99%, AHI 0.3/ hr Body weight today-201 lbs Needs surgical clearance for LTKR> Crystal [name] She is very comfortable with her CPAP and has no concerns.  Download reviewed. I advised her to take her CPAP with her for use in the recovery room after surgery, and to discuss this with her anesthesiologist. She is aware of some coronary disease and has been cleared by cardiology for pending surgery. Breathing now is quite comfortable.  She reports an asthma episode last summer.  Her allergist put her on Markus Daft which has worked very well.  She has only needed to use her rescue inhaler about 6 times this year so far. She is clear from a pulmonary/sleep medicine standpoint to go forward with the planned surgery. CT chest low dose screening 07/17/22-  IMPRESSION: 1. Lung-RADS 1, negative. Continue annual screening with low-dose chest CT without contrast in 12 months. 2. Moderate coronary artery calcifications of the LAD. 3. Aortic Atherosclerosis (ICD10-I70.0) and Emphysema (ICD10-J43.9).  06/19/23- 72 year old female former Smoker (45pkyrs) followed for OSA, complicated by allergic rhinitis, anxiety, COPD, HBP, GERD Allergist  and PCP were following COPD   -Breztri, alubterol hfa,  CPAP auto 5-15/ [employer] Download- compliance  100%, AHI 0.3/hr Body weight today-191 lbs Needing clearance for R THR in October. Spirometries through her allergist have been within normal limits.  She uses Breztri  once daily and uses her rescue albuterol inhaler only ahead of exercise such as her water exercise class.  She denies any recent significant exacerbation and feels quite stable. CPAP download reviewed.  She is doing very well and reports sleeping well with CPAP. No reasons to defer or change surgical plans are identified at this visit.  ROS-see HPI   + = positive Constitutional:    weight loss, night sweats, fevers, chills, +fatigue, lassitude. HEENT:    headaches, difficulty swallowing, tooth/dental problems, sore throat,       sneezing, itching, ear ache, nasal congestion, post nasal drip, snoring CV:    chest pain, orthopnea, PND, +swelling in lower extremities, anasarca,                                   dizziness, palpitations Resp:  + shortness of breath with exertion or at rest.                productive cough,   non-productive cough, coughing up of blood.              change in color of mucus.  wheezing.   Skin:    rash or lesions. GI:  No-   heartburn, indigestion, abdominal pain, nausea, vomiting, diarrhea,                 change in bowel habits, loss of appetite GU: dysuria, change in color of urine, no urgency or frequency.   flank pain. MS:   joint pain, stiffness, decreased range of motion, back pain. Neuro-     nothing unusual Psych:  change in mood or affect.  depression or anxiety.  memory loss.  OBJ- Physical Exam General- Alert, Oriented, Affect-appropriate, Distress- none acute, + overweight Skin- rash-none, lesions- none, excoriation- none Lymphadenopathy- none Head- atraumatic            Eyes- Gross vision intact, PERRLA, conjunctivae and secretions clear            Ears- Hearing, canals-normal            Nose- Clear, no-Septal dev, mucus, polyps, erosion, perforation             Throat- Mallampati II-III , mucosa clear , drainage- none, tonsils- atrophic Neck- flexible , trachea midline, no stridor , thyroid nl, carotid no bruit Chest - symmetrical excursion ,  unlabored           Heart/CV- RRR , no murmur , no gallop  , no rub, nl s1 s2                           - JVD- none , edema- none, stasis changes- none, varices- none           Lung- clear to P&A, wheeze- none, cough-none, dullness-none, rub- none           Chest wall-  Abd-  Br/ Gen/ Rectal- Not done, not indicated Extrem- +heavy legs with small superficial varices, but no pitting edema.  Neuro- grossly intact to observation

## 2023-06-19 NOTE — Assessment & Plan Note (Signed)
Spirometry's have been normal through her allergist.  She is stable and well-controlled. Plan-clear for hip surgery from a pulmonary standpoint.

## 2023-06-19 NOTE — Patient Instructions (Addendum)
You are clear from a pulmonary/ sleep medicine standpoint to go forward with planned hip surgery.  Please call if we can help  We can change the pending office visit in February with me to a year from now.

## 2023-06-21 NOTE — Progress Notes (Unsigned)
Cardiology Clinic Note   Date: 06/23/2023 ID: AVENLEIGH UNNERSTALL, DOB 1951/05/31, MRN [id number]  Primary Cardiologist:  Bryan Lemma, MD  Patient Profile    Crystal [name] is a 72 y.o. female who presents to the clinic today for preoperative risk assessment.     Past medical history significant for: Coronary calcifications. CT cardiac scoring 12/26/2021: Coronary calcium score 173 (70th percentile).  Aortic atherosclerosis. Hypertension. Hyperlipidemia. Lipid panel 04/20/2023: LDL 60, HDL 76, TG 44, total 144. GERD. COPD. OSA. On CPAP 100% adherence.     History of Present Illness    Crystal [name] was first evaluated by Dr. Herbie Baltimore on 01/03/2022 for elevated calcium score.  Her statin was increased and the importance of risk factor modification was stressed.  Patient was last seen in the office by me on 11/27/2022 for routine follow-up.  She was doing well at that time and was cleared for left knee arthroplasty.  Today, patient is here for preoperative risk assessment prior to right hip replacement not yet scheduled. She was doing well after left knee replacement when her hip "gave out" and she had to stop PT after 5 weeks. She has since returned to water fitness three times a week. Patient denies shortness of breath or dyspnea on exertion. No chest pain, pressure, or tightness. Every once in a while she will have epigastric discomfort upon awakening that resolves after a few minutes. She has a history of reflux. She will start paying attention to what she eats the night before to see if she can relate to specific triggers. Denies orthopnea or PND. Occasional lower extremity edema that is normal in the morning and progresses throughout the day. She wears compression socks at night when she is winding down from the day. No palpitations.        ROS: All other systems reviewed and are otherwise negative except as noted in History of Present Illness.  Studies Reviewed    EKG  Interpretation Date/Time:  Tuesday June 23 2023 13:16:05 EDT Ventricular Rate:  80 PR Interval:  148 QRS Duration:  88 QT Interval:  364 QTC Calculation: 419 R Axis:   65  Text Interpretation: Normal sinus rhythm Normal ECG When compared with ECG of 31-Aug-2020 07:44, No significant change was found Confirmed by Carlos Levering [phone]) on 06/23/2023 1:25:11 PM           Physical Exam    VS:  BP 118/64   Pulse 80   Ht 5\' 3"  (1.6 m)   Wt 191 lb 9.6 oz (86.9 kg)   SpO2 98%   BMI 33.94 kg/m  , BMI Body mass index is 33.94 kg/m.  GEN: Well nourished, well developed, in no acute distress. Neck: No JVD or carotid bruits. Cardiac:  RRR. No murmurs. No rubs or gallops.   Respiratory:  Respirations regular and unlabored. Clear to auscultation without rales, wheezing or rhonchi. GI: Soft, nontender, nondistended. Extremities: Radials/DP/PT 2+ and equal bilaterally. No clubbing or cyanosis. No edema.  Skin: Warm and dry, no rash. Neuro: Strength intact.  Assessment & Plan    Elevated calcium score/aortic atherosclerosis.  CT calcium score March 2023 173, aortic atherosclerosis.  Patient denies chest pain, pressure or tightness. Epigastric discomfort every once in a while upon awakening that does not sound cardiac in nature. She is instructed to pay attention to what she eats the night before to see if she can detect specific triggers.  Continue aspirin, Lipitor, metoprolol. Hypertension.  BP today 118/64.  Patient denies headaches or dizziness.  Continue metoprolol and losartan. Hyperlipidemia.  LDL July 2024 60, at goal continue Lipitor. Followed by PCP. Preoperative cardiovascular risk assessment.  Left hip replacement. According to the RCRI, patient has a 0.4% risk of MACE. Patient reports activity equivalent to >4.0 METS (water aerobics 3 times a week). Based on ACC/AHA guidelines, PETER LIVERMAN would be at acceptable risk for the planned procedure without further  cardiovascular testing. Ideally aspirin should be continued without interruption, however if the bleeding risk is too great, aspirin may be held for 5-7 days prior to surgery. Please resume aspirin post operatively when it is felt to be safe from a bleeding standpoint.     Disposition: Return in 6 months as previously planned or sooner as needed.          Signed, Etta Grandchild. Ido Wollman, DNP, NP-C

## 2023-06-23 ENCOUNTER — Ambulatory Visit: Payer: Medicare HMO | Attending: Student | Admitting: Student

## 2023-06-23 ENCOUNTER — Encounter: Payer: Self-pay | Admitting: Student

## 2023-06-23 VITALS — BP 118/64 | HR 80 | Ht 63.0 in | Wt 191.6 lb

## 2023-06-23 DIAGNOSIS — E785 Hyperlipidemia, unspecified: Secondary | ICD-10-CM | POA: Diagnosis not present

## 2023-06-23 DIAGNOSIS — R931 Abnormal findings on diagnostic imaging of heart and coronary circulation: Secondary | ICD-10-CM | POA: Diagnosis not present

## 2023-06-23 DIAGNOSIS — I10 Essential (primary) hypertension: Secondary | ICD-10-CM

## 2023-06-23 DIAGNOSIS — Z01810 Encounter for preprocedural cardiovascular examination: Secondary | ICD-10-CM

## 2023-06-23 NOTE — Patient Instructions (Signed)
Medication Instructions:  If you need a refill on your cardiac medications before your next appointment, please call your pharmacy.   Lab Work: None labs ordered If you have labs (blood work) drawn today and your tests are completely normal, you will receive your results only by: MyChart Message (if you have MyChart) OR A paper copy in the mail If you have any lab test that is abnormal or we need to change your treatment, we will call you to review the results.   Testing/Procedures: No testing ordered   Follow-Up: At [facility], you and your health needs are our priority.  As part of our continuing mission to provide you with exceptional heart care, we have created designated Provider Care Teams.  These Care Teams include your primary Cardiologist (physician) and Advanced Practice Providers (APPs -  Physician Assistants and Nurse Practitioners) who all work together to provide you with the care you need, when you need it.  We recommend signing up for the patient portal called "MyChart".  Sign up information is provided on this After Visit Summary.  MyChart is used to connect with patients for Virtual Visits (Telemedicine).  Patients are able to view lab/test results, encounter notes, upcoming appointments, etc.  Non-urgent messages can be sent to your provider as well.   To learn more about what you can do with MyChart, go to [email or web address].    Your next appointment:   6 month(s)  Provider:   Bryan Lemma, MD

## 2023-06-24 NOTE — Telephone Encounter (Signed)
Spoke with patient she states she wont be having hip surgery until October. Patient has already been cleared from surgery by Dr. Maple Hudson. She states [facility] will reach out when they need clearance sent to them. Please leave encounter open

## 2023-07-02 ENCOUNTER — Telehealth: Payer: Self-pay

## 2023-07-02 NOTE — Telephone Encounter (Signed)
Pt Prolia has arrived in office and placed in fridge at nurses station.

## 2023-07-06 DIAGNOSIS — H25813 Combined forms of age-related cataract, bilateral: Secondary | ICD-10-CM | POA: Diagnosis not present

## 2023-07-06 LAB — HM DIABETES EYE EXAM

## 2023-07-07 ENCOUNTER — Encounter: Payer: Self-pay | Admitting: Gastroenterology

## 2023-07-07 ENCOUNTER — Ambulatory Visit (AMBULATORY_SURGERY_CENTER): Payer: Medicare HMO | Admitting: Gastroenterology

## 2023-07-07 VITALS — BP 164/68 | HR 73 | Temp 99.5°F | Resp 10 | Ht 63.0 in | Wt 188.0 lb

## 2023-07-07 DIAGNOSIS — Z86010 Personal history of colonic polyps: Secondary | ICD-10-CM

## 2023-07-07 DIAGNOSIS — D125 Benign neoplasm of sigmoid colon: Secondary | ICD-10-CM

## 2023-07-07 DIAGNOSIS — D122 Benign neoplasm of ascending colon: Secondary | ICD-10-CM | POA: Diagnosis not present

## 2023-07-07 DIAGNOSIS — K635 Polyp of colon: Secondary | ICD-10-CM | POA: Diagnosis not present

## 2023-07-07 DIAGNOSIS — D123 Benign neoplasm of transverse colon: Secondary | ICD-10-CM | POA: Diagnosis not present

## 2023-07-07 DIAGNOSIS — Z09 Encounter for follow-up examination after completed treatment for conditions other than malignant neoplasm: Secondary | ICD-10-CM | POA: Diagnosis not present

## 2023-07-07 DIAGNOSIS — I10 Essential (primary) hypertension: Secondary | ICD-10-CM | POA: Diagnosis not present

## 2023-07-07 DIAGNOSIS — F32A Depression, unspecified: Secondary | ICD-10-CM | POA: Diagnosis not present

## 2023-07-07 DIAGNOSIS — F419 Anxiety disorder, unspecified: Secondary | ICD-10-CM | POA: Diagnosis not present

## 2023-07-07 DIAGNOSIS — D120 Benign neoplasm of cecum: Secondary | ICD-10-CM | POA: Diagnosis not present

## 2023-07-07 DIAGNOSIS — Z1211 Encounter for screening for malignant neoplasm of colon: Secondary | ICD-10-CM | POA: Diagnosis not present

## 2023-07-07 MED ORDER — SODIUM CHLORIDE 0.9 % IV SOLN
500.0000 mL | Freq: Once | INTRAVENOUS | Status: DC
Start: 2023-07-07 — End: 2023-07-07

## 2023-07-07 NOTE — Progress Notes (Signed)
Called to room to assist during endoscopic procedure.  Patient ID and intended procedure confirmed with present staff. Received instructions for my participation in the procedure from the performing physician.  

## 2023-07-07 NOTE — Op Note (Signed)
[facility] Patient Name: Crystal [name] Procedure Date: 07/07/2023 9:12 AM MRN: [id number] Endoscopist: Viviann Spare P. Adela Lank , MD, [id number] Age: 72 Referring MD:  Date of Birth: [birth year] Gender: Female Account #: [id number] Procedure:                Colonoscopy Indications:              High risk colon cancer surveillance: Personal                            history of colonic polyps - 2017 - 10 adenomas,                            2018 - 5 adenomas, 05/2020 - 7 adenomas / sessile                            serrated polyps, fatherh had colon cancer dx age                            31s, negative genetic testing Medicines:                Monitored Anesthesia Care Procedure:                Pre-Anesthesia Assessment:                           - Prior to the procedure, a History and Physical                            was performed, and patient medications and                            allergies were reviewed. The patient's tolerance of                            previous anesthesia was also reviewed. The risks                            and benefits of the procedure and the sedation                            options and risks were discussed with the patient.                            All questions were answered, and informed consent                            was obtained. Prior Anticoagulants: The patient has                            taken no anticoagulant or antiplatelet agents. ASA                            Grade Assessment: III - A patient with severe  systemic disease. After reviewing the risks and                            benefits, the patient was deemed in satisfactory                            condition to undergo the procedure.                           After obtaining informed consent, the colonoscope                            was passed under direct vision. Throughout the                            procedure, the patient's  blood pressure, pulse, and                            oxygen saturations were monitored continuously. The                            PCF-HQ190L Colonoscope [id number] was introduced                            through the anus and advanced to the the cecum,                            identified by appendiceal orifice and ileocecal                            valve. The colonoscopy was performed without                            difficulty. The patient tolerated the procedure                            well. The quality of the bowel preparation was                            good. The ileocecal valve, appendiceal orifice, and                            rectum were photographed. Scope In: 9:28:17 AM Scope Out: 9:52:19 AM Scope Withdrawal Time: 0 hours 18 minutes 10 seconds  Total Procedure Duration: 0 hours 24 minutes 2 seconds  Findings:                 The perianal and digital rectal examinations were                            normal.                           A diminutive polyp was found in the cecum. The  polyp was sessile. The polyp was removed with a                            cold snare. Resection and retrieval were complete.                           Two sessile polyps were found in the ascending                            colon. The polyps were 1 to 3 mm in size. These                            polyps were removed with a cold snare. Resection                            and retrieval were complete.                           A 3 mm polyp was found in the splenic flexure. The                            polyp was sessile. The polyp was removed with a                            cold snare. Resection and retrieval were complete.                           A 3 to 4 mm polyp was found in the sigmoid colon.                            The polyp was sessile. The polyp was removed with a                            cold snare. Resection and retrieval were complete.                            A few small angiodysplastic lesions were found in                            the cecum.                           Multiple small-mouthed diverticula were found in                            the sigmoid colon.                           Internal hemorrhoids were found during retroflexion.                           The exam was otherwise without abnormality. Complications:            No  immediate complications. Estimated blood loss:                            Minimal. Estimated Blood Loss:     Estimated blood loss was minimal. Impression:               - One diminutive polyp in the cecum, removed with a                            cold snare. Resected and retrieved.                           - Two 1 to 3 mm polyps in the ascending colon,                            removed with a cold snare. Resected and retrieved.                           - One 3 mm polyp at the splenic flexure, removed                            with a cold snare. Resected and retrieved.                           - One 3 to 4 mm polyp in the sigmoid colon, removed                            with a cold snare. Resected and retrieved.                           - A few colonic angiodysplastic lesions.                           - Diverticulosis in the sigmoid colon.                           - Internal hemorrhoids.                           - The examination was otherwise normal. Recommendation:           - Patient has a contact number available for                            emergencies. The signs and symptoms of potential                            delayed complications were discussed with the                            patient. Return to normal activities tomorrow.                            Written discharge instructions were provided to the  patient.                           - Resume previous diet.                           - Continue present medications.                            - Await pathology results. Viviann Spare P. Sanaii Caporaso, MD 07/07/2023 9:59:17 AM This report has been signed electronically.

## 2023-07-07 NOTE — Progress Notes (Signed)
[facility] History and Physical   Primary Care Physician:  Corwin Levins, MD   Reason for Procedure:   History of colon polyps  Plan:    colonoscopy     HPI: Crystal [name] is a 72 y.o. female  here for colonoscopy surveillance - last exam 05/2020 - 7 polyps removed.   Patient denies any bowel symptoms at this time. Father had colon cancer. Otherwise feels well without any cardiopulmonary symptoms.   I have discussed risks / benefits of anesthesia and endoscopic procedure with [name] and they wish to proceed with the exams as outlined today.    Past Medical History:  Diagnosis Date   ALLERGIC RHINITIS    Allergy    ANXIETY    no medations needed   Arthritis    Asthma    Bronchitis    Cataract    no surgery   COLONIC POLYPS, HX OF    Complication of anesthesia    oxygen sat dropped during endoscopy    COPD    pt unsure of this dx   Cough    DEPRESSION    Dyspnea    Elevated coronary artery calcium score 12/2021   Agatston score 173-all LAD   Fatty liver    GERD    GLUCOSE INTOLERANCE    Hyperlipidemia    HYPERTENSION    IBS    Impaired glucose tolerance    Obstructive sleep apnea 06/25/2018   OSTEOPENIA    Osteopenia    Osteoporosis 07/30/2017   PERIPHERAL EDEMA    Pneumonia    Pneumonia, organism unspecified(486)    Sleep apnea    Oral Aparatus   TOBACCO USE DISORDER/SMOKER-SMOKING CESSATION DISCUSSED     Past Surgical History:  Procedure Laterality Date   birthmark removed     from back as a child   bladder mesh     2012   CESAREAN SECTION     x2   CHOLECYSTECTOMY     COLONOSCOPY  2018   TA   MOUTH SURGERY  04/25/2020   tissue graft -room of mouth to front gums   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   Procedure: LEFT TOTAL HIP ARTHROPLASTY ANTERIOR APPROACH;  Surgeon: Marcene Corning, MD;  Location: [facility];  Service: Orthopedics;  Laterality: Left;   TOTAL KNEE ARTHROPLASTY Left 02/17/2023   Procedure: LEFT TOTAL  KNEE ARTHROPLASTY;  Surgeon: Marcene Corning, MD;  Location: [facility];  Service: Orthopedics;  Laterality: Left;   WISDOM TOOTH EXTRACTION      Prior to Admission medications   Medication Sig Start Date End Date Taking? Authorizing Provider  aspirin EC 81 MG tablet Take 1 tablet (81 mg total) by mouth 2 (two) times daily. For 2 weeks then once a day for 2 weeks after that. 02/18/23  Yes Elodia Florence, PA-C  atorvastatin (LIPITOR) 40 MG tablet TAKE 1 TABLET BY MOUTH EVERY DAY 01/07/23  Yes Marykay Lex, MD  Budeson-Glycopyrrol-Formoterol (BREZTRI AEROSPHERE) 160-9-4.8 MCG/ACT AERO Inhale 2 puffs into the lungs in the morning and at bedtime. with spacer and rinse mouth afterwards. 02/02/23  Yes Ellamae Sia, DO  Calcium Carb-Cholecalciferol 600-800 MG-UNIT TABS Take 1 tablet by mouth in the morning and at bedtime.  10/20/09  Yes [provider]  Cholecalciferol (VITAMIN D PO) Take 2,000 Units by mouth daily.    Yes [provider]  folic acid (FOLVITE) 400 MCG tablet Take 400 mcg by mouth daily.   Yes [provider]  loratadine (CLARITIN) 10 MG tablet Take 10 mg by mouth daily.   Yes [provider]  losartan (COZAAR) 50 MG tablet TAKE 1 TABLET EVERY DAY (ANNUAL APPOINTMENT DUE IN MAY, MUST SEE MD FOR FUTURE REFILLS) 01/07/23  Yes Corwin Levins, MD  metoprolol succinate (TOPROL-XL) 25 MG 24 hr tablet TAKE 1 TABLET BY MOUTH DAILY. ANNUAL APPT IS DUE W/LABS MUST SEE PROVIDER FOR FUTURE REFILLS 05/13/23  Yes Corwin Levins, MD  NON FORMULARY Pt uses a cpap nightly   Yes [provider]  albuterol (VENTOLIN HFA) 108 (90 Base) MCG/ACT inhaler Inhale 2 puffs into the lungs every 4 (four) hours as needed for wheezing or shortness of breath (coughing fits). 10/27/22   Ellamae Sia, DO  CRANBERRY PO Take 1 tablet by mouth 3 (three) times a week. Patient not taking: Reported on 06/23/2023    [provider]  denosumab (PROLIA) 60 MG/ML SOSY injection INJECT 1 SYRINGE  UNDER THE SKIN ONCE EVERY 6 MONTHS 06/15/23   Corwin Levins, MD  tiZANidine (ZANAFLEX) 4 MG tablet Take 1 tablet (4 mg total) by mouth every 6 (six) hours as needed for muscle spasms. 02/18/23 02/18/24  Elodia Florence, PA-C  valACYclovir (VALTREX) 1000 MG tablet Take 1,000 mg by mouth 2 (two) times daily. 03/22/23   [provider]    Current Outpatient Medications  Medication Sig Dispense Refill   aspirin EC 81 MG tablet Take 1 tablet (81 mg total) by mouth 2 (two) times daily. For 2 weeks then once a day for 2 weeks after that. 45 tablet 0   atorvastatin (LIPITOR) 40 MG tablet TAKE 1 TABLET BY MOUTH EVERY DAY 90 tablet 3   Budeson-Glycopyrrol-Formoterol (BREZTRI AEROSPHERE) 160-9-4.8 MCG/ACT AERO Inhale 2 puffs into the lungs in the morning and at bedtime. with spacer and rinse mouth afterwards. 10.7 g 5   Calcium Carb-Cholecalciferol 600-800 MG-UNIT TABS Take 1 tablet by mouth in the morning and at bedtime.      Cholecalciferol (VITAMIN D PO) Take 2,000 Units by mouth daily.      folic acid (FOLVITE) 400 MCG tablet Take 400 mcg by mouth daily.     loratadine (CLARITIN) 10 MG tablet Take 10 mg by mouth daily.     losartan (COZAAR) 50 MG tablet TAKE 1 TABLET EVERY DAY (ANNUAL APPOINTMENT DUE IN MAY, MUST SEE MD FOR FUTURE REFILLS) 90 tablet 3   metoprolol succinate (TOPROL-XL) 25 MG 24 hr tablet TAKE 1 TABLET BY MOUTH DAILY. ANNUAL APPT IS DUE W/LABS MUST SEE PROVIDER FOR FUTURE REFILLS 90 tablet 3   NON FORMULARY Pt uses a cpap nightly     albuterol (VENTOLIN HFA) 108 (90 Base) MCG/ACT inhaler Inhale 2 puffs into the lungs every 4 (four) hours as needed for wheezing or shortness of breath (coughing fits). 18 g 1   CRANBERRY PO Take 1 tablet by mouth 3 (three) times a week. (Patient not taking: Reported on 06/23/2023)     denosumab (PROLIA) 60 MG/ML SOSY injection INJECT 1 SYRINGE UNDER THE SKIN ONCE EVERY 6 MONTHS 180 mL 0   tiZANidine (ZANAFLEX) 4 MG tablet Take 1 tablet (4 mg total) by mouth  every 6 (six) hours as needed for muscle spasms. 40 tablet 1   valACYclovir (VALTREX) 1000 MG tablet Take 1,000 mg by mouth 2 (two) times daily.     Current Facility-Administered Medications  Medication Dose Route Frequency Provider Last Rate Last Admin   0.9 %  sodium chloride infusion  500 mL Intravenous Once Grason Brailsford, Willaim Rayas, MD        Allergies as of 07/07/2023 - Review Complete 07/07/2023  Allergen Reaction Noted   Tape  02/03/2023   Ace inhibitors Cough 03/27/2011   Penicillins Rash 12/20/2009    Family History  Problem Relation Age of Onset   Basal cell carcinoma Mother 27 - 34   Other Mother        hx of hysterectomy in her early 55s for fibroid cysts   Colon cancer Father 78       either colon met to bladder or bladder met to colon   Bladder Cancer Father 26   Basal cell carcinoma Father    Colon polyps Father    Breast cancer Sister 43       +lump and tamoxifen; eventually had bilateral mastectomies   Allergic rhinitis Sister    Breast cancer Sister        dx. early 20s; s/p BL mastectomies   Cervical cancer Sister        dx. late 64s   Skin cancer Brother        NOS   Heart attack Maternal Uncle 67   Heart attack Maternal Grandmother 62   Basal cell carcinoma Maternal Grandfather    Thyroid cancer Paternal Grandmother 90   Melanoma Paternal Grandfather    Anxiety disorder Other    Hypothyroidism Other    Hyperlipidemia Other    Diabetes Other    Esophageal cancer Neg Hx    Stomach cancer Neg Hx    Rectal cancer Neg Hx     Social History   Socioeconomic History   Marital status: Widowed    Spouse name: Not on file   Number of children: 2   Years of education: Not on file   Highest education level: Not on file  Occupational History   Occupation: Data processing manager industries, signage for [employer]    Comment: Retired  Tobacco Use   Smoking status: Former    Current packs/day: 0.00    Average packs/day: 1 pack/day for 45.0 years (45.0  ttl pk-yrs)    Types: Cigarettes    Start date: 10/20/1977    Quit date: 10/13/2022    Years since quitting: 0.7   Smokeless tobacco: Never  Vaping Use   Vaping status: Never Used  Substance and Sexual Activity   Alcohol use: Yes    Alcohol/week: 21.0 standard drinks of alcohol    Types: 7 Glasses of wine, 14 Standard drinks or equivalent per week    Comment: couple of drinks 1-2 nites per week    Drug use: No   Sexual activity: Not on file  Other Topics Concern   Not on file  Social History Narrative   She indicates that she has been most of her life sedentary, but started exercising in the fall of October 2022: Has started doing water aerobics.      She had work up to 4 days a week, but has had to scale down to about 2 days a week because of knee pain.   Social Determinants of Health   Financial Resource Strain: Low Risk  (04/13/2023)   Overall Financial Resource Strain (CARDIA)    Difficulty of Paying Living Expenses: Not hard at all  Food Insecurity: No Food Insecurity (04/13/2023)   Hunger Vital Sign    Worried About Running Out of Food in the Last Year: Never true    Ran Out of Food in the  Last Year: Never true  Transportation Needs: No Transportation Needs (04/13/2023)   PRAPARE - Administrator, Civil Service (Medical): No    Lack of Transportation (Non-Medical): No  Physical Activity: Sufficiently Active (04/13/2023)   Exercise Vital Sign    Days of Exercise per Week: 4 days    Minutes of Exercise per Session: 60 min  Stress: No Stress Concern Present (04/13/2023)   [employer] of Occupational Health - Occupational Stress Questionnaire    Feeling of Stress : Not at all  Social Connections: Socially Isolated (04/13/2023)   Social Connection and Isolation Panel [NHANES]    Frequency of Communication with Friends and Family: Three times a week    Frequency of Social Gatherings with Friends and Family: Three times a week    Attends Religious Services: Never     Active Member of Clubs or Organizations: No    Attends Banker Meetings: Never    Marital Status: Widowed  Intimate Partner Violence: Not At Risk (04/13/2023)   Humiliation, Afraid, Rape, and Kick questionnaire    Fear of Current or Ex-Partner: No    Emotionally Abused: No    Physically Abused: No    Sexually Abused: No    Review of Systems: All other review of systems negative except as mentioned in the HPI.  Physical Exam: Vital signs BP (!) 152/83   Pulse 77   Temp 99.5 F (37.5 C)   Resp 12   Ht 5\' 3"  (1.6 m)   Wt 188 lb (85.3 kg)   SpO2 98%   BMI 33.30 kg/m   General:   Alert,  Well-developed, pleasant and cooperative in NAD Lungs:  Clear throughout to auscultation.   Heart:  Regular rate and rhythm Abdomen:  Soft, nontender and nondistended.   Neuro/Psych:  Alert and cooperative. Normal mood and affect. A and O x 3  Harlin Rain, MD [facility]

## 2023-07-07 NOTE — Patient Instructions (Signed)
Discharge instructions given. Handouts on polyps,Diverticulosis and Hemorrhoids. Resume previous medications. YOU HAD AN ENDOSCOPIC PROCEDURE TODAY AT THE [facility]:   Refer to the procedure report that was given to you for any specific questions about what was found during the examination.  If the procedure report does not answer your questions, please call your gastroenterologist to clarify.  If you requested that your care partner not be given the details of your procedure findings, then the procedure report has been included in a sealed envelope for you to review at your convenience later.  YOU SHOULD EXPECT: Some feelings of bloating in the abdomen. Passage of more gas than usual.  Walking can help get rid of the air that was put into your GI tract during the procedure and reduce the bloating. If you had a lower endoscopy (such as a colonoscopy or flexible sigmoidoscopy) you may notice spotting of blood in your stool or on the toilet paper. If you underwent a bowel prep for your procedure, you may not have a normal bowel movement for a few days.  Please Note:  You might notice some irritation and congestion in your nose or some drainage.  This is from the oxygen used during your procedure.  There is no need for concern and it should clear up in a day or so.  SYMPTOMS TO REPORT IMMEDIATELY:  Following lower endoscopy (colonoscopy or flexible sigmoidoscopy):  Excessive amounts of blood in the stool  Significant tenderness or worsening of abdominal pains  Swelling of the abdomen that is new, acute  Fever of 100F or higher   For urgent or emergent issues, a gastroenterologist can be reached at any hour by calling [phone]. Do not use MyChart messaging for urgent concerns.    DIET:  We do recommend a small meal at first, but then you may proceed to your regular diet.  Drink plenty of fluids but you should avoid alcoholic beverages for 24 hours.  ACTIVITY:  You should  plan to take it easy for the rest of today and you should NOT DRIVE or use heavy machinery until tomorrow (because of the sedation medicines used during the test).    FOLLOW UP: Our staff will call the number listed on your records the next business day following your procedure.  We will call around 7:15- 8:00 am to check on you and address any questions or concerns that you may have regarding the information given to you following your procedure. If we do not reach you, we will leave a message.     If any biopsies were taken you will be contacted by phone or by letter within the next 1-3 weeks.  Please call us at [phone] if you have not heard about the biopsies in 3 weeks.    SIGNATURES/CONFIDENTIALITY: You and/or your care partner have signed paperwork which will be entered into your electronic medical record.  These signatures attest to the fact that that the information above on your After Visit Summary has been reviewed and is understood.  Full responsibility of the confidentiality of this discharge information lies with you and/or your care-partner.

## 2023-07-07 NOTE — Progress Notes (Signed)
Uneventful anesthetic. Report to pacu rn. Vss. Care resumed by rn. 

## 2023-07-07 NOTE — Progress Notes (Signed)
VS by SS  Pt's states no medical or surgical changes since previsit or office visit.  

## 2023-07-08 ENCOUNTER — Telehealth: Payer: Self-pay | Admitting: *Deleted

## 2023-07-08 NOTE — Telephone Encounter (Signed)
  Follow up Call-     07/07/2023    8:40 AM  Call back number  Post procedure Call Back phone  # [phone]  Permission to leave phone message Yes    Post procedure follow up phone call. No answer at number given.  Left message on voicemail.

## 2023-07-09 LAB — SURGICAL PATHOLOGY

## 2023-07-10 ENCOUNTER — Ambulatory Visit (INDEPENDENT_AMBULATORY_CARE_PROVIDER_SITE_OTHER): Payer: Medicare HMO

## 2023-07-10 DIAGNOSIS — M810 Age-related osteoporosis without current pathological fracture: Secondary | ICD-10-CM | POA: Diagnosis not present

## 2023-07-10 MED ORDER — DENOSUMAB 60 MG/ML ~~LOC~~ SOSY
60.0000 mg | PREFILLED_SYRINGE | Freq: Once | SUBCUTANEOUS | Status: AC
Start: 2023-07-10 — End: 2023-07-10
  Administered 2023-07-10: 60 mg via SUBCUTANEOUS

## 2023-07-10 NOTE — Progress Notes (Signed)
Pt was given Prolia injection w/o any complicatoins.

## 2023-07-13 DIAGNOSIS — M1611 Unilateral primary osteoarthritis, right hip: Secondary | ICD-10-CM | POA: Diagnosis not present

## 2023-07-16 ENCOUNTER — Other Ambulatory Visit: Payer: Self-pay

## 2023-07-17 ENCOUNTER — Telehealth: Payer: Self-pay | Admitting: Internal Medicine

## 2023-07-17 ENCOUNTER — Telehealth (HOSPITAL_BASED_OUTPATIENT_CLINIC_OR_DEPARTMENT_OTHER): Payer: Self-pay | Admitting: *Deleted

## 2023-07-17 NOTE — Telephone Encounter (Signed)
   Pre-operative Risk Assessment    Patient Name: Crystal [name]  DOB: Mar 24, 1951 MRN: [id number]      Request for Surgical Clearance    Procedure:   Right Total Anterior Hip Arthroplasty.  Date of Surgery:  Clearance TBD                                 Surgeon:  Dr. Marcene Corning Surgeon's Group or Practice Name:  [facility] Phone number:  [phone] Fax number:  [phone]   Type of Clearance Requested:   - Medical  - Pharmacy:  Hold Aspirin Not indicated.   Type of Anesthesia:  Spinal   Additional requests/questions:  Patient last appointment was with Carlyon Shadow, NP on 06/23/2023  Signed, Emmit Pomfret   07/17/2023, 10:34 AM

## 2023-07-17 NOTE — Telephone Encounter (Signed)
Placed on providers desk

## 2023-07-17 NOTE — Telephone Encounter (Signed)
We have received surgical clearance pw from [facility] and it has been placed in the providers box.   Please fax [phone]

## 2023-07-20 ENCOUNTER — Ambulatory Visit (HOSPITAL_COMMUNITY)
Admission: RE | Admit: 2023-07-20 | Discharge: 2023-07-20 | Disposition: A | Discharge status: Home / Self Care | Payer: Medicare HMO | Source: Ambulatory Visit | Attending: Internal Medicine | Admitting: Internal Medicine

## 2023-07-20 DIAGNOSIS — Z122 Encounter for screening for malignant neoplasm of respiratory organs: Secondary | ICD-10-CM | POA: Insufficient documentation

## 2023-07-20 DIAGNOSIS — F17210 Nicotine dependence, cigarettes, uncomplicated: Secondary | ICD-10-CM | POA: Diagnosis not present

## 2023-07-20 DIAGNOSIS — Z87891 Personal history of nicotine dependence: Secondary | ICD-10-CM | POA: Insufficient documentation

## 2023-07-20 NOTE — Telephone Encounter (Signed)
Form faxed

## 2023-07-21 NOTE — Telephone Encounter (Signed)
Note from 06/19/23 that gives pulm clearance for hip surgery was faxed to [facility] [phone].

## 2023-07-21 NOTE — Telephone Encounter (Signed)
[facility] is calling for surgical clearance for this patient. It was faxed in on 07/16/23.

## 2023-07-23 ENCOUNTER — Telehealth: Payer: Self-pay | Admitting: *Deleted

## 2023-07-23 NOTE — Telephone Encounter (Signed)
Note regarding surgery clearance from 06/19/23 was faxed to [facility] [phone].

## 2023-07-24 ENCOUNTER — Telehealth: Payer: Self-pay | Admitting: Cardiology

## 2023-07-24 ENCOUNTER — Telehealth: Payer: Self-pay | Admitting: Internal Medicine

## 2023-07-24 NOTE — Telephone Encounter (Signed)
Please see ov notes from Carlos Levering, NP for clearance. I will fax clearance notes to surgeon office. Pt has also requested to pick up a hard copy of clearance. I will place a copy of clearance notes up at the front desk for the pt to pick up.   I s/w the pt and she is aware to come to the [facility] location to pick up the clearance notes.

## 2023-07-24 NOTE — Telephone Encounter (Deleted)
Error

## 2023-07-24 NOTE — Telephone Encounter (Signed)
See clearance notes and ov notes from Carlos Levering, NP for further info if needed.

## 2023-07-24 NOTE — Telephone Encounter (Signed)
Patient would like copy of the surgical clearance form. Patient would like to come by office to pick up this afternoon. Patient phone number is [phone].

## 2023-07-24 NOTE — Telephone Encounter (Signed)
Patient stated she will need to get a hard copy of her clearance for orthopedic surgery and wants to stop by today to collect it.

## 2023-07-27 ENCOUNTER — Encounter: Payer: Self-pay | Admitting: Internal Medicine

## 2023-07-27 NOTE — Telephone Encounter (Signed)
I spoke with the pt and notified of that we do not fill out the forms for surgery clearance and that our office protocol is to do risk assessments. Her notes were already faxed to [facility]. Pt verbalized understanding and nothing further needed.

## 2023-07-29 ENCOUNTER — Other Ambulatory Visit: Payer: Self-pay

## 2023-08-07 ENCOUNTER — Other Ambulatory Visit: Payer: Self-pay | Admitting: Acute Care

## 2023-08-07 DIAGNOSIS — Z122 Encounter for screening for malignant neoplasm of respiratory organs: Secondary | ICD-10-CM

## 2023-08-07 DIAGNOSIS — Z87891 Personal history of nicotine dependence: Secondary | ICD-10-CM

## 2023-08-07 DIAGNOSIS — F17210 Nicotine dependence, cigarettes, uncomplicated: Secondary | ICD-10-CM

## 2023-08-11 ENCOUNTER — Other Ambulatory Visit: Payer: Self-pay | Admitting: Orthopaedic Surgery

## 2023-08-14 NOTE — Patient Instructions (Signed)
SURGICAL WAITING ROOM VISITATION  Patients having surgery or a procedure may have no more than 2 support people in the waiting area - these visitors may rotate.    Children under the age of 10 must have an adult with them who is not the patient.  Due to an increase in RSV and influenza rates and associated hospitalizations, children ages 7 and under may not visit patients in [facility] hospitals.  If the patient needs to stay at the hospital during part of their recovery, the visitor guidelines for inpatient rooms apply. Pre-op nurse will coordinate an appropriate time for 1 support person to accompany patient in pre-op.  This support person may not rotate.    Please refer to the [facility] website for the visitor guidelines for Inpatients (after your surgery is over and you are in a regular room).       Your procedure is scheduled on:  08/25/2023    Report to [facility] Main Entrance    Report to admitting at  0700 AM   Call this number if you have problems the morning of surgery [phone]   Do not eat food :After Midnight.   After Midnight you may have the following liquids until _ 0630_____ AM DAY OF SURGERY  Water Non-Citrus Juices (without pulp, NO RED-Apple, White grape, White cranberry) Black Coffee (NO MILK/CREAM OR CREAMERS, sugar ok)  Clear Tea (NO MILK/CREAM OR CREAMERS, sugar ok) regular and decaf                             Plain Jell-O (NO RED)                                           Fruit ices (not with fruit pulp, NO RED)                                     Popsicles (NO RED)                                                               Sports drinks like Gatorade (NO RED)                  The day of surgery:  Drink ONE (1) Pre-Surgery Clear Ensure or G2 at   0630AM  ( have completed by ) he morning of surgery. Drink in one sitting. Do not sip.  This drink was given to you during your hospital  pre-op appointment visit. Nothing else to drink  after completing the  Pre-Surgery Clear Ensure or G2.          If you have questions, please contact your surgeon's office.       Oral Hygiene is also important to reduce your risk of infection.                                    Remember - BRUSH YOUR TEETH THE MORNING OF SURGERY WITH YOUR REGULAR  TOOTHPASTE  DENTURES WILL BE REMOVED PRIOR TO SURGERY PLEASE DO NOT APPLY "Poly grip" OR ADHESIVES!!!   Do NOT smoke after Midnight   Stop all vitamins and herbal supplements 7 days before surgery.   Take these medicines the morning of surgery with A SIP OF WATER:  inhalers as usual and bring, claritin if needed, metoprolol  DO NOT TAKE ANY ORAL DIABETIC MEDICATIONS DAY OF YOUR SURGERY  Bring CPAP mask and tubing day of surgery.                              You may not have any metal on your body including hair pins, jewelry, and body piercing             Do not wear make-up, lotions, powders, perfumes/cologne, or deodorant  Do not wear nail polish including gel and S&S, artificial/acrylic nails, or any other type of covering on natural nails including finger and toenails. If you have artificial nails, gel coating, etc. that needs to be removed by a nail salon please have this removed prior to surgery or surgery may need to be canceled/ delayed if the surgeon/ anesthesia feels like they are unable to be safely monitored.   Do not shave  48 hours prior to surgery.               Men may shave face and neck.   Do not bring valuables to the hospital. [facility] IS NOT             RESPONSIBLE   FOR VALUABLES.   Contacts, glasses, dentures or bridgework may not be worn into surgery.   Bring small overnight bag day of surgery.   DO NOT BRING YOUR HOME MEDICATIONS TO THE HOSPITAL. PHARMACY WILL DISPENSE MEDICATIONS LISTED ON YOUR MEDICATION LIST TO YOU DURING YOUR ADMISSION IN THE HOSPITAL!    Patients discharged on the day of surgery will not be allowed to drive home.  Someone NEEDS to  stay with you for the first 24 hours after anesthesia.   Special Instructions: Bring a copy of your healthcare power of attorney and living will documents the day of surgery if you haven't scanned them before.              Please read over the following fact sheets you were given: IF YOU HAVE QUESTIONS ABOUT YOUR PRE-OP INSTRUCTIONS PLEASE CALL [phone]   If you received a COVID test during your pre-op visit  it is requested that you wear a mask when out in public, stay away from anyone that may not be feeling well and notify your surgeon if you develop symptoms. If you test positive for Covid or have been in contact with anyone that has tested positive in the last 10 days please notify you surgeon.      Pre-operative 5 CHG Bath Instructions   You can play a key role in reducing the risk of infection after surgery. Your skin needs to be as free of germs as possible. You can reduce the number of germs on your skin by washing with CHG (chlorhexidine gluconate) soap before surgery. CHG is an antiseptic soap that kills germs and continues to kill germs even after washing.   DO NOT use if you have an allergy to chlorhexidine/CHG or antibacterial soaps. If your skin becomes reddened or irritated, stop using the CHG and notify one of our RNs at [phone].   Please shower  with the CHG soap starting 4 days before surgery using the following schedule:     Please keep in mind the following:  DO NOT shave, including legs and underarms, starting the day of your first shower.   You may shave your face at any point before/day of surgery.  Place clean sheets on your bed the day you start using CHG soap. Use a clean washcloth (not used since being washed) for each shower. DO NOT sleep with pets once you start using the CHG.   CHG Shower Instructions:  If you choose to wash your hair and private area, wash first with your normal shampoo/soap.  After you use shampoo/soap, rinse your hair and body  thoroughly to remove shampoo/soap residue.  Turn the water OFF and apply about 3 tablespoons (45 ml) of CHG soap to a CLEAN washcloth.  Apply CHG soap ONLY FROM YOUR NECK DOWN TO YOUR TOES (washing for 3-5 minutes)  DO NOT use CHG soap on face, private areas, open wounds, or sores.  Pay special attention to the area where your surgery is being performed.  If you are having back surgery, having someone wash your back for you may be helpful. Wait 2 minutes after CHG soap is applied, then you may rinse off the CHG soap.  Pat dry with a clean towel  Put on clean clothes/pajamas   If you choose to wear lotion, please use ONLY the CHG-compatible lotions on the back of this paper.     Additional instructions for the day of surgery: DO NOT APPLY any lotions, deodorants, cologne, or perfumes.   Put on clean/comfortable clothes.  Brush your teeth.  Ask your nurse before applying any prescription medications to the skin.      CHG Compatible Lotions   Aveeno Moisturizing lotion  Cetaphil Moisturizing Cream  Cetaphil Moisturizing Lotion  Clairol Herbal Essence Moisturizing Lotion, Dry Skin  Clairol Herbal Essence Moisturizing Lotion, Extra Dry Skin  Clairol Herbal Essence Moisturizing Lotion, Normal Skin  Curel Age Defying Therapeutic Moisturizing Lotion with Alpha Hydroxy  Curel Extreme Care Body Lotion  Curel Soothing Hands Moisturizing Hand Lotion  Curel Therapeutic Moisturizing Cream, Fragrance-Free  Curel Therapeutic Moisturizing Lotion, Fragrance-Free  Curel Therapeutic Moisturizing Lotion, Original Formula  Eucerin Daily Replenishing Lotion  Eucerin Dry Skin Therapy Plus Alpha Hydroxy Crme  Eucerin Dry Skin Therapy Plus Alpha Hydroxy Lotion  Eucerin Original Crme  Eucerin Original Lotion  Eucerin Plus Crme Eucerin Plus Lotion  Eucerin TriLipid Replenishing Lotion  Keri Anti-Bacterial Hand Lotion  Keri Deep Conditioning Original Lotion Dry Skin Formula Softly Scented  Keri  Deep Conditioning Original Lotion, Fragrance Free Sensitive Skin Formula  Keri Lotion Fast Absorbing Fragrance Free Sensitive Skin Formula  Keri Lotion Fast Absorbing Softly Scented Dry Skin Formula  Keri Original Lotion  Keri Skin Renewal Lotion Keri Silky Smooth Lotion  Keri Silky Smooth Sensitive Skin Lotion  Nivea Body Creamy Conditioning Oil  Nivea Body Extra Enriched Teacher, adult education Moisturizing Lotion Nivea Crme  Nivea Skin Firming Lotion  NutraDerm 30 Skin Lotion  NutraDerm Skin Lotion  NutraDerm Therapeutic Skin Cream  NutraDerm Therapeutic Skin Lotion  ProShield Protective Hand Cream  Provon moisturizing lotion

## 2023-08-14 NOTE — Progress Notes (Signed)
Anesthesia Review:  PCP: DR Jetty Duhamel  05/04/23  Cardiologist : Gilman Schmidt [place],NP LOV 06/23/23  Pulm- Clinton Young LVO 06/19/23  Chest x-ray : CT Chest- 08/07/23  EKG : 06/23/23  Echo : Stress test: Cardiac Cath :  Activity level: can do a flight of stairs without difficutly  Sleep Study/ CPAP : has cpap  Fasting Blood Sugar :      / Checks Blood Sugar -- times a day:   Blood Thinner/ Instructions /Last Dose: ASA / Instructions/ Last Dose :    81 mg aspirin - instructed by surgeon office    NO orders at preop appt .

## 2023-08-14 NOTE — Progress Notes (Signed)
Surgery orders requested via Epic inbox. °

## 2023-08-18 ENCOUNTER — Encounter (HOSPITAL_COMMUNITY)
Admission: RE | Admit: 2023-08-18 | Discharge: 2023-08-18 | Disposition: A | Discharge status: Home / Self Care | Payer: Medicare HMO | Source: Ambulatory Visit | Attending: Orthopaedic Surgery | Admitting: Orthopaedic Surgery

## 2023-08-18 ENCOUNTER — Encounter (HOSPITAL_COMMUNITY): Payer: Self-pay

## 2023-08-18 ENCOUNTER — Other Ambulatory Visit: Payer: Self-pay

## 2023-08-18 VITALS — BP 130/75 | HR 69 | Temp 98.5°F | Resp 16 | Ht 63.0 in | Wt 188.6 lb

## 2023-08-18 DIAGNOSIS — Z01818 Encounter for other preprocedural examination: Secondary | ICD-10-CM

## 2023-08-18 DIAGNOSIS — Z01812 Encounter for preprocedural laboratory examination: Secondary | ICD-10-CM | POA: Insufficient documentation

## 2023-08-18 DIAGNOSIS — I10 Essential (primary) hypertension: Secondary | ICD-10-CM | POA: Diagnosis not present

## 2023-08-18 LAB — BASIC METABOLIC PANEL
Anion gap: 10 (ref 5–15)
BUN: 17 mg/dL (ref 8–23)
CO2: 25 mmol/L (ref 22–32)
Calcium: 9.4 mg/dL (ref 8.9–10.3)
Chloride: 102 mmol/L (ref 98–111)
Creatinine, Ser: 0.52 mg/dL (ref 0.44–1.00)
GFR, Estimated: >60 mL/min (ref >60)
Glucose, Bld: 99 mg/dL (ref 70–99)
Potassium: 4.5 mmol/L (ref 3.5–5.1)
Sodium: 137 mmol/L (ref 135–145)

## 2023-08-18 LAB — CBC
HCT: 39.3 % (ref 36.0–46.0)
Hemoglobin: 13 g/dL (ref 12.0–15.0)
MCH: 31.5 pg (ref 26.0–34.0)
MCHC: 33.1 g/dL (ref 30.0–36.0)
MCV: 95.2 fL (ref 80.0–100.0)
Platelets: 272 10*3/uL (ref 150–400)
RBC: 4.13 MIL/uL (ref 3.87–5.11)
RDW: 14.4 % (ref 11.5–15.5)
WBC: 7 10*3/uL (ref 4.0–10.5)
nRBC: 0 % (ref 0.0–0.2)

## 2023-08-18 LAB — SURGICAL PCR SCREEN
MRSA, PCR: NEGATIVE
Staphylococcus aureus: NEGATIVE

## 2023-08-18 NOTE — Care Plan (Signed)
Ortho Bundle Case Management Note  Patient Details  Name: Crystal [name] MRN: [id number] Date of Birth: 08-07-1951  spoke with patient. will discharge to home with family to assist. has DME. OPPT set up with [facility]. recieved packet in mail. discussed. questions answered. no further needs at this time. Patient and MD in agreement with plan. Choice offered                     DME Arranged:    DME Agency:     HH Arranged:    HH Agency:     Additional Comments: Please contact me with any questions of if this plan should need to change.  Shauna Hugh,  RN,BSN,MHA,CCM  [facility]  [phone] 08/18/2023, 3:55 PM

## 2023-08-20 NOTE — H&P (View-Only) (Signed)
 Anesthesia Chart Review   Case: [id number] Date/Time: 08/25/23 4332   Procedure: RIGHT TOTAL HIP ARTHROPLASTY ANTERIOR APPROACH (Right: Hip)   Anesthesia type: Spinal   Pre-op diagnosis: RIGHT HIP DEGENERATIVE JOINT DISEASE   Location: [facility] 06 / [facility]   Surgeons: Marcene Corning, MD       DISCUSSION:72 y.o. former smoker with h/o HTN, asthma, OSA, right hip djd scheduled for above procedure 08/25/2023 with Dr. Marcene Corning.   Pt last seen by cardiology 06/23/2023. Per OV note, "Preoperative cardiovascular risk assessment.  Left hip replacement. According to the RCRI, patient has a 0.4% risk of MACE. Patient reports activity equivalent to >4.0 METS (water aerobics 3 times a week). Based on ACC/AHA guidelines, Crystal [name] would be at acceptable risk for the planned procedure without further cardiovascular testing. Ideally aspirin should be continued without interruption, however if the bleeding risk is too great, aspirin may be held for 5-7 days prior to surgery. Please resume aspirin post operatively when it is felt to be safe from a bleeding standpoint."  Pt last seen by pulmonology 06/19/2023. Per OV note, "clear for hip surgery from a pulmonary standpoint." VS: BP 130/75   Pulse 69   Temp 36.9 C (Oral)   Resp 16   Ht 5\' 3"  (1.6 m)   Wt 85.5 kg   SpO2 100%   BMI 33.41 kg/m   PROVIDERS: Corwin Levins, MD is PCP   Jetty Duhamel, MD is Pulmonologist   Primary Cardiologist:  Bryan Lemma, MD  LABS: Labs reviewed: Acceptable for surgery. (all labs ordered are listed, but only abnormal results are displayed)  Labs Reviewed  SURGICAL PCR SCREEN  BASIC METABOLIC PANEL  CBC  TYPE AND SCREEN     IMAGES:   EKG:   CV:  Past Medical History:  Diagnosis Date   ALLERGIC RHINITIS    Allergy    ANXIETY    no medations needed   Arthritis    Asthma    Bronchitis    Cataract    no surgery   COLONIC POLYPS, HX OF    Complication of anesthesia    oxygen sat  dropped during endoscopy    COPD    pt unsure of this dx   Cough    DEPRESSION    Dyspnea    Elevated coronary artery calcium score 12/2021   Agatston score 173-all LAD   Fatty liver    GERD    GLUCOSE INTOLERANCE    Hyperlipidemia    HYPERTENSION    IBS    Impaired glucose tolerance    Obstructive sleep apnea 06/25/2018   OSTEOPENIA    Osteopenia    Osteoporosis 07/30/2017   PERIPHERAL EDEMA    Pneumonia    Pneumonia, organism unspecified(486)    Sleep apnea    cpap   TOBACCO USE DISORDER/SMOKER-SMOKING CESSATION DISCUSSED     Past Surgical History:  Procedure Laterality Date   birthmark removed     from back as a child   bladder mesh     2012   CESAREAN SECTION     x2   CHOLECYSTECTOMY     COLONOSCOPY  2018   TA   MOUTH SURGERY  04/25/2020   tissue graft -room of mouth to front gums   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   Procedure: LEFT TOTAL HIP ARTHROPLASTY ANTERIOR APPROACH;  Surgeon: Marcene Corning, MD;  Location: [facility];  Service: Orthopedics;  Laterality: Left;   TOTAL KNEE ARTHROPLASTY Left 02/17/2023  Procedure: LEFT TOTAL KNEE ARTHROPLASTY;  Surgeon: Marcene Corning, MD;  Location: [facility];  Service: Orthopedics;  Laterality: Left;   WISDOM TOOTH EXTRACTION      MEDICATIONS:  albuterol (VENTOLIN HFA) 108 (90 Base) MCG/ACT inhaler   aspirin EC 81 MG tablet   atorvastatin (LIPITOR) 40 MG tablet   Budeson-Glycopyrrol-Formoterol (BREZTRI AEROSPHERE) 160-9-4.8 MCG/ACT AERO   Calcium Carb-Cholecalciferol 600-800 MG-UNIT TABS   Cholecalciferol (VITAMIN D PO)   denosumab (PROLIA) 60 MG/ML SOSY injection   folic acid (FOLVITE) 400 MCG tablet   loratadine (CLARITIN) 10 MG tablet   losartan (COZAAR) 50 MG tablet   metoprolol succinate (TOPROL-XL) 25 MG 24 hr tablet   NON FORMULARY   tiZANidine (ZANAFLEX) 4 MG tablet   valACYclovir (VALTREX) 1000 MG tablet   No current facility-administered medications for this encounter.    [name],  PA-C [facility] Pre-Surgical Testing [phone]

## 2023-08-20 NOTE — Progress Notes (Signed)
Anesthesia Chart Review   Case: [id number] Date/Time: 08/25/23 4332   Procedure: RIGHT TOTAL HIP ARTHROPLASTY ANTERIOR APPROACH (Right: Hip)   Anesthesia type: Spinal   Pre-op diagnosis: RIGHT HIP DEGENERATIVE JOINT DISEASE   Location: [facility] 06 / [facility]   Surgeons: Marcene Corning, MD       DISCUSSION:72 y.o. former smoker with h/o HTN, asthma, OSA, right hip djd scheduled for above procedure 08/25/2023 with Dr. Marcene Corning.   Pt last seen by cardiology 06/23/2023. Per OV note, "Preoperative cardiovascular risk assessment.  Left hip replacement. According to the RCRI, patient has a 0.4% risk of MACE. Patient reports activity equivalent to >4.0 METS (water aerobics 3 times a week). Based on ACC/AHA guidelines, Crystal [name] would be at acceptable risk for the planned procedure without further cardiovascular testing. Ideally aspirin should be continued without interruption, however if the bleeding risk is too great, aspirin may be held for 5-7 days prior to surgery. Please resume aspirin post operatively when it is felt to be safe from a bleeding standpoint."  Pt last seen by pulmonology 06/19/2023. Per OV note, "clear for hip surgery from a pulmonary standpoint." VS: BP 130/75   Pulse 69   Temp 36.9 C (Oral)   Resp 16   Ht 5\' 3"  (1.6 m)   Wt 85.5 kg   SpO2 100%   BMI 33.41 kg/m   PROVIDERS: Corwin Levins, MD is PCP   Jetty Duhamel, MD is Pulmonologist   Primary Cardiologist:  Bryan Lemma, MD  LABS: Labs reviewed: Acceptable for surgery. (all labs ordered are listed, but only abnormal results are displayed)  Labs Reviewed  SURGICAL PCR SCREEN  BASIC METABOLIC PANEL  CBC  TYPE AND SCREEN     IMAGES:   EKG:   CV:  Past Medical History:  Diagnosis Date   ALLERGIC RHINITIS    Allergy    ANXIETY    no medations needed   Arthritis    Asthma    Bronchitis    Cataract    no surgery   COLONIC POLYPS, HX OF    Complication of anesthesia    oxygen sat  dropped during endoscopy    COPD    pt unsure of this dx   Cough    DEPRESSION    Dyspnea    Elevated coronary artery calcium score 12/2021   Agatston score 173-all LAD   Fatty liver    GERD    GLUCOSE INTOLERANCE    Hyperlipidemia    HYPERTENSION    IBS    Impaired glucose tolerance    Obstructive sleep apnea 06/25/2018   OSTEOPENIA    Osteopenia    Osteoporosis 07/30/2017   PERIPHERAL EDEMA    Pneumonia    Pneumonia, organism unspecified(486)    Sleep apnea    cpap   TOBACCO USE DISORDER/SMOKER-SMOKING CESSATION DISCUSSED     Past Surgical History:  Procedure Laterality Date   birthmark removed     from back as a child   bladder mesh     2012   CESAREAN SECTION     x2   CHOLECYSTECTOMY     COLONOSCOPY  2018   TA   MOUTH SURGERY  04/25/2020   tissue graft -room of mouth to front gums   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   Procedure: LEFT TOTAL HIP ARTHROPLASTY ANTERIOR APPROACH;  Surgeon: Marcene Corning, MD;  Location: [facility];  Service: Orthopedics;  Laterality: Left;   TOTAL KNEE ARTHROPLASTY Left 02/17/2023  Procedure: LEFT TOTAL KNEE ARTHROPLASTY;  Surgeon: Marcene Corning, MD;  Location: [facility];  Service: Orthopedics;  Laterality: Left;   WISDOM TOOTH EXTRACTION      MEDICATIONS:  albuterol (VENTOLIN HFA) 108 (90 Base) MCG/ACT inhaler   aspirin EC 81 MG tablet   atorvastatin (LIPITOR) 40 MG tablet   Budeson-Glycopyrrol-Formoterol (BREZTRI AEROSPHERE) 160-9-4.8 MCG/ACT AERO   Calcium Carb-Cholecalciferol 600-800 MG-UNIT TABS   Cholecalciferol (VITAMIN D PO)   denosumab (PROLIA) 60 MG/ML SOSY injection   folic acid (FOLVITE) 400 MCG tablet   loratadine (CLARITIN) 10 MG tablet   losartan (COZAAR) 50 MG tablet   metoprolol succinate (TOPROL-XL) 25 MG 24 hr tablet   NON FORMULARY   tiZANidine (ZANAFLEX) 4 MG tablet   valACYclovir (VALTREX) 1000 MG tablet   No current facility-administered medications for this encounter.    [name],  PA-C [facility] [phone]

## 2023-08-25 ENCOUNTER — Ambulatory Visit (HOSPITAL_COMMUNITY): Payer: Medicare HMO

## 2023-08-25 ENCOUNTER — Observation Stay (HOSPITAL_COMMUNITY)
Admission: RE | Admit: 2023-08-25 | Discharge: 2023-08-26 | Disposition: A | Discharge status: Home / Self Care | Payer: Medicare HMO | Attending: Orthopaedic Surgery | Admitting: Orthopaedic Surgery

## 2023-08-25 ENCOUNTER — Other Ambulatory Visit: Payer: Self-pay

## 2023-08-25 ENCOUNTER — Encounter (HOSPITAL_COMMUNITY): Payer: Self-pay | Admitting: Orthopaedic Surgery

## 2023-08-25 ENCOUNTER — Ambulatory Visit (HOSPITAL_COMMUNITY): Payer: Medicare HMO | Admitting: Physician Assistant

## 2023-08-25 ENCOUNTER — Encounter (HOSPITAL_COMMUNITY)
Admission: RE | Disposition: A | Discharge status: Home / Self Care | Payer: Self-pay | Source: Home / Self Care | Attending: Orthopaedic Surgery

## 2023-08-25 ENCOUNTER — Ambulatory Visit (HOSPITAL_COMMUNITY): Payer: Medicare HMO | Admitting: Certified Registered Nurse Anesthetist

## 2023-08-25 DIAGNOSIS — Z7901 Long term (current) use of anticoagulants: Secondary | ICD-10-CM | POA: Insufficient documentation

## 2023-08-25 DIAGNOSIS — M1611 Unilateral primary osteoarthritis, right hip: Secondary | ICD-10-CM | POA: Diagnosis not present

## 2023-08-25 DIAGNOSIS — Z96641 Presence of right artificial hip joint: Secondary | ICD-10-CM | POA: Diagnosis not present

## 2023-08-25 DIAGNOSIS — Z87891 Personal history of nicotine dependence: Secondary | ICD-10-CM | POA: Insufficient documentation

## 2023-08-25 DIAGNOSIS — K219 Gastro-esophageal reflux disease without esophagitis: Secondary | ICD-10-CM | POA: Insufficient documentation

## 2023-08-25 DIAGNOSIS — J449 Chronic obstructive pulmonary disease, unspecified: Secondary | ICD-10-CM | POA: Insufficient documentation

## 2023-08-25 DIAGNOSIS — M1612 Unilateral primary osteoarthritis, left hip: Secondary | ICD-10-CM

## 2023-08-25 DIAGNOSIS — F32A Depression, unspecified: Secondary | ICD-10-CM | POA: Insufficient documentation

## 2023-08-25 DIAGNOSIS — F419 Anxiety disorder, unspecified: Secondary | ICD-10-CM | POA: Diagnosis not present

## 2023-08-25 DIAGNOSIS — Z79899 Other long term (current) drug therapy: Secondary | ICD-10-CM | POA: Diagnosis not present

## 2023-08-25 DIAGNOSIS — I10 Essential (primary) hypertension: Secondary | ICD-10-CM | POA: Insufficient documentation

## 2023-08-25 DIAGNOSIS — Z01818 Encounter for other preprocedural examination: Secondary | ICD-10-CM

## 2023-08-25 HISTORY — PX: TOTAL HIP ARTHROPLASTY: SHX124

## 2023-08-25 LAB — TYPE AND SCREEN
ABO/RH(D): O POS
Antibody Screen: NEGATIVE

## 2023-08-25 SURGERY — ARTHROPLASTY, HIP, TOTAL, ANTERIOR APPROACH
Anesthesia: Spinal | Site: Hip | Laterality: Right

## 2023-08-25 MED ORDER — OXYCODONE HCL 5 MG PO TABS: 10.0000 mg | ORAL_TABLET | ORAL | Status: DC | PRN

## 2023-08-25 MED ORDER — LIDOCAINE 2% (20 MG/ML) 5 ML SYRINGE
INTRAMUSCULAR | Status: DC | PRN
  Administered 2023-08-25: 30 mg via INTRAVENOUS

## 2023-08-25 MED ORDER — ONDANSETRON HCL 4 MG/2ML IJ SOLN
INTRAMUSCULAR | Status: DC | PRN
  Administered 2023-08-25: 4 mg via INTRAVENOUS

## 2023-08-25 MED ORDER — TRANEXAMIC ACID 1000 MG/10ML IV SOLN
2000.0000 mg | Freq: Once | INTRAVENOUS | Status: AC
  Administered 2023-08-25: 2000 mg via TOPICAL
  Filled 2023-08-25: qty 20

## 2023-08-25 MED ORDER — ACETAMINOPHEN 325 MG PO TABS: 325.0000 mg | ORAL_TABLET | Freq: Four times a day (QID) | ORAL | Status: DC | PRN

## 2023-08-25 MED ORDER — ACETAMINOPHEN 500 MG PO TABS
1000.0000 mg | ORAL_TABLET | Freq: Four times a day (QID) | ORAL | Status: DC
Start: 2023-08-25 — End: 2023-08-26
  Administered 2023-08-25 – 2023-08-26 (×3): 1000 mg via ORAL
  Filled 2023-08-25 (×3): qty 2

## 2023-08-25 MED ORDER — LOSARTAN POTASSIUM 50 MG PO TABS
50.0000 mg | ORAL_TABLET | Freq: Every day | ORAL | Status: DC
  Administered 2023-08-26: 50 mg via ORAL
  Filled 2023-08-25: qty 1

## 2023-08-25 MED ORDER — LACTATED RINGERS IV SOLN: INTRAVENOUS | Status: DC

## 2023-08-25 MED ORDER — PHENOL 1.4 % MT LIQD: 1.0000 | OROMUCOSAL | Status: DC | PRN

## 2023-08-25 MED ORDER — BUPIVACAINE LIPOSOME 1.3 % IJ SUSP
INTRAMUSCULAR | Status: DC | PRN
  Administered 2023-08-25: 10 mL

## 2023-08-25 MED ORDER — LACTATED RINGERS IV BOLUS: 500.0000 mL | Freq: Once | INTRAVENOUS | Status: AC

## 2023-08-25 MED ORDER — TRAMADOL HCL 50 MG PO TABS
50.0000 mg | ORAL_TABLET | Freq: Four times a day (QID) | ORAL | Status: DC
  Administered 2023-08-25 – 2023-08-26 (×3): 50 mg via ORAL
  Filled 2023-08-25 (×3): qty 1

## 2023-08-25 MED ORDER — ACETAMINOPHEN 10 MG/ML IV SOLN
INTRAVENOUS | Status: AC
  Filled 2023-08-25: qty 100

## 2023-08-25 MED ORDER — CHLORHEXIDINE GLUCONATE 0.12 % MT SOLN
15.0000 mL | Freq: Once | OROMUCOSAL | Status: AC
Start: 2023-08-25 — End: 2023-08-25
  Administered 2023-08-25: 15 mL via OROMUCOSAL

## 2023-08-25 MED ORDER — HYDROMORPHONE HCL 1 MG/ML IJ SOLN
0.2500 mg | INTRAMUSCULAR | Status: DC | PRN
  Administered 2023-08-25: 0.5 mg via INTRAVENOUS

## 2023-08-25 MED ORDER — BUPIVACAINE-EPINEPHRINE (PF) 0.25% -1:200000 IJ SOLN
INTRAMUSCULAR | Status: DC | PRN
  Administered 2023-08-25: 30 mL

## 2023-08-25 MED ORDER — 0.9 % SODIUM CHLORIDE (POUR BTL) OPTIME
TOPICAL | Status: DC | PRN
  Administered 2023-08-25: 1000 mL

## 2023-08-25 MED ORDER — OXYCODONE HCL 5 MG PO TABS
ORAL_TABLET | ORAL | Status: AC
  Filled 2023-08-25: qty 2

## 2023-08-25 MED ORDER — VANCOMYCIN HCL IN DEXTROSE 1-5 GM/200ML-% IV SOLN: 1000.0000 mg | Freq: Once | INTRAVENOUS | Status: DC

## 2023-08-25 MED ORDER — BUPIVACAINE LIPOSOME 1.3 % IJ SUSP
10.0000 mL | Freq: Once | INTRAMUSCULAR | Status: DC
Start: 2023-08-25 — End: 2023-08-25

## 2023-08-25 MED ORDER — ONDANSETRON HCL 4 MG/2ML IJ SOLN
INTRAMUSCULAR | Status: AC
Start: 2023-08-25 — End: ?
  Filled 2023-08-25: qty 2

## 2023-08-25 MED ORDER — DOCUSATE SODIUM 100 MG PO CAPS
100.0000 mg | ORAL_CAPSULE | Freq: Two times a day (BID) | ORAL | Status: DC
  Administered 2023-08-25 – 2023-08-26 (×2): 100 mg via ORAL
  Filled 2023-08-25 (×2): qty 1

## 2023-08-25 MED ORDER — METHOCARBAMOL 500 MG PO TABS
ORAL_TABLET | ORAL | Status: AC
  Filled 2023-08-25: qty 1

## 2023-08-25 MED ORDER — TRANEXAMIC ACID-NACL 1000-0.7 MG/100ML-% IV SOLN
1000.0000 mg | INTRAVENOUS | Status: AC
  Administered 2023-08-25: 1000 mg via INTRAVENOUS

## 2023-08-25 MED ORDER — METHOCARBAMOL 500 MG PO TABS
500.0000 mg | ORAL_TABLET | Freq: Four times a day (QID) | ORAL | Status: DC | PRN
  Administered 2023-08-25 – 2023-08-26 (×2): 500 mg via ORAL
  Filled 2023-08-25 (×2): qty 1

## 2023-08-25 MED ORDER — ASPIRIN 81 MG PO CHEW
81.0000 mg | CHEWABLE_TABLET | Freq: Two times a day (BID) | ORAL | Status: DC
  Administered 2023-08-25 – 2023-08-26 (×2): 81 mg via ORAL
  Filled 2023-08-25 (×2): qty 1

## 2023-08-25 MED ORDER — BUPIVACAINE LIPOSOME 1.3 % IJ SUSP
INTRAMUSCULAR | Status: AC
  Filled 2023-08-25: qty 10

## 2023-08-25 MED ORDER — LACTATED RINGERS IV BOLUS: 250.0000 mL | Freq: Once | INTRAVENOUS | Status: AC

## 2023-08-25 MED ORDER — PANTOPRAZOLE SODIUM 40 MG PO TBEC
40.0000 mg | DELAYED_RELEASE_TABLET | Freq: Every day | ORAL | Status: DC
  Administered 2023-08-25 – 2023-08-26 (×2): 40 mg via ORAL
  Filled 2023-08-25 (×2): qty 1

## 2023-08-25 MED ORDER — DIPHENHYDRAMINE HCL 12.5 MG/5ML PO ELIX: 12.5000 mg | ORAL_SOLUTION | ORAL | Status: DC | PRN

## 2023-08-25 MED ORDER — METOCLOPRAMIDE HCL 5 MG/ML IJ SOLN: 5.0000 mg | Freq: Three times a day (TID) | INTRAMUSCULAR | Status: DC | PRN

## 2023-08-25 MED ORDER — SODIUM CHLORIDE 0.9 % IV SOLN
2000.0000 mg | INTRAVENOUS | Status: DC
  Filled 2023-08-25: qty 20

## 2023-08-25 MED ORDER — ALBUTEROL SULFATE HFA 108 (90 BASE) MCG/ACT IN AERS: 2.0000 | INHALATION_SPRAY | RESPIRATORY_TRACT | Status: DC | PRN

## 2023-08-25 MED ORDER — TRANEXAMIC ACID-NACL 1000-0.7 MG/100ML-% IV SOLN
INTRAVENOUS | Status: AC
  Filled 2023-08-25: qty 100

## 2023-08-25 MED ORDER — ACETAMINOPHEN 160 MG/5ML PO SOLN
325.0000 mg | Freq: Once | ORAL | Status: DC | PRN
Start: 2023-08-25 — End: 2023-08-25

## 2023-08-25 MED ORDER — ONDANSETRON HCL 4 MG/2ML IJ SOLN
4.0000 mg | Freq: Four times a day (QID) | INTRAMUSCULAR | Status: DC | PRN
  Administered 2023-08-26: 4 mg via INTRAVENOUS
  Filled 2023-08-25: qty 2

## 2023-08-25 MED ORDER — MEPERIDINE HCL 50 MG/ML IJ SOLN: 6.2500 mg | INTRAMUSCULAR | Status: DC | PRN

## 2023-08-25 MED ORDER — ONDANSETRON HCL 4 MG PO TABS: 4.0000 mg | ORAL_TABLET | Freq: Four times a day (QID) | ORAL | Status: DC | PRN

## 2023-08-25 MED ORDER — ALBUTEROL SULFATE (2.5 MG/3ML) 0.083% IN NEBU: 2.5000 mg | INHALATION_SOLUTION | RESPIRATORY_TRACT | Status: DC | PRN

## 2023-08-25 MED ORDER — OXYCODONE HCL 5 MG PO TABS
ORAL_TABLET | ORAL | Status: AC
  Administered 2023-08-25: 10 mg via ORAL
  Filled 2023-08-25: qty 1

## 2023-08-25 MED ORDER — MENTHOL 3 MG MT LOZG: 1.0000 | LOZENGE | OROMUCOSAL | Status: DC | PRN

## 2023-08-25 MED ORDER — POVIDONE-IODINE 10 % EX SWAB: 2.0000 | Freq: Once | CUTANEOUS | Status: DC

## 2023-08-25 MED ORDER — ALUM & MAG HYDROXIDE-SIMETH 200-200-20 MG/5ML PO SUSP: 30.0000 mL | ORAL | Status: DC | PRN

## 2023-08-25 MED ORDER — DROPERIDOL 2.5 MG/ML IJ SOLN: 0.6250 mg | Freq: Once | INTRAMUSCULAR | Status: DC | PRN

## 2023-08-25 MED ORDER — BUPIVACAINE IN DEXTROSE 0.75-8.25 % IT SOLN
INTRATHECAL | Status: DC | PRN
  Administered 2023-08-25: 1.8 mL via INTRATHECAL

## 2023-08-25 MED ORDER — PROPOFOL 10 MG/ML IV BOLUS
INTRAVENOUS | Status: DC | PRN
  Administered 2023-08-25: 10 mg via INTRAVENOUS

## 2023-08-25 MED ORDER — PROPOFOL 500 MG/50ML IV EMUL
INTRAVENOUS | Status: DC | PRN
  Administered 2023-08-25: 100 ug/kg/min via INTRAVENOUS

## 2023-08-25 MED ORDER — METHOCARBAMOL 1000 MG/10ML IJ SOLN: 500.0000 mg | Freq: Four times a day (QID) | INTRAMUSCULAR | Status: DC | PRN

## 2023-08-25 MED ORDER — POLYETHYLENE GLYCOL 3350 17 G PO PACK: 17.0000 g | PACK | Freq: Every day | ORAL | Status: DC | PRN

## 2023-08-25 MED ORDER — CEFAZOLIN SODIUM 1 G IJ SOLR
INTRAMUSCULAR | Status: AC
  Filled 2023-08-25: qty 20

## 2023-08-25 MED ORDER — OXYCODONE HCL 5 MG PO TABS
5.0000 mg | ORAL_TABLET | ORAL | Status: DC | PRN
  Administered 2023-08-25: 5 mg via ORAL
  Administered 2023-08-26: 10 mg via ORAL
  Filled 2023-08-25: qty 2

## 2023-08-25 MED ORDER — BUPIVACAINE-EPINEPHRINE 0.25% -1:200000 IJ SOLN
INTRAMUSCULAR | Status: AC
  Filled 2023-08-25: qty 1

## 2023-08-25 MED ORDER — ORAL CARE MOUTH RINSE: 15.0000 mL | Freq: Once | OROMUCOSAL | Status: AC

## 2023-08-25 MED ORDER — FLEET ENEMA RE ENEM: 1.0000 | ENEMA | Freq: Once | RECTAL | Status: DC | PRN

## 2023-08-25 MED ORDER — LACTATED RINGERS IV SOLN: INTRAVENOUS | Status: AC

## 2023-08-25 MED ORDER — BISACODYL 5 MG PO TBEC: 5.0000 mg | DELAYED_RELEASE_TABLET | Freq: Every day | ORAL | Status: DC | PRN

## 2023-08-25 MED ORDER — TRANEXAMIC ACID-NACL 1000-0.7 MG/100ML-% IV SOLN
1000.0000 mg | Freq: Once | INTRAVENOUS | Status: AC
  Administered 2023-08-25: 1000 mg via INTRAVENOUS
  Filled 2023-08-25: qty 100

## 2023-08-25 MED ORDER — HYDROMORPHONE HCL 1 MG/ML IJ SOLN
INTRAMUSCULAR | Status: AC
  Filled 2023-08-25: qty 1

## 2023-08-25 MED ORDER — VALACYCLOVIR HCL 500 MG PO TABS: 1000.0000 mg | ORAL_TABLET | Freq: Two times a day (BID) | ORAL | Status: DC | PRN

## 2023-08-25 MED ORDER — KCL IN DEXTROSE-NACL 20-5-0.45 MEQ/L-%-% IV SOLN
INTRAVENOUS | Status: DC
  Filled 2023-08-25 (×3): qty 1000

## 2023-08-25 MED ORDER — ACETAMINOPHEN 325 MG PO TABS
325.0000 mg | ORAL_TABLET | Freq: Once | ORAL | Status: DC | PRN
Start: 2023-08-25 — End: 2023-08-25

## 2023-08-25 MED ORDER — DEXAMETHASONE SODIUM PHOSPHATE 10 MG/ML IJ SOLN
10.0000 mg | Freq: Once | INTRAMUSCULAR | Status: AC
  Administered 2023-08-26: 10 mg via INTRAVENOUS
  Filled 2023-08-25: qty 1

## 2023-08-25 MED ORDER — CEFAZOLIN SODIUM-DEXTROSE 2-4 GM/100ML-% IV SOLN
2.0000 g | Freq: Once | INTRAVENOUS | Status: AC
  Administered 2023-08-25: 2 g via INTRAVENOUS

## 2023-08-25 MED ORDER — PROPOFOL 10 MG/ML IV BOLUS
INTRAVENOUS | Status: AC
  Filled 2023-08-25: qty 20

## 2023-08-25 MED ORDER — PHENYLEPHRINE HCL-NACL 20-0.9 MG/250ML-% IV SOLN
INTRAVENOUS | Status: DC | PRN
  Administered 2023-08-25: 25 ug/min via INTRAVENOUS

## 2023-08-25 MED ORDER — PROPOFOL 1000 MG/100ML IV EMUL
INTRAVENOUS | Status: AC
  Filled 2023-08-25: qty 100

## 2023-08-25 MED ORDER — METOPROLOL SUCCINATE ER 25 MG PO TB24
25.0000 mg | ORAL_TABLET | Freq: Every day | ORAL | Status: DC
  Administered 2023-08-26: 25 mg via ORAL
  Filled 2023-08-25: qty 1

## 2023-08-25 MED ORDER — BUDESON-GLYCOPYRROL-FORMOTEROL 160-9-4.8 MCG/ACT IN AERO: 2.0000 | INHALATION_SPRAY | Freq: Every day | RESPIRATORY_TRACT | Status: DC

## 2023-08-25 MED ORDER — METOCLOPRAMIDE HCL 5 MG PO TABS: 5.0000 mg | ORAL_TABLET | Freq: Three times a day (TID) | ORAL | Status: DC | PRN

## 2023-08-25 MED ORDER — HYDROMORPHONE HCL 1 MG/ML IJ SOLN
0.5000 mg | INTRAMUSCULAR | Status: DC | PRN
  Administered 2023-08-25: 1 mg via INTRAVENOUS
  Filled 2023-08-25: qty 1

## 2023-08-25 MED ORDER — ACETAMINOPHEN 10 MG/ML IV SOLN
1000.0000 mg | Freq: Once | INTRAVENOUS | Status: DC | PRN
  Administered 2023-08-25: 1000 mg via INTRAVENOUS

## 2023-08-25 SURGICAL SUPPLY — 52 items
BAG COUNTER SPONGE SURGICOUNT (BAG) IMPLANT
BAG DECANTER FOR FLEXI CONT (MISCELLANEOUS) ×1 IMPLANT
BAG SPNG CNTER NS LX DISP (BAG)
BLADE SAW SGTL 18X1.27X75 (BLADE) ×1 IMPLANT
BOOTIES KNEE HIGH SLOAN (MISCELLANEOUS) ×1 IMPLANT
CELLS DAT CNTRL 66122 CELL SVR (MISCELLANEOUS) ×1 IMPLANT
COVER PERINEAL POST (MISCELLANEOUS) ×1 IMPLANT
COVER SURGICAL LIGHT HANDLE (MISCELLANEOUS) ×1 IMPLANT
CUP ACETABULAR GRIPTON 100 52 (Orthopedic Implant) IMPLANT
DRAPE FOOT SWITCH (DRAPES) ×1 IMPLANT
DRAPE IMP U-DRAPE 54X76 (DRAPES) ×1 IMPLANT
DRAPE STERI IOBAN 125X83 (DRAPES) ×1 IMPLANT
DRAPE U-SHAPE 47X51 STRL (DRAPES) ×1 IMPLANT
DRSG AQUACEL AG ADV 3.5X 6 (GAUZE/BANDAGES/DRESSINGS) ×1 IMPLANT
DURAPREP 26ML APPLICATOR (WOUND CARE) ×1 IMPLANT
ELECT BLADE TIP CTD 4 INCH (ELECTRODE) ×1 IMPLANT
ELECT REM PT RETURN 15FT ADLT (MISCELLANEOUS) ×1 IMPLANT
ELIMINATOR HOLE APEX DEPUY (Hips) IMPLANT
GLOVE BIO SURGEON STRL SZ8 (GLOVE) ×2 IMPLANT
GLOVE BIOGEL PI IND STRL 7.0 (GLOVE) ×1 IMPLANT
GLOVE BIOGEL PI IND STRL 8 (GLOVE) ×2 IMPLANT
GLOVE SURG SYN 7.0 (GLOVE) ×1 IMPLANT
GLOVE SURG SYN 7.0 PF PI (GLOVE) ×1 IMPLANT
GOWN SRG XL LVL 4 BRTHBL STRL (GOWNS) ×1 IMPLANT
GOWN STRL NON-REIN XL LVL4 (GOWNS) ×1
GOWN STRL REUS W/ TWL XL LVL3 (GOWN DISPOSABLE) ×2 IMPLANT
GOWN STRL REUS W/TWL XL LVL3 (GOWN DISPOSABLE) ×2
GRIPTON 100 52 (Orthopedic Implant) ×1 IMPLANT
HEAD CERAMIC 36 PLUS5 (Hips) IMPLANT
HOLDER FOLEY CATH W/STRAP (MISCELLANEOUS) ×1 IMPLANT
KIT TURNOVER KIT A (KITS) IMPLANT
LINER ACETAB NEUTRAL 36ID 520D (Liner) IMPLANT
MANIFOLD NEPTUNE II (INSTRUMENTS) ×1 IMPLANT
NDL HYPO 22X1.5 SAFETY MO (MISCELLANEOUS) ×1 IMPLANT
NEEDLE HYPO 22X1.5 SAFETY MO (MISCELLANEOUS) ×1 IMPLANT
NS IRRIG 1000ML POUR BTL (IV SOLUTION) ×1 IMPLANT
PACK ANTERIOR HIP CUSTOM (KITS) ×1 IMPLANT
PROTECTOR NERVE ULNAR (MISCELLANEOUS) ×1 IMPLANT
RETRACTOR WND ALEXIS 18 MED (MISCELLANEOUS) ×1 IMPLANT
RTRCTR WOUND ALEXIS 18CM MED (MISCELLANEOUS) ×1
SPIKE FLUID TRANSFER (MISCELLANEOUS) ×1 IMPLANT
STEM FEMORAL SZ5 HIGH ACTIS (Stem) IMPLANT
SUT ETHIBOND NAB CT1 #1 30IN (SUTURE) ×2 IMPLANT
SUT STRATAFIX 0 PDS 27 VIOLET (SUTURE) ×1
SUT VIC AB 1 CT1 36 (SUTURE) ×1 IMPLANT
SUT VIC AB 2-0 CT1 27 (SUTURE) ×1
SUT VIC AB 2-0 CT1 TAPERPNT 27 (SUTURE) ×1 IMPLANT
SUT VICRYL AB 3-0 FS1 BRD 27IN (SUTURE) ×1 IMPLANT
SUTURE STRATFX 0 PDS 27 VIOLET (SUTURE) ×1 IMPLANT
SYR 50ML LL SCALE MARK (SYRINGE) ×1 IMPLANT
TRAY FOLEY MTR SLVR 16FR STAT (SET/KITS/TRAYS/PACK) ×1 IMPLANT
YANKAUER SUCT BULB TIP NO VENT (SUCTIONS) ×1 IMPLANT

## 2023-08-25 NOTE — Anesthesia Preprocedure Evaluation (Addendum)
Anesthesia Evaluation  Patient identified by MRN, date of birth, ID band Patient awake    Reviewed: Allergy & Precautions, NPO status , Patient's Chart, lab work & pertinent test results  Airway Mallampati: II  TM Distance: >3 FB Neck ROM: Full    Dental  (+) Teeth Intact, Dental Advisory Given   Pulmonary asthma , sleep apnea , COPD, former smoker   breath sounds clear to auscultation       Cardiovascular hypertension, Pt. on medications and Pt. on home beta blockers + CAD   Rhythm:Regular Rate:Normal     Neuro/Psych  PSYCHIATRIC DISORDERS Anxiety Depression       GI/Hepatic Neg liver ROS,GERD  ,,  Endo/Other  negative endocrine ROS    Renal/GU negative Renal ROS     Musculoskeletal  (+) Arthritis ,    Abdominal   Peds  Hematology negative hematology ROS (+)   Anesthesia Other Findings   Reproductive/Obstetrics                             Anesthesia Physical Anesthesia Plan  ASA: 3  Anesthesia Plan: Spinal   Post-op Pain Management: Tylenol PO (pre-op)*   Induction: Intravenous  PONV Risk Score and Plan: 3 and Ondansetron, Propofol infusion and Dexamethasone  Airway Management Planned: Natural Airway and Nasal Cannula  Additional Equipment: None  Intra-op Plan:   Post-operative Plan:   Informed Consent: I have reviewed the patients History and Physical, chart, labs and discussed the procedure including the risks, benefits and alternatives for the proposed anesthesia with the patient or authorized representative who has indicated his/her understanding and acceptance.       Plan Discussed with: CRNA  Anesthesia Plan Comments:        Anesthesia Quick Evaluation

## 2023-08-25 NOTE — Anesthesia Procedure Notes (Signed)
Spinal  Patient location during procedure: OR End time: 08/25/2023 10:17 AM Reason for block: surgical anesthesia Staffing Performed: resident/CRNA  Anesthesiologist: Shelton Silvas, MD Resident/CRNA: Orest Dikes, CRNA Performed by: Orest Dikes, CRNA Authorized by: Shelton Silvas, MD   Preanesthetic Checklist Completed: patient identified, IV checked, site marked, risks and benefits discussed, surgical consent, monitors and equipment checked, pre-op evaluation and timeout performed Spinal Block Patient position: sitting Prep: DuraPrep Patient monitoring: heart rate, cardiac monitor, continuous pulse ox and blood pressure Approach: midline Location: L3-4 Injection technique: single-shot Needle Needle type: Pencan  Needle gauge: 24 G Needle length: 10 cm Assessment Sensory level: T4 Events: CSF return Additional Notes IV functioning, monitors applied to pt. Expiration date of kit checked and confirmed to be in date. Sterile prep and drape, hand hygiene and sterile gloves used. Pt was positioned and spine was prepped in sterile fashion. Skin was anesthetized with lidocaine. Free flow of clear CSF obtained prior to injecting local anesthetic into CSF x 1 attempt. Spinal needle aspirated freely following injection. Needle was carefully withdrawn, and pt tolerated procedure well. Loss of motor and sensory on exam post injection. Dr Hart Rochester at bedside for entire placement.

## 2023-08-25 NOTE — Transfer of Care (Signed)
Immediate Anesthesia Transfer of Care Note  Patient: Crystal [name]  Procedure(s) Performed: RIGHT TOTAL HIP ARTHROPLASTY ANTERIOR APPROACH (Right: Hip)  Patient Location: PACU  Anesthesia Type:MAC and Spinal  Level of Consciousness: awake, alert , and oriented  Airway & Oxygen Therapy: Patient Spontanous Breathing and Patient connected to face mask oxygen  Post-op Assessment: Report given to RN and Post -op Vital signs reviewed and stable  Post vital signs: Reviewed and stable  Last Vitals:  Vitals Value Taken Time  BP 91/65 08/25/23 1202  Temp    Pulse 85 08/25/23 1204  Resp 16 08/25/23 1204  SpO2 100 % 08/25/23 1204  Vitals shown include unfiled device data.  Last Pain:  Vitals:   08/25/23 0738  TempSrc: Oral  PainSc:          Complications: No notable events documented.

## 2023-08-25 NOTE — Interval H&P Note (Signed)
History and Physical Interval Note:  08/25/2023 9:35 AM  Crystal [name]  has presented today for surgery, with the diagnosis of RIGHT HIP DEGENERATIVE JOINT DISEASE.  The various methods of treatment have been discussed with the patient and family. After consideration of risks, benefits and other options for treatment, the patient has consented to  Procedure(s): RIGHT TOTAL HIP ARTHROPLASTY ANTERIOR APPROACH (Right) as a surgical intervention.  The patient's history has been reviewed, patient examined, no change in status, stable for surgery.  I have reviewed the patient's chart and labs.  Questions were answered to the patient's satisfaction.     Velna Ochs

## 2023-08-25 NOTE — Op Note (Signed)
PRE-OP DIAGNOSIS:  RIGHT HIP DEGENERATIVE JOINT DISEASE POST-OP DIAGNOSIS:  same PROCEDURE: RIGHT TOTAL HIP ARTHROPLASTY ANTERIOR APPROACH ANESTHESIA:  Spinal and MAC SURGEON:  Marcene Corning MD ASSISTANT:  Elodia Florence PA-C   INDICATIONS FOR PROCEDURE:  The patient is a 72 y.o. female with a long history of a painful hip.  This has persisted despite multiple conservative measures.  The patient has persisted with pain and dysfunction making rest and activity difficult.  A total hip replacement is offered as surgical treatment.  Informed operative consent was obtained after discussion of possible complications including reaction to anesthesia, infection, neurovascular injury, dislocation, DVT, PE, and death.  The importance of the postoperative rehab program to optimize result was stressed with the patient.  SUMMARY OF FINDINGS AND PROCEDURE:  Under the above anesthesia through a anterior approach an the Hana table a right THR was performed.  The patient had severe degenerative change and good bone quality.  We used DePuy components to replace the hip and these were size 5 high offset Actis femur capped with a +5 36mm ceramic hip ball.  On the acetabular side we used a size 52 Gription shell with a  plus 0 neutral polyethylene liner.  We did use a hole eliminator.  Elodia Florence PA-C assisted throughout and was invaluable to the completion of the case in that he helped position and retract while I performed the procedure.  He also closed simultaneously to help minimize OR time.  I used fluoroscopy throughout the case to check position of implants and leg lengths and read all of these views myself.  DESCRIPTION OF PROCEDURE:  The patient was taken to the OR suite where the above anesthetic was applied.  The patient was then positioned on the Hana table supine.  All bony prominences were appropriately padded.  Prep and drape was then performed in normal sterile fashion.  The patient was given kefzol  preoperative antibiotic and an appropriate time out was performed.  We then took an anterior approach to the right hip.  Dissection was taken through adipose to the tensor fascia lata fascia.  This structure was incised longitudinally and we dissected in the intermuscular interval just medial to this muscle.  Cobra retractors were placed superior and inferior to the femoral neck superficial to the capsule.  A capsular incision was then made and the retractors were placed along the femoral neck.  Xray was brought in to get a good level for the femoral neck cut which was made with an oscillating saw and osteotome.  The femoral head was removed with a corkscrew.  The acetabulum was exposed and some labral tissues were excised. Reaming was taken to the inside wall of the pelvis and sequentially up to 1 mm smaller than the actual component.  A trial of components was done and then the aforementioned acetabular shell was placed in appropriate tilt and anteversion confirmed by fluoroscopy. The liner was placed along with the hole eliminator and attention was turned to the femur.  The leg was brought down and over into adduction and the elevator bar was used to raise the femur up gently in the wound.  The piriformis was released with care taken to preserve the obturator internus attachment and all of the posterior capsule. The femur was reamed and then broached to the appropriate size.  A trial reduction was done and the aforementioned head and neck assembly gave [place] the best stability in extension with external rotation.  Leg lengths were felt to be  about equal by fluoroscopic exam.  The trial components were removed and the wound irrigated.  We then placed the femoral component in appropriate anteversion.  The head was applied to a dry stem neck and the hip again reduced.  It was again stable in the aforementioned position.  The would was irrigated again followed by re-approximation of anterior capsule with ethibond  suture. Tensor fascia was repaired with V-loc suture  followed by deep closure with #O and #2 undyed vicryl.  Skin was closed with subQ stitch and steristrips followed by a sterile dressing.  EBL and IOF can be obtained from anesthesia records.  DISPOSITION:  The patient was taken to PACU in stable condition to potentially go home same day depending on ability to walk and tolerate liquids.

## 2023-08-25 NOTE — Evaluation (Signed)
Physical Therapy Evaluation Patient Details Name: Crystal [name] MRN: [id number] DOB: 21-Aug-1951 Today's Date: 08/25/2023  History of Present Illness  Patient is 72 y.o. female presents to therapy s/p R THA, anterior approach on 08/25/2023 due to failure of conservative measures. Pt PMH includes but is not limited to:  osteoporosis, HTN, GERD, depression, COPD, anxiety, tobacco abuse, OSA, IBS,  L THA, AA (09/11/20) and L TKA (02/17/2023).  Clinical Impression      Crystal [name] is a 72 y.o. female POD 0 s/p T THA, AA. Patient reports mod I with SPC for mobility at baseline. Patient is now limited by functional impairments (see PT problem list below) and requires min A for bed mobility and CGA and cues for transfers. Patient was able to ambulate 45, 20 and 25 feet with RW and CGA level of assist. Patient instructed in exercise to facilitate ROM and circulation to manage edema reviewed and HO provided. Pt, family and caregiver ed provided on safe step navigation (3 with CGA and cues), fall risk prevention, use of RW, pain goal, CP/ice and pain management, car transfers with all parties demonstrating verbal understanding. Pt reported feeling light headed with commode transfer, once outside of bathroom pt reported feeling nauseated and some tightness in her chest and "gut", nursing staff made aware. Pt seated in recliner and vital signs assessed (see below). Pt is motivated to return home same day as scheduled and reported mild dizziness and able to complete PT evaluation however based on Bp findings PT recommends, pt and family agreeable, for pt to remain overnight. Pt left seated in recliner, all needs in place, son present. Patient will benefit from continued skilled PT interventions to address impairments and progress towards PLOF. Acute PT will follow to progress mobility and stair training in preparation for safe discharge home with family and social support with OPPT services scheduled for  11/8.  Bp in sitting s/p reports of angina and nausea 123/62 Bp seated in recliner with pt indicating sensation and light headedness and nausea had subsided 127/79 Bp with immediate standing 105/67 Bp s/p 3 min standing 86/52     If plan is discharge home, recommend the following: A little help with walking and/or transfers;A little help with bathing/dressing/bathroom;Assistance with cooking/housework;Assist for transportation;Help with stairs or ramp for entrance   Can travel by private vehicle        Equipment Recommendations None recommended by PT  Recommendations for Other Services       Functional Status Assessment Patient has had a recent decline in their functional status and demonstrates the ability to make significant improvements in function in a reasonable and predictable amount of time.     Precautions / Restrictions Precautions Precautions: Fall Restrictions Weight Bearing Restrictions: No      Mobility  Bed Mobility Overal bed mobility: Needs Assistance Bed Mobility: Supine to Sit     Supine to sit: Min assist, Used rails, HOB elevated     General bed mobility comments: cues    Transfers Overall transfer level: Needs assistance Equipment used: Rolling walker (2 wheels) Transfers: Sit to/from Stand Sit to Stand: Contact guard assist           General transfer comment: min cues for safety with RW for bed, commode and recliner transfers    Ambulation/Gait Ambulation/Gait assistance: Contact guard assist Gait Distance (Feet): 25 Feet Assistive device: Rolling walker (2 wheels) Gait Pattern/deviations: Step-to pattern, Antalgic, Trunk flexed       General Gait Details: decreased  Stairs Stairs: Yes   Stair Management: Two rails Number of Stairs: 3 General stair comments: step navigation with B handrails and then pt able to transition to one handrail and PT acting HHA/SPC  Wheelchair Mobility     Tilt Bed    Modified Rankin (Stroke  Patients Only)       Balance Overall balance assessment: Needs assistance Sitting-balance support: Feet supported Sitting balance-Leahy Scale: Good     Standing balance support: Bilateral upper extremity supported, During functional activity, Reliant on assistive device for balance Standing balance-Leahy Scale: Fair Standing balance comment: static standing no UE support                             Pertinent Vitals/Pain Pain Assessment Pain Assessment: 0-10 Pain Score: 7  Pain Location: back and R hip Pain Descriptors / Indicators: Aching, Discomfort, Grimacing, Operative site guarding, Spasm Pain Intervention(s): Limited activity within patient's tolerance, Monitored during session, Repositioned, Premedicated before session, Ice applied    Home Living Family/patient expects to be discharged to:: Private residence Living Arrangements: Alone Available Help at Discharge: Friend(s) Type of Home: House Home Access: Stairs to enter Entrance Stairs-Rails:  (doorframe for support) Entrance Stairs-Number of Steps: 2+1   Home Layout: Able to live on main level with bedroom/bathroom Home Equipment: BSC/3in1;Rolling Walker (2 wheels);Shower seat;Cane - single point      Prior Function Prior Level of Function : Independent/Modified Independent             Mobility Comments: mod I with use of SPC for all ADLs, self care tasks, IADLs, drivng       Extremity/Trunk Assessment        Lower Extremity Assessment Lower Extremity Assessment: RLE deficits/detail RLE Deficits / Details: ankle DF/PF 4/5 RLE Sensation: WNL    Cervical / Trunk Assessment Cervical / Trunk Assessment: Normal  Communication   Communication Communication: No apparent difficulties  Cognition Arousal: Alert Behavior During Therapy: WFL for tasks assessed/performed Overall Cognitive Status: Within Functional Limits for tasks assessed                                           General Comments      Exercises Total Joint Exercises Ankle Circles/Pumps: AROM, Both, 10 reps Quad Sets: AROM, Right, 5 reps Gluteal Sets: AROM, Both, 5 reps Heel Slides: AROM, Right, 5 reps Hip ABduction/ADduction: AROM, Right, 5 reps, Standing Long Arc Quad: AROM, Right, 5 reps Knee Flexion: AROM, Right, 5 reps, Standing Standing Hip Extension: AROM, Right, 5 reps   Assessment/Plan    PT Assessment Patient needs continued PT services  PT Problem List Decreased strength;Decreased range of motion;Decreased activity tolerance;Decreased balance;Decreased mobility;Decreased coordination;Pain       PT Treatment Interventions DME instruction;Gait training;Stair training;Functional mobility training;Therapeutic activities;Therapeutic exercise;Balance training;Neuromuscular re-education;Modalities;Patient/family education    PT Goals (Current goals can be found in the Care Plan section)  Acute Rehab PT Goals Patient Stated Goal: to be able to walk normally and return to water aerobics PT Goal Formulation: With patient Time For Goal Achievement: 09/08/23 Potential to Achieve Goals: Good    Frequency 7X/week     Co-evaluation               AM-PAC PT "6 Clicks" Mobility  Outcome Measure Help needed turning from your back to your side while in a  flat bed without using bedrails?: A Little Help needed moving from lying on your back to sitting on the side of a flat bed without using bedrails?: A Little Help needed moving to and from a bed to a chair (including a wheelchair)?: A Little Help needed standing up from a chair using your arms (e.g., wheelchair or bedside chair)?: A Little Help needed to walk in hospital room?: A Little Help needed climbing 3-5 steps with a railing? : A Little 6 Click Score: 18    End of Session Equipment Utilized During Treatment: Gait belt Activity Tolerance: Treatment limited secondary to medical complications (Comment);Patient limited by  pain (nausea, light headedness and associated angina.) Patient left: in chair;with call bell/phone within reach;with family/visitor present   PT Visit Diagnosis: Unsteadiness on feet (R26.81);Other abnormalities of gait and mobility (R26.89);Muscle weakness (generalized) (M62.81);Difficulty in walking, not elsewhere classified (R26.2);Pain Pain - Right/Left: Right Pain - part of body: Hip;Leg (back)    Time: 1610-9604 PT Time Calculation (min) (ACUTE ONLY): 54 min   Charges:   PT Evaluation $PT Eval Low Complexity: 1 Low PT Treatments $Gait Training: 8-22 mins $Therapeutic Exercise: 8-22 mins $Therapeutic Activity: 8-22 mins PT General Charges $$ ACUTE PT VISIT: 1 Visit         Johnny Bridge, PT Acute Rehab   Jacqualyn Posey 08/25/2023, 5:33 PM

## 2023-08-25 NOTE — Progress Notes (Signed)
   08/25/23 2353  BiPAP/CPAP/SIPAP  $ Non-Invasive Home Ventilator  Initial  BiPAP/CPAP/SIPAP Pt Type Adult  BiPAP/CPAP/SIPAP DREAMSTATIOND  Mask Type Nasal mask  Respiratory Rate 18 breaths/min  FiO2 (%) 21 %  Patient Home Equipment No  Auto Titrate Yes (5-20)

## 2023-08-25 NOTE — Telephone Encounter (Signed)
Called and left message of adjusted times and provider.

## 2023-08-25 NOTE — Anesthesia Postprocedure Evaluation (Signed)
Anesthesia Post Note  Patient: Crystal [name]  Procedure(s) Performed: RIGHT TOTAL HIP ARTHROPLASTY ANTERIOR APPROACH (Right: Hip)     Patient location during evaluation: PACU Anesthesia Type: Spinal Level of consciousness: oriented and awake and alert Pain management: pain level controlled Vital Signs Assessment: post-procedure vital signs reviewed and stable Respiratory status: spontaneous breathing, respiratory function stable and patient connected to nasal cannula oxygen Cardiovascular status: blood pressure returned to baseline and stable Postop Assessment: no headache, no backache and no apparent nausea or vomiting Anesthetic complications: no  No notable events documented.  Last Vitals:  Vitals:   08/25/23 1314 08/25/23 1330  BP: 108/64 97/84  Pulse: 78 78  Resp: 20 18  Temp:    SpO2: 99% 100%    Last Pain:  Vitals:   08/25/23 1345  TempSrc:   PainSc: 3                  Shelton Silvas

## 2023-08-25 NOTE — Discharge Instructions (Signed)

## 2023-08-26 ENCOUNTER — Encounter (HOSPITAL_COMMUNITY): Payer: Self-pay | Admitting: Orthopaedic Surgery

## 2023-08-26 DIAGNOSIS — F32A Depression, unspecified: Secondary | ICD-10-CM | POA: Diagnosis not present

## 2023-08-26 DIAGNOSIS — K219 Gastro-esophageal reflux disease without esophagitis: Secondary | ICD-10-CM | POA: Diagnosis not present

## 2023-08-26 DIAGNOSIS — Z87891 Personal history of nicotine dependence: Secondary | ICD-10-CM | POA: Diagnosis not present

## 2023-08-26 DIAGNOSIS — Z7901 Long term (current) use of anticoagulants: Secondary | ICD-10-CM | POA: Diagnosis not present

## 2023-08-26 DIAGNOSIS — J449 Chronic obstructive pulmonary disease, unspecified: Secondary | ICD-10-CM | POA: Diagnosis not present

## 2023-08-26 DIAGNOSIS — Z79899 Other long term (current) drug therapy: Secondary | ICD-10-CM | POA: Diagnosis not present

## 2023-08-26 DIAGNOSIS — I10 Essential (primary) hypertension: Secondary | ICD-10-CM | POA: Diagnosis not present

## 2023-08-26 DIAGNOSIS — F419 Anxiety disorder, unspecified: Secondary | ICD-10-CM | POA: Diagnosis not present

## 2023-08-26 DIAGNOSIS — M1611 Unilateral primary osteoarthritis, right hip: Secondary | ICD-10-CM | POA: Diagnosis not present

## 2023-08-26 NOTE — Progress Notes (Signed)
Physical Therapy Treatment Patient Details Name: Crystal [name] MRN: [id number] DOB: September 01, 1951 Today's Date: 08/26/2023   History of Present Illness Patient is 72 y.o. female presents to therapy s/p R THA, anterior approach on 08/25/2023 due to failure of conservative measures. Pt PMH includes but is not limited to:  osteoporosis, HTN, GERD, depression, COPD, anxiety, tobacco abuse, OSA, IBS,  L THA, AA (09/11/20) and L TKA (02/17/2023).    PT Comments  Pt ambulated 59' with RW, no loss of balance, distance limited by fatigue. Reviewed THA HEP, pt demonstrates good understanding. Pt stated she doesn't need to review stair training, as she did stairs yesterday and with other prior joint replacement surgeries and understands correct technique. She is ready to DC home from a PT standpoint.     If plan is discharge home, recommend the following: A little help with walking and/or transfers;A little help with bathing/dressing/bathroom;Assistance with cooking/housework;Assist for transportation;Help with stairs or ramp for entrance   Can travel by private vehicle        Equipment Recommendations  None recommended by PT    Recommendations for Other Services       Precautions / Restrictions Precautions Precautions: Fall Restrictions Weight Bearing Restrictions: No     Mobility  Bed Mobility Overal bed mobility: Modified Independent Bed Mobility: Supine to Sit     Supine to sit: Modified independent (Device/Increase time), HOB elevated, Used rails          Transfers Overall transfer level: Modified independent Equipment used: Rolling walker (2 wheels) Transfers: Sit to/from Stand Sit to Stand: Supervision, From elevated surface           General transfer comment: VCs hand placement    Ambulation/Gait Ambulation/Gait assistance: Supervision Gait Distance (Feet): 80 Feet Assistive device: Rolling walker (2 wheels) Gait Pattern/deviations: Step-to pattern, Decreased step  length - right, Decreased step length - left Gait velocity: decr     General Gait Details: no loss of balance   Stairs Stairs:  (pt declined stair training, she stated she understands the correct technique from prior joint replacements)           Wheelchair Mobility     Tilt Bed    Modified Rankin (Stroke Patients Only)       Balance Overall balance assessment: Needs assistance Sitting-balance support: Feet supported Sitting balance-Leahy Scale: Good     Standing balance support: Bilateral upper extremity supported, During functional activity, Reliant on assistive device for balance Standing balance-Leahy Scale: Fair Standing balance comment: static standing no UE support                            Cognition Arousal: Alert Behavior During Therapy: WFL for tasks assessed/performed Overall Cognitive Status: Within Functional Limits for tasks assessed                                          Exercises Total Joint Exercises Ankle Circles/Pumps: AROM, Both, 10 reps, Supine Quad Sets: AROM, Right, 5 reps, Supine, Both Short Arc Quad: AROM, Right, 5 reps, Supine Heel Slides: AROM, Right, 5 reps, Supine Hip ABduction/ADduction: AROM, Right, 5 reps, Supine    General Comments        Pertinent Vitals/Pain Pain Assessment Pain Score: 5  Pain Location: R hip Pain Descriptors / Indicators: Sore Pain Intervention(s): Limited activity within patient's tolerance,  Monitored during session, Premedicated before session, Ice applied    Home Living                          Prior Function            PT Goals (current goals can now be found in the care plan section) Acute Rehab PT Goals Patient Stated Goal: to be able to walk normally and return to water aerobics PT Goal Formulation: With patient Time For Goal Achievement: 09/08/23 Potential to Achieve Goals: Good Progress towards PT goals: Progressing toward goals     Frequency    Min 4X/week      PT Plan      Co-evaluation              AM-PAC PT "6 Clicks" Mobility   Outcome Measure  Help needed turning from your back to your side while in a flat bed without using bedrails?: A Little Help needed moving from lying on your back to sitting on the side of a flat bed without using bedrails?: A Little Help needed moving to and from a bed to a chair (including a wheelchair)?: None Help needed standing up from a chair using your arms (e.g., wheelchair or bedside chair)?: None Help needed to walk in hospital room?: None Help needed climbing 3-5 steps with a railing? : A Little 6 Click Score: 21    End of Session Equipment Utilized During Treatment: Gait belt Activity Tolerance: Patient tolerated treatment well Patient left: in chair;with call bell/phone within reach;with family/visitor present   PT Visit Diagnosis: Unsteadiness on feet (R26.81);Other abnormalities of gait and mobility (R26.89);Muscle weakness (generalized) (M62.81);Difficulty in walking, not elsewhere classified (R26.2);Pain Pain - Right/Left: Right Pain - part of body: Hip;Leg (back)     Time: 1010-1034 PT Time Calculation (min) (ACUTE ONLY): 24 min  Charges:    $Gait Training: 8-22 mins $Therapeutic Exercise: 8-22 mins PT General Charges $$ ACUTE PT VISIT: 1 Visit                    Tamala Ser PT 08/26/2023  Acute Rehabilitation Services  Office [phone]

## 2023-08-26 NOTE — TOC Transition Note (Signed)
Transition of Care [facility]) - CM/SW Discharge Note   Patient Details  Name: Crystal [name] MRN: [id number] Date of Birth: 20-May-1951  [facility] CM/SW Contact:  Amada Jupiter, LCSW Phone Number: 08/26/2023, 10:50 AM   Clinical Narrative:     Met with pt who confirms she has needed DME in the home.  OPPT already arranged with [facility].  No further TOC needs.  Final next level of care: OP Rehab Barriers to Discharge: No Barriers Identified   Patient Goals and CMS Choice      Discharge Placement                         Discharge Plan and Services Additional resources added to the After Visit Summary for                  DME Arranged: N/A DME Agency: NA                  Social Determinants of Health (SDOH) Interventions SDOH Screenings   Food Insecurity: No Food Insecurity (08/25/2023)  Housing: Low Risk  (08/25/2023)  Transportation Needs: No Transportation Needs (08/25/2023)  Utilities: Not At Risk (08/25/2023)  Alcohol Screen: Low Risk  (04/13/2023)  Depression (PHQ2-9): Low Risk  (05/04/2023)  Financial Resource Strain: Low Risk  (04/13/2023)  Physical Activity: Sufficiently Active (04/13/2023)  Social Connections: Socially Isolated (04/13/2023)  Stress: No Stress Concern Present (04/13/2023)  Tobacco Use: Medium Risk (08/25/2023)     Readmission Risk Interventions     No data to display

## 2023-08-26 NOTE — Progress Notes (Signed)
Subjective: 1 Day Post-Op Procedure(s) (LRB): RIGHT TOTAL HIP ARTHROPLASTY ANTERIOR APPROACH (Right)  Activity level:  OOB Diet tolerance:  liquids so far Voiding:  well Patient reports pain as mild.    Objective: Vital signs in last 24 hours: Temp:  [98.2 F (36.8 C)-99.5 F (37.5 C)] 98.9 F (37.2 C) (11/06 1607) Pulse Rate:  [68-87] 87 (11/06 0614) Resp:  [16-20] 17 (11/06 0614) BP: (90-139)/(46-84) 138/75 (11/06 0614) SpO2:  [95 %-100 %] 97 % (11/06 0614) FiO2 (%):  [21 %] 21 % (11/05 2353) Weight:  [85.5 kg] 85.5 kg (11/05 0713)  Labs: No results for input(s): "HGB" in the last 72 hours. No results for input(s): "WBC", "RBC", "HCT", "PLT" in the last 72 hours. No results for input(s): "NA", "K", "CL", "CO2", "BUN", "CREATININE", "GLUCOSE", "CALCIUM" in the last 72 hours. No results for input(s): "LABPT", "INR" in the last 72 hours.  Physical Exam:  Neurologically intact ABD soft Sensation intact distally  Assessment/Plan:  1 Day Post-Op Procedure(s) (LRB): RIGHT TOTAL HIP ARTHROPLASTY ANTERIOR APPROACH (Right) Advance diet Up with therapy D/C IV fluids Discharge home with home health    Velna Ochs 08/26/2023, 7:11 AM

## 2023-08-26 NOTE — Discharge Summary (Signed)
Patient ID: Crystal [name] MRN: [id number] DOB/AGE: Mar 15, 1951 72 y.o.  Admit date: 08/25/2023 Discharge date: 08/26/2023  Admission Diagnoses:  Principal Problem:   H/O total hip arthroplasty, right Active Problems:   Osteoarthritis of right hip   Discharge Diagnoses:  Same  Past Medical History:  Diagnosis Date   ALLERGIC RHINITIS    Allergy    ANXIETY    no medations needed   Arthritis    Asthma    Bronchitis    Cataract    no surgery   COLONIC POLYPS, HX OF    Complication of anesthesia    oxygen sat dropped during endoscopy    COPD    pt unsure of this dx   Cough    DEPRESSION    Dyspnea    Elevated coronary artery calcium score 12/2021   Agatston score 173-all LAD   Fatty liver    GERD    GLUCOSE INTOLERANCE    Hyperlipidemia    HYPERTENSION    IBS    Impaired glucose tolerance    Obstructive sleep apnea 06/25/2018   OSTEOPENIA    Osteopenia    Osteoporosis 07/30/2017   PERIPHERAL EDEMA    Pneumonia    Pneumonia, organism unspecified(486)    Sleep apnea    cpap   TOBACCO USE DISORDER/SMOKER-SMOKING CESSATION DISCUSSED     Surgeries: Procedure(s): RIGHT TOTAL HIP ARTHROPLASTY ANTERIOR APPROACH on 08/25/2023   Consultants:   Discharged Condition: Improved  Hospital Course: Crystal [name] is an 72 y.o. female who was admitted 08/25/2023 for operative treatment ofH/O total hip arthroplasty, right. Patient has severe unremitting pain that affects sleep, daily activities, and work/hobbies. After pre-op clearance the patient was taken to the operating room on 08/25/2023 and underwent  Procedure(s): RIGHT TOTAL HIP ARTHROPLASTY ANTERIOR APPROACH.    Patient was given perioperative antibiotics:  Anti-infectives (From admission, onward)    Start     Dose/Rate Route Frequency Ordered Stop   08/25/23 1825  valACYclovir (VALTREX) tablet 1,000 mg        1,000 mg Oral 2 times daily PRN 08/25/23 1825     08/25/23 1030  vancomycin (VANCOCIN) IVPB 1000  mg/200 mL premix  Status:  Discontinued        1,000 mg 200 mL/hr over 60 Minutes Intravenous  Once 08/25/23 1020 08/25/23 1823   08/25/23 1030  ceFAZolin (ANCEF) IVPB 2g/100 mL premix        2 g 200 mL/hr over 30 Minutes Intravenous  Once 08/25/23 1023 08/25/23 1020        Patient was given sequential compression devices, early ambulation, and chemoprophylaxis to prevent DVT.  Patient benefited maximally from hospital stay and there were no complications.    Recent vital signs: Patient Vitals for the past 24 hrs:  BP Temp Temp src Pulse Resp SpO2  08/26/23 0614 138/75 98.9 F (37.2 C) Oral 87 17 97 %  08/26/23 0129 139/75 98.2 F (36.8 C) Oral 85 16 99 %  08/25/23 2153 131/68 99.2 F (37.3 C) Oral 83 17 95 %  08/25/23 1830 135/70 99.5 F (37.5 C) Oral 77 18 100 %  08/25/23 1500 (!) 108/56 -- -- 68 -- 100 %  08/25/23 1400 (!) 92/46 -- -- 74 -- 100 %  08/25/23 1345 (!) 95/54 -- -- 78 -- 100 %  08/25/23 1330 97/84 -- -- 78 18 100 %  08/25/23 1314 108/64 -- -- 78 20 99 %  08/25/23 1300 (!) 98/55 -- -- 77 18 99 %  08/25/23 1245 (!) 90/51 -- -- 75 18 99 %  08/25/23 1230 (!) 93/56 -- -- 79 16 98 %  08/25/23 1202 91/65 98.6 F (37 C) -- 85 19 100 %     Recent laboratory studies: No results for input(s): "WBC", "HGB", "HCT", "PLT", "NA", "K", "CL", "CO2", "BUN", "CREATININE", "GLUCOSE", "INR", "CALCIUM" in the last 72 hours.  Invalid input(s): "PT", "2"   Discharge Medications:   Allergies as of 08/26/2023       Reactions   Tape    Removed a layer of skin when removed, please use paper tape   Ace Inhibitors Cough   Penicillins Rash        Medication List     TAKE these medications    albuterol 108 (90 Base) MCG/ACT inhaler Commonly known as: Ventolin HFA Inhale 2 puffs into the lungs every 4 (four) hours as needed for wheezing or shortness of breath (coughing fits).   aspirin EC 81 MG tablet Take 1 tablet (81 mg total) by mouth 2 (two) times daily. For 2 weeks  then once a day for 2 weeks after that. What changed:  when to take this additional instructions   atorvastatin 40 MG tablet Commonly known as: LIPITOR TAKE 1 TABLET BY MOUTH EVERY DAY   Breztri Aerosphere 160-9-4.8 MCG/ACT Aero Generic drug: Budeson-Glycopyrrol-Formoterol Inhale 2 puffs into the lungs in the morning and at bedtime. with spacer and rinse mouth afterwards. What changed:  when to take this additional instructions   Calcium Carb-Cholecalciferol 600-800 MG-UNIT Tabs Take 1 tablet by mouth daily.   folic acid 400 MCG tablet Commonly known as: FOLVITE Take 400 mcg by mouth daily.   loratadine 10 MG tablet Commonly known as: CLARITIN Take 10 mg by mouth daily as needed for allergies.   losartan 50 MG tablet Commonly known as: COZAAR TAKE 1 TABLET EVERY DAY (ANNUAL APPOINTMENT DUE IN MAY, MUST SEE MD FOR FUTURE REFILLS)   metoprolol succinate 25 MG 24 hr tablet Commonly known as: TOPROL-XL TAKE 1 TABLET BY MOUTH DAILY. ANNUAL APPT IS DUE W/LABS MUST SEE PROVIDER FOR FUTURE REFILLS   NON FORMULARY Pt uses a cpap nightly   Prolia 60 MG/ML Sosy injection Generic drug: denosumab INJECT 1 SYRINGE UNDER THE SKIN ONCE EVERY 6 MONTHS   tiZANidine 4 MG tablet Commonly known as: Zanaflex Take 1 tablet (4 mg total) by mouth every 6 (six) hours as needed for muscle spasms.   valACYclovir 1000 MG tablet Commonly known as: VALTREX Take 1,000 mg by mouth 2 (two) times daily as needed (fever blisters).   VITAMIN D PO Take 2,000 Units by mouth daily.               Durable Medical Equipment  (From admission, onward)           Start     Ordered   08/25/23 1826  DME Walker rolling  Once       Question:  Patient needs a walker to treat with the following condition  Answer:  Status post right hip replacement   08/25/23 1825   08/25/23 1826  DME 3 n 1  Once        08/25/23 1825              Discharge Care Instructions  (From admission, onward)            Start     Ordered   08/26/23 0000  Weight bearing as tolerated  08/26/23 0840   08/25/23 0000  Weight bearing as tolerated        08/25/23 1152            Diagnostic Studies: DG HIP UNILAT WITH PELVIS 1V RIGHT  Result Date: 08/25/2023 CLINICAL DATA:  Elective surgery. EXAM: DG HIP (WITH OR WITHOUT PELVIS) 1V RIGHT COMPARISON:  None Available. FINDINGS: Two fluoroscopic spot views of the pelvis and right hip obtained in the operating room. Sequential images during hip arthroplasty. Fluoroscopy time 20 seconds. Dose 2.53 mGy. IMPRESSION: Intraoperative fluoroscopy during right hip arthroplasty. Electronically Signed   By: Narda Rutherford M.D.   On: 08/25/2023 12:43   DG C-Arm 1-60 Min-No Report  Result Date: 08/25/2023 Fluoroscopy was utilized by the requesting physician.  No radiographic interpretation.    Disposition: Discharge disposition: 01-Home or Self Care       Discharge Instructions     Call MD / Call 911   Complete by: As directed    If you experience chest pain or shortness of breath, CALL 911 and be transported to the hospital emergency room.  If you develope a fever above 101 F, pus (white drainage) or increased drainage or redness at the wound, or calf pain, call your surgeon's office.   Call MD / Call 911   Complete by: As directed    If you experience chest pain or shortness of breath, CALL 911 and be transported to the hospital emergency room.  If you develope a fever above 101 F, pus (white drainage) or increased drainage or redness at the wound, or calf pain, call your surgeon's office.   Constipation Prevention   Complete by: As directed    Drink plenty of fluids.  Prune juice may be helpful.  You may use a stool softener, such as Colace (over the counter) 100 mg twice a day.  Use MiraLax (over the counter) for constipation as needed.   Constipation Prevention   Complete by: As directed    Drink plenty of fluids.  Prune juice may be  helpful.  You may use a stool softener, such as Colace (over the counter) 100 mg twice a day.  Use MiraLax (over the counter) for constipation as needed.   Diet - low sodium heart healthy   Complete by: As directed    Driving restrictions   Complete by: As directed    No driving for 2 weeks   Driving restrictions   Complete by: As directed    No driving for 2 weeks   Increase activity slowly as tolerated   Complete by: As directed    Increase activity slowly as tolerated   Complete by: As directed    Patient may shower   Complete by: As directed    You may shower without a dressing once there is no drainage.  Do not wash over the wound.  If drainage remains, cover wound with plastic wrap and then shower.   Patient may shower   Complete by: As directed    You may shower without a dressing once there is no drainage.  Do not wash over the wound.  If drainage remains, cover wound with plastic wrap and then shower.   Post-operative opioid taper instructions:   Complete by: As directed    POST-OPERATIVE OPIOID TAPER INSTRUCTIONS: It is important to wean off of your opioid medication as soon as possible. If you do not need pain medication after your surgery it is ok to stop day one. Opioids include: Codeine,  Hydrocodone(Norco, Vicodin), Oxycodone(Percocet, oxycontin) and hydromorphone amongst others.  Long term and even short term use of opiods can cause: Increased pain response Dependence Constipation Depression Respiratory depression And more.  Withdrawal symptoms can include Flu like symptoms Nausea, vomiting And more Techniques to manage these symptoms Hydrate well Eat regular healthy meals Stay active Use relaxation techniques(deep breathing, meditating, yoga) Do Not substitute Alcohol to help with tapering If you have been on opioids for less than two weeks and do not have pain than it is ok to stop all together.  Plan to wean off of opioids This plan should start within  one week post op of your joint replacement. Maintain the same interval or time between taking each dose and first decrease the dose.  Cut the total daily intake of opioids by one tablet each day Next start to increase the time between doses. The last dose that should be eliminated is the evening dose.      Post-operative opioid taper instructions:   Complete by: As directed    POST-OPERATIVE OPIOID TAPER INSTRUCTIONS: It is important to wean off of your opioid medication as soon as possible. If you do not need pain medication after your surgery it is ok to stop day one. Opioids include: Codeine, Hydrocodone(Norco, Vicodin), Oxycodone(Percocet, oxycontin) and hydromorphone amongst others.  Long term and even short term use of opiods can cause: Increased pain response Dependence Constipation Depression Respiratory depression And more.  Withdrawal symptoms can include Flu like symptoms Nausea, vomiting And more Techniques to manage these symptoms Hydrate well Eat regular healthy meals Stay active Use relaxation techniques(deep breathing, meditating, yoga) Do Not substitute Alcohol to help with tapering If you have been on opioids for less than two weeks and do not have pain than it is ok to stop all together.  Plan to wean off of opioids This plan should start within one week post op of your joint replacement. Maintain the same interval or time between taking each dose and first decrease the dose.  Cut the total daily intake of opioids by one tablet each day Next start to increase the time between doses. The last dose that should be eliminated is the evening dose.      Weight bearing as tolerated   Complete by: As directed    Weight bearing as tolerated   Complete by: As directed         Follow-up Information     Marcene Corning, MD. Go on 09/04/2023.   Specialty: Orthopedic Surgery Why: your appointment is scheduled for 10:00 Contact information: [address] [place] [address] [phone]         [facility] Go on 08/28/2023.   Why: Your outpatient physical therapy is scheduled for 2:20. Please arrive at 2:00 to complete your paperwork Contact information: Physical Therapy [address] [place] [address] [phone]                  Signed: [name] Burn 08/26/2023, 8:40 AM

## 2023-08-26 NOTE — Progress Notes (Signed)
Patient remains alert, oriented x4. Ambulatory with walker. Pulses 2+ bilaterally radial/dorsalis pedis. Normal nero exam. Discharge instructions reviewed/ Questions, concerns denied at this time.

## 2023-08-28 DIAGNOSIS — M25651 Stiffness of right hip, not elsewhere classified: Secondary | ICD-10-CM | POA: Diagnosis not present

## 2023-08-28 DIAGNOSIS — Z96641 Presence of right artificial hip joint: Secondary | ICD-10-CM | POA: Diagnosis not present

## 2023-08-31 DIAGNOSIS — M25651 Stiffness of right hip, not elsewhere classified: Secondary | ICD-10-CM | POA: Diagnosis not present

## 2023-08-31 DIAGNOSIS — Z96641 Presence of right artificial hip joint: Secondary | ICD-10-CM | POA: Diagnosis not present

## 2023-09-02 DIAGNOSIS — M25651 Stiffness of right hip, not elsewhere classified: Secondary | ICD-10-CM | POA: Diagnosis not present

## 2023-09-02 DIAGNOSIS — Z96641 Presence of right artificial hip joint: Secondary | ICD-10-CM | POA: Diagnosis not present

## 2023-09-04 DIAGNOSIS — M25651 Stiffness of right hip, not elsewhere classified: Secondary | ICD-10-CM | POA: Diagnosis not present

## 2023-09-04 DIAGNOSIS — Z96641 Presence of right artificial hip joint: Secondary | ICD-10-CM | POA: Diagnosis not present

## 2023-09-07 DIAGNOSIS — Z96641 Presence of right artificial hip joint: Secondary | ICD-10-CM | POA: Diagnosis not present

## 2023-09-07 DIAGNOSIS — M25651 Stiffness of right hip, not elsewhere classified: Secondary | ICD-10-CM | POA: Diagnosis not present

## 2023-09-09 DIAGNOSIS — M25651 Stiffness of right hip, not elsewhere classified: Secondary | ICD-10-CM | POA: Diagnosis not present

## 2023-09-09 DIAGNOSIS — Z96641 Presence of right artificial hip joint: Secondary | ICD-10-CM | POA: Diagnosis not present

## 2023-09-11 DIAGNOSIS — M25651 Stiffness of right hip, not elsewhere classified: Secondary | ICD-10-CM | POA: Diagnosis not present

## 2023-09-11 DIAGNOSIS — Z96641 Presence of right artificial hip joint: Secondary | ICD-10-CM | POA: Diagnosis not present

## 2023-09-14 DIAGNOSIS — Z96641 Presence of right artificial hip joint: Secondary | ICD-10-CM | POA: Diagnosis not present

## 2023-09-14 DIAGNOSIS — M25651 Stiffness of right hip, not elsewhere classified: Secondary | ICD-10-CM | POA: Diagnosis not present

## 2023-09-16 DIAGNOSIS — Z96641 Presence of right artificial hip joint: Secondary | ICD-10-CM | POA: Diagnosis not present

## 2023-09-16 DIAGNOSIS — M25651 Stiffness of right hip, not elsewhere classified: Secondary | ICD-10-CM | POA: Diagnosis not present

## 2023-09-21 DIAGNOSIS — M25651 Stiffness of right hip, not elsewhere classified: Secondary | ICD-10-CM | POA: Diagnosis not present

## 2023-09-21 DIAGNOSIS — Z96641 Presence of right artificial hip joint: Secondary | ICD-10-CM | POA: Diagnosis not present

## 2023-09-25 DIAGNOSIS — Z96641 Presence of right artificial hip joint: Secondary | ICD-10-CM | POA: Diagnosis not present

## 2023-09-25 DIAGNOSIS — M25651 Stiffness of right hip, not elsewhere classified: Secondary | ICD-10-CM | POA: Diagnosis not present

## 2023-09-30 DIAGNOSIS — M25651 Stiffness of right hip, not elsewhere classified: Secondary | ICD-10-CM | POA: Diagnosis not present

## 2023-09-30 DIAGNOSIS — Z96641 Presence of right artificial hip joint: Secondary | ICD-10-CM | POA: Diagnosis not present

## 2023-10-02 DIAGNOSIS — Z96641 Presence of right artificial hip joint: Secondary | ICD-10-CM | POA: Diagnosis not present

## 2023-10-02 DIAGNOSIS — M25651 Stiffness of right hip, not elsewhere classified: Secondary | ICD-10-CM | POA: Diagnosis not present

## 2023-10-05 ENCOUNTER — Encounter: Payer: Self-pay | Admitting: Internal Medicine

## 2023-10-05 NOTE — Telephone Encounter (Signed)
 Care team updated and letter sent for eye exam notes.

## 2023-10-06 DIAGNOSIS — M25651 Stiffness of right hip, not elsewhere classified: Secondary | ICD-10-CM | POA: Diagnosis not present

## 2023-10-06 DIAGNOSIS — Z96641 Presence of right artificial hip joint: Secondary | ICD-10-CM | POA: Diagnosis not present

## 2023-10-08 DIAGNOSIS — Z96641 Presence of right artificial hip joint: Secondary | ICD-10-CM | POA: Diagnosis not present

## 2023-10-08 DIAGNOSIS — M25651 Stiffness of right hip, not elsewhere classified: Secondary | ICD-10-CM | POA: Diagnosis not present

## 2023-10-13 DIAGNOSIS — M25651 Stiffness of right hip, not elsewhere classified: Secondary | ICD-10-CM | POA: Diagnosis not present

## 2023-10-13 DIAGNOSIS — Z96641 Presence of right artificial hip joint: Secondary | ICD-10-CM | POA: Diagnosis not present

## 2023-10-26 DIAGNOSIS — M25651 Stiffness of right hip, not elsewhere classified: Secondary | ICD-10-CM | POA: Diagnosis not present

## 2023-10-26 DIAGNOSIS — Z96641 Presence of right artificial hip joint: Secondary | ICD-10-CM | POA: Diagnosis not present

## 2023-10-28 DIAGNOSIS — Z96641 Presence of right artificial hip joint: Secondary | ICD-10-CM | POA: Diagnosis not present

## 2023-10-28 DIAGNOSIS — M25651 Stiffness of right hip, not elsewhere classified: Secondary | ICD-10-CM | POA: Diagnosis not present

## 2023-10-29 ENCOUNTER — Telehealth: Payer: Self-pay | Admitting: Oncology

## 2023-10-30 ENCOUNTER — Inpatient Hospital Stay: Payer: Medicare HMO | Admitting: Oncology

## 2023-10-30 ENCOUNTER — Other Ambulatory Visit: Payer: Self-pay

## 2023-10-30 ENCOUNTER — Inpatient Hospital Stay: Payer: Medicare HMO

## 2023-10-30 DIAGNOSIS — D126 Benign neoplasm of colon, unspecified: Secondary | ICD-10-CM

## 2023-10-30 DIAGNOSIS — D7589 Other specified diseases of blood and blood-forming organs: Secondary | ICD-10-CM

## 2023-11-03 DIAGNOSIS — M25651 Stiffness of right hip, not elsewhere classified: Secondary | ICD-10-CM | POA: Diagnosis not present

## 2023-11-03 DIAGNOSIS — Z96641 Presence of right artificial hip joint: Secondary | ICD-10-CM | POA: Diagnosis not present

## 2023-11-05 DIAGNOSIS — M25651 Stiffness of right hip, not elsewhere classified: Secondary | ICD-10-CM | POA: Diagnosis not present

## 2023-11-05 DIAGNOSIS — Z96641 Presence of right artificial hip joint: Secondary | ICD-10-CM | POA: Diagnosis not present

## 2023-11-10 DIAGNOSIS — M25651 Stiffness of right hip, not elsewhere classified: Secondary | ICD-10-CM | POA: Diagnosis not present

## 2023-11-10 DIAGNOSIS — Z96641 Presence of right artificial hip joint: Secondary | ICD-10-CM | POA: Diagnosis not present

## 2023-11-17 DIAGNOSIS — Z96641 Presence of right artificial hip joint: Secondary | ICD-10-CM | POA: Diagnosis not present

## 2023-11-17 DIAGNOSIS — M25651 Stiffness of right hip, not elsewhere classified: Secondary | ICD-10-CM | POA: Diagnosis not present

## 2023-11-23 DIAGNOSIS — M1611 Unilateral primary osteoarthritis, right hip: Secondary | ICD-10-CM | POA: Diagnosis not present

## 2023-11-24 DIAGNOSIS — M25651 Stiffness of right hip, not elsewhere classified: Secondary | ICD-10-CM | POA: Diagnosis not present

## 2023-11-24 DIAGNOSIS — Z96641 Presence of right artificial hip joint: Secondary | ICD-10-CM | POA: Diagnosis not present

## 2023-11-27 DIAGNOSIS — Z96641 Presence of right artificial hip joint: Secondary | ICD-10-CM | POA: Diagnosis not present

## 2023-11-27 DIAGNOSIS — M25651 Stiffness of right hip, not elsewhere classified: Secondary | ICD-10-CM | POA: Diagnosis not present

## 2023-12-07 ENCOUNTER — Other Ambulatory Visit: Payer: Self-pay

## 2023-12-07 ENCOUNTER — Other Ambulatory Visit: Payer: Self-pay | Admitting: Internal Medicine

## 2023-12-09 ENCOUNTER — Ambulatory Visit: Payer: Medicare HMO | Admitting: Allergy

## 2023-12-13 NOTE — Progress Notes (Unsigned)
 Follow Up Note  RE: Crystal [name] MRN: [id number] DOB: Dec 08, 1950 Date of Office Visit: 12/14/2023  Referring provider: Corwin Levins, MD Primary care provider: Corwin Levins, MD  Chief Complaint: No chief complaint on file.  History of Present Illness: I had the pleasure of seeing Crystal [name] for a follow up visit at the [facility] on 12/13/2023. She is a 73 y.o. female, who is being followed for COPD with asthma and allergic rhinitis. Her previous allergy office visit was on 06/08/2023 with Dr. Selena Batten. Today is a regular follow up visit.  Discussed the use of AI scribe software for clinical note transcription with the patient, who gave verbal consent to proceed.  History of Present Illness            ***  Assessment and Plan: Cache is a 73 y.o. female with: COPD with asthma Past history - Was doing well until June and required oral prednisone and IM steroid. Breathing is not back to baseline. Quit smoking.  2023 spirometry showed 10% improvement in FEV1 post bronchodilator treatment. Clinically feeling improved. 2023 CT chest done. Interim history - controlled. Today's spirometry was normal. Daily controller medication(s):  Continue Breztri 2 puffs once day with spacer and rinse mouth afterwards. During respiratory infections/flares:  Increase Breztri to 2 puffs twice a day for 1-2 weeks until your breathing symptoms return to baseline.  Pretreat with albuterol 2 puffs. May use albuterol rescue inhaler 2 puffs every 4 to 6 hours as needed for shortness of breath, chest tightness, coughing, and wheezing. May use albuterol rescue inhaler 2 puffs 5 to 15 minutes prior to strenuous physical activities. Monitor frequency of use.   Allergic rhinitis due to dust mite Seasonal allergic rhinitis due to pollen Past history - 2019 skin testing was positive to dust mites, grass, trees and borderline to mouse. [name] daily used caused too much dryness.   Continue environmental control measures.  May use over the counter antihistamines such as Zyrtec (cetirizine), Claritin (loratadine), Allegra (fexofenadine), or Xyzal (levocetirizine) daily as needed. Nasal saline spray (i.e., Simply Saline) or nasal saline lavage (i.e., NeilMed) is recommended as needed and prior to medicated nasal sprays. Assessment and Plan              No follow-ups on file.  No orders of the defined types were placed in this encounter.  Lab Orders  No laboratory test(s) ordered today    Diagnostics: Spirometry:  Tracings reviewed. Her effort: {Blank single:19197::"Good reproducible efforts.","It was hard to get consistent efforts and there is a question as to whether this reflects a maximal maneuver.","Poor effort, data can not be interpreted."} FVC: ***L FEV1: ***L, ***% predicted FEV1/FVC ratio: ***% Interpretation: {Blank single:19197::"Spirometry consistent with mild obstructive disease","Spirometry consistent with moderate obstructive disease","Spirometry consistent with severe obstructive disease","Spirometry consistent with possible restrictive disease","Spirometry consistent with mixed obstructive and restrictive disease","Spirometry uninterpretable due to technique","Spirometry consistent with normal pattern","No overt abnormalities noted given today's efforts"}.  Please see scanned spirometry results for details.  Skin Testing: {Blank single:19197::"Select foods","Environmental allergy panel","Environmental allergy panel and select foods","Food allergy panel","None","Deferred due to recent antihistamines use"}. *** Results discussed with patient/family.   Medication List:  Current Outpatient Medications  Medication Sig Dispense Refill  . albuterol (VENTOLIN HFA) 108 (90 Base) MCG/ACT inhaler Inhale 2 puffs into the lungs every 4 (four) hours as needed for wheezing or shortness of breath (coughing fits). 18 g 1  . aspirin EC 81 MG tablet Take 1  tablet (81 mg total) by mouth 2 (two) times daily. For 2 weeks then once a day for 2 weeks after that. (Patient taking differently: Take 81 mg by mouth daily.) 45 tablet 0  . atorvastatin (LIPITOR) 40 MG tablet TAKE 1 TABLET BY MOUTH EVERY DAY 90 tablet 3  . Budeson-Glycopyrrol-Formoterol (BREZTRI AEROSPHERE) 160-9-4.8 MCG/ACT AERO Inhale 2 puffs into the lungs in the morning and at bedtime. with spacer and rinse mouth afterwards. (Patient taking differently: Inhale 2 puffs into the lungs daily.) 10.7 g 5  . Calcium Carb-Cholecalciferol 600-800 MG-UNIT TABS Take 1 tablet by mouth daily.    . Cholecalciferol (VITAMIN D PO) Take 2,000 Units by mouth daily.     Marland Kitchen denosumab (PROLIA) 60 MG/ML SOSY injection INJECT 1 SYRINGE UNDER THE SKIN ONCE EVERY 6 MONTHS 180 mL 0  . folic acid (FOLVITE) 400 MCG tablet Take 400 mcg by mouth daily.    Marland Kitchen loratadine (CLARITIN) 10 MG tablet Take 10 mg by mouth daily as needed for allergies.    Marland Kitchen losartan (COZAAR) 50 MG tablet TAKE 1 TABLET EVERY DAY (ANNUAL APPOINTMENT DUE IN MAY, MUST SEE MD FOR FUTURE REFILLS) 90 tablet 3  . metoprolol succinate (TOPROL-XL) 25 MG 24 hr tablet TAKE 1 TABLET BY MOUTH DAILY. ANNUAL APPT IS DUE W/LABS MUST SEE PROVIDER FOR FUTURE REFILLS 90 tablet 3  . NON FORMULARY Pt uses a cpap nightly    . tiZANidine (ZANAFLEX) 4 MG tablet Take 1 tablet (4 mg total) by mouth every 6 (six) hours as needed for muscle spasms. 40 tablet 1  . valACYclovir (VALTREX) 1000 MG tablet Take 1,000 mg by mouth 2 (two) times daily as needed (fever blisters).     No current facility-administered medications for this visit.   Allergies: Allergies  Allergen Reactions  . Tape     Removed a layer of skin when removed, please use paper tape  . Ace Inhibitors Cough  . Penicillins Rash   I reviewed her past medical history, social history, family history, and environmental history and no significant changes have been reported from her previous visit.  Review of  Systems  Constitutional:  Negative for appetite change, chills, fever and unexpected weight change.  HENT:  Negative for congestion and rhinorrhea.   Eyes:  Negative for itching.  Respiratory:  Negative for cough, chest tightness, shortness of breath and wheezing.   Gastrointestinal:  Negative for abdominal pain.  Skin:  Negative for rash.  Allergic/Immunologic: Positive for environmental allergies.  Neurological:  Negative for headaches.   Objective: There were no vitals taken for this visit. There is no height or weight on file to calculate BMI. Physical Exam Vitals and nursing note reviewed.  Constitutional:      Appearance: Normal appearance. She is well-developed.  HENT:     Head: Normocephalic and atraumatic.     Right Ear: External ear normal.     Left Ear: External ear normal.     Nose: Nose normal.     Mouth/Throat:     Mouth: Mucous membranes are moist.     Pharynx: Oropharynx is clear.  Eyes:     Conjunctiva/sclera: Conjunctivae normal.  Cardiovascular:     Rate and Rhythm: Normal rate and regular rhythm.     Heart sounds: Normal heart sounds. No murmur heard. Pulmonary:     Effort: Pulmonary effort is normal.     Breath sounds: Normal breath sounds. No wheezing, rhonchi or rales.  Musculoskeletal:     Cervical back: Neck  supple.  Skin:    General: Skin is warm.     Findings: No rash.  Neurological:     Mental Status: She is alert and oriented to person, place, and time.  Psychiatric:        Behavior: Behavior normal.  Previous notes and tests were reviewed. The plan was reviewed with the patient/family, and all questions/concerned were addressed.  It was my pleasure to see Karrissa today and participate in her care. Please feel free to contact me with any questions or concerns.  Sincerely,  Wyline Mood, DO Allergy & Immunology  [facility] office: [phone] [facility] office: [phone]

## 2023-12-14 ENCOUNTER — Other Ambulatory Visit: Payer: Self-pay

## 2023-12-14 ENCOUNTER — Encounter: Payer: Self-pay | Admitting: Allergy

## 2023-12-14 ENCOUNTER — Ambulatory Visit (INDEPENDENT_AMBULATORY_CARE_PROVIDER_SITE_OTHER): Payer: Medicare HMO | Admitting: Allergy

## 2023-12-14 VITALS — BP 138/80 | HR 105 | Temp 98.6°F | Resp 12

## 2023-12-14 DIAGNOSIS — J301 Allergic rhinitis due to pollen: Secondary | ICD-10-CM

## 2023-12-14 DIAGNOSIS — J4489 Other specified chronic obstructive pulmonary disease: Secondary | ICD-10-CM

## 2023-12-14 DIAGNOSIS — R42 Dizziness and giddiness: Secondary | ICD-10-CM | POA: Diagnosis not present

## 2023-12-14 DIAGNOSIS — J3089 Other allergic rhinitis: Secondary | ICD-10-CM

## 2023-12-14 MED ORDER — BREZTRI AEROSPHERE 160-9-4.8 MCG/ACT IN AERO: 2.0000 | INHALATION_SPRAY | Freq: Two times a day (BID) | RESPIRATORY_TRACT | 5 refills | Status: DC

## 2023-12-14 MED ORDER — ALBUTEROL SULFATE HFA 108 (90 BASE) MCG/ACT IN AERS: 2.0000 | INHALATION_SPRAY | RESPIRATORY_TRACT | 1 refills | Status: DC | PRN

## 2023-12-14 NOTE — Patient Instructions (Addendum)
 Breathing Today's breathing test was normal.  Daily controller medication(s):  Continue Breztri 2 puffs once day with spacer and rinse mouth afterwards. Samples given.  During respiratory infections/flares:  Increase Breztri to 2 puffs twice a day for 1-2 weeks until your breathing symptoms return to baseline.  Pretreat with albuterol 2 puffs. May use albuterol rescue inhaler 2 puffs every 4 to 6 hours as needed for shortness of breath, chest tightness, coughing, and wheezing. May use albuterol rescue inhaler 2 puffs 5 to 15 minutes prior to strenuous physical activities. Monitor frequency of use - if you need to use it more than twice per week on a consistent basis let us know.  Breathing control goals:  Full participation in all desired activities (may need albuterol before activity) Albuterol use two times or less a week on average (not counting use with activity) Cough interfering with sleep two times or less a month Oral steroids no more than once a year No hospitalizations   Allergic rhinitis 2019 skin testing positive to dust mites, grass, trees and borderline to mouse. Continue environmental control measures.  May use over the counter antihistamines such as Zyrtec (cetirizine), Claritin (loratadine), Allegra (fexofenadine), or Xyzal (levocetirizine) daily as needed. Nasal saline spray (i.e., Simply Saline) or nasal saline lavage (i.e., NeilMed) is recommended as needed and prior to medicated nasal sprays.  Vertigo Use Nasacort (triamcinolone) or Nasonex or Flonase nasal spray 1 spray per nostril twice a day as needed for about 1-2 weeks and see if it helps. If no improvement, please follow up with your PCP regarding this.   Follow up in 6 months or sooner if needed.

## 2023-12-16 ENCOUNTER — Encounter: Payer: Self-pay | Admitting: Internal Medicine

## 2023-12-16 DIAGNOSIS — Z1231 Encounter for screening mammogram for malignant neoplasm of breast: Secondary | ICD-10-CM | POA: Diagnosis not present

## 2023-12-16 LAB — HM MAMMOGRAPHY

## 2023-12-17 ENCOUNTER — Ambulatory Visit: Payer: Medicare HMO | Admitting: Internal Medicine

## 2023-12-24 ENCOUNTER — Encounter: Payer: Self-pay | Admitting: Internal Medicine

## 2023-12-24 ENCOUNTER — Ambulatory Visit: Admitting: Internal Medicine

## 2023-12-24 ENCOUNTER — Other Ambulatory Visit: Payer: Self-pay

## 2023-12-24 VITALS — BP 120/82 | HR 65 | Temp 98.0°F | Ht 63.0 in | Wt 195.0 lb

## 2023-12-24 DIAGNOSIS — E538 Deficiency of other specified B group vitamins: Secondary | ICD-10-CM

## 2023-12-24 DIAGNOSIS — R7302 Impaired glucose tolerance (oral): Secondary | ICD-10-CM

## 2023-12-24 DIAGNOSIS — Z0001 Encounter for general adult medical examination with abnormal findings: Secondary | ICD-10-CM

## 2023-12-24 DIAGNOSIS — E559 Vitamin D deficiency, unspecified: Secondary | ICD-10-CM

## 2023-12-24 DIAGNOSIS — E785 Hyperlipidemia, unspecified: Secondary | ICD-10-CM

## 2023-12-24 DIAGNOSIS — Z0000 Encounter for general adult medical examination without abnormal findings: Secondary | ICD-10-CM | POA: Diagnosis not present

## 2023-12-24 DIAGNOSIS — J449 Chronic obstructive pulmonary disease, unspecified: Secondary | ICD-10-CM | POA: Diagnosis not present

## 2023-12-24 DIAGNOSIS — M810 Age-related osteoporosis without current pathological fracture: Secondary | ICD-10-CM

## 2023-12-24 DIAGNOSIS — I10 Essential (primary) hypertension: Secondary | ICD-10-CM

## 2023-12-24 LAB — CBC WITH DIFFERENTIAL/PLATELET
Basophils Absolute: 0.1 10*3/uL (ref 0.0–0.1)
Basophils Relative: 0.9 % (ref 0.0–3.0)
Eosinophils Absolute: 0.4 10*3/uL (ref 0.0–0.7)
Eosinophils Relative: 5.8 % — ABNORMAL HIGH (ref 0.0–5.0)
HCT: 40.8 % (ref 36.0–46.0)
Hemoglobin: 13.6 g/dL (ref 12.0–15.0)
Lymphocytes Relative: 29.1 % (ref 12.0–46.0)
Lymphs Abs: 1.8 10*3/uL (ref 0.7–4.0)
MCHC: 33.4 g/dL (ref 30.0–36.0)
MCV: 92.5 fl (ref 78.0–100.0)
Monocytes Absolute: 0.7 10*3/uL (ref 0.1–1.0)
Monocytes Relative: 11.5 % (ref 3.0–12.0)
Neutro Abs: 3.3 10*3/uL (ref 1.4–7.7)
Neutrophils Relative %: 52.7 % (ref 43.0–77.0)
Platelets: 269 10*3/uL (ref 150.0–400.0)
RBC: 4.42 Mil/uL (ref 3.87–5.11)
RDW: 15.3 % (ref 11.5–15.5)
WBC: 6.2 10*3/uL (ref 4.0–10.5)

## 2023-12-24 LAB — BASIC METABOLIC PANEL
BUN: 16 mg/dL (ref 6–23)
CO2: 29 meq/L (ref 19–32)
Calcium: 10 mg/dL (ref 8.4–10.5)
Chloride: 101 meq/L (ref 96–112)
Creatinine, Ser: 0.64 mg/dL (ref 0.40–1.20)
GFR: 87.52 mL/min (ref >60.00)
Glucose, Bld: 105 mg/dL — ABNORMAL HIGH (ref 70–99)
Potassium: 4.5 meq/L (ref 3.5–5.1)
Sodium: 139 meq/L (ref 135–145)

## 2023-12-24 LAB — URINALYSIS, ROUTINE W REFLEX MICROSCOPIC
Bilirubin Urine: NEGATIVE
Ketones, ur: NEGATIVE
Leukocytes,Ua: NEGATIVE
Nitrite: NEGATIVE
Specific Gravity, Urine: <=1.005 — AB (ref 1.000–1.030)
Total Protein, Urine: NEGATIVE
Urine Glucose: NEGATIVE
Urobilinogen, UA: 0.2 (ref 0.0–1.0)
pH: 6.5 (ref 5.0–8.0)

## 2023-12-24 LAB — VITAMIN D 25 HYDROXY (VIT D DEFICIENCY, FRACTURES): VITD: 51.93 ng/mL (ref 30.00–100.00)

## 2023-12-24 LAB — LIPID PANEL
Cholesterol: 176 mg/dL (ref 0–200)
HDL: 100.5 mg/dL (ref >39.00)
LDL Cholesterol: 65 mg/dL (ref 0–99)
NonHDL: 75.15
Total CHOL/HDL Ratio: 2
Triglycerides: 51 mg/dL (ref 0.0–149.0)
VLDL: 10.2 mg/dL (ref 0.0–40.0)

## 2023-12-24 LAB — HEPATIC FUNCTION PANEL
ALT: 14 U/L (ref 0–35)
AST: 18 U/L (ref 0–37)
Albumin: 4.2 g/dL (ref 3.5–5.2)
Alkaline Phosphatase: 76 U/L (ref 39–117)
Bilirubin, Direct: 0.2 mg/dL (ref 0.0–0.3)
Total Bilirubin: 0.7 mg/dL (ref 0.2–1.2)
Total Protein: 7.3 g/dL (ref 6.0–8.3)

## 2023-12-24 LAB — HEMOGLOBIN A1C: Hgb A1c MFr Bld: 5.9 % (ref 4.6–6.5)

## 2023-12-24 LAB — MICROALBUMIN / CREATININE URINE RATIO
Creatinine,U: 18.3 mg/dL
Microalb Creat Ratio: 51.2 mg/g — ABNORMAL HIGH (ref 0.0–30.0)
Microalb, Ur: 0.9 mg/dL (ref 0.0–1.9)

## 2023-12-24 LAB — VITAMIN B12: Vitamin B-12: 306 pg/mL (ref 211–911)

## 2023-12-24 LAB — TSH: TSH: 1.41 u[IU]/mL (ref 0.35–5.50)

## 2023-12-24 MED ORDER — DENOSUMAB 60 MG/ML ~~LOC~~ SOSY
60.0000 mg | PREFILLED_SYRINGE | Freq: Once | SUBCUTANEOUS | Status: AC
  Administered 2024-02-04: 60 mg via SUBCUTANEOUS

## 2023-12-24 NOTE — Progress Notes (Signed)
 The test results show that your current treatment is OK, as the tests are stable.  Please continue the same plan.  There is no other need for change of treatment or further evaluation based on these results, at this time.  thanks

## 2023-12-24 NOTE — Patient Instructions (Addendum)
 You will be contacted regarding the referral for: Crystal [name] the office pharmacist regarding the prolia, and maybe even Thane Edu RN in the office  You will be contacted regarding the referral for: Bone Density at [facility]  Please have your Shingrix (shingles) shots done at your local pharmacy., and the Tdap tetanus shot  Please continue all other medications as before, and refills have been done if requested.  Please have the pharmacy call with any other refills you may need.  Please continue your efforts at being more active, low cholesterol diet, and weight control.  You are otherwise up to date with prevention measures today.  Please keep your appointments with your specialists as you may have planned  Please go to the LAB at the blood drawing area for the tests to be done  You will be contacted by phone if any changes need to be made immediately.  Otherwise, you will receive a letter about your results with an explanation, but please check with MyChart first.  Please make an Appointment to return for your 1 year visit, or sooner if needed

## 2023-12-24 NOTE — Progress Notes (Signed)
 Patient ID: Crystal [name], female   DOB: June 29, 1951, 73 y.o.   MRN: [id number]         Chief Complaint:: wellness exam and copd, low b12, htn, hld, hyperglycemia       HPI:  Crystal [name] is a 73 y.o. female here for wellness exam; for tdap and shingrix at pharmacy, o/w up to date, declines covid booster                        Also doing PT excercises most days, and water fitness 3 times per wk;  prolia tier has changed with her insurance now costing $800 every 6 mo.  Did not toelrate fosamax 2018 and on prolia since that time.  Pt denies chest pain, increased sob or doe, wheezing, orthopnea, PND, increased LE swelling, palpitations, dizziness or syncope.   Pt denies polydipsia, polyuria, or new focal neuro s/s.    Pt denies fever, wt loss, night sweats, loss of appetite, or other constitutional symptoms     Wt Readings from Last 3 Encounters:  12/24/23 195 lb (88.5 kg)  08/25/23 188 lb 9.6 oz (85.5 kg)  08/18/23 188 lb 9.6 oz (85.5 kg)   BP Readings from Last 3 Encounters:  12/24/23 120/82  12/14/23 138/80  08/26/23 139/63   Immunization History  Administered Date(s) Administered   Hepatitis B, ADULT 01/23/2017, 02/23/2017   Hepatitis B, PED/ADOLESCENT 07/27/2017   PFIZER Comirnaty(Gray Top)Covid-19 Tri-Sucrose Vaccine 02/10/2021   PFIZER(Purple Top)SARS-COV-2 Vaccination 12/13/2019, 12/27/2019, 08/25/2020   Pneumococcal Conjugate-13 03/05/2016   Pneumococcal Polysaccharide-23 03/11/2017   Td 12/21/2006   Tdap 02/17/2013   Zoster Recombinant(Shingrix) 07/27/2023   Health Maintenance Due  Topic Date Due   DTaP/Tdap/Td (3 - Td or Tdap) 02/18/2023   Zoster Vaccines- Shingrix (2 of 2) 09/21/2023      Past Medical History:  Diagnosis Date   ALLERGIC RHINITIS    Allergy    ANXIETY    no medations needed   Arthritis    Asthma    Bronchitis    Cataract    no surgery   COLONIC POLYPS, HX OF    Complication of anesthesia    oxygen sat dropped during endoscopy    COPD     pt unsure of this dx   Cough    DEPRESSION    Dyspnea    Elevated coronary artery calcium score 12/2021   Agatston score 173-all LAD   Fatty liver    GERD    GLUCOSE INTOLERANCE    Hyperlipidemia    HYPERTENSION    IBS    Impaired glucose tolerance    Obstructive sleep apnea 06/25/2018   OSTEOPENIA    Osteopenia    Osteoporosis 07/30/2017   PERIPHERAL EDEMA    Pneumonia    Pneumonia, organism unspecified(486)    Sleep apnea    cpap   TOBACCO USE DISORDER/SMOKER-SMOKING CESSATION DISCUSSED    Past Surgical History:  Procedure Laterality Date   birthmark removed     from back as a child   bladder mesh     2012   CESAREAN SECTION     x2   CHOLECYSTECTOMY     COLONOSCOPY  2018   TA   MOUTH SURGERY  04/25/2020   tissue graft -room of mouth to front gums   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   Procedure: LEFT TOTAL HIP ARTHROPLASTY ANTERIOR APPROACH;  Surgeon: Marcene Corning, MD;  Location: [facility];  Service: Orthopedics;  Laterality: Left;   TOTAL HIP ARTHROPLASTY Right 08/25/2023   Procedure: RIGHT TOTAL HIP ARTHROPLASTY ANTERIOR APPROACH;  Surgeon: Marcene Corning, MD;  Location: [facility];  Service: Orthopedics;  Laterality: Right;   TOTAL KNEE ARTHROPLASTY Left 02/17/2023   Procedure: LEFT TOTAL KNEE ARTHROPLASTY;  Surgeon: Marcene Corning, MD;  Location: [facility];  Service: Orthopedics;  Laterality: Left;   WISDOM TOOTH EXTRACTION      reports that she quit smoking about 14 months ago. Her smoking use included cigarettes. She started smoking about 46 years ago. She has a 45 pack-year smoking history. She has never used smokeless tobacco. She reports current alcohol use of about 21.0 standard drinks of alcohol per week. She reports current drug use. family history includes Allergic rhinitis in her sister; Anxiety disorder in an other family member; Basal cell carcinoma in her father and maternal grandfather; Basal cell carcinoma (age of onset: 58 - 11) in her mother;  Bladder Cancer (age of onset: 86) in her father; Breast cancer in her sister; Breast cancer (age of onset: 54) in her sister; Cervical cancer in her sister; Colon cancer (age of onset: 7) in her father; Colon polyps in her father; Diabetes in an other family member; Heart attack (age of onset: 59) in her maternal grandmother; Heart attack (age of onset: 49) in her maternal uncle; Hyperlipidemia in an other family member; Hypothyroidism in an other family member; Melanoma in her paternal grandfather; Other in her mother; Skin cancer in her brother; Thyroid cancer (age of onset: 22) in her paternal grandmother. Allergies  Allergen Reactions   Tape     Removed a layer of skin when removed, please use paper tape   Ace Inhibitors Cough   Penicillins Rash   Current Outpatient Medications on File Prior to Visit  Medication Sig Dispense Refill   albuterol (VENTOLIN HFA) 108 (90 Base) MCG/ACT inhaler Inhale 2 puffs into the lungs every 4 (four) hours as needed for wheezing or shortness of breath (coughing fits). 18 g 1   aspirin EC 81 MG tablet Take 1 tablet (81 mg total) by mouth 2 (two) times daily. For 2 weeks then once a day for 2 weeks after that. (Patient taking differently: Take 81 mg by mouth daily.) 45 tablet 0   atorvastatin (LIPITOR) 40 MG tablet TAKE 1 TABLET BY MOUTH EVERY DAY 90 tablet 3   Budeson-Glycopyrrol-Formoterol (BREZTRI AEROSPHERE) 160-9-4.8 MCG/ACT AERO Inhale 2 puffs into the lungs in the morning and at bedtime. with spacer and rinse mouth afterwards. 10.7 g 5   Calcium Carb-Cholecalciferol 600-800 MG-UNIT TABS Take 1 tablet by mouth daily.     Cholecalciferol (VITAMIN D PO) Take 2,000 Units by mouth daily.      denosumab (PROLIA) 60 MG/ML SOSY injection INJECT 1 SYRINGE UNDER THE SKIN ONCE EVERY 6 MONTHS 180 mL 0   folic acid (FOLVITE) 400 MCG tablet Take 400 mcg by mouth daily.     loratadine (CLARITIN) 10 MG tablet Take 10 mg by mouth daily as needed for allergies.     losartan  (COZAAR) 50 MG tablet TAKE 1 TABLET EVERY DAY (ANNUAL APPOINTMENT DUE IN MAY, MUST SEE MD FOR FUTURE REFILLS) 90 tablet 3   metoprolol succinate (TOPROL-XL) 25 MG 24 hr tablet TAKE 1 TABLET BY MOUTH DAILY. ANNUAL APPT IS DUE W/LABS MUST SEE PROVIDER FOR FUTURE REFILLS 90 tablet 3   NON FORMULARY Pt uses a cpap nightly     tiZANidine (ZANAFLEX) 4 MG tablet Take 1 tablet (4 mg  total) by mouth every 6 (six) hours as needed for muscle spasms. 40 tablet 1   valACYclovir (VALTREX) 1000 MG tablet Take 1,000 mg by mouth 2 (two) times daily as needed (fever blisters).     No current facility-administered medications on file prior to visit.        ROS:  All others reviewed and negative.  Objective        PE:  BP 120/82 (BP Location: Right Arm, Patient Position: Sitting, Cuff Size: Normal)   Pulse 65   Temp 98 F (36.7 C) (Oral)   Ht 5\' 3"  (1.6 m)   Wt 195 lb (88.5 kg)   SpO2 98%   BMI 34.54 kg/m                 Constitutional: Pt appears in NAD               HENT: Head: NCAT.                Right Ear: External ear normal.                 Left Ear: External ear normal.                Eyes: . Pupils are equal, round, and reactive to light. Conjunctivae and EOM are normal               Nose: without d/c or deformity               Neck: Neck supple. Gross normal ROM               Cardiovascular: Normal rate and regular rhythm.                 Pulmonary/Chest: Effort normal and breath sounds without rales or wheezing.                Abd:  Soft, NT, ND, + BS, no organomegaly               Neurological: Pt is alert. At baseline orientation, motor grossly intact               Skin: Skin is warm. No rashes, no other new lesions, LE edema - none               Psychiatric: Pt behavior is normal without agitation   Micro: none  Cardiac tracings I have personally interpreted today:  none  Pertinent Radiological findings (summarize): none   Lab Results  Component Value Date   WBC 6.2 12/24/2023    HGB 13.6 12/24/2023   HCT 40.8 12/24/2023   PLT 269.0 12/24/2023   GLUCOSE 105 (H) 12/24/2023   CHOL 176 12/24/2023   TRIG 51.0 12/24/2023   HDL 100.50 12/24/2023   LDLDIRECT 82.2 07/01/2013   LDLCALC 65 12/24/2023   ALT 14 12/24/2023   AST 18 12/24/2023   NA 139 12/24/2023   K 4.5 12/24/2023   CL 101 12/24/2023   CREATININE 0.64 12/24/2023   BUN 16 12/24/2023   CO2 29 12/24/2023   TSH 1.41 12/24/2023   INR 1.0 08/31/2020   HGBA1C 5.9 12/24/2023   MICROALBUR 0.9 12/24/2023   Assessment/Plan:  Crystal [name] is a 73 y.o. White or Caucasian [1] female with  has a past medical history of ALLERGIC RHINITIS, Allergy, ANXIETY, Arthritis, Asthma, Bronchitis, Cataract, COLONIC POLYPS, HX OF, Complication of anesthesia, COPD, Cough, DEPRESSION, Dyspnea, Elevated coronary artery calcium score (12/2021),  Fatty liver, GERD, GLUCOSE INTOLERANCE, Hyperlipidemia, HYPERTENSION, IBS, Impaired glucose tolerance, Obstructive sleep apnea (06/25/2018), OSTEOPENIA, Osteopenia, Osteoporosis (07/30/2017), PERIPHERAL EDEMA, Pneumonia, Pneumonia, organism unspecified(486), Sleep apnea, and TOBACCO USE DISORDER/SMOKER-SMOKING CESSATION DISCUSSED.  COPD mixed type (HCC) Stable overall, cont inhaler prn  Encounter for well adult exam with abnormal findings Age and sex appropriate education and counseling updated with regular exercise and diet Referrals for preventative services - none needed Immunizations addressed - for tdap and shingrix at pharmacy Smoking counseling  - none needed Evidence for depression or other mood disorder - none significant Most recent labs reviewed. I have personally reviewed and have noted: 1) the patient's medical and social history 2) The patient's current medications and supplements 3) The patient's height, weight, and BMI have been recorded in the chart   B12 deficiency Lab Results  Component Value Date   VITAMINB12 306 12/24/2023   Stable, cont oral replacement  - b12 1000 mcg qd   Essential hypertension BP Readings from Last 3 Encounters:  12/24/23 120/82  12/14/23 138/80  08/26/23 139/63   Stable, pt to continue medical treatment losaran 50 mg every day, toprol xl 25 qd   Hyperlipidemia with target LDL less than 70 Lab Results  Component Value Date   LDLCALC 65 12/24/2023   Stable, pt to continue current statin lipitor 40 mg qd   Impaired glucose tolerance Lab Results  Component Value Date   HGBA1C 5.9 12/24/2023   Stable, pt to continue current medical treatment  - diet , wt control   Vitamin D deficiency Last vitamin D Lab Results  Component Value Date   VD25OH 51.93 12/24/2023   Stable, cont oral replacement   Osteoporosis Dw pt - for restart prolia when able,  to f/u any worsening symptoms or concerns for DXA  Followup: Return in about 1 year (around 12/23/2024).  Oliver Barre, MD 12/27/2023 3:49 PM [facility]

## 2023-12-25 ENCOUNTER — Telehealth: Payer: Self-pay

## 2023-12-25 ENCOUNTER — Telehealth: Payer: Self-pay | Admitting: *Deleted

## 2023-12-25 NOTE — Progress Notes (Signed)
 Care Guide Pharmacy Note  12/25/2023 Name: Crystal [name] MRN: [id number] DOB: February 16, 1951  Referred By: Corwin Levins, MD Reason for referral: Care Coordination (Outreach to schedule referral with pharmacist )   Crystal [name] is a 73 y.o. year old female who is a primary care patient of Corwin Levins, MD.  [name] was referred to the pharmacist for assistance related to:  medication assistance  An unsuccessful telephone outreach was attempted today to contact the patient who was referred to the pharmacy team for assistance with medication assistance. Additional attempts will be made to contact the patient.  Burman Nieves, CMA, Care Guide [facility]  [facility], [facility] Guide Direct Dial: [phone]  Fax: [phone] Website: [email or web address]

## 2023-12-25 NOTE — Progress Notes (Signed)
 Care Guide Pharmacy Note  12/25/2023 Name: [name] MRN: [id number] DOB: [birth year]  Referred By: Corwin Levins, MD Reason for referral: Care Coordination (Outreach to schedule referral with pharmacist )   Crystal [name] is a 74 y.o. year old female who is a primary care patient of Corwin Levins, MD.  [name] was referred to the pharmacist for assistance related to:  medication assistance Prolia  Successful contact was made with the patient to discuss pharmacy services including being ready for the pharmacist to call at least 5 minutes before the scheduled appointment time and to have medication bottles and any blood pressure readings ready for review. The patient agreed to meet with the pharmacist via telephone visit on 01/07/2024  Burman Nieves, CMA, Care Guide [facility], [facility] Guide Direct Dial: [phone]  Fax: [phone] Website: [email or web address]

## 2023-12-25 NOTE — Telephone Encounter (Signed)
 Called and spoke with patient. Patient usually goes through insurance and then pharmacy to order prolia. Patient was informed that starting this year Prolia had been put in a new tier. Patient would like to attempt tier exception first. Would like to know the process for this to get it started. If tier exception did not work patient would like to speak with PharmD. Also looks she has not had order placed for prolia yet. Will do this so that prior auth could be ran in the mean time.  Phone number for [employer]: [phone]  Fax: [phone]  Department: HCPR

## 2023-12-27 ENCOUNTER — Encounter: Payer: Self-pay | Admitting: Internal Medicine

## 2023-12-27 NOTE — Assessment & Plan Note (Signed)
 Last vitamin D Lab Results  Component Value Date   VD25OH 51.93 12/24/2023   Stable, cont oral replacement

## 2023-12-27 NOTE — Assessment & Plan Note (Signed)
Stable overall, cont inhaler prn 

## 2023-12-27 NOTE — Assessment & Plan Note (Signed)
 Lab Results  Component Value Date   VITAMINB12 306 12/24/2023   Stable, cont oral replacement - b12 1000 mcg qd

## 2023-12-27 NOTE — Assessment & Plan Note (Signed)
 BP Readings from Last 3 Encounters:  12/24/23 120/82  12/14/23 138/80  08/26/23 139/63   Stable, pt to continue medical treatment losaran 50 mg every day, toprol xl 25 qd

## 2023-12-27 NOTE — Assessment & Plan Note (Signed)
 Lab Results  Component Value Date   LDLCALC 65 12/24/2023   Stable, pt to continue current statin lipitor 40 mg qd

## 2023-12-27 NOTE — Assessment & Plan Note (Signed)

## 2023-12-27 NOTE — Assessment & Plan Note (Addendum)
 Dw pt - for restart prolia when able,  to f/u any worsening symptoms or concerns for DXA

## 2023-12-27 NOTE — Assessment & Plan Note (Signed)
 Lab Results  Component Value Date   HGBA1C 5.9 12/24/2023   Stable, pt to continue current medical treatment  - diet , wt control

## 2023-12-28 NOTE — Telephone Encounter (Signed)
 Attempted to call number attached and believe it went to fax. Attempted to call number fax as well to make sure I didn't get it mixed up and it was also a fax line.  Called patient to try and confirm numbers given and had to leave a voice message.

## 2023-12-28 NOTE — Telephone Encounter (Signed)
 Patient has provided me with two more numbers  [phone]  [phone]

## 2023-12-29 NOTE — Telephone Encounter (Signed)
 Called and set up a tier exception prior authorization for patient's prolia.   Prior Berkley Harvey code: [id number]  They will be faxing [place] the forms needed to start this.

## 2024-01-04 NOTE — Telephone Encounter (Signed)
 Called and spoke with another [facility] rep and they informed me that this tier exception had never been started. Confirmed our fax number with them and they stated they will be sending these forms.   ID: [id number]

## 2024-01-04 NOTE — Telephone Encounter (Signed)
 Called and spoke with patient informing them that we had not received the tier exception form from their insurance company. Will try to get insurance to resend this form.

## 2024-01-05 DIAGNOSIS — M8588 Other specified disorders of bone density and structure, other site: Secondary | ICD-10-CM | POA: Diagnosis not present

## 2024-01-05 DIAGNOSIS — Z7952 Long term (current) use of systemic steroids: Secondary | ICD-10-CM | POA: Diagnosis not present

## 2024-01-05 LAB — HM DEXA SCAN

## 2024-01-06 ENCOUNTER — Encounter: Payer: Self-pay | Admitting: Internal Medicine

## 2024-01-07 ENCOUNTER — Other Ambulatory Visit (INDEPENDENT_AMBULATORY_CARE_PROVIDER_SITE_OTHER): Admitting: Pharmacist

## 2024-01-07 ENCOUNTER — Telehealth: Payer: Self-pay

## 2024-01-07 DIAGNOSIS — M810 Age-related osteoporosis without current pathological fracture: Secondary | ICD-10-CM

## 2024-01-07 NOTE — Progress Notes (Signed)
   01/07/2024 Name: Crystal [name] MRN: [id number] DOB: [birth year]  Chief Complaint  Patient presents with   Osteoporosis   Medication Access    Crystal [name] is a 72 y.o. year old female who presented for a telephone visit.   They were referred to the pharmacist by their PCP for assistance in managing osteoporosis /medication access.    Subjective:  Care Team: Primary Care Provider: Corwin Levins, MD   Medication Access/Adherence  Current Pharmacy:  [phone] [place], [place] - [address] AT CORNER OF [facility] [address] [place] [address] Phone: [phone] Fax: [phone]  [facility] - [place], [place] - [address] [place] [address] Phone: [phone] Fax: [phone]  [facility] [place], [place] - [phone] [place] [address] Phone: [phone] Fax: [phone]  [facility] [name], [place] - [address] [place] [address] Phone: [phone] Fax: [phone]   Patient reports affordability concerns with their medications: Yes  Patient reports access/transportation concerns to their pharmacy: Yes   Pt has been on Prolia since 2019 and cost was previously affordable through [facility] (around $100-$200 per copay) however this year the price went up to $805 through [facility].  Zack, CMA, has been working on this as well. Has been trying to do a Radio producer.   Objective:  Lab Results  Component Value Date   HGBA1C 5.9 12/24/2023    Lab Results  Component Value Date   CREATININE 0.64 12/24/2023   BUN 16 12/24/2023   NA 139 12/24/2023   K 4.5 12/24/2023   CL 101 12/24/2023   CO2 29 12/24/2023    Lab Results  Component Value Date   CHOL 176 12/24/2023   HDL 100.50 12/24/2023   LDLCALC 65 12/24/2023   LDLDIRECT 82.2 07/01/2013   TRIG 51.0  12/24/2023   CHOLHDL 2 12/24/2023    Medications Reviewed Today   Medications were not reviewed in this encounter       Assessment/Plan:   Clinic administered med order for Prolia and referral was entered 12/24/23 however no update has been noted from PA team. Waiting to see cost through Part B vs Part D, med may be cheaper now through medical. Have sent a message to PA team asking for status update.  PA team completed Prolia price check - billing through office will be copay of $357. Pt would like to proceed getting through medical benefit instead of through [facility].  Follow Up Plan: PRN  Arbutus Leas, PharmD, BCPS, CPP Clinical Pharmacist Practitioner [facility] [phone]

## 2024-01-07 NOTE — Telephone Encounter (Signed)
 Prolia VOB initiated via [email or web address]  Next Prolia inj DUE:  12/2023

## 2024-01-12 ENCOUNTER — Other Ambulatory Visit (HOSPITAL_COMMUNITY): Payer: Self-pay

## 2024-01-12 NOTE — Telephone Encounter (Signed)
 Pt ready for scheduling for PROLIA on or after : 01/12/24  Option# 1: Buy/Bill (Office supplied medication)  Out-of-pocket cost due at time of clinic visit: $357  Number of injection/visits approved: 2  Primary: [employer] Prolia co-insurance: 20% Admin fee co-insurance: 20%  Secondary: --- Prolia co-insurance:  Admin fee co-insurance:   Medical Benefit Details: Date Benefits were checked: 01/07/24 Deductible: NO/ Coinsurance: 20%/ Admin Fee: 20%  Prior Auth: APPROVED PA# [id number] Expiration Date: 10/21/23-10/19/24  # of doses approved: 2 ----------------------------------------------------------------------- Option# 2- Med Obtained from pharmacy:  Pharmacy benefit: Copay $930.81 (Paid to pharmacy) Admin Fee: $25 (Pay at clinic)  Prior Auth: N/A PA# Expiration Date:   # of doses approved:   If patient wants fill through the pharmacy benefit please send prescription to: [employer], and include estimated need by date in rx notes. Pharmacy will ship medication directly to the office.  Patient NOT eligible for Prolia Copay Card. Copay Card can make patient's cost as little as $25. Link to apply: [email or web address]://www.amgensupportplus.com/copay  ** This summary of benefits is an estimation of the patient's out-of-pocket cost. Exact cost may very based on individual plan coverage.

## 2024-01-12 NOTE — Telephone Encounter (Signed)
 Marland Kitchen

## 2024-01-13 NOTE — Patient Instructions (Signed)
 It was a pleasure speaking with you today!  I will send the request to proceed with getting Prolia through the office for them to get you set up for the injection.  Feel free to call with any questions or concerns!  Arbutus Leas, PharmD, BCPS, CPP Clinical Pharmacist Practitioner [facility] [phone]

## 2024-02-04 ENCOUNTER — Ambulatory Visit

## 2024-02-04 DIAGNOSIS — M810 Age-related osteoporosis without current pathological fracture: Secondary | ICD-10-CM

## 2024-02-04 NOTE — Progress Notes (Signed)
 Patient visits today for their prolia injection. Patient informed of what they had received and tolerated injection well. Patient notified to reach out to office if needed.

## 2024-02-05 ENCOUNTER — Other Ambulatory Visit: Payer: Self-pay | Admitting: Internal Medicine

## 2024-02-08 ENCOUNTER — Other Ambulatory Visit: Payer: Self-pay

## 2024-02-09 ENCOUNTER — Other Ambulatory Visit: Payer: Self-pay | Admitting: Cardiology

## 2024-02-11 ENCOUNTER — Other Ambulatory Visit: Payer: Self-pay

## 2024-02-11 MED ORDER — ATORVASTATIN CALCIUM 40 MG PO TABS: 40.0000 mg | ORAL_TABLET | Freq: Every day | ORAL | 0 refills | Status: DC

## 2024-02-23 DIAGNOSIS — T148XXA Other injury of unspecified body region, initial encounter: Secondary | ICD-10-CM | POA: Diagnosis not present

## 2024-02-23 DIAGNOSIS — L03119 Cellulitis of unspecified part of limb: Secondary | ICD-10-CM | POA: Diagnosis not present

## 2024-02-27 DIAGNOSIS — L03119 Cellulitis of unspecified part of limb: Secondary | ICD-10-CM | POA: Diagnosis not present

## 2024-02-27 DIAGNOSIS — T148XXA Other injury of unspecified body region, initial encounter: Secondary | ICD-10-CM | POA: Diagnosis not present

## 2024-03-03 ENCOUNTER — Other Ambulatory Visit: Payer: Self-pay | Admitting: Cardiology

## 2024-03-30 ENCOUNTER — Ambulatory Visit: Attending: Cardiology | Admitting: Cardiology

## 2024-03-30 ENCOUNTER — Encounter: Payer: Self-pay | Admitting: Cardiology

## 2024-03-30 VITALS — BP 136/78 | HR 74 | Ht 63.0 in | Wt 197.6 lb

## 2024-03-30 DIAGNOSIS — E785 Hyperlipidemia, unspecified: Secondary | ICD-10-CM | POA: Diagnosis not present

## 2024-03-30 DIAGNOSIS — I10 Essential (primary) hypertension: Secondary | ICD-10-CM

## 2024-03-30 DIAGNOSIS — I2583 Coronary atherosclerosis due to lipid rich plaque: Secondary | ICD-10-CM | POA: Diagnosis not present

## 2024-03-30 DIAGNOSIS — J449 Chronic obstructive pulmonary disease, unspecified: Secondary | ICD-10-CM

## 2024-03-30 DIAGNOSIS — I517 Cardiomegaly: Secondary | ICD-10-CM | POA: Diagnosis not present

## 2024-03-30 DIAGNOSIS — R931 Abnormal findings on diagnostic imaging of heart and coronary circulation: Secondary | ICD-10-CM

## 2024-03-30 DIAGNOSIS — I2510 Atherosclerotic heart disease of native coronary artery without angina pectoris: Secondary | ICD-10-CM | POA: Diagnosis not present

## 2024-03-30 DIAGNOSIS — G4733 Obstructive sleep apnea (adult) (pediatric): Secondary | ICD-10-CM

## 2024-03-30 MED ORDER — ATORVASTATIN CALCIUM 40 MG PO TABS: 40.0000 mg | ORAL_TABLET | Freq: Every day | ORAL | 3 refills | Status: AC

## 2024-03-30 NOTE — Progress Notes (Signed)
 Cardiology Office Note:  .   Date:  04/03/2024  ID:  Crystal [name], DOB Dec 18, 1950, MRN [id number] PCP: Roslyn Coombe, MD  [facility] Providers Cardiologist:  Randene Bustard, MD     Chief Complaint  Patient presents with   Follow-up    Doing well.   Coronary Artery Disease    Mildly elevated coronary calcium  score.    Patient Profile: .     Crystal [name] is a  73 y.o. female  with a PMH notable for HTN, HLD and CAC of 173 (nonobstructive CAD), who presents here for 60-month follow-up at the request of Roslyn Coombe, MD.  I saw her once in March 2023 for elevated Coronary Calcium  Score.  We discussed risk factor modification and recommended increasing statin.     Crystal [name] was last seen on 06/23/2023 by Morey Ar, NP, NP for preop cardiovascular evaluation for left knee replacement.  No cardiac symptoms.  Cleared for surgery.  LDL is 60-at goal.  BP stable on metoprolol  and losartan .  Subjective  Discussed the use of AI scribe software for clinical note transcription with the patient, who gave verbal consent to proceed.  History of Present Illness  Crystal [name] is a 73 year old female who presents for follow-up regarding her cardiovascular health.  She has experienced vertigo since February, characterized by dizziness and spinning sensations when lying down or standing up. The vertigo is positional and does not cause palpitations. No orthopnea or paroxysmal nocturnal dyspnea, although she experiences a pressure sensation in the morning that resolves with movement. She uses a CPAP machine and reports occasional leg swelling, particularly in the left leg, which she manages with compression boots at night.  A lung CT scan last October noted an enlarged heart. She has mild emphysema and arteriosclerosis. No angina during physical activities, although she experiences dyspnea during water  aerobics, which she attends three times a week for 45  minutes. She uses albuterol  three times a week before exercise and a Breo inhaler once daily at night.  Cardiovascular ROS: positive for - dyspnea on exertion, shortness of breath, and this is more related to her pulmonary issues with emphysema. negative for - chest pain, edema, irregular heartbeat, orthopnea, palpitations, paroxysmal nocturnal dyspnea, rapid heart rate, or lightheadedness, dizziness or wooziness, syncope/syncope or TIA/amaurosis fugax, claudication  She has a history of joint surgeries, including both hips and the left knee. The left knee surgery in April was particularly challenging, but she reports better recovery with the right hip surgery in November 2021. She experiences tightness in the hip when standing after sitting for long periods but feels fine once she starts moving.  Her current medications include Toprol  25 mg, Lipitor 40 mg, Losartan  50 mg, and aspirin  81 mg. Her blood pressure is typically in the mid-70s over mid-30s, although it was slightly higher during the visit.     Objective   Current Meds  Medication Sig   albuterol  (VENTOLIN  HFA) 108 (90 Base) MCG/ACT inhaler Inhale 2 puffs into the lungs every 4 (four) hours as needed for wheezing or shortness of breath (coughing fits).   aspirin  EC 81 MG tablet Take 1 tablet (81 mg total) by mouth 2 (two) times daily. For 2 weeks then once a day for 2 weeks after that. (Patient taking differently: Take 81 mg by mouth daily.)   atorvastatin  (LIPITOR) 40 MG tablet TAKE 1 TABLET BY MOUTH EVERY DAY   Budeson-Glycopyrrol-Formoterol  (BREZTRI  AEROSPHERE) 160-9-4.8 MCG/ACT  AERO Inhale 2 puffs into the lungs in the morning and at bedtime. with spacer and rinse mouth afterwards.   Calcium  Carb-Cholecalciferol 600-800 MG-UNIT TABS Take 1 tablet by mouth daily.   Cholecalciferol (VITAMIN D  PO) Take 2,000 Units by mouth daily.    denosumab  (PROLIA ) 60 MG/ML SOSY injection INJECT 1 SYRINGE UNDER THE SKIN ONCE EVERY 6 MONTHS   folic  acid (FOLVITE ) 400 MCG tablet Take 400 mcg by mouth daily.   loratadine  (CLARITIN ) 10 MG tablet Take 10 mg by mouth daily as needed for allergies.   losartan  (COZAAR ) 50 MG tablet TAKE 1 TABLET EVERY DAY (ANNUAL APPOINTMENT DUE IN MAY, MUST SEE MD FOR FUTURE REFILLS)   metoprolol  succinate (TOPROL -XL) 25 MG 24 hr tablet TAKE 1 TABLET BY MOUTH DAILY. ANNUAL APPT IS DUE W/LABS MUST SEE PROVIDER FOR FUTURE REFILLS   NON FORMULARY Pt uses a cpap nightly   valACYclovir  (VALTREX ) 1000 MG tablet Take 1,000 mg by mouth 2 (two) times daily as needed (fever blisters).     Studies Reviewed: Aaron Aas   EKG Interpretation Date/Time:  Wednesday March 30 2024 09:51:32 EDT Ventricular Rate:  74 PR Interval:  136 QRS Duration:  88 QT Interval:  386 QTC Calculation: 428 R Axis:   55  Text Interpretation: Normal sinus rhythm Normal ECG When compared with ECG of 23-Jun-2023 13:16, No significant change was found Confirmed by Randene Bustard ([id number]) on 03/30/2024 10:06:41 AM   NO RECENT STUDIES   LABS Total cholesterol: 176 mg/dL (82/95/6213) HDL: 086 mg/dL (57/84/6962) LDL: 65 mg/dL (95/28/4132) Triglycerides: 51 mg/dL (44/10/270) Hemoglobin A1c: 5.9% (12/24/2023) Hemoglobin: 13.6 g/dL (53/66/4403) Creatinine: 0.64 mg/dL (47/42/5956) Potassium: 4.5 mmol/L (12/24/2023) Thyroid -stimulating hormone (TSH): 1.4 mIU/L (12/24/2023)  RADIOLOGY Lung CT scan: Enlarged heart, arterial sclerosis, mild emphysema (07/2023)   Risk Assessment/Calculations:          Physical Exam:   VS:  BP 136/78 (BP Location: Left Arm, Patient Position: Sitting, Cuff Size: Large)   Pulse 74   Ht 5' 3 (1.6 m)   Wt 197 lb 9.6 oz (89.6 kg)   SpO2 97%   BMI 35.00 kg/m    Wt Readings from Last 3 Encounters:  03/30/24 197 lb 9.6 oz (89.6 kg)  12/24/23 195 lb (88.5 kg)  08/25/23 188 lb 9.6 oz (85.5 kg)    GEN: Well nourished, well developed in no acute distress; moderately obese NECK: No JVD; No carotid bruits CARDIAC: Normal  S1, S2; RRR, no murmurs, rubs, gallops RESPIRATORY:  Clear to auscultation without rales, wheezing or rhonchi ; nonlabored, good air movement. ABDOMEN: Soft, non-tender, non-distended EXTREMITIES:  No edema; No deformity      ASSESSMENT AND PLAN: .    Problem List Items Addressed This Visit       Cardiology Problems   Cardiomegaly   Previous CT showed enlargement without heart failure symptoms. Recent CT unchanged. No urgent concerns unless future scans show significant changes. - Continue annual CT scans. - Consider echocardiogram if consistent enlargement noted.      Relevant Medications   atorvastatin  (LIPITOR) 40 MG tablet   Coronary artery disease due to lipid rich plaque (Chronic)   See Coronary Calcium  Score      Relevant Medications   atorvastatin  (LIPITOR) 40 MG tablet   Elevated coronary artery calcium  score-173 (Chronic)   Plan continue risk factor modification.  BP, lipids and blood sugars are well-controlled. No active symptoms. - She is taking 81 mg aspirin  which is not unreasonable given her age and  coronary calcium  but okay to hold for procedures or surgeries. -Continue atorvastatin  40 mg daily along with losartan  50 mg daily and Toprol  25 mg daily.      Relevant Medications   atorvastatin  (LIPITOR) 40 MG tablet   Other Relevant Orders   EKG 12-Lead (Completed)   Essential hypertension - Primary (Chronic)   Borderline blood pressure still controlled on current dose of losartan  50 mg daily and Toprol  25 mg daily. - Continue current meds but low threshold to titrate losartan  further and/or add low-dose thiazide diuretic.      Relevant Medications   atorvastatin  (LIPITOR) 40 MG tablet   Other Relevant Orders   EKG 12-Lead (Completed)   Hyperlipidemia with target LDL less than 70 (Chronic)   LDL 65 on 40 mg Lipitor. - Continue Lipitor/atorvastatin  40 mg daily Labs monitored by PCP.      Relevant Medications   atorvastatin  (LIPITOR) 40 MG tablet    Other Relevant Orders   EKG 12-Lead (Completed)     Other   COPD mixed type (HCC)   Mild emphysema. Uses albuterol  pre-exercise along with Breztri  Aerosphere daily      Obstructive sleep apnea (Chronic)   Continue CPAP              Follow-Up: Return in about 1 year (around 03/30/2025) for [employer].     Signed, Arleen Lacer, MD, MS Randene Bustard, M.D., M.S. Interventional Chartered certified accountant  Pager # [phone]

## 2024-03-30 NOTE — Patient Instructions (Signed)
 Medication Instructions:   No changes *If you need a refill on your cardiac medications before your next appointment, please call your pharmacy*   Lab Work:  Not needed   Testing/Procedures:  Not needed  Follow-Up: At [facility], you and your health needs are our priority.  As part of our continuing mission to provide you with exceptional heart care, we have created designated Provider Care Teams.  These Care Teams include your primary Cardiologist (physician) and Advanced Practice Providers (APPs -  Physician Assistants and Nurse Practitioners) who all work together to provide you with the care you need, when you need it.     Your next appointment:   12 month(s)  The format for your next appointment:   In Person  Provider:   Marlana Silvan, NP or Katlyn West, NP      Then, Randene Bustard, MD will plan to see you again in 24 month(s).

## 2024-04-03 ENCOUNTER — Encounter: Payer: Self-pay | Admitting: Cardiology

## 2024-04-03 DIAGNOSIS — I517 Cardiomegaly: Secondary | ICD-10-CM | POA: Insufficient documentation

## 2024-04-03 NOTE — Assessment & Plan Note (Signed)
 Plan continue risk factor modification.  BP, lipids and blood sugars are well-controlled. No active symptoms. - She is taking 81 mg aspirin  which is not unreasonable given her age and coronary calcium  but okay to hold for procedures or surgeries. -Continue atorvastatin  40 mg daily along with losartan  50 mg daily and Toprol  25 mg daily.

## 2024-04-03 NOTE — Assessment & Plan Note (Signed)
 Mild emphysema. Uses albuterol  pre-exercise along with Breztri  Aerosphere daily

## 2024-04-03 NOTE — Assessment & Plan Note (Signed)
 Previous CT showed enlargement without heart failure symptoms. Recent CT unchanged. No urgent concerns unless future scans show significant changes. - Continue annual CT scans. - Consider echocardiogram if consistent enlargement noted.

## 2024-04-03 NOTE — Assessment & Plan Note (Signed)
 Borderline blood pressure still controlled on current dose of losartan  50 mg daily and Toprol  25 mg daily. - Continue current meds but low threshold to titrate losartan  further and/or add low-dose thiazide diuretic.

## 2024-04-03 NOTE — Assessment & Plan Note (Signed)
 See Coronary Calcium  Score

## 2024-04-03 NOTE — Assessment & Plan Note (Signed)
 Continue CPAP.

## 2024-04-03 NOTE — Assessment & Plan Note (Addendum)
 LDL 65 on 40 mg Lipitor. - Continue Lipitor/atorvastatin  40 mg daily Labs monitored by PCP.

## 2024-04-18 ENCOUNTER — Other Ambulatory Visit: Payer: Self-pay | Admitting: Internal Medicine

## 2024-04-18 ENCOUNTER — Ambulatory Visit: Payer: Medicare HMO

## 2024-04-19 ENCOUNTER — Other Ambulatory Visit: Payer: Self-pay

## 2024-04-26 ENCOUNTER — Ambulatory Visit (INDEPENDENT_AMBULATORY_CARE_PROVIDER_SITE_OTHER)

## 2024-04-26 VITALS — BP 134/70 | HR 66 | Ht 63.0 in | Wt 197.0 lb

## 2024-04-26 DIAGNOSIS — Z0000 Encounter for general adult medical examination without abnormal findings: Secondary | ICD-10-CM | POA: Diagnosis not present

## 2024-04-26 NOTE — Patient Instructions (Signed)
 Ms. Perine , Thank you for taking time out of your busy schedule to complete your Annual Wellness Visit with me. I enjoyed our conversation and look forward to speaking with you again next year. I, as well as your care team,  appreciate your ongoing commitment to your health goals. Please review the following plan we discussed and let me know if I can assist you in the future. Your Game plan/ To Do List   Follow up Visits: Next Medicare AWV with our clinical staff: 04/27/2025.   Have you seen your provider in the last 6 months (3 months if uncontrolled diabetes)? Yes Next Office Visit with your provider: Patient stated that she will call office to schedule appointment.  Clinician Recommendations:  Aim for 30 minutes of exercise or brisk walking, 6-8 glasses of water , and 5 servings of fruits and vegetables each day. Keep up the good work.      This is a list of the screening recommended for you and due dates:  Health Maintenance  Topic Date Due   DTaP/Tdap/Td vaccine (3 - Td or Tdap) 02/18/2023   COVID-19 Vaccine (5 - 2024-25 season) 06/21/2023   Zoster (Shingles) Vaccine (2 of 2) 09/21/2023   Medicare Annual Wellness Visit  04/12/2024   Complete foot exam   05/03/2024   Hemoglobin A1C  06/25/2024   Eye exam for diabetics  07/05/2024   Screening for Lung Cancer  07/19/2024   Mammogram  12/15/2024   Yearly kidney function blood test for diabetes  12/23/2024   Yearly kidney health urinalysis for diabetes  12/23/2024   Colon Cancer Screening  07/06/2026   Pneumococcal Vaccine for age over 72  Completed   Hepatitis B Vaccine  Completed   DEXA scan (bone density measurement)  Completed   Hepatitis C Screening  Completed   HPV Vaccine  Aged Out   Meningitis B Vaccine  Aged Out   Flu Shot  Discontinued    Advanced directives: (In Chart) A copy of your advanced directives are scanned into your chart should your provider ever need it. Advance Care Planning is important because it:  [x]   Makes sure you receive the medical care that is consistent with your values, goals, and preferences  [x]  It provides guidance to your family and loved ones and reduces their decisional burden about whether or not they are making the right decisions based on your wishes.  Follow the link provided in your after visit summary or read over the paperwork we have mailed to you to help you started getting your Advance Directives in place. If you need assistance in completing these, please reach out to us  so that we can help you!  See attachments for Preventive Care and Fall Prevention Tips.

## 2024-04-26 NOTE — Progress Notes (Signed)
 Subjective:   Crystal [name] is a 72 y.o. who presents for a Medicare Wellness preventive visit.  As a reminder, Annual Wellness Visits don't include a physical exam, and some assessments may be limited, especially if this visit is performed virtually. We may recommend an in-person follow-up visit with your provider if needed.  Visit Complete: Virtual I connected with  Crystal [name] on 04/26/24 by a audio enabled telemedicine application and verified that I am speaking with the correct person using two identifiers.  Patient Location: Home  Provider Location: Home Office  I discussed the limitations of evaluation and management by telemedicine. The patient expressed understanding and agreed to proceed.  Vital Signs: Because this visit was a virtual/telehealth visit, some criteria may be missing or patient reported. Any vitals not documented were not able to be obtained and vitals that have been documented are patient reported.  VideoDeclined- This patient declined Librarian, academic. Therefore the visit was completed with audio only.  Persons Participating in Visit: Patient.  AWV Questionnaire: No: Patient Medicare AWV questionnaire was not completed prior to this visit.  Cardiac Risk Factors include: advanced age (>73men, >37 women);hypertension;Other (see comment);obesity (BMI >30kg/m2);dyslipidemia, Risk factor comments: OSA,     Objective:    Today's Vitals   04/26/24 0753  Weight: 197 lb (89.4 kg)  Height: 5' 3 (1.6 m)   Body mass index is 34.9 kg/m.     04/26/2024    8:18 AM 08/25/2023    8:00 PM 08/18/2023   12:54 PM 04/13/2023    8:50 AM 02/17/2023   11:29 AM 02/05/2023   10:00 AM 04/07/2022   11:20 AM  Advanced Directives  Does Patient Have a Medical Advance Directive? Yes Yes Yes Yes Yes Yes Yes  Type of Estate agent of [place];Living will Healthcare Power of [place];Living will Healthcare Power of  [place];Living will Healthcare Power of [place];Living will Healthcare Power of [place];Living will Healthcare Power of [place];Living will Healthcare Power of [place];Living will  Does patient want to make changes to medical advance directive? No - Patient declined No - Patient declined  No - Patient declined No - Patient declined    Copy of Healthcare Power of Attorney in Chart? Yes - validated most recent copy scanned in chart (See row information) No - copy requested  Yes - validated most recent copy scanned in chart (See row information) No - copy requested No - copy requested No - copy requested    Current Medications (verified) Outpatient Encounter Medications as of 04/26/2024  Medication Sig   albuterol  (VENTOLIN  HFA) 108 (90 Base) MCG/ACT inhaler Inhale 2 puffs into the lungs every 4 (four) hours as needed for wheezing or shortness of breath (coughing fits).   aspirin  EC 81 MG tablet Take 1 tablet (81 mg total) by mouth 2 (two) times daily. For 2 weeks then once a day for 2 weeks after that. (Patient taking differently: Take 81 mg by mouth daily.)   atorvastatin  (LIPITOR) 40 MG tablet Take 1 tablet (40 mg total) by mouth daily.   Budeson-Glycopyrrol-Formoterol  (BREZTRI  AEROSPHERE) 160-9-4.8 MCG/ACT AERO Inhale 2 puffs into the lungs in the morning and at bedtime. with spacer and rinse mouth afterwards.   Calcium  Carb-Cholecalciferol 600-800 MG-UNIT TABS Take 1 tablet by mouth daily.   Cholecalciferol (VITAMIN D  PO) Take 2,000 Units by mouth daily.    denosumab  (PROLIA ) 60 MG/ML SOSY injection INJECT 1 SYRINGE UNDER THE SKIN ONCE EVERY 6 MONTHS   folic acid  (  FOLVITE ) 400 MCG tablet Take 400 mcg by mouth daily.   loratadine  (CLARITIN ) 10 MG tablet Take 10 mg by mouth daily as needed for allergies.   losartan  (COZAAR ) 50 MG tablet TAKE 1 TABLET EVERY DAY (ANNUAL APPOINTMENT DUE IN MAY, MUST SEE MD FOR FUTURE REFILLS)   metoprolol  succinate (TOPROL -XL) 25 MG 24 hr tablet TAKE 1 TABLET BY  MOUTH DAILY. ANNUAL APPT IS DUE W/LABS MUST SEE PROVIDER FOR FUTURE REFILLS   NON FORMULARY Pt uses a cpap nightly   valACYclovir  (VALTREX ) 1000 MG tablet Take 1,000 mg by mouth 2 (two) times daily as needed (fever blisters).   No facility-administered encounter medications on file as of 04/26/2024.    Allergies (verified) Tape, Ace inhibitors, and Penicillins   History: Past Medical History:  Diagnosis Date   ALLERGIC RHINITIS    Allergy    ANXIETY    no medations needed   Arthritis    Asthma    Bronchitis    Cataract    no surgery   COLONIC POLYPS, HX OF    Complication of anesthesia    oxygen sat dropped during endoscopy    COPD    pt unsure of this dx   Cough    DEPRESSION    Dyspnea    Elevated coronary artery calcium  score 12/2021   Agatston score 173-all LAD   Fatty liver    GERD    GLUCOSE INTOLERANCE    Hyperlipidemia    HYPERTENSION    IBS    Impaired glucose tolerance    Obstructive sleep apnea 06/25/2018   OSTEOPENIA    Osteopenia    Osteoporosis 07/30/2017   PERIPHERAL EDEMA    Pneumonia    Pneumonia, organism unspecified(486)    Sleep apnea    cpap   TOBACCO USE DISORDER/SMOKER-SMOKING CESSATION DISCUSSED    Past Surgical History:  Procedure Laterality Date   birthmark removed     from back as a child   bladder mesh     2012   CESAREAN SECTION     x2   CHOLECYSTECTOMY     COLONOSCOPY  2018   TA   MOUTH SURGERY  04/25/2020   tissue graft -room of mouth to front gums   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   Procedure: LEFT TOTAL HIP ARTHROPLASTY ANTERIOR APPROACH;  Surgeon: Sheril Coy, MD;  Location: [facility];  Service: Orthopedics;  Laterality: Left;   TOTAL HIP ARTHROPLASTY Right 08/25/2023   Procedure: RIGHT TOTAL HIP ARTHROPLASTY ANTERIOR APPROACH;  Surgeon: Sheril Coy, MD;  Location: [facility];  Service: Orthopedics;  Laterality: Right;   TOTAL KNEE ARTHROPLASTY Left 02/17/2023   Procedure: LEFT TOTAL KNEE ARTHROPLASTY;  Surgeon:  Sheril Coy, MD;  Location: [facility];  Service: Orthopedics;  Laterality: Left;   WISDOM TOOTH EXTRACTION     Family History  Problem Relation Age of Onset   Basal cell carcinoma Mother 66 - 63   Other Mother        hx of hysterectomy in her early 33s for fibroid cysts   Colon cancer Father 59       either colon met to bladder or bladder met to colon   Bladder Cancer Father 57   Basal cell carcinoma Father    Colon polyps Father    Breast cancer Sister 61       +lump and tamoxifen; eventually had bilateral mastectomies   Allergic rhinitis Sister    Breast cancer Sister  dx. early 32s; s/p BL mastectomies   Cervical cancer Sister        dx. late 29s   Skin cancer Brother        NOS   Heart attack Maternal Uncle 67   Heart attack Maternal Grandmother 62   Basal cell carcinoma Maternal Grandfather    Thyroid  cancer Paternal Grandmother 23   Melanoma Paternal Grandfather    Anxiety disorder Other    Hypothyroidism Other    Hyperlipidemia Other    Diabetes Other    Esophageal cancer Neg Hx    Stomach cancer Neg Hx    Rectal cancer Neg Hx    Social History   Socioeconomic History   Marital status: Widowed    Spouse name: Not on file   Number of children: 2   Years of education: Not on file   Highest education level: Not on file  Occupational History   Occupation: Data processing manager industries, signage for [employer]    Comment: Retired/ Part time  Tobacco Use   Smoking status: Some Days    Current packs/day: 0.00    Average packs/day: 1 pack/day for 45.0 years (45.0 ttl pk-yrs)    Types: Cigarettes    Start date: 10/20/1977    Last attempt to quit: 10/13/2022    Years since quitting: 1.5   Smokeless tobacco: Never  Vaping Use   Vaping status: Never Used  Substance and Sexual Activity   Alcohol use: Yes    Alcohol/week: 21.0 standard drinks of alcohol    Types: 7 Glasses of wine, 14 Standard drinks or equivalent per week    Comment: occas   Drug  use: Yes   Sexual activity: Not on file  Other Topics Concern   Not on file  Social History Narrative   She indicates that she has been most of her life sedentary, but started exercising in the fall of October 2022: Has started doing water  aerobics.      She had work up to 4 days a week, but has had to scale down to about 2 days a week because of knee pain.      Lives alone/2025 with 2 cats   Social Drivers of Health   Financial Resource Strain: Low Risk  (04/26/2024)   Overall Financial Resource Strain (CARDIA)    Difficulty of Paying Living Expenses: Not very hard  Food Insecurity: No Food Insecurity (04/26/2024)   Hunger Vital Sign    Worried About Running Out of Food in the Last Year: Never true    Ran Out of Food in the Last Year: Never true  Transportation Needs: No Transportation Needs (04/26/2024)   PRAPARE - Administrator, Civil Service (Medical): No    Lack of Transportation (Non-Medical): No  Physical Activity: Insufficiently Active (04/26/2024)   Exercise Vital Sign    Days of Exercise per Week: 3 days    Minutes of Exercise per Session: 20 min  Stress: No Stress Concern Present (04/26/2024)   [employer] of Occupational Health - Occupational Stress Questionnaire    Feeling of Stress: Not at all  Social Connections: Socially Isolated (04/26/2024)   Social Connection and Isolation Panel    Frequency of Communication with Friends and Family: More than three times a week    Frequency of Social Gatherings with Friends and Family: Three times a week    Attends Religious Services: Never    Active Member of Clubs or Organizations: No    Attends Club  or Organization Meetings: Never    Marital Status: Divorced    Tobacco Counseling Ready to quit: Not Answered Counseling given: Not Answered    Clinical Intake:  Pre-visit preparation completed: Yes  Pain : No/denies pain     BMI - recorded: 34.9 Nutritional Status: BMI > 30  Obese Nutritional Risks:  None Diabetes: No  Lab Results  Component Value Date   HGBA1C 5.9 12/24/2023   HGBA1C 5.2 04/20/2023   HGBA1C 5.1 11/20/2022     How often do you need to have someone help you when you read instructions, pamphlets, or other written materials from your doctor or pharmacy?: 1 - Never  Interpreter Needed?: No  Information entered by :: Maeve Debord, RMA   Activities of Daily Living     04/26/2024    8:14 AM 08/25/2023    8:00 PM  In your present state of health, do you have any difficulty performing the following activities:  Hearing? 0 0  Vision? 0 0  Difficulty concentrating or making decisions? 0 0  Walking or climbing stairs? 0   Dressing or bathing? 0   Doing errands, shopping? 0 0  Preparing Food and eating ? N   Using the Toilet? N   In the past six months, have you accidently leaked urine? N   Do you have problems with loss of bowel control? N   Managing your Medications? N   Managing your Finances? N   Housekeeping or managing your Housekeeping? N     Patient Care Team: Norleen Lynwood ORN, MD as PCP - General (Internal Medicine) Anner Alm ORN, MD as PCP - Cardiology (Cardiology) [facility] (Ophthalmology)  I have updated your Care Teams any recent Medical Services you may have received from other providers in the past year.     Assessment:   This is a routine wellness examination for Beretta.  Hearing/Vision screen Hearing Screening - Comments:: Denies hearing difficulties   Vision Screening - Comments:: Wears eyeglasses/ Dr. Octavia [facility]   Goals Addressed             This Visit's Progress    My goal for 2024 is to get back into the water  and not have to depend on using a cane.   On track      Depression Screen     04/26/2024    8:21 AM 12/24/2023    8:10 AM 05/04/2023    9:58 AM 04/13/2023    8:52 AM 11/20/2022    9:02 AM 04/24/2022    9:48 AM 04/07/2022   11:21 AM  PHQ 2/9 Scores  PHQ - 2 Score 0 0 0 0 0 0 0  PHQ- 9 Score 1  1 0  0 3     Fall Risk     04/26/2024    8:18 AM 12/24/2023    8:16 AM 05/04/2023    9:58 AM 04/13/2023    8:52 AM 11/20/2022    9:02 AM  Fall Risk   Falls in the past year? 0 0 0 0 0  Number falls in past yr: 0 0 0 0 0  Injury with Fall? 0 0 0 0 0  Risk for fall due to :  No Fall Risks No Fall Risks No Fall Risks No Fall Risks  Follow up Falls evaluation completed;Falls prevention discussed Falls evaluation completed Falls evaluation completed Falls prevention discussed Falls evaluation completed    MEDICARE RISK AT HOME:  Medicare Risk at Home Any stairs in  or around the home?: Yes (2 steps into home/& deck) If so, are there any without handrails?: No Home free of loose throw rugs in walkways, pet beds, electrical cords, etc?: Yes Adequate lighting in your home to reduce risk of falls?: Yes Life alert?: No Use of a cane, walker or w/c?: No Grab bars in the bathroom?: Yes (on tub) Shower chair or bench in shower?: No Elevated toilet seat or a handicapped toilet?: No  TIMED UP AND GO:  Was the test performed?  No  Cognitive Function: Declined/Normal: No cognitive concerns noted by patient or family. Patient alert, oriented, able to answer questions appropriately and recall recent events. No signs of memory loss or confusion.        04/13/2023    8:52 AM  6CIT Screen  What Year? 0 points  What month? 0 points  What time? 0 points  Count back from 20 0 points  Months in reverse 0 points  Repeat phrase 0 points  Total Score 0 points    Immunizations Immunization History  Administered Date(s) Administered   Hepatitis B, ADULT 01/23/2017, 02/23/2017   Hepatitis B, PED/ADOLESCENT 07/27/2017   PFIZER Comirnaty(Gray Top)Covid-19 Tri-Sucrose Vaccine 02/10/2021   PFIZER(Purple Top)SARS-COV-2 Vaccination 12/13/2019, 12/27/2019, 08/25/2020   Pneumococcal Conjugate-13 03/05/2016   Pneumococcal Polysaccharide-23 03/11/2017   Td 12/21/2006   Tdap 02/17/2013, 03/18/2024   Zoster  Recombinant(Shingrix) 07/27/2023, 01/16/2024    Screening Tests Health Maintenance  Topic Date Due   COVID-19 Vaccine (5 - 2024-25 season) 06/21/2023   FOOT EXAM  05/03/2024   HEMOGLOBIN A1C  06/25/2024   OPHTHALMOLOGY EXAM  07/05/2024   Lung Cancer Screening  07/19/2024   MAMMOGRAM  12/15/2024   Diabetic kidney evaluation - eGFR measurement  12/23/2024   Diabetic kidney evaluation - Urine ACR  12/23/2024   Medicare Annual Wellness (AWV)  04/26/2025   Colonoscopy  07/06/2026   DTaP/Tdap/Td (4 - Td or Tdap) 03/18/2034   Pneumococcal Vaccine: 50+ Years  Completed   Hepatitis B Vaccines  Completed   DEXA SCAN  Completed   Hepatitis C Screening  Completed   Zoster Vaccines- Shingrix  Completed   HPV VACCINES  Aged Out   Meningococcal B Vaccine  Aged Out   INFLUENZA VACCINE  Discontinued    Health Maintenance  Health Maintenance Due  Topic Date Due   COVID-19 Vaccine (5 - 2024-25 season) 06/21/2023   Health Maintenance Items Addressed: See Nurse Notes at the end of this note  Additional Screening:  Vision Screening: Recommended annual ophthalmology exams for early detection of glaucoma and other disorders of the eye. Would you like a referral to an eye doctor? No    Dental Screening: Recommended annual dental exams for proper oral hygiene  Community Resource Referral / Chronic Care Management: CRR required this visit?  No   CCM required this visit?  No   Plan:    I have personally reviewed and noted the following in the patient's chart:   Medical and social history Use of alcohol, tobacco or illicit drugs  Current medications and supplements including opioid prescriptions. Patient is not currently taking opioid prescriptions. Functional ability and status Nutritional status Physical activity Advanced directives List of other physicians Hospitalizations, surgeries, and ER visits in previous 12 months Vitals Screenings to include cognitive, depression, and  falls Referrals and appointments  In addition, I have reviewed and discussed with patient certain preventive protocols, quality metrics, and best practice recommendations. A written personalized care plan for preventive services as well  as general preventive health recommendations were provided to patient.   Elyssa Pendelton L Dasan Hardman, CMA   04/26/2024   After Visit Summary: (MyChart) Due to this being a telephonic visit, the after visit summary with patients personalized plan was offered to patient via MyChart   Notes: Patient is up to date on all health maintenance with no concerns to address today.  She stated that she will call the office to schedule an appointment.

## 2024-06-20 NOTE — Progress Notes (Unsigned)
 HPI Female Smoker followed for OSA/ oral appliance, complicated by allergic rhinitis, anxiety, COPD, HBP, GERD HST 07/20/2018-AHI 20/hour, desaturation to 81%, body weight 192 pounds PFT 09/04/21- WNL.  ====================================================================    06/19/23- 73 year old female former Smoker (45pkyrs) followed for OSA, complicated by allergic rhinitis, anxiety, COPD, HBP, GERD Allergist  and PCP were following COPD   -Breztri , alubterol hfa,  CPAP auto 5-15/ [employer] Download- compliance  100%, AHI 0.3/hr Body weight today-191 lbs Needing clearance for R THR in October. Spirometries through her allergist have been within normal limits.  She uses Breztri  once daily and uses her rescue albuterol  inhaler only ahead of exercise such as her water  exercise class.  She denies any recent significant exacerbation and feels quite stable. CPAP download reviewed.  She is doing very well and reports sleeping well with CPAP. No reasons to defer or change surgical plans are identified at this visit.  06/21/24-  73 year old female former Smoker (45pkyrs) followed for OSA, complicated by allergic rhinitis, anxiety, COPD, HBP, GERD Allergist  and PCP were following COPD   -Breztri , alubterol hfa,  CPAP auto 5-15/ [employer] Download- compliance- not available Body weight today-   CT chest LD screen 07/20/23 MPRESSION: 1. Lung-RADS 1, negative. Continue annual screening with low-dose chest CT without contrast in 12 months. 2. Left adrenal adenoma. 3. Aortic atherosclerosis (ICD10-I70.0). Coronary artery calcification. 4.  Emphysema (ICD10-J43.9).  ROS-see HPI   + = positive Constitutional:    weight loss, night sweats, fevers, chills, +fatigue, lassitude. HEENT:    headaches, difficulty swallowing, tooth/dental problems, sore throat,       sneezing, itching, ear ache, nasal congestion, post nasal drip, snoring CV:    chest pain, orthopnea, PND, +swelling in lower extremities,  anasarca,                                   dizziness, palpitations Resp:  + shortness of breath with exertion or at rest.                productive cough,   non-productive cough, coughing up of blood.              change in color of mucus.  wheezing.   Skin:    rash or lesions. GI:  No-   heartburn, indigestion, abdominal pain, nausea, vomiting, diarrhea,                 change in bowel habits, loss of appetite GU: dysuria, change in color of urine, no urgency or frequency.   flank pain. MS:   joint pain, stiffness, decreased range of motion, back pain. Neuro-     nothing unusual Psych:  change in mood or affect.  depression or anxiety.   memory loss.  OBJ- Physical Exam General- Alert, Oriented, Affect-appropriate, Distress- none acute, + overweight Skin- rash-none, lesions- none, excoriation- none Lymphadenopathy- none Head- atraumatic            Eyes- Gross vision intact, PERRLA, conjunctivae and secretions clear            Ears- Hearing, canals-normal            Nose- Clear, no-Septal dev, mucus, polyps, erosion, perforation             Throat- Mallampati II-III , mucosa clear , drainage- none, tonsils- atrophic Neck- flexible , trachea midline, no stridor , thyroid  nl, carotid no bruit Chest - symmetrical excursion ,  unlabored           Heart/CV- RRR , no murmur , no gallop  , no rub, nl s1 s2                           - JVD- none , edema- none, stasis changes- none, varices- none           Lung- clear to P&A, wheeze- none, cough-none, dullness-none, rub- none           Chest wall-  Abd-  Br/ Gen/ Rectal- Not done, not indicated Extrem- +heavy legs with small superficial varices, but no pitting edema.  Neuro- grossly intact to observation

## 2024-06-21 ENCOUNTER — Ambulatory Visit: Payer: Medicare HMO | Admitting: Internal Medicine

## 2024-06-21 ENCOUNTER — Encounter: Payer: Self-pay | Admitting: Internal Medicine

## 2024-06-21 VITALS — BP 134/70 | HR 92 | Ht 63.0 in | Wt 202.0 lb

## 2024-06-21 DIAGNOSIS — G4733 Obstructive sleep apnea (adult) (pediatric): Secondary | ICD-10-CM | POA: Diagnosis not present

## 2024-06-21 NOTE — Patient Instructions (Signed)
 Glad you are doing well. We can continue CPAP auto 5-15.  Please let us know if we can help

## 2024-06-23 ENCOUNTER — Telehealth: Payer: Self-pay

## 2024-06-23 NOTE — Telephone Encounter (Signed)
 Copied from CRM #[id number]. Topic: Clinical - Medical Advice >> Jun 23, 2024  8:34 AM Viola F wrote: Reason for CRM: Patient called to schedule prolia  injection - says her last one was  02/03/24. Please call [phone]

## 2024-06-24 ENCOUNTER — Encounter: Payer: Self-pay | Admitting: Internal Medicine

## 2024-06-29 ENCOUNTER — Other Ambulatory Visit: Payer: Self-pay

## 2024-06-29 ENCOUNTER — Encounter: Payer: Self-pay | Admitting: Allergy

## 2024-06-29 ENCOUNTER — Ambulatory Visit: Admitting: Allergy

## 2024-06-29 VITALS — BP 138/80 | HR 88 | Temp 98.7°F | Resp 18 | Ht 63.0 in | Wt 201.7 lb

## 2024-06-29 DIAGNOSIS — J3089 Other allergic rhinitis: Secondary | ICD-10-CM | POA: Diagnosis not present

## 2024-06-29 DIAGNOSIS — J4489 Other specified chronic obstructive pulmonary disease: Secondary | ICD-10-CM | POA: Diagnosis not present

## 2024-06-29 DIAGNOSIS — J301 Allergic rhinitis due to pollen: Secondary | ICD-10-CM | POA: Diagnosis not present

## 2024-06-29 MED ORDER — BREZTRI AEROSPHERE 160-9-4.8 MCG/ACT IN AERO: 2.0000 | INHALATION_SPRAY | Freq: Two times a day (BID) | RESPIRATORY_TRACT | 5 refills | Status: AC

## 2024-06-29 MED ORDER — ALBUTEROL SULFATE HFA 108 (90 BASE) MCG/ACT IN AERS: 2.0000 | INHALATION_SPRAY | RESPIRATORY_TRACT | 1 refills | Status: AC | PRN

## 2024-06-29 NOTE — Patient Instructions (Addendum)
 Breathing Today's breathing test was normal.  Daily controller medication(s):  Continue Breztri  2 puffs once day with spacer and rinse mouth afterwards.  If you find the inhaler too expensive call the office as sometimes we do have samples.  During respiratory infections/flares:  Increase Breztri  to 2 puffs twice a day for 1-2 weeks until your breathing symptoms return to baseline.  Pretreat with albuterol  2 puffs. May use albuterol  rescue inhaler 2 puffs every 4 to 6 hours as needed for shortness of breath, chest tightness, coughing, and wheezing. May use albuterol  rescue inhaler 2 puffs 5 to 15 minutes prior to strenuous physical activities. Monitor frequency of use - if you need to use it more than twice per week on a consistent basis let us  know.  Breathing control goals:  Full participation in all desired activities (may need albuterol  before activity) Albuterol  use two times or less a week on average (not counting use with activity) Cough interfering with sleep two times or less a month Oral steroids no more than once a year No hospitalizations   Allergic rhinitis 2019 skin testing positive to dust mites, grass, trees and borderline to mouse. Continue environmental control measures.  May use over the counter antihistamines such as Zyrtec (cetirizine), Claritin  (loratadine ), Allegra (fexofenadine), or Xyzal (levocetirizine) daily as needed. Nasal saline spray (i.e., Simply Saline) or nasal saline lavage (i.e., NeilMed) is recommended as needed and prior to medicated nasal sprays.  Follow up in 6 months or sooner if needed.

## 2024-06-29 NOTE — Progress Notes (Signed)
 Follow Up Note  RE: Crystal [name] MRN: [id number] DOB: 06-26-1951 Date of Office Visit: 06/29/2024  Referring provider: Norleen Lynwood ORN, MD Primary care provider: Norleen Lynwood ORN, MD  Chief Complaint: Follow-up (No complaints )  History of Present Illness: I had the pleasure of seeing Crystal [name] for a follow up visit at the [facility] on 06/29/2024. She is a 73 y.o. female, who is being followed for COPD with asthma, allergic rhinitis, vertigo. Her previous allergy office visit was on 12/14/2023 with Dr. Luke. Today is a regular follow up visit.  Discussed the use of AI scribe software for clinical note transcription with the patient, who gave verbal consent to proceed.    She takes Breztri , two puffs once a day, for her asthma. She has not experienced any recent asthma exacerbations or respiratory infections and has not required emergency care or prednisone . She uses albuterol  before water  fitness classes three times a week, which she finds helpful.  She does not receive the flu shot due to past experiences of developing bronchitis after vaccination. She takes Claritin  daily for allergies and uses a saline wash but no other allergy medications.  Her vertigo has mostly resolved, with occasional episodes that she attributes to moving too quickly. She no longer feels like she is 'swimming' when lying down.  She has not had any new medications, surgeries, or medical diagnoses since her last visit. She continues to use a CPAP machine for sleep apnea, managed by Dr. Neysa, who is retiring soon.  She requires a refill of albuterol  and anticipates needing a refill of Breztri  before her next visit. The cost of Breztri  is high at the beginning of the year due to her deductible, but it becomes more affordable thereafter.   She has gained some weight but is maintaining her water  aerobics routine and plans to start walking on non-pool days. She is preparing for a trip to [place]  to visit her daughter, who teaches English as a second language there.  No recent asthma attacks or bronchitis since July 2023.     Assessment and Plan: [name] is a 73 y.o. female with: COPD with asthma Past history - Was doing well until June and required oral prednisone  and IM steroid. Breathing is not back to baseline. Quit smoking.  2023 spirometry showed 10% improvement in FEV1 post bronchodilator treatment. Clinically feeling improved. 2023 CT chest done. Interim history - no steroids, no flares.  Today's spirometry was normal. Daily controller medication(s):  Continue Breztri  2 puffs once day with spacer and rinse mouth afterwards.  If you find the inhaler too expensive call the office as sometimes we do have samples.  During respiratory infections/flares:  Increase Breztri  to 2 puffs twice a day for 1-2 weeks until your breathing symptoms return to baseline.  Pretreat with albuterol  2 puffs. May use albuterol  rescue inhaler 2 puffs every 4 to 6 hours as needed for shortness of breath, chest tightness, coughing, and wheezing. May use albuterol  rescue inhaler 2 puffs 5 to 15 minutes prior to strenuous physical activities. Monitor frequency of use - if you need to use it more than twice per week on a consistent basis let us  know.  Get spirometry at next visit.   Allergic rhinitis due to dust mite Seasonal allergic rhinitis due to pollen Past history - 2019 skin testing positive to dust mites, grass, trees and borderline to mouse. [name]  daily use caused too much dryness.  Interim history - controlled  with daily Claritin .  Continue environmental control measures.  May use over the counter antihistamines such as Zyrtec (cetirizine), Claritin  (loratadine ), Allegra (fexofenadine), or Xyzal (levocetirizine) daily as needed. Nasal saline spray (i.e., Simply Saline) or nasal saline lavage (i.e., NeilMed) is recommended as needed and prior to medicated nasal sprays.   Return in about 6  months (around 12/27/2024).  Meds ordered this encounter  Medications   albuterol  (VENTOLIN  HFA) 108 (90 Base) MCG/ACT inhaler    Sig: Inhale 2 puffs into the lungs every 4 (four) hours as needed for wheezing or shortness of breath (coughing fits).    Dispense:  18 g    Refill:  1   budesonide-glycopyrrolate -formoterol  (BREZTRI  AEROSPHERE) 160-9-4.8 MCG/ACT AERO inhaler    Sig: Inhale 2 puffs into the lungs in the morning and at bedtime. with spacer and rinse mouth afterwards.    Dispense:  10.7 g    Refill:  5   Lab Orders  No laboratory test(s) ordered today    Diagnostics: Spirometry:  Tracings reviewed. Her effort: Good reproducible efforts. FVC: 2.37L FEV1: 1.76L, 87% predicted FEV1/FVC ratio: 74% Interpretation: Spirometry consistent with normal pattern.  Please see scanned spirometry results for details.  Results discussed with patient/family.   Medication List:  Current Outpatient Medications  Medication Sig Dispense Refill   albuterol  (VENTOLIN  HFA) 108 (90 Base) MCG/ACT inhaler Inhale 2 puffs into the lungs every 4 (four) hours as needed for wheezing or shortness of breath (coughing fits). 18 g 1   aspirin  EC 81 MG tablet Take 1 tablet (81 mg total) by mouth 2 (two) times daily. For 2 weeks then once a day for 2 weeks after that. (Patient taking differently: Take 81 mg by mouth daily.) 45 tablet 0   atorvastatin  (LIPITOR) 40 MG tablet Take 1 tablet (40 mg total) by mouth daily. 90 tablet 3   Calcium  Carb-Cholecalciferol 600-800 MG-UNIT TABS Take 1 tablet by mouth daily.     Cholecalciferol (VITAMIN D  PO) Take 2,000 Units by mouth daily.      denosumab  (PROLIA ) 60 MG/ML SOSY injection INJECT 1 SYRINGE UNDER THE SKIN ONCE EVERY 6 MONTHS 180 mL 0   folic acid  (FOLVITE ) 400 MCG tablet Take 400 mcg by mouth daily.     loratadine  (CLARITIN ) 10 MG tablet Take 10 mg by mouth daily as needed for allergies.     losartan  (COZAAR ) 50 MG tablet TAKE 1 TABLET EVERY DAY (ANNUAL  APPOINTMENT DUE IN MAY, MUST SEE MD FOR FUTURE REFILLS) 90 tablet 3   metoprolol  succinate (TOPROL -XL) 25 MG 24 hr tablet TAKE 1 TABLET BY MOUTH DAILY. ANNUAL APPT IS DUE W/LABS MUST SEE PROVIDER FOR FUTURE REFILLS 90 tablet 3   NON FORMULARY Pt uses a cpap nightly     valACYclovir  (VALTREX ) 1000 MG tablet Take 1,000 mg by mouth 2 (two) times daily as needed (fever blisters).     budesonide-glycopyrrolate -formoterol  (BREZTRI  AEROSPHERE) 160-9-4.8 MCG/ACT AERO inhaler Inhale 2 puffs into the lungs in the morning and at bedtime. with spacer and rinse mouth afterwards. 10.7 g 5   No current facility-administered medications for this visit.   Allergies: Allergies  Allergen Reactions   Tape     Removed a layer of skin when removed, please use paper tape   Ace Inhibitors Cough   Penicillins Rash   I reviewed her past medical history, social history, family history, and environmental history and no significant changes have been reported from her previous visit.  Review of Systems  Constitutional:  Negative for appetite change, chills, fever and unexpected weight change.  HENT:  Negative for congestion and rhinorrhea.   Eyes:  Negative for itching.  Respiratory:  Negative for cough, chest tightness, shortness of breath and wheezing.   Gastrointestinal:  Negative for abdominal pain.  Skin:  Negative for rash.  Allergic/Immunologic: Positive for environmental allergies.  Neurological:  Negative for headaches.    Objective: BP 138/80 (BP Location: Right Arm, Patient Position: Sitting, Cuff Size: Normal)   Pulse 88   Temp 98.7 F (37.1 C) (Temporal)   Resp 18   Ht 5' 3 (1.6 m)   Wt 201 lb 11.2 oz (91.5 kg)   SpO2 99%   BMI 35.73 kg/m  Body mass index is 35.73 kg/m. Physical Exam Vitals and nursing note reviewed.  Constitutional:      Appearance: Normal appearance. She is well-developed.  HENT:     Head: Normocephalic and atraumatic.     Right Ear: External ear normal.     Left  Ear: External ear normal.     Nose: Nose normal.     Mouth/Throat:     Mouth: Mucous membranes are moist.     Pharynx: Oropharynx is clear.  Eyes:     Conjunctiva/sclera: Conjunctivae normal.  Cardiovascular:     Rate and Rhythm: Normal rate and regular rhythm.     Heart sounds: Normal heart sounds. No murmur heard. Pulmonary:     Effort: Pulmonary effort is normal.     Breath sounds: Normal breath sounds. No wheezing, rhonchi or rales.  Musculoskeletal:     Cervical back: Neck supple.  Skin:    General: Skin is warm.     Findings: No rash.  Neurological:     Mental Status: She is alert and oriented to person, place, and time.  Psychiatric:        Behavior: Behavior normal.    Previous notes and tests were reviewed. The plan was reviewed with the patient/family, and all questions/concerned were addressed.  It was my pleasure to see Crystal [name] today and participate in her care. Please feel free to contact me with any questions or concerns.  Sincerely,  Orlan Cramp, DO Allergy & Immunology  [facility]  [facility] office: [phone] [facility] office: [phone]

## 2024-06-30 ENCOUNTER — Other Ambulatory Visit: Payer: Self-pay

## 2024-06-30 DIAGNOSIS — M810 Age-related osteoporosis without current pathological fracture: Secondary | ICD-10-CM

## 2024-06-30 MED ORDER — DENOSUMAB 60 MG/ML ~~LOC~~ SOSY
60.0000 mg | PREFILLED_SYRINGE | Freq: Once | SUBCUTANEOUS | Status: AC
  Administered 2024-07-13: 60 mg via SUBCUTANEOUS

## 2024-06-30 NOTE — Telephone Encounter (Signed)
 Order has been placed. Still awaiting prior authorization info for this med

## 2024-07-01 ENCOUNTER — Telehealth: Payer: Self-pay

## 2024-07-01 NOTE — Telephone Encounter (Signed)
 Prolia  VOB initiated via [email or web address]  Next Prolia  inj DUE: 08/05/24

## 2024-07-04 ENCOUNTER — Other Ambulatory Visit (HOSPITAL_COMMUNITY): Payer: Self-pay

## 2024-07-04 NOTE — Telephone Encounter (Signed)
 SABRA

## 2024-07-04 NOTE — Telephone Encounter (Signed)
 Pt ready for scheduling for PROLIA  on or after : 08/05/24  Option# 1: Buy/Bill (Office supplied medication)  Out-of-pocket cost due at time of clinic visit: $347  Number of injection/visits approved: 2  Primary: [employer] Prolia  co-insurance: 20% Admin fee co-insurance: $15  Secondary: --- Prolia  co-insurance:  Admin fee co-insurance:   Medical Benefit Details: Date Benefits were checked: 07/04/24 Deductible: NO/ Coinsurance: 20%/ Admin Fee: $15  Prior Auth: APPROVED PA# [id number] Expiration Date: 10/21/23-10/19/24   # of doses approved: 2 ----------------------------------------------------------------------- Option# 2- Med Obtained from pharmacy:  Pharmacy benefit: Copay $793.31 (Paid to pharmacy) Admin Fee: $15 (Pay at clinic)  Prior Auth: N/A PA# Expiration Date:   # of doses approved:   If patient wants fill through the pharmacy benefit please send prescription to: [facility], and include estimated need by date in rx notes. Pharmacy will ship medication directly to the office.  Patient NOT eligible for Prolia  Copay Card. Copay Card can make patient's cost as little as $25. Link to apply: [email or web address]://[email or web address]  ** This summary of benefits is an estimation of the patient's out-of-pocket cost. Exact cost may very based on individual plan coverage.

## 2024-07-06 DIAGNOSIS — H25813 Combined forms of age-related cataract, bilateral: Secondary | ICD-10-CM | POA: Diagnosis not present

## 2024-07-07 NOTE — Telephone Encounter (Signed)
 Patient has since been scheduled.

## 2024-07-13 ENCOUNTER — Ambulatory Visit (INDEPENDENT_AMBULATORY_CARE_PROVIDER_SITE_OTHER)

## 2024-07-13 DIAGNOSIS — M810 Age-related osteoporosis without current pathological fracture: Secondary | ICD-10-CM

## 2024-07-13 MED ORDER — DENOSUMAB 60 MG/ML ~~LOC~~ SOSY: 60.0000 mg | PREFILLED_SYRINGE | SUBCUTANEOUS | Status: AC

## 2024-07-13 NOTE — Progress Notes (Signed)
 Patient received prolia  injection today without complication

## 2024-07-20 ENCOUNTER — Ambulatory Visit (HOSPITAL_COMMUNITY)
Admission: RE | Admit: 2024-07-20 | Discharge: 2024-07-20 | Disposition: A | Discharge status: Home / Self Care | Source: Ambulatory Visit | Attending: Acute Care | Admitting: Acute Care

## 2024-07-20 DIAGNOSIS — Z122 Encounter for screening for malignant neoplasm of respiratory organs: Secondary | ICD-10-CM | POA: Insufficient documentation

## 2024-07-20 DIAGNOSIS — F17210 Nicotine dependence, cigarettes, uncomplicated: Secondary | ICD-10-CM | POA: Insufficient documentation

## 2024-07-20 DIAGNOSIS — Z87891 Personal history of nicotine dependence: Secondary | ICD-10-CM | POA: Diagnosis not present

## 2024-07-26 ENCOUNTER — Other Ambulatory Visit: Payer: Self-pay

## 2024-07-26 DIAGNOSIS — Z122 Encounter for screening for malignant neoplasm of respiratory organs: Secondary | ICD-10-CM

## 2024-07-26 DIAGNOSIS — Z87891 Personal history of nicotine dependence: Secondary | ICD-10-CM

## 2024-08-19 DIAGNOSIS — H25811 Combined forms of age-related cataract, right eye: Secondary | ICD-10-CM | POA: Diagnosis not present

## 2024-08-19 DIAGNOSIS — H5371 Glare sensitivity: Secondary | ICD-10-CM | POA: Diagnosis not present

## 2024-08-26 DIAGNOSIS — Z7982 Long term (current) use of aspirin: Secondary | ICD-10-CM | POA: Diagnosis not present

## 2024-08-26 DIAGNOSIS — M810 Age-related osteoporosis without current pathological fracture: Secondary | ICD-10-CM | POA: Diagnosis not present

## 2024-08-26 DIAGNOSIS — M1990 Unspecified osteoarthritis, unspecified site: Secondary | ICD-10-CM | POA: Diagnosis not present

## 2024-08-26 DIAGNOSIS — E785 Hyperlipidemia, unspecified: Secondary | ICD-10-CM | POA: Diagnosis not present

## 2024-08-26 DIAGNOSIS — R32 Unspecified urinary incontinence: Secondary | ICD-10-CM | POA: Diagnosis not present

## 2024-08-26 DIAGNOSIS — Z79620 Long term (current) use of immunosuppressive biologic: Secondary | ICD-10-CM | POA: Diagnosis not present

## 2024-08-26 DIAGNOSIS — Z96649 Presence of unspecified artificial hip joint: Secondary | ICD-10-CM | POA: Diagnosis not present

## 2024-08-26 DIAGNOSIS — F419 Anxiety disorder, unspecified: Secondary | ICD-10-CM | POA: Diagnosis not present

## 2024-08-26 DIAGNOSIS — Z8249 Family history of ischemic heart disease and other diseases of the circulatory system: Secondary | ICD-10-CM | POA: Diagnosis not present

## 2024-08-26 DIAGNOSIS — J4489 Other specified chronic obstructive pulmonary disease: Secondary | ICD-10-CM | POA: Diagnosis not present

## 2024-08-26 DIAGNOSIS — I2510 Atherosclerotic heart disease of native coronary artery without angina pectoris: Secondary | ICD-10-CM | POA: Diagnosis not present

## 2024-08-26 DIAGNOSIS — I10 Essential (primary) hypertension: Secondary | ICD-10-CM | POA: Diagnosis not present

## 2024-08-30 ENCOUNTER — Encounter: Payer: Self-pay | Admitting: Allergy

## 2024-08-30 ENCOUNTER — Ambulatory Visit: Admitting: Allergy

## 2024-08-30 VITALS — BP 134/76 | HR 98 | Temp 98.1°F | Resp 18 | Wt 201.8 lb

## 2024-08-30 DIAGNOSIS — H2512 Age-related nuclear cataract, left eye: Secondary | ICD-10-CM | POA: Diagnosis not present

## 2024-08-30 DIAGNOSIS — J4489 Other specified chronic obstructive pulmonary disease: Secondary | ICD-10-CM

## 2024-08-30 DIAGNOSIS — J301 Allergic rhinitis due to pollen: Secondary | ICD-10-CM | POA: Diagnosis not present

## 2024-08-30 DIAGNOSIS — J988 Other specified respiratory disorders: Secondary | ICD-10-CM | POA: Diagnosis not present

## 2024-08-30 DIAGNOSIS — J3089 Other allergic rhinitis: Secondary | ICD-10-CM | POA: Diagnosis not present

## 2024-08-30 MED ORDER — METHYLPREDNISOLONE 4 MG PO TBPK: ORAL_TABLET | ORAL | 0 refills | Status: AC

## 2024-08-30 MED ORDER — DOXYCYCLINE HYCLATE 100 MG PO TABS: 100.0000 mg | ORAL_TABLET | Freq: Two times a day (BID) | ORAL | 0 refills | Status: AC

## 2024-08-30 MED ORDER — BENZONATATE 100 MG PO CAPS: ORAL_CAPSULE | ORAL | 0 refills | Status: AC

## 2024-08-30 NOTE — Patient Instructions (Addendum)
 Respiratory infection Start medrol  pak. Start doxycycline  100mg  twice a day for 10 days. Minimize sun exposure.  May take tessalon  perles 1-2 capsules up to three times a day as needed for coughing.  Breathing Daily controller medication(s):  Continue Breztri  2 puffs once day with spacer and rinse mouth afterwards.  If you find the inhaler too expensive call the office as sometimes we do have samples.  During respiratory infections/flares:  Increase Breztri  to 2 puffs twice a day for 1-2 weeks until your breathing symptoms return to baseline.  Pretreat with albuterol  2 puffs. May use albuterol  rescue inhaler 2 puffs every 4 to 6 hours as needed for shortness of breath, chest tightness, coughing, and wheezing. May use albuterol  rescue inhaler 2 puffs 5 to 15 minutes prior to strenuous physical activities. Monitor frequency of use - if you need to use it more than twice per week on a consistent basis let us  know.  Breathing control goals:  Full participation in all desired activities (may need albuterol  before activity) Albuterol  use two times or less a week on average (not counting use with activity) Cough interfering with sleep two times or less a month Oral steroids no more than once a year No hospitalizations   Allergic rhinitis 2019 skin testing positive to dust mites, grass, trees and borderline to mouse. Continue environmental control measures.  May use over the counter antihistamines such as Zyrtec (cetirizine), Claritin  (loratadine ), Allegra (fexofenadine), or Xyzal (levocetirizine) daily as needed. Nasal saline spray (i.e., Simply Saline) or nasal saline lavage (i.e., NeilMed) is recommended as needed and prior to medicated nasal sprays.  Follow up in 6 months or sooner if needed.   Drink plenty of fluids. Water , juice, clear broth or warm lemon water  are good choices. Avoid caffeine and alcohol, which can dehydrate you. Eat chicken soup. Chicken soup and other warm fluids  can be soothing and loosen congestion. Rest. Adjust your room's temperature and humidity. Keep your room warm but not overheated. If the air is dry, a cool-mist humidifier or vaporizer can moisten the air and help ease congestion and coughing. Keep the humidifier clean to prevent the growth of bacteria and molds. Soothe your throat. Perform a saltwater gargle. Dissolve one-quarter to a half teaspoon of salt in a 4- to 8-ounce glass of warm water . This can relieve a sore or scratchy throat temporarily. Use saline nasal drops. To help relieve nasal congestion, try saline nasal drops. You can buy these drops over the counter, and they can help relieve symptoms ? even in children. Take over-the-counter cold and cough medications. For adults and children older than 5, over-the-counter decongestants, antihistamines and pain relievers might offer some symptom relief. However, they won't prevent a cold or shorten its duration.

## 2024-08-30 NOTE — Progress Notes (Signed)
 Follow Up Note  RE: Crystal [name] MRN: [id number] DOB: May 05, 1951 Date of Office Visit: 08/30/2024  Referring provider: Norleen Lynwood ORN, MD Primary care provider: Norleen Lynwood ORN, MD  Chief Complaint: Follow-up (Sinus congestion, cough x 3 nights. Increased breztri  to 2 puffs for 2 weeks no improvement)  History of Present Illness: I had the pleasure of seeing Crystal [name] for a follow up visit at the [facility] on 08/30/2024. She is a 73 y.o. female, who is being followed for COPD with asthma and allergic rhinitis. Her previous allergy office visit was on 06/29/2024 with Dr. Luke. Today is a new complaint visit of nasal congestion.  Discussed the use of AI scribe software for clinical note transcription with the patient, who gave verbal consent to proceed.    Approximately two weeks ago, she developed a scratchy throat and runny nose with significant watery nasal discharge. Over the past week, her symptoms progressed to a productive cough with clear phlegm, which has since become a croupy and dry cough, worsening over the weekend. She experiences difficulty sleeping due to persistent coughing, which is exacerbated when lying down with her CPAP machine. No fever has been reported throughout the course of her illness. She continues to experience some sinus drainage, though less than initially.  She has been using Breztri , two puffs twice a day, and has increased its use since the onset of her symptoms. She has not used her rescue inhaler as she is not attending water  fitness due to recent cataract surgery. She underwent cataract surgery two weeks ago and is scheduled for another procedure this Friday, pending her respiratory condition. She is currently taking Claritin  for allergies but is unsure if she should switch to a different medication. She prefers saline rinses over nasal sprays, which she finds too drying.  She has not taken antibiotics or steroids recently. She is  allergic to penicillin and has found Z-Pak ineffective in the past. She recalls taking clindamycin for dental work and an infection from a building surveyor. She has not taken doxycycline  before. She mentions her ears clicking and notes that her household members are also experiencing illness. She has a history of undergoing annual lung CT scans as a former smoker.      07/25/2024 CT chest: IMPRESSION: 1. Lung-RADS 1, negative. Continue annual screening with low-dose chest CT without contrast in 12 months. 2. One vessel coronary atherosclerosis. 3. Nonobstructing right nephrolithiasis. 4. Aortic Atherosclerosis (ICD10-I70.0) and Emphysema (ICD10-J43.9).  Assessment and Plan: Crystal [name] is a 73 y.o. female with: Respiratory infection Acute respiratory infection with productive cough, clear phlegm, and croupy sound x few weeks. Doxycycline  chosen due to penicillin allergy and past ineffectiveness of other antibiotics. Start medrol  pak. Start doxycycline  100mg  twice a day for 10 days. Minimize sun exposure.  May take tessalon  perles 1-2 capsules up to three times a day as needed for coughing.  COPD with asthma Past history - Was doing well until June and required oral prednisone  and IM steroid. Breathing is not back to baseline. Quit smoking.  2023 spirometry showed 10% improvement in FEV1 post bronchodilator treatment. Clinically feeling improved. 2023 CT chest done. Interim history - managed with Breztri . Increased use of Breztri  due to recent respiratory symptoms. Daily controller medication(s):  Continue Breztri  2 puffs once day with spacer and rinse mouth afterwards.  If you find the inhaler too expensive call the office as sometimes we do have samples.  During respiratory infections/flares:  Increase Breztri  to  2 puffs twice a day for 1-2 weeks until your breathing symptoms return to baseline.  Pretreat with albuterol  2 puffs. May use albuterol  rescue inhaler 2 puffs every 4 to 6 hours as  needed for shortness of breath, chest tightness, coughing, and wheezing. May use albuterol  rescue inhaler 2 puffs 5 to 15 minutes prior to strenuous physical activities. Monitor frequency of use - if you need to use it more than twice per week on a consistent basis let us  know.  Get spirometry at next visit.   Allergic rhinitis due to dust mite Seasonal allergic rhinitis due to pollen Past history - 2019 skin testing positive to dust mites, grass, trees and borderline to mouse. [name]  daily use caused too much dryness.  Interim history - Chronic allergic rhinitis managed with Claritin . Considering switching to another antihistamine due to long-term use. Continue environmental control measures.  May use over the counter antihistamines such as Zyrtec (cetirizine), Claritin  (loratadine ), Allegra (fexofenadine), or Xyzal (levocetirizine) daily as needed. Nasal saline spray (i.e., Simply Saline) or nasal saline lavage (i.e., NeilMed) is recommended as needed and prior to medicated nasal sprays.  Return in about 6 months (around 02/27/2025).  Meds ordered this encounter  Medications   methylPREDNISolone  (MEDROL  DOSEPAK) 4 MG TBPK tablet    Sig: Take 6 tablets on day 1, 5 tablets on day 2, 4 tabs on day 3, 3 tabs on day 4, 2 tabs on day 5, 1 tab on day 6.    Dispense:  21 tablet    Refill:  0   doxycycline  (VIBRA -TABS) 100 MG tablet    Sig: Take 1 tablet (100 mg total) by mouth 2 (two) times daily.    Dispense:  20 tablet    Refill:  0   benzonatate  (TESSALON ) 100 MG capsule    Sig: Take 1-2 capsules as needed for coughing three times a day.    Dispense:  45 capsule    Refill:  0   Lab Orders  No laboratory test(s) ordered today    Diagnostics: None.   Medication List:  Current Outpatient Medications  Medication Sig Dispense Refill   benzonatate  (TESSALON ) 100 MG capsule Take 1-2 capsules as needed for coughing three times a day. 45 capsule 0   doxycycline  (VIBRA -TABS) 100 MG tablet  Take 1 tablet (100 mg total) by mouth 2 (two) times daily. 20 tablet 0   methylPREDNISolone  (MEDROL  DOSEPAK) 4 MG TBPK tablet Take 6 tablets on day 1, 5 tablets on day 2, 4 tabs on day 3, 3 tabs on day 4, 2 tabs on day 5, 1 tab on day 6. 21 tablet 0   albuterol  (VENTOLIN  HFA) 108 (90 Base) MCG/ACT inhaler Inhale 2 puffs into the lungs every 4 (four) hours as needed for wheezing or shortness of breath (coughing fits). 18 g 1   aspirin  EC 81 MG tablet Take 1 tablet (81 mg total) by mouth 2 (two) times daily. For 2 weeks then once a day for 2 weeks after that. (Patient taking differently: Take 81 mg by mouth daily.) 45 tablet 0   atorvastatin  (LIPITOR) 40 MG tablet Take 1 tablet (40 mg total) by mouth daily. 90 tablet 3   budesonide-glycopyrrolate -formoterol  (BREZTRI  AEROSPHERE) 160-9-4.8 MCG/ACT AERO inhaler Inhale 2 puffs into the lungs in the morning and at bedtime. with spacer and rinse mouth afterwards. 10.7 g 5   Calcium  Carb-Cholecalciferol 600-800 MG-UNIT TABS Take 1 tablet by mouth daily.     Cholecalciferol (VITAMIN D  PO) Take 2,000 Units  by mouth daily.      denosumab  (PROLIA ) 60 MG/ML SOSY injection INJECT 1 SYRINGE UNDER THE SKIN ONCE EVERY 6 MONTHS 180 mL 0   folic acid  (FOLVITE ) 400 MCG tablet Take 400 mcg by mouth daily.     loratadine  (CLARITIN ) 10 MG tablet Take 10 mg by mouth daily as needed for allergies.     losartan  (COZAAR ) 50 MG tablet TAKE 1 TABLET EVERY DAY (ANNUAL APPOINTMENT DUE IN MAY, MUST SEE MD FOR FUTURE REFILLS) 90 tablet 3   metoprolol  succinate (TOPROL -XL) 25 MG 24 hr tablet TAKE 1 TABLET BY MOUTH DAILY. ANNUAL APPT IS DUE W/LABS MUST SEE PROVIDER FOR FUTURE REFILLS 90 tablet 3   NON FORMULARY Pt uses a cpap nightly     valACYclovir  (VALTREX ) 1000 MG tablet Take 1,000 mg by mouth 2 (two) times daily as needed (fever blisters).     Current Facility-Administered Medications  Medication Dose Route Frequency Provider Last Rate Last Admin   [START ON 01/10/2025] denosumab   (PROLIA ) injection 60 mg  60 mg Subcutaneous Q6 months Norleen Lynwood ORN, MD       Allergies: Allergies  Allergen Reactions   Tape     Removed a layer of skin when removed, please use paper tape   Ace Inhibitors Cough   Penicillins Rash   I reviewed her past medical history, social history, family history, and environmental history and no significant changes have been reported from her previous visit.  Review of Systems  Constitutional:  Negative for appetite change, chills, fever and unexpected weight change.  HENT:  Positive for postnasal drip, rhinorrhea and sore throat.   Eyes:  Negative for itching.  Respiratory:  Positive for cough. Negative for chest tightness, shortness of breath and wheezing.   Gastrointestinal:  Negative for abdominal pain.  Skin:  Negative for rash.  Allergic/Immunologic: Positive for environmental allergies.  Neurological:  Negative for headaches.    Objective: BP 134/76   Pulse 98   Temp 98.1 F (36.7 C) (Temporal)   Resp 18   Wt 201 lb 12.8 oz (91.5 kg)   SpO2 97%   BMI 35.75 kg/m  Body mass index is 35.75 kg/m. Physical Exam Vitals and nursing note reviewed.  Constitutional:      Appearance: Normal appearance. She is well-developed.  HENT:     Head: Normocephalic and atraumatic.     Right Ear: External ear normal.     Left Ear: External ear normal.     Nose: Nose normal.     Mouth/Throat:     Mouth: Mucous membranes are moist.     Pharynx: Oropharynx is clear.  Eyes:     Conjunctiva/sclera: Conjunctivae normal.  Cardiovascular:     Rate and Rhythm: Normal rate and regular rhythm.     Heart sounds: Normal heart sounds. No murmur heard. Pulmonary:     Effort: Pulmonary effort is normal.     Breath sounds: Normal breath sounds. No wheezing, rhonchi or rales.  Musculoskeletal:     Cervical back: Neck supple.  Skin:    General: Skin is warm.     Findings: No rash.  Neurological:     Mental Status: She is alert and oriented to person,  place, and time.  Psychiatric:        Behavior: Behavior normal.    Previous notes and tests were reviewed. The plan was reviewed with the patient/family, and all questions/concerned were addressed.  It was my pleasure to see Crystal [name] today and participate in  her care. Please feel free to contact me with any questions or concerns.  Sincerely,  Orlan Cramp, DO Allergy & Immunology  [facility]  [place] office: [phone] [facility] office: [phone]

## 2024-08-31 DIAGNOSIS — Z96641 Presence of right artificial hip joint: Secondary | ICD-10-CM | POA: Diagnosis not present

## 2024-08-31 DIAGNOSIS — Z471 Aftercare following joint replacement surgery: Secondary | ICD-10-CM | POA: Diagnosis not present

## 2024-09-02 DIAGNOSIS — H25812 Combined forms of age-related cataract, left eye: Secondary | ICD-10-CM | POA: Diagnosis not present

## 2024-09-02 DIAGNOSIS — H5371 Glare sensitivity: Secondary | ICD-10-CM | POA: Diagnosis not present

## 2024-09-14 ENCOUNTER — Encounter: Payer: Self-pay | Admitting: Pharmacist

## 2024-09-14 NOTE — Progress Notes (Signed)
 Pharmacy Quality Measure Review  This patient is appearing on a report for being at risk of failing the adherence measure for cholesterol (statin) medications this calendar year.   Medication: Atorvastatin  Last fill date: 08/16/24 for 90 day supply  Insurance report was not up to date. No action needed at this time.   Darrelyn Drum, PharmD, BCPS, CPP Clinical Pharmacist Practitioner [facility] [phone]

## 2024-11-01 ENCOUNTER — Telehealth: Payer: Self-pay

## 2024-11-01 NOTE — Telephone Encounter (Signed)
 Copied from CRM [id number]. Topic: Appointments - Scheduling Inquiry for Clinic >> Nov 01, 2024 11:17 AM Adelita E wrote: Reason for CRM: Patient is trying to find a new provider as current PCP will be retiring. Relayed providers that are accepting new patients, but patient would prefer an MD. She has seen Dr. Rollene in the past and really hoping this provider would be willing to take her on as a patient. Please call patient if this is doable.

## 2024-11-02 NOTE — Telephone Encounter (Signed)
 Spoke with pt and she is aware.

## 2024-11-02 NOTE — Telephone Encounter (Signed)
 Unable to take on TOC now sorry

## 2024-12-12 ENCOUNTER — Encounter: Admitting: Family Medicine

## 2024-12-21 ENCOUNTER — Encounter

## 2025-02-27 ENCOUNTER — Ambulatory Visit: Admitting: Allergy

## 2025-04-27 ENCOUNTER — Ambulatory Visit
